# Patient Record
Sex: Female | Born: 1955 | Hispanic: No | Marital: Married | Smoking: Current every day smoker
Health system: Southern US, Community
[De-identification: ages and names within clinical notes are randomized; demographics above are authoritative.]

## PROBLEM LIST (undated history)

## (undated) DIAGNOSIS — K219 Gastro-esophageal reflux disease without esophagitis: Secondary | ICD-10-CM

## (undated) DIAGNOSIS — F32A Depression, unspecified: Secondary | ICD-10-CM

## (undated) DIAGNOSIS — D219 Benign neoplasm of connective and other soft tissue, unspecified: Secondary | ICD-10-CM

## (undated) DIAGNOSIS — F329 Major depressive disorder, single episode, unspecified: Secondary | ICD-10-CM

## (undated) DIAGNOSIS — C50919 Malignant neoplasm of unspecified site of unspecified female breast: Secondary | ICD-10-CM

## (undated) DIAGNOSIS — F419 Anxiety disorder, unspecified: Secondary | ICD-10-CM

## (undated) DIAGNOSIS — F17200 Nicotine dependence, unspecified, uncomplicated: Secondary | ICD-10-CM

## (undated) DIAGNOSIS — R112 Nausea with vomiting, unspecified: Secondary | ICD-10-CM

## (undated) DIAGNOSIS — Z9889 Other specified postprocedural states: Secondary | ICD-10-CM

## (undated) HISTORY — DX: Depression, unspecified: F32.A

## (undated) HISTORY — PX: MASTECTOMY: SHX3

## (undated) HISTORY — DX: Nicotine dependence, unspecified, uncomplicated: F17.200

## (undated) HISTORY — DX: Benign neoplasm of connective and other soft tissue, unspecified: D21.9

## (undated) HISTORY — DX: Major depressive disorder, single episode, unspecified: F32.9

---

## 1997-12-05 ENCOUNTER — Other Ambulatory Visit: Admission: RE | Admit: 1997-12-05 | Discharge: 1997-12-05 | Payer: Self-pay | Admitting: Obstetrics and Gynecology

## 1999-09-20 ENCOUNTER — Other Ambulatory Visit: Admission: RE | Admit: 1999-09-20 | Discharge: 1999-09-20 | Payer: Self-pay | Admitting: Obstetrics and Gynecology

## 1999-10-19 ENCOUNTER — Other Ambulatory Visit: Admission: RE | Admit: 1999-10-19 | Discharge: 1999-10-19 | Payer: Self-pay | Admitting: Obstetrics and Gynecology

## 2001-01-24 ENCOUNTER — Other Ambulatory Visit: Admission: RE | Admit: 2001-01-24 | Discharge: 2001-01-24 | Payer: Self-pay | Admitting: Obstetrics and Gynecology

## 2002-02-27 ENCOUNTER — Other Ambulatory Visit: Admission: RE | Admit: 2002-02-27 | Discharge: 2002-02-27 | Payer: Self-pay | Admitting: Obstetrics and Gynecology

## 2003-03-10 ENCOUNTER — Other Ambulatory Visit: Admission: RE | Admit: 2003-03-10 | Discharge: 2003-03-10 | Payer: Self-pay | Admitting: Obstetrics and Gynecology

## 2003-11-06 ENCOUNTER — Ambulatory Visit: Payer: Self-pay | Admitting: Professional

## 2003-12-04 ENCOUNTER — Ambulatory Visit: Payer: Self-pay | Admitting: Professional

## 2003-12-18 ENCOUNTER — Ambulatory Visit: Payer: Self-pay | Admitting: Professional

## 2004-01-08 ENCOUNTER — Ambulatory Visit: Payer: Self-pay | Admitting: Professional

## 2004-01-22 ENCOUNTER — Ambulatory Visit: Payer: Self-pay | Admitting: Professional

## 2004-02-05 ENCOUNTER — Ambulatory Visit: Payer: Self-pay | Admitting: Professional

## 2004-04-28 ENCOUNTER — Other Ambulatory Visit: Admission: RE | Admit: 2004-04-28 | Discharge: 2004-04-28 | Payer: Self-pay | Admitting: Obstetrics and Gynecology

## 2005-05-18 ENCOUNTER — Other Ambulatory Visit: Admission: RE | Admit: 2005-05-18 | Discharge: 2005-05-18 | Payer: Self-pay | Admitting: Obstetrics and Gynecology

## 2006-06-20 ENCOUNTER — Other Ambulatory Visit: Admission: RE | Admit: 2006-06-20 | Discharge: 2006-06-20 | Payer: Self-pay | Admitting: Obstetrics and Gynecology

## 2007-09-04 ENCOUNTER — Other Ambulatory Visit: Admission: RE | Admit: 2007-09-04 | Discharge: 2007-09-04 | Payer: Self-pay | Admitting: Obstetrics and Gynecology

## 2007-09-04 ENCOUNTER — Encounter: Payer: Self-pay | Admitting: Obstetrics and Gynecology

## 2007-09-04 ENCOUNTER — Ambulatory Visit: Payer: Self-pay | Admitting: Obstetrics and Gynecology

## 2007-12-19 ENCOUNTER — Ambulatory Visit: Payer: Self-pay | Admitting: Obstetrics and Gynecology

## 2008-11-13 ENCOUNTER — Ambulatory Visit: Payer: Self-pay | Admitting: Obstetrics and Gynecology

## 2008-11-13 ENCOUNTER — Encounter: Payer: Self-pay | Admitting: Obstetrics and Gynecology

## 2008-11-13 ENCOUNTER — Other Ambulatory Visit: Admission: RE | Admit: 2008-11-13 | Discharge: 2008-11-13 | Payer: Self-pay | Admitting: Obstetrics and Gynecology

## 2010-01-27 ENCOUNTER — Other Ambulatory Visit
Admission: RE | Admit: 2010-01-27 | Discharge: 2010-01-27 | Payer: Self-pay | Source: Home / Self Care | Admitting: Obstetrics and Gynecology

## 2010-01-27 ENCOUNTER — Other Ambulatory Visit: Payer: Self-pay | Admitting: Obstetrics and Gynecology

## 2010-01-27 ENCOUNTER — Ambulatory Visit
Admission: RE | Admit: 2010-01-27 | Discharge: 2010-01-27 | Payer: Self-pay | Source: Home / Self Care | Attending: Obstetrics and Gynecology | Admitting: Obstetrics and Gynecology

## 2010-02-03 ENCOUNTER — Other Ambulatory Visit: Payer: Self-pay

## 2010-02-09 ENCOUNTER — Other Ambulatory Visit: Payer: Self-pay

## 2010-02-12 ENCOUNTER — Other Ambulatory Visit: Payer: PRIVATE HEALTH INSURANCE

## 2010-02-12 DIAGNOSIS — R7309 Other abnormal glucose: Secondary | ICD-10-CM

## 2010-03-03 ENCOUNTER — Other Ambulatory Visit: Payer: Self-pay | Admitting: Obstetrics and Gynecology

## 2010-03-03 DIAGNOSIS — Z1231 Encounter for screening mammogram for malignant neoplasm of breast: Secondary | ICD-10-CM

## 2010-03-10 ENCOUNTER — Encounter (INDEPENDENT_AMBULATORY_CARE_PROVIDER_SITE_OTHER): Payer: PRIVATE HEALTH INSURANCE

## 2010-03-10 ENCOUNTER — Ambulatory Visit (HOSPITAL_COMMUNITY)
Admission: RE | Admit: 2010-03-10 | Discharge: 2010-03-10 | Disposition: A | Discharge status: Home / Self Care | Payer: PRIVATE HEALTH INSURANCE | Source: Ambulatory Visit | Attending: Obstetrics and Gynecology | Admitting: Obstetrics and Gynecology

## 2010-03-10 DIAGNOSIS — Z13820 Encounter for screening for osteoporosis: Secondary | ICD-10-CM

## 2010-03-10 DIAGNOSIS — Z1231 Encounter for screening mammogram for malignant neoplasm of breast: Secondary | ICD-10-CM | POA: Insufficient documentation

## 2010-12-30 ENCOUNTER — Telehealth: Payer: Self-pay | Admitting: *Deleted

## 2010-12-30 NOTE — Telephone Encounter (Signed)
prestiq approved till 09/18/13

## 2011-02-22 DIAGNOSIS — F17200 Nicotine dependence, unspecified, uncomplicated: Secondary | ICD-10-CM | POA: Insufficient documentation

## 2011-02-22 DIAGNOSIS — D219 Benign neoplasm of connective and other soft tissue, unspecified: Secondary | ICD-10-CM | POA: Insufficient documentation

## 2011-02-22 DIAGNOSIS — F329 Major depressive disorder, single episode, unspecified: Secondary | ICD-10-CM | POA: Insufficient documentation

## 2011-02-22 DIAGNOSIS — F32A Depression, unspecified: Secondary | ICD-10-CM | POA: Insufficient documentation

## 2011-02-27 ENCOUNTER — Other Ambulatory Visit: Payer: Self-pay | Admitting: Obstetrics and Gynecology

## 2011-02-28 NOTE — Telephone Encounter (Signed)
Has CE scheduled 03/02/11 

## 2011-03-02 ENCOUNTER — Ambulatory Visit (INDEPENDENT_AMBULATORY_CARE_PROVIDER_SITE_OTHER): Payer: BC Managed Care – PPO | Admitting: Obstetrics and Gynecology

## 2011-03-02 ENCOUNTER — Encounter: Payer: Self-pay | Admitting: Obstetrics and Gynecology

## 2011-03-02 ENCOUNTER — Other Ambulatory Visit (HOSPITAL_COMMUNITY)
Admission: RE | Admit: 2011-03-02 | Discharge: 2011-03-02 | Disposition: A | Discharge status: Home / Self Care | Payer: BC Managed Care – PPO | Source: Ambulatory Visit | Attending: Obstetrics and Gynecology | Admitting: Obstetrics and Gynecology

## 2011-03-02 VITALS — BP 120/76 | Ht 66.5 in | Wt 226.0 lb

## 2011-03-02 DIAGNOSIS — N898 Other specified noninflammatory disorders of vagina: Secondary | ICD-10-CM

## 2011-03-02 DIAGNOSIS — Z01419 Encounter for gynecological examination (general) (routine) without abnormal findings: Secondary | ICD-10-CM

## 2011-03-02 DIAGNOSIS — Z833 Family history of diabetes mellitus: Secondary | ICD-10-CM

## 2011-03-02 DIAGNOSIS — Z30431 Encounter for routine checking of intrauterine contraceptive device: Secondary | ICD-10-CM

## 2011-03-02 DIAGNOSIS — E7800 Pure hypercholesterolemia, unspecified: Secondary | ICD-10-CM

## 2011-03-02 DIAGNOSIS — L293 Anogenital pruritus, unspecified: Secondary | ICD-10-CM

## 2011-03-02 LAB — WET PREP FOR TRICH, YEAST, CLUE
Trich, Wet Prep: NONE SEEN
Yeast Wet Prep HPF POC: NONE SEEN

## 2011-03-02 MED ORDER — DESVENLAFAXINE SUCCINATE ER 50 MG PO TB24: 50.0000 mg | ORAL_TABLET | Freq: Every day | ORAL | Status: DC

## 2011-03-02 MED ORDER — FLUCONAZOLE 150 MG PO TABS: 150.0000 mg | ORAL_TABLET | Freq: Once | ORAL | Status: AC

## 2011-03-02 MED ORDER — ESTRADIOL 1 MG PO TABS: 1.0000 mg | ORAL_TABLET | Freq: Every day | ORAL | Status: DC

## 2011-03-02 NOTE — Patient Instructions (Signed)
Return fasting her lab work.

## 2011-03-02 NOTE — Progress Notes (Signed)
Patient came to see me today for Sabrina Woods annual GYN exam. She has done well on Sabrina Woods hormone replacement without problems. She does not take progesterone because she has a Mirena IUD. She is having no cycles. Sabrina Woods IUD is due to be replaced in July. She is having no pelvic pain. Pristiq  is doing well for Sabrina Woods depression without any side effects. She is up-to-date on mammograms and bone densities. It is time to recheck Sabrina Woods cholesterol. She did not fast. Last year Sabrina Woods LDL was slightly elevated.  Physical examination: [name] present. HEENT within normal limits. Neck: Thyroid not large. No masses. Supraclavicular nodes: not enlarged. Breasts: Examined in both sitting midline position. No skin changes and no masses. Abdomen: Soft no guarding rebound or masses or hernia. Pelvic: External: Within normal limits. BUS: Within normal limits. Vaginal:within normal limits. Good estrogen effect. No evidence of cystocele rectocele or enterocele. Cervix: clean. IUD string visible. Uterus: Normal size and shape. Adnexa: No masses. Rectovaginal exam: Confirmatory and negative. Extremities: Within normal limits.  Wet prep negative but ooks like yeast.  Assessment: #1. Menopausal symptoms #2. Menorrhagia prior to IUD. #3. Yeast vaginitis #4. Depression  Plan: #1. Continue pristiq. #2. Continue estradiol #3. Get patient appproved for a new IUD #4. Diflucan 150 mg daily for 3 days. #4. Continue yearly mammograms #5. Return fasting for lab work.

## 2011-03-03 ENCOUNTER — Other Ambulatory Visit: Payer: Self-pay | Admitting: *Deleted

## 2011-03-03 ENCOUNTER — Telehealth: Payer: Self-pay | Admitting: *Deleted

## 2011-03-03 DIAGNOSIS — Z3049 Encounter for surveillance of other contraceptives: Secondary | ICD-10-CM

## 2011-03-03 LAB — URINALYSIS W MICROSCOPIC + REFLEX CULTURE
Bacteria, UA: NONE SEEN
Bilirubin Urine: NEGATIVE
Casts: NONE SEEN
Glucose, UA: NEGATIVE mg/dL
Hgb urine dipstick: NEGATIVE
Ketones, ur: NEGATIVE mg/dL
Protein, ur: NEGATIVE mg/dL
RBC / HPF: NONE SEEN RBC/hpf (ref <3)
WBC, UA: NONE SEEN WBC/hpf (ref <3)
pH: 6.5 (ref 5.0–8.0)

## 2011-03-03 MED ORDER — LEVONORGESTREL 20 MCG/24HR IU IUD: INTRAUTERINE_SYSTEM | Freq: Once | INTRAUTERINE | Status: DC

## 2011-03-03 NOTE — Telephone Encounter (Signed)
Lm for patient to call.  Benefits Mirena $25 copay

## 2011-03-03 NOTE — Telephone Encounter (Signed)
Patient informed, will schedule

## 2011-03-03 NOTE — Telephone Encounter (Signed)
Message copied by Libby Maw on Thu Mar 03, 2011  9:31 AM ------      Message from: Trellis Paganini      Created: Wed Mar 02, 2011  4:10 PM       Mirena IUD      ----- Message -----         From: Otto Herb, CNA         Sent: 03/02/2011   3:31 PM           To: Trellis Paganini, MD            Dr Reece Agar, you didn't state which IUD.  I see she has been on Mirena.  Would you like to stay on Mirena?      ----- Message -----         From: Trellis Paganini, MD         Sent: 03/02/2011   2:53 PM           To: Blanca Carreon Duwaine Maxin, CNA            Patient has an IUD that she uses for menorrhagia. It is time to change it. Please get her approved

## 2011-03-09 ENCOUNTER — Other Ambulatory Visit: Payer: BC Managed Care – PPO

## 2011-03-09 DIAGNOSIS — Z833 Family history of diabetes mellitus: Secondary | ICD-10-CM

## 2011-03-09 DIAGNOSIS — E7800 Pure hypercholesterolemia, unspecified: Secondary | ICD-10-CM

## 2011-03-09 DIAGNOSIS — Z01419 Encounter for gynecological examination (general) (routine) without abnormal findings: Secondary | ICD-10-CM

## 2011-03-09 LAB — CBC WITH DIFFERENTIAL/PLATELET
Eosinophils Relative: 2 % (ref 0–5)
HCT: 44 % (ref 36.0–46.0)
Hemoglobin: 14.2 g/dL (ref 12.0–15.0)
Lymphocytes Relative: 25 % (ref 12–46)
Lymphs Abs: 2.2 10*3/uL (ref 0.7–4.0)
MCV: 93 fL (ref 78.0–100.0)
Monocytes Absolute: 0.6 10*3/uL (ref 0.1–1.0)
RBC: 4.73 MIL/uL (ref 3.87–5.11)
WBC: 8.9 10*3/uL (ref 4.0–10.5)

## 2011-03-10 ENCOUNTER — Ambulatory Visit (INDEPENDENT_AMBULATORY_CARE_PROVIDER_SITE_OTHER): Payer: BC Managed Care – PPO | Admitting: Obstetrics and Gynecology

## 2011-03-10 DIAGNOSIS — E7800 Pure hypercholesterolemia, unspecified: Secondary | ICD-10-CM

## 2011-03-10 DIAGNOSIS — Z3049 Encounter for surveillance of other contraceptives: Secondary | ICD-10-CM

## 2011-03-10 DIAGNOSIS — N951 Menopausal and female climacteric states: Secondary | ICD-10-CM

## 2011-03-10 DIAGNOSIS — Z780 Asymptomatic menopausal state: Secondary | ICD-10-CM

## 2011-03-10 DIAGNOSIS — Z30432 Encounter for removal of intrauterine contraceptive device: Secondary | ICD-10-CM

## 2011-03-10 MED ORDER — MEDROXYPROGESTERONE ACETATE 5 MG PO TABS: ORAL_TABLET | ORAL | Status: DC

## 2011-03-10 NOTE — Progress Notes (Signed)
Patient came to see me today to have her old IUD removed and a new one placed. She does not feel she is getting adequate relief of her vasomotor symptoms with 1 mg of estradiol daily.  Pelvic exam: External: Within normal limits. BUS within normal limits. Vaginal exam: Within normal limits. Cervix: Clean. IUD string not visible. Os closed. Uterus: Within normal limits. Adnexa: Within normal limits. Rectovaginal exam: Within normal limits. Olegario Shearer present.  Assessment: Menopausal symptoms. Elevated cholesterol. Borderline hemoglobin A1c.  Plan: We could not see her IUD string. I could not probe her os for the string due to cervical stenosis. A paracervical block was placed with 20 cc 1% plain Xylocaine. An excellent paracervical was obtained. I was then able to dilate her cervix. I found the IUD string high in the cervical canal. I removed her IUD without difficulty. At this point she elected not to have a new one placed. She will do cyclical Provera. We'll also increased her estradiol to  11/2 mg daily. If symptoms persist she will return for serum estradiol level. She is aware of the slightly higher risk of breast cancer with long-term use of systemic progestin. We also discussed her cholesterol and hemoglobin A1c results. See notes attached to lab. She will continue to work on low cholesterol low sugar diet. We will recheck her hemoglobin A 1C in 6 months.

## 2011-03-16 ENCOUNTER — Other Ambulatory Visit: Payer: Self-pay

## 2011-03-16 DIAGNOSIS — R7309 Other abnormal glucose: Secondary | ICD-10-CM

## 2011-03-22 ENCOUNTER — Ambulatory Visit: Payer: BC Managed Care – PPO | Admitting: Obstetrics and Gynecology

## 2012-03-10 ENCOUNTER — Other Ambulatory Visit: Payer: Self-pay | Admitting: Obstetrics and Gynecology

## 2013-11-04 ENCOUNTER — Encounter: Payer: Self-pay | Admitting: Obstetrics and Gynecology

## 2020-01-04 DIAGNOSIS — C50919 Malignant neoplasm of unspecified site of unspecified female breast: Secondary | ICD-10-CM

## 2020-01-04 HISTORY — DX: Malignant neoplasm of unspecified site of unspecified female breast: C50.919

## 2020-02-20 ENCOUNTER — Other Ambulatory Visit: Payer: Self-pay | Admitting: Obstetrics and Gynecology

## 2020-02-20 DIAGNOSIS — N6310 Unspecified lump in the right breast, unspecified quadrant: Secondary | ICD-10-CM

## 2020-02-25 ENCOUNTER — Other Ambulatory Visit: Payer: Self-pay | Admitting: Obstetrics and Gynecology

## 2020-02-25 ENCOUNTER — Ambulatory Visit
Admission: RE | Admit: 2020-02-25 | Discharge: 2020-02-25 | Disposition: A | Discharge status: Home / Self Care | Payer: Managed Care, Other (non HMO) | Source: Ambulatory Visit | Attending: Obstetrics and Gynecology | Admitting: Obstetrics and Gynecology

## 2020-02-25 ENCOUNTER — Other Ambulatory Visit: Payer: Self-pay

## 2020-02-25 DIAGNOSIS — N6320 Unspecified lump in the left breast, unspecified quadrant: Secondary | ICD-10-CM

## 2020-02-25 DIAGNOSIS — N6310 Unspecified lump in the right breast, unspecified quadrant: Secondary | ICD-10-CM

## 2020-03-02 ENCOUNTER — Other Ambulatory Visit: Payer: Managed Care, Other (non HMO)

## 2020-03-04 ENCOUNTER — Other Ambulatory Visit: Payer: Self-pay

## 2020-03-04 ENCOUNTER — Ambulatory Visit
Admission: RE | Admit: 2020-03-04 | Discharge: 2020-03-04 | Disposition: A | Discharge status: Home / Self Care | Payer: Managed Care, Other (non HMO) | Source: Ambulatory Visit | Attending: Obstetrics and Gynecology | Admitting: Obstetrics and Gynecology

## 2020-03-04 DIAGNOSIS — N6310 Unspecified lump in the right breast, unspecified quadrant: Secondary | ICD-10-CM

## 2020-03-05 ENCOUNTER — Ambulatory Visit
Admission: RE | Admit: 2020-03-05 | Discharge: 2020-03-05 | Disposition: A | Discharge status: Home / Self Care | Payer: Managed Care, Other (non HMO) | Source: Ambulatory Visit | Attending: Obstetrics and Gynecology | Admitting: Obstetrics and Gynecology

## 2020-03-05 ENCOUNTER — Telehealth: Payer: Self-pay | Admitting: Hematology and Oncology

## 2020-03-05 ENCOUNTER — Other Ambulatory Visit: Payer: Self-pay

## 2020-03-05 DIAGNOSIS — N6310 Unspecified lump in the right breast, unspecified quadrant: Secondary | ICD-10-CM

## 2020-03-05 NOTE — Telephone Encounter (Signed)
Received a new pt referral from [facility] for new dx of breast cancer. Mrs. Sabrina Woods has been cld and scheduled to see Dr. Lindi Adie on 3/8 at 1pm. Pt aware to arrive 20 minutes early.

## 2020-03-09 NOTE — Progress Notes (Signed)
[facility] CONSULT NOTE  Patient Care Team: Bennetta Laos, MD (Inactive) as PCP - General (Obstetrics and Gynecology) Mauro Kaufmann, RN as Oncology Nurse Navigator Rockwell Germany, RN as Oncology Nurse Navigator  CHIEF COMPLAINTS/PURPOSE OF CONSULTATION:  Newly diagnosed breast cancer  HISTORY OF PRESENTING ILLNESS:  Sabrina Woods 65 y.o. female is here because of recent diagnosis of invasive ductal carcinoma of the right breast. Patient palpated a right breast mass for 6 months. Diagnostic mammogram and [place] on 02/25/20 showed a small group of cysts in the left breast at the 3:30 position, 1.2cm, a 3.0cm mass at the 3:00 position favoring a dilated duct, and in the right breast, a 11.0cm mass in the upper outer quadrant and two masses, 2.6cm each, in the right axilla. 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 She presents to the clinic today for initial evaluation and discussion of treatment options.   I reviewed her records extensively and collaborated the history with the patient.  SUMMARY OF ONCOLOGIC HISTORY: Oncology History  Malignant neoplasm of upper-outer quadrant of right breast in female, estrogen receptor positive ([place])  03/10/2020 Initial Diagnosis   Palpable right breast mass x6 months.  Mammogram revealed left breast cysts (intraductal papilloma), 11 cm right breast mass and 2 masses 2.6 cm each in the right axilla: Biopsy grade 3 IDC ER 5% weak, PR 0%, Ki-67 25%, HER-2 3+ IHC positive   03/10/2020 Cancer Staging   Staging form: Breast, AJCC 8th Edition - Clinical stage from 03/10/2020: Stage IIIA (cT3, cN1, cM0, G3, ER+, PR-, HER2+) - Signed by [name], MD on 03/10/2020 Stage prefix: Initial diagnosis     MEDICAL HISTORY:  Past Medical History:  Diagnosis Date  . Depression     HISTORY OF  . Fibroid   . Smoker     SURGICAL HISTORY: Past Surgical History:  Procedure Laterality Date  . CESAREAN SECTION     X 2    SOCIAL HISTORY: Social History   Socioeconomic History  . Marital status: Single    Spouse name: Not on file  . Number of children: Not on file  . Years of education: Not on file  . Highest education level: Not on file  Occupational History  . Not on file  Tobacco Use  . Smoking status: Current Every Day Smoker    Packs/day: 0.50    Types: Cigarettes  . Smokeless tobacco: Not on file  Substance and Sexual Activity  . Alcohol use: Yes    Comment: rare  . Drug use: Not on file  . Sexual activity: Yes    Birth control/protection: Post-menopausal, I.U.D.    Comment: Inserted 07-27-06  Other Topics Concern  . Not on file  Social History Narrative  . Not on file   Social Determinants of Health   Financial Resource Strain: Not on file  Food Insecurity: Not on file  Transportation Needs: Not on file  Physical Activity: Not on file  Stress: Not on file  Social Connections: Not on file  Intimate Partner Violence: Not on file    FAMILY HISTORY: Family History  Problem Relation Age of Onset  . Breast cancer Mother   . Diabetes Mother   . Hypertension Mother   . Hypertension Father   . Hypertension Sister   . Hypertension Brother  ALLERGIES:  has No Known Allergies.  MEDICATIONS:  Current Outpatient Medications  Medication Sig Dispense Refill  . desvenlafaxine (PRISTIQ) 50 MG 24 hr tablet Take 1 tablet (50 mg total) by mouth daily. 30 tablet 12  . DULoxetine HCl (CYMBALTA PO) Take by mouth.    . estradiol (ESTRACE) 1 MG tablet TAKE ONE TABLET BY MOUTH ONE TIME DAILY 30 tablet 0  . levonorgestrel (MIRENA) 20 MCG/24HR IUD 1 each by Intrauterine route once.    . medroxyPROGESTERone (PROVERA) 5 MG tablet 1 tablet daily for 12 days of month 12 tablet 12  . Multiple Vitamin (MULTIVITAMIN) capsule Take 1 capsule by mouth daily.     . Pseudoephedrine HCl (SUDAFED PO) Take by mouth.     Current Facility-Administered Medications  Medication Dose Route Frequency Provider Last Rate Last Admin  . levonorgestrel (MIRENA) 20 MCG/24HR IUD   Intrauterine Once Gottsegen, Cherly Anderson, MD        REVIEW OF SYSTEMS:   Constitutional: Denies fevers, chills or abnormal night sweats    Breast: palpable right breast mass All other systems were reviewed with the patient and are negative.  PHYSICAL EXAMINATION: ECOG PERFORMANCE STATUS: 1 - Symptomatic but completely ambulatory  There were no vitals filed for this visit. There were no vitals filed for this visit.    LABORATORY DATA:  I have reviewed the data as listed Lab Results  Component Value Date   WBC 8.9 03/09/2011   HGB 14.2 03/09/2011   HCT 44.0 03/09/2011   MCV 93.0 03/09/2011   PLT 367 03/09/2011   No results found for: NA, K, CL, CO2  RADIOGRAPHIC STUDIES: I have personally reviewed the radiological reports and agreed with the findings in the report.  ASSESSMENT AND PLAN:  Malignant neoplasm of upper-outer quadrant of right breast in female, estrogen receptor positive ([place]) 03/04/2020:Palpable right breast mass x6 months.  Mammogram revealed left breast cysts (intraductal papilloma), 11 cm right breast mass and 2 masses 2.6 cm each in the right axilla: Biopsy grade 3 IDC ER 5% weak, PR 0%, Ki-67 25%, HER-2 3+ IHC positive  Pathology and radiology counseling: Discussed with the patient, the details of pathology including the type of breast cancer,the clinical staging, the significance of ER, PR and HER-2/neu receptors and the implications for treatment. After reviewing the pathology in detail, we proceeded to discuss the different treatment options between surgery, radiation, chemotherapy, antiestrogen therapies.  Recommendation based on multidisciplinary tumor board: 1. Neoadjuvant chemotherapy with TCH Perjeta 6 cycles followed by Herceptin Perjeta maintenance  versus Kadcyla maintenance (based on response to neoadjuvant chemo) for 1 year 2. Followed by mastectomy with sentinel lymph node study 3. Followed by adjuvant radiation therapy 4.  Followed by antiestrogen therapy 5.  Consideration for neratinib  Chemotherapy Counseling: I discussed the risks and benefits of chemotherapy including the risks of nausea/ vomiting, risk of infection from low WBC count, fatigue due to chemo or anemia, bruising or bleeding due to low platelets, mouth sores, loss/ change in taste and decreased appetite. Liver and kidney function will be monitored through out chemotherapy as abnormalities in liver and kidney function may be a side effect of treatment. Cardiac dysfunction due to Herceptin and Perjeta and neuropathy risk from Taxotere were discussed in detail. Risk of permanent bone marrow dysfunction due to chemo were also discussed.  Plan: 1. Port placement 2. Echocardiogram 3. Chemotherapy class 4. Breast MRI 5.  Staging CT scans and bone scan  Return to clinic in 2 weeks to start  chemotherapy.     All questions were answered. The patient knows to call the clinic with any problems, questions or concerns.   Rulon Eisenmenger, MD, MPH 03/10/2020    I, Molly Dorshimer, am acting as scribe for [name], MD.  I have reviewed the above documentation for accuracy and completeness, and I agree with the above.

## 2020-03-10 ENCOUNTER — Other Ambulatory Visit: Payer: Self-pay | Admitting: General Surgery

## 2020-03-10 ENCOUNTER — Other Ambulatory Visit: Payer: Self-pay

## 2020-03-10 ENCOUNTER — Encounter: Payer: Self-pay | Admitting: *Deleted

## 2020-03-10 ENCOUNTER — Other Ambulatory Visit: Payer: Self-pay | Admitting: Obstetrics and Gynecology

## 2020-03-10 ENCOUNTER — Ambulatory Visit
Admission: RE | Admit: 2020-03-10 | Discharge: 2020-03-10 | Disposition: A | Discharge status: Home / Self Care | Payer: Managed Care, Other (non HMO) | Source: Ambulatory Visit | Attending: Obstetrics and Gynecology | Admitting: Obstetrics and Gynecology

## 2020-03-10 ENCOUNTER — Inpatient Hospital Stay: Payer: Managed Care, Other (non HMO) | Attending: Hematology and Oncology | Admitting: Hematology and Oncology

## 2020-03-10 VITALS — BP 182/89 | HR 89 | Temp 97.7°F | Resp 18 | Ht 66.5 in | Wt 207.3 lb

## 2020-03-10 DIAGNOSIS — N6320 Unspecified lump in the left breast, unspecified quadrant: Secondary | ICD-10-CM

## 2020-03-10 DIAGNOSIS — Z5111 Encounter for antineoplastic chemotherapy: Secondary | ICD-10-CM | POA: Insufficient documentation

## 2020-03-10 DIAGNOSIS — C50411 Malignant neoplasm of upper-outer quadrant of right female breast: Secondary | ICD-10-CM

## 2020-03-10 DIAGNOSIS — Z170 Estrogen receptor positive status [ER+]: Secondary | ICD-10-CM

## 2020-03-10 DIAGNOSIS — Z5112 Encounter for antineoplastic immunotherapy: Secondary | ICD-10-CM | POA: Diagnosis not present

## 2020-03-10 MED ORDER — NICOTINE 7 MG/24HR TD PT24: 7.0000 mg | MEDICATED_PATCH | Freq: Every day | TRANSDERMAL | 0 refills | Status: DC

## 2020-03-10 MED ORDER — PROCHLORPERAZINE MALEATE 10 MG PO TABS: 10.0000 mg | ORAL_TABLET | Freq: Four times a day (QID) | ORAL | 1 refills | Status: DC | PRN

## 2020-03-10 MED ORDER — DEXAMETHASONE 4 MG PO TABS: 4.0000 mg | ORAL_TABLET | Freq: Every day | ORAL | 1 refills | Status: DC

## 2020-03-10 MED ORDER — VENLAFAXINE HCL ER 37.5 MG PO CP24: 37.5000 mg | ORAL_CAPSULE | Freq: Every day | ORAL | 6 refills | Status: DC

## 2020-03-10 MED ORDER — ALPRAZOLAM 0.25 MG PO TABS: 0.2500 mg | ORAL_TABLET | Freq: Two times a day (BID) | ORAL | 3 refills | Status: DC | PRN

## 2020-03-10 MED ORDER — LIDOCAINE-PRILOCAINE 2.5-2.5 % EX CREA: TOPICAL_CREAM | CUTANEOUS | 3 refills | Status: DC

## 2020-03-10 MED ORDER — ONDANSETRON HCL 8 MG PO TABS: 8.0000 mg | ORAL_TABLET | Freq: Two times a day (BID) | ORAL | 1 refills | Status: DC | PRN

## 2020-03-10 MED ORDER — NICOTINE 14 MG/24HR TD PT24: 14.0000 mg | MEDICATED_PATCH | Freq: Every day | TRANSDERMAL | 0 refills | Status: DC

## 2020-03-10 NOTE — Progress Notes (Signed)
START ON PATHWAY REGIMEN - Breast     A cycle is every 21 days:     Pertuzumab      Pertuzumab      Trastuzumab-xxxx      Trastuzumab-xxxx      Carboplatin      Docetaxel   **Always confirm dose/schedule in your pharmacy ordering system**  Patient Characteristics: Preoperative or Nonsurgical Candidate (Clinical Staging), Neoadjuvant Therapy followed by Surgery, Invasive Disease, Chemotherapy, HER2 Positive, ER Positive Therapeutic Status: Preoperative or Nonsurgical Candidate (Clinical Staging) AJCC M Category: cM0 AJCC Grade: G3 Breast Surgical Plan: Neoadjuvant Therapy followed by Surgery ER Status: Positive (+) AJCC 8 Stage Grouping: IIIA HER2 Status: Positive (+) AJCC T Category: cT3 AJCC N Category: cN1 PR Status: Negative (-) Intent of Therapy: Curative Intent, Discussed with Patient 

## 2020-03-10 NOTE — Assessment & Plan Note (Signed)
03/04/2020:Palpable right breast mass x6 months.  Mammogram revealed left breast cysts (intraductal papilloma), 11 cm right breast mass and 2 masses 2.6 cm each in the right axilla: Biopsy grade 3 IDC ER 5% weak, PR 0%, Ki-67 25%, HER-2 3+ IHC positive  Pathology and radiology counseling: Discussed with the patient, the details of pathology including the type of breast cancer,the clinical staging, the significance of ER, PR and HER-2/neu receptors and the implications for treatment. After reviewing the pathology in detail, we proceeded to discuss the different treatment options between surgery, radiation, chemotherapy, antiestrogen therapies.  Recommendation based on multidisciplinary tumor board: 1. Neoadjuvant chemotherapy with TCH Perjeta 6 cycles followed by Herceptin Perjeta maintenance versus Kadcyla maintenance (based on response to neoadjuvant chemo) for 1 year 2. Followed by mastectomy with sentinel lymph node study 3. Followed by adjuvant radiation therapy 4.  Followed by antiestrogen therapy 5.  Consideration for neratinib  Chemotherapy Counseling: I discussed the risks and benefits of chemotherapy including the risks of nausea/ vomiting, risk of infection from low WBC count, fatigue due to chemo or anemia, bruising or bleeding due to low platelets, mouth sores, loss/ change in taste and decreased appetite. Liver and kidney function will be monitored through out chemotherapy as abnormalities in liver and kidney function may be a side effect of treatment. Cardiac dysfunction due to Herceptin and Perjeta and neuropathy risk from Taxotere were discussed in detail. Risk of permanent bone marrow dysfunction due to chemo were also discussed.  Plan: 1. Port placement 2. Echocardiogram 3. Chemotherapy class 4. Breast MRI 5. URCC nausea study  Return to clinic in 2 weeks to start chemotherapy.

## 2020-03-11 ENCOUNTER — Telehealth: Payer: Self-pay

## 2020-03-11 ENCOUNTER — Telehealth: Payer: Self-pay | Admitting: Hematology and Oncology

## 2020-03-11 NOTE — Telephone Encounter (Signed)
Scheduled follow-up appointments per 3/8 schedule message. Patient is aware.

## 2020-03-11 NOTE — Telephone Encounter (Signed)
RN spoke with patient and updated on new appointment time and dates for CT scan and start of chemotherapy.  Patient verbalized understanding, no further needs at this time.

## 2020-03-12 ENCOUNTER — Telehealth: Payer: Self-pay | Admitting: *Deleted

## 2020-03-12 ENCOUNTER — Encounter: Payer: Self-pay | Admitting: *Deleted

## 2020-03-12 ENCOUNTER — Other Ambulatory Visit: Payer: Self-pay

## 2020-03-12 ENCOUNTER — Ambulatory Visit
Admission: RE | Admit: 2020-03-12 | Discharge: 2020-03-12 | Disposition: A | Discharge status: Home / Self Care | Payer: Managed Care, Other (non HMO) | Source: Ambulatory Visit | Attending: Hematology and Oncology | Admitting: Hematology and Oncology

## 2020-03-12 ENCOUNTER — Telehealth: Payer: Self-pay | Admitting: Hematology and Oncology

## 2020-03-12 ENCOUNTER — Encounter (HOSPITAL_BASED_OUTPATIENT_CLINIC_OR_DEPARTMENT_OTHER): Payer: Self-pay | Admitting: General Surgery

## 2020-03-12 ENCOUNTER — Inpatient Hospital Stay: Payer: Managed Care, Other (non HMO)

## 2020-03-12 ENCOUNTER — Other Ambulatory Visit: Payer: Self-pay | Admitting: *Deleted

## 2020-03-12 DIAGNOSIS — Z170 Estrogen receptor positive status [ER+]: Secondary | ICD-10-CM

## 2020-03-12 DIAGNOSIS — C50411 Malignant neoplasm of upper-outer quadrant of right female breast: Secondary | ICD-10-CM

## 2020-03-12 DIAGNOSIS — Z5112 Encounter for antineoplastic immunotherapy: Secondary | ICD-10-CM | POA: Diagnosis not present

## 2020-03-12 LAB — CMP (CANCER CENTER ONLY)
ALT: 18 U/L (ref 0–44)
AST: 14 U/L — ABNORMAL LOW (ref 15–41)
Albumin: 3.8 g/dL (ref 3.5–5.0)
Alkaline Phosphatase: 76 U/L (ref 38–126)
Anion gap: 7 (ref 5–15)
BUN: 7 mg/dL — ABNORMAL LOW (ref 8–23)
CO2: 22 mmol/L (ref 22–32)
Calcium: 9.2 mg/dL (ref 8.9–10.3)
Chloride: 105 mmol/L (ref 98–111)
Creatinine: 0.81 mg/dL (ref 0.44–1.00)
GFR, Estimated: >60 mL/min (ref >60)
Glucose, Bld: 319 mg/dL — ABNORMAL HIGH (ref 70–99)
Potassium: 4 mmol/L (ref 3.5–5.1)
Sodium: 134 mmol/L — ABNORMAL LOW (ref 135–145)
Total Bilirubin: 0.3 mg/dL (ref 0.3–1.2)
Total Protein: 7 g/dL (ref 6.5–8.1)

## 2020-03-12 LAB — CBC WITH DIFFERENTIAL (CANCER CENTER ONLY)
Abs Immature Granulocytes: 0.02 10*3/uL (ref 0.00–0.07)
Basophils Absolute: 0.1 10*3/uL (ref 0.0–0.1)
Basophils Relative: 1 %
Eosinophils Absolute: 0.1 10*3/uL (ref 0.0–0.5)
Eosinophils Relative: 1 %
HCT: 40.4 % (ref 36.0–46.0)
Hemoglobin: 13.3 g/dL (ref 12.0–15.0)
Immature Granulocytes: 0 %
Lymphocytes Relative: 20 %
Lymphs Abs: 1.8 10*3/uL (ref 0.7–4.0)
MCH: 30 pg (ref 26.0–34.0)
MCHC: 32.9 g/dL (ref 30.0–36.0)
MCV: 91 fL (ref 80.0–100.0)
Monocytes Absolute: 0.6 10*3/uL (ref 0.1–1.0)
Monocytes Relative: 6 %
Neutro Abs: 6.6 10*3/uL (ref 1.7–7.7)
Neutrophils Relative %: 72 %
Platelet Count: 365 10*3/uL (ref 150–400)
RBC: 4.44 MIL/uL (ref 3.87–5.11)
RDW: 12.4 % (ref 11.5–15.5)
WBC Count: 9.3 10*3/uL (ref 4.0–10.5)
nRBC: 0 % (ref 0.0–0.2)

## 2020-03-12 MED ORDER — GADOBUTROL 1 MMOL/ML IV SOLN
10.0000 mL | Freq: Once | INTRAVENOUS | Status: AC | PRN
  Administered 2020-03-12: 10 mL via INTRAVENOUS

## 2020-03-12 MED ORDER — PEGFILGRASTIM INJECTION 6 MG/0.6ML ~~LOC~~: 6.0000 mg | PREFILLED_SYRINGE | Freq: Once | SUBCUTANEOUS | 4 refills | Status: AC

## 2020-03-12 NOTE — Telephone Encounter (Signed)
Spoke to pt regarding new scheduling of bone scan and echo. Confirmed new appts. Discussed contrast and when to drink 1st and 2nd bottle. No further needs voiced at this time.

## 2020-03-12 NOTE — Progress Notes (Signed)
Pharmacist Chemotherapy Monitoring - Initial Assessment    Anticipated start date: 03/19/20   Regimen:  . Are orders appropriate based on the patient's diagnosis, regimen, and cycle? Yes . Does the plan date match the patient's scheduled date? Yes . Is the sequencing of drugs appropriate? Yes . Are the premedications appropriate for the patient's regimen? Yes . Prior Authorization for treatment is: Pending o If applicable, is the correct biosimilar selected based on the patient's insurance? yes  Organ Function and Labs: Marland Kitchen Are dose adjustments needed based on the patient's renal function, hepatic function, or hematologic function? Yes . Are appropriate labs ordered prior to the start of patient's treatment? Yes . Other organ system assessment, if indicated: anthracyclines: Echo/ MUGA . The following baseline labs, if indicated, have been ordered: N/A  Dose Assessment: . Are the drug doses appropriate? Yes . Are the following correct: o Drug concentrations Yes o IV fluid compatible with drug Yes o Administration routes Yes o Timing of therapy Yes . If applicable, does the patient have documented access for treatment and/or plans for port-a-cath placement? yes . If applicable, have lifetime cumulative doses been properly documented and assessed? not applicable Lifetime Dose Tracking  No doses have been documented on this patient for the following tracked chemicals: Doxorubicin, Epirubicin, Idarubicin, Daunorubicin, Mitoxantrone, Bleomycin, Oxaliplatin, Carboplatin, Liposomal Doxorubicin  o   Toxicity Monitoring/Prevention: . The patient has the following take home antiemetics prescribed: Prochlorperazine . The patient has the following take home medications prescribed: N/A . Medication allergies and previous infusion related reactions, if applicable, have been reviewed and addressed. Yes . The patient's current medication list has been assessed for drug-drug interactions with their  chemotherapy regimen. no significant drug-drug interactions were identified on review.  Order Review: . Are the treatment plan orders signed? Yes . Is the patient scheduled to see a provider prior to their treatment? Yes  I verify that I have reviewed each item in the above checklist and answered each question accordingly.  Sabrina Woods, [place], 03/12/2020  9:35 AM

## 2020-03-12 NOTE — Telephone Encounter (Signed)
Scheduled appt per 3/10 sch msg. Pt aware.  

## 2020-03-12 NOTE — Progress Notes (Signed)
Received message from pharmacy stating pt insurance will only cover one Neulasta injection to be given in our office post chemotherapy.  Pt due to start tx on 03/19/20 and will receive injection either 03/20/20 or 03/21/20 in office.  The remainder of injections will need to be self administered at home by pt.  Verbal orders received from MD for pt to self administer Neulasta 6 mg subcu 24 hours after the completion of chemotherapy every 21 days x 5.  Pt will be due for home injections on 04/11/20, 05/23/20, 06/13/20, and 07/04/20.  Prescription sent to [facility] form filled out and successfully faxed for case management.

## 2020-03-13 ENCOUNTER — Other Ambulatory Visit: Payer: Self-pay | Admitting: *Deleted

## 2020-03-13 ENCOUNTER — Encounter (HOSPITAL_COMMUNITY)
Admission: RE | Admit: 2020-03-13 | Discharge: 2020-03-13 | Disposition: A | Discharge status: Home / Self Care | Payer: Managed Care, Other (non HMO) | Source: Ambulatory Visit | Attending: Hematology and Oncology | Admitting: Hematology and Oncology

## 2020-03-13 ENCOUNTER — Other Ambulatory Visit (HOSPITAL_COMMUNITY)
Admission: RE | Admit: 2020-03-13 | Discharge: 2020-03-13 | Disposition: A | Discharge status: Home / Self Care | Payer: Managed Care, Other (non HMO) | Source: Ambulatory Visit | Attending: General Surgery | Admitting: General Surgery

## 2020-03-13 DIAGNOSIS — Z20822 Contact with and (suspected) exposure to covid-19: Secondary | ICD-10-CM | POA: Diagnosis not present

## 2020-03-13 DIAGNOSIS — C50411 Malignant neoplasm of upper-outer quadrant of right female breast: Secondary | ICD-10-CM | POA: Insufficient documentation

## 2020-03-13 DIAGNOSIS — Z01812 Encounter for preprocedural laboratory examination: Secondary | ICD-10-CM | POA: Diagnosis present

## 2020-03-13 DIAGNOSIS — Z170 Estrogen receptor positive status [ER+]: Secondary | ICD-10-CM

## 2020-03-13 LAB — SARS CORONAVIRUS 2 (TAT 6-24 HRS): SARS Coronavirus 2: NEGATIVE

## 2020-03-13 MED ORDER — PEGFILGRASTIM-BMEZ 6 MG/0.6ML ~~LOC~~ SOSY: 6.0000 mg | PREFILLED_SYRINGE | Freq: Once | SUBCUTANEOUS | 5 refills | Status: AC

## 2020-03-13 MED ORDER — TECHNETIUM TC 99M MEDRONATE IV KIT
20.5000 | PACK | Freq: Once | INTRAVENOUS | Status: AC
  Administered 2020-03-13: 20.5 via INTRAVENOUS

## 2020-03-13 NOTE — Progress Notes (Signed)
Received message from pharmacy team stating pt has been approved to receive Ziextenzo 6 mg subcu x1 dose in our office.  New script for Ziextenzo sent to [facility] and cancel script for Neulasta 6 mg subcu.

## 2020-03-16 ENCOUNTER — Inpatient Hospital Stay: Payer: Managed Care, Other (non HMO)

## 2020-03-16 ENCOUNTER — Encounter: Payer: Self-pay | Admitting: *Deleted

## 2020-03-16 ENCOUNTER — Encounter: Payer: Self-pay | Admitting: Hematology and Oncology

## 2020-03-16 ENCOUNTER — Ambulatory Visit (HOSPITAL_BASED_OUTPATIENT_CLINIC_OR_DEPARTMENT_OTHER)
Admission: RE | Admit: 2020-03-16 | Discharge: 2020-03-16 | Disposition: A | Discharge status: Home / Self Care | Payer: Managed Care, Other (non HMO) | Source: Ambulatory Visit | Attending: Hematology and Oncology | Admitting: Hematology and Oncology

## 2020-03-16 ENCOUNTER — Ambulatory Visit (HOSPITAL_COMMUNITY)
Admission: RE | Admit: 2020-03-16 | Discharge: 2020-03-16 | Disposition: A | Discharge status: Home / Self Care | Payer: Managed Care, Other (non HMO) | Source: Ambulatory Visit | Attending: Hematology and Oncology | Admitting: Hematology and Oncology

## 2020-03-16 ENCOUNTER — Other Ambulatory Visit: Payer: Self-pay

## 2020-03-16 ENCOUNTER — Encounter (HOSPITAL_COMMUNITY): Payer: Self-pay

## 2020-03-16 DIAGNOSIS — Z01818 Encounter for other preprocedural examination: Secondary | ICD-10-CM | POA: Insufficient documentation

## 2020-03-16 DIAGNOSIS — I700 Atherosclerosis of aorta: Secondary | ICD-10-CM | POA: Insufficient documentation

## 2020-03-16 DIAGNOSIS — C50411 Malignant neoplasm of upper-outer quadrant of right female breast: Secondary | ICD-10-CM

## 2020-03-16 DIAGNOSIS — Z170 Estrogen receptor positive status [ER+]: Secondary | ICD-10-CM

## 2020-03-16 DIAGNOSIS — Z0189 Encounter for other specified special examinations: Secondary | ICD-10-CM | POA: Diagnosis not present

## 2020-03-16 DIAGNOSIS — F17200 Nicotine dependence, unspecified, uncomplicated: Secondary | ICD-10-CM | POA: Diagnosis not present

## 2020-03-16 DIAGNOSIS — K219 Gastro-esophageal reflux disease without esophagitis: Secondary | ICD-10-CM | POA: Insufficient documentation

## 2020-03-16 DIAGNOSIS — R918 Other nonspecific abnormal finding of lung field: Secondary | ICD-10-CM | POA: Insufficient documentation

## 2020-03-16 LAB — ECHOCARDIOGRAM COMPLETE
Area-P 1/2: 3.17 cm2
S' Lateral: 2.9 cm

## 2020-03-16 MED ORDER — IOHEXOL 300 MG/ML  SOLN
100.0000 mL | Freq: Once | INTRAMUSCULAR | Status: AC | PRN
  Administered 2020-03-16: 100 mL via INTRAVENOUS

## 2020-03-16 NOTE — Progress Notes (Signed)
Met with patient at registration to introduce myself as Arboriculturist and to offer available resources.  Discussed one-time $1000 Radio broadcast assistant to assist with personal expenses while going through treatment.  Based on verbal income guidelines, she states she is over the income limits.  Gave her my card for any other additional financial questions or concerns.

## 2020-03-17 ENCOUNTER — Encounter (HOSPITAL_COMMUNITY): Payer: Managed Care, Other (non HMO)

## 2020-03-17 ENCOUNTER — Encounter (HOSPITAL_BASED_OUTPATIENT_CLINIC_OR_DEPARTMENT_OTHER): Payer: Self-pay | Admitting: General Surgery

## 2020-03-17 ENCOUNTER — Ambulatory Visit (HOSPITAL_COMMUNITY): Payer: Managed Care, Other (non HMO)

## 2020-03-17 ENCOUNTER — Ambulatory Visit (HOSPITAL_BASED_OUTPATIENT_CLINIC_OR_DEPARTMENT_OTHER): Payer: Managed Care, Other (non HMO) | Admitting: Anesthesiology

## 2020-03-17 ENCOUNTER — Other Ambulatory Visit: Payer: Self-pay

## 2020-03-17 ENCOUNTER — Encounter: Payer: Self-pay | Admitting: *Deleted

## 2020-03-17 ENCOUNTER — Ambulatory Visit (HOSPITAL_BASED_OUTPATIENT_CLINIC_OR_DEPARTMENT_OTHER)
Admission: RE | Admit: 2020-03-17 | Discharge: 2020-03-17 | Disposition: A | Discharge status: Home / Self Care | Payer: Managed Care, Other (non HMO) | Attending: General Surgery | Admitting: General Surgery

## 2020-03-17 ENCOUNTER — Telehealth: Payer: Self-pay

## 2020-03-17 ENCOUNTER — Encounter (HOSPITAL_BASED_OUTPATIENT_CLINIC_OR_DEPARTMENT_OTHER)
Admission: RE | Disposition: A | Discharge status: Home / Self Care | Payer: Self-pay | Source: Home / Self Care | Attending: General Surgery

## 2020-03-17 ENCOUNTER — Other Ambulatory Visit (HOSPITAL_COMMUNITY): Payer: Managed Care, Other (non HMO)

## 2020-03-17 DIAGNOSIS — F17200 Nicotine dependence, unspecified, uncomplicated: Secondary | ICD-10-CM | POA: Diagnosis not present

## 2020-03-17 DIAGNOSIS — C50911 Malignant neoplasm of unspecified site of right female breast: Secondary | ICD-10-CM | POA: Diagnosis not present

## 2020-03-17 DIAGNOSIS — Z95828 Presence of other vascular implants and grafts: Secondary | ICD-10-CM

## 2020-03-17 DIAGNOSIS — Z803 Family history of malignant neoplasm of breast: Secondary | ICD-10-CM | POA: Diagnosis not present

## 2020-03-17 HISTORY — DX: Nausea with vomiting, unspecified: R11.2

## 2020-03-17 HISTORY — DX: Gastro-esophageal reflux disease without esophagitis: K21.9

## 2020-03-17 HISTORY — DX: Other specified postprocedural states: Z98.890

## 2020-03-17 HISTORY — PX: PORTACATH PLACEMENT: SHX2246

## 2020-03-17 SURGERY — INSERTION, TUNNELED CENTRAL VENOUS DEVICE, WITH PORT
Anesthesia: General | Site: Chest | Laterality: Left

## 2020-03-17 MED ORDER — LIDOCAINE 2% (20 MG/ML) 5 ML SYRINGE
INTRAMUSCULAR | Status: DC | PRN
  Administered 2020-03-17: 80 mg via INTRAVENOUS

## 2020-03-17 MED ORDER — ENSURE PRE-SURGERY PO LIQD: 296.0000 mL | Freq: Once | ORAL | Status: DC

## 2020-03-17 MED ORDER — ACETAMINOPHEN 500 MG PO TABS
ORAL_TABLET | ORAL | Status: AC
  Filled 2020-03-17: qty 2

## 2020-03-17 MED ORDER — DEXAMETHASONE SODIUM PHOSPHATE 4 MG/ML IJ SOLN
INTRAMUSCULAR | Status: DC | PRN
  Administered 2020-03-17: 10 mg via INTRAVENOUS

## 2020-03-17 MED ORDER — LIDOCAINE 2% (20 MG/ML) 5 ML SYRINGE
INTRAMUSCULAR | Status: AC
  Filled 2020-03-17: qty 5

## 2020-03-17 MED ORDER — FENTANYL CITRATE (PF) 100 MCG/2ML IJ SOLN
INTRAMUSCULAR | Status: DC | PRN
  Administered 2020-03-17: 25 ug via INTRAVENOUS
  Administered 2020-03-17: 50 ug via INTRAVENOUS
  Administered 2020-03-17: 25 ug via INTRAVENOUS

## 2020-03-17 MED ORDER — OXYCODONE HCL 5 MG/5ML PO SOLN: 5.0000 mg | Freq: Once | ORAL | Status: DC | PRN

## 2020-03-17 MED ORDER — PROPOFOL 10 MG/ML IV BOLUS
INTRAVENOUS | Status: DC | PRN
  Administered 2020-03-17: 150 mg via INTRAVENOUS
  Administered 2020-03-17: 50 mg via INTRAVENOUS
  Administered 2020-03-17: 50 ug/kg/min via INTRAVENOUS

## 2020-03-17 MED ORDER — HEPARIN (PORCINE) IN NACL 2-0.9 UNITS/ML
INTRAMUSCULAR | Status: AC | PRN
  Administered 2020-03-17: 1

## 2020-03-17 MED ORDER — HEPARIN SOD (PORK) LOCK FLUSH 100 UNIT/ML IV SOLN
INTRAVENOUS | Status: AC
  Filled 2020-03-17: qty 5

## 2020-03-17 MED ORDER — ONDANSETRON HCL 4 MG/2ML IJ SOLN
INTRAMUSCULAR | Status: DC | PRN
  Administered 2020-03-17: 4 mg via INTRAVENOUS

## 2020-03-17 MED ORDER — FENTANYL CITRATE (PF) 100 MCG/2ML IJ SOLN
INTRAMUSCULAR | Status: AC
  Filled 2020-03-17: qty 2

## 2020-03-17 MED ORDER — CEFAZOLIN SODIUM-DEXTROSE 2-4 GM/100ML-% IV SOLN
INTRAVENOUS | Status: AC
  Filled 2020-03-17: qty 100

## 2020-03-17 MED ORDER — HEPARIN SOD (PORK) LOCK FLUSH 100 UNIT/ML IV SOLN
INTRAVENOUS | Status: DC | PRN
  Administered 2020-03-17: 450 [IU]

## 2020-03-17 MED ORDER — ACETAMINOPHEN 500 MG PO TABS
1000.0000 mg | ORAL_TABLET | ORAL | Status: AC
  Administered 2020-03-17: 1000 mg via ORAL

## 2020-03-17 MED ORDER — MIDAZOLAM HCL 2 MG/2ML IJ SOLN
INTRAMUSCULAR | Status: AC
  Filled 2020-03-17: qty 2

## 2020-03-17 MED ORDER — PROPOFOL 10 MG/ML IV BOLUS
INTRAVENOUS | Status: AC
  Filled 2020-03-17: qty 40

## 2020-03-17 MED ORDER — BUPIVACAINE HCL (PF) 0.25 % IJ SOLN
INTRAMUSCULAR | Status: DC | PRN
  Administered 2020-03-17: 8 mL

## 2020-03-17 MED ORDER — OXYCODONE HCL 5 MG PO TABS: 5.0000 mg | ORAL_TABLET | Freq: Once | ORAL | Status: DC | PRN

## 2020-03-17 MED ORDER — CEFAZOLIN SODIUM-DEXTROSE 2-4 GM/100ML-% IV SOLN
2.0000 g | INTRAVENOUS | Status: AC
  Administered 2020-03-17: 2 g via INTRAVENOUS

## 2020-03-17 MED ORDER — LACTATED RINGERS IV SOLN: INTRAVENOUS | Status: DC

## 2020-03-17 MED ORDER — AMISULPRIDE (ANTIEMETIC) 5 MG/2ML IV SOLN: 10.0000 mg | Freq: Once | INTRAVENOUS | Status: DC | PRN

## 2020-03-17 MED ORDER — FENTANYL CITRATE (PF) 100 MCG/2ML IJ SOLN: 25.0000 ug | INTRAMUSCULAR | Status: DC | PRN

## 2020-03-17 MED ORDER — DEXAMETHASONE SODIUM PHOSPHATE 10 MG/ML IJ SOLN
INTRAMUSCULAR | Status: AC
  Filled 2020-03-17: qty 2

## 2020-03-17 MED ORDER — MIDAZOLAM HCL 5 MG/5ML IJ SOLN
INTRAMUSCULAR | Status: DC | PRN
  Administered 2020-03-17: 2 mg via INTRAVENOUS

## 2020-03-17 MED ORDER — HEPARIN (PORCINE) IN NACL 1000-0.9 UT/500ML-% IV SOLN
INTRAVENOUS | Status: AC
  Filled 2020-03-17: qty 500

## 2020-03-17 SURGICAL SUPPLY — 42 items
ADH SKN CLS APL DERMABOND .7 (GAUZE/BANDAGES/DRESSINGS) ×1
APL PRP STRL LF DISP 70% ISPRP (MISCELLANEOUS) ×1
APL SKNCLS STERI-STRIP NONHPOA (GAUZE/BANDAGES/DRESSINGS) ×1
BAG DECANTER FOR FLEXI CONT (MISCELLANEOUS) ×2 IMPLANT
BENZOIN TINCTURE PRP APPL 2/3 (GAUZE/BANDAGES/DRESSINGS) ×2 IMPLANT
BLADE SURG 11 STRL SS (BLADE) ×2 IMPLANT
BLADE SURG 15 STRL LF DISP TIS (BLADE) ×1 IMPLANT
BLADE SURG 15 STRL SS (BLADE) ×2
CHLORAPREP W/TINT 26 (MISCELLANEOUS) ×2 IMPLANT
COVER BACK TABLE 60X90IN (DRAPES) ×2 IMPLANT
COVER MAYO STAND STRL (DRAPES) ×2 IMPLANT
COVER PROBE 5X48 (MISCELLANEOUS) ×2
DERMABOND ADVANCED (GAUZE/BANDAGES/DRESSINGS) ×1
DERMABOND ADVANCED .7 DNX12 (GAUZE/BANDAGES/DRESSINGS) ×1 IMPLANT
DRAPE C-ARM 42X72 X-RAY (DRAPES) ×2 IMPLANT
DRAPE LAPAROSCOPIC ABDOMINAL (DRAPES) ×2 IMPLANT
DRAPE UTILITY XL STRL (DRAPES) ×2 IMPLANT
ELECT COATED BLADE 2.86 ST (ELECTRODE) ×2 IMPLANT
ELECT REM PT RETURN 9FT ADLT (ELECTROSURGICAL) ×2
ELECTRODE REM PT RTRN 9FT ADLT (ELECTROSURGICAL) ×1 IMPLANT
GAUZE SPONGE 4X4 12PLY STRL LF (GAUZE/BANDAGES/DRESSINGS) ×2 IMPLANT
GLOVE SURG ENC MOIS LTX SZ6.5 (GLOVE) ×2 IMPLANT
GLOVE SURG ENC MOIS LTX SZ7 (GLOVE) ×2 IMPLANT
GLOVE SURG UNDER POLY LF SZ7 (GLOVE) ×3 IMPLANT
GLOVE SURG UNDER POLY LF SZ7.5 (GLOVE) ×2 IMPLANT
GOWN STRL REUS W/ TWL LRG LVL3 (GOWN DISPOSABLE) ×2 IMPLANT
GOWN STRL REUS W/TWL LRG LVL3 (GOWN DISPOSABLE) ×4
KIT CVR 48X5XPRB PLUP LF (MISCELLANEOUS) IMPLANT
KIT PORT POWER 8FR ISP CVUE (Port) ×1 IMPLANT
NDL HYPO 25X1 1.5 SAFETY (NEEDLE) ×1 IMPLANT
NEEDLE HYPO 25X1 1.5 SAFETY (NEEDLE) ×2 IMPLANT
PACK BASIN DAY SURGERY FS (CUSTOM PROCEDURE TRAY) ×2 IMPLANT
PENCIL SMOKE EVACUATOR (MISCELLANEOUS) ×2 IMPLANT
SLEEVE SCD COMPRESS KNEE MED (STOCKING) ×2 IMPLANT
STRIP CLOSURE SKIN 1/2X4 (GAUZE/BANDAGES/DRESSINGS) ×2 IMPLANT
SUT MNCRL AB 4-0 PS2 18 (SUTURE) ×2 IMPLANT
SUT PROLENE 2 0 SH DA (SUTURE) ×2 IMPLANT
SUT VIC AB 3-0 SH 27 (SUTURE) ×2
SUT VIC AB 3-0 SH 27X BRD (SUTURE) ×1 IMPLANT
SYR 5ML LUER SLIP (SYRINGE) ×2 IMPLANT
SYR CONTROL 10ML LL (SYRINGE) ×2 IMPLANT
TOWEL GREEN STERILE FF (TOWEL DISPOSABLE) ×2 IMPLANT

## 2020-03-17 NOTE — Transfer of Care (Signed)
Immediate Anesthesia Transfer of Care Note  Patient: Sabrina Woods  Procedure(s) Performed: INSERTION PORT-A-CATH (Left Chest)  Patient Location: PACU  Anesthesia Type:General  Level of Consciousness: drowsy  Airway & Oxygen Therapy: Patient Spontanous Breathing and Patient connected to face mask oxygen  Post-op Assessment: Report given to RN and Post -op Vital signs reviewed and stable  Post vital signs: Reviewed and stable  Last Vitals:  Vitals Value Taken Time  BP 120/74 03/17/20 1323  Temp    Pulse 74 03/17/20 1325  Resp 0 03/17/20 1325  SpO2 100 % 03/17/20 1325  Vitals shown include unvalidated device data.  Last Pain:  Vitals:   03/17/20 1126  TempSrc: Oral  PainSc: 0-No pain         Complications: No complications documented.

## 2020-03-17 NOTE — Anesthesia Preprocedure Evaluation (Addendum)
Anesthesia Evaluation  Patient identified by MRN, date of birth, ID band Patient awake and Patient confused    Reviewed: Allergy & Precautions, NPO status , Patient's Chart, lab work & pertinent test results  History of Anesthesia Complications (+) PONV and history of anesthetic complications  Airway Mallampati: II  TM Distance: >3 FB Neck ROM: Full    Dental no notable dental hx.    Pulmonary Current Smoker and Patient abstained from smoking.,    Pulmonary exam normal breath sounds clear to auscultation       Cardiovascular Exercise Tolerance: Good negative cardio ROS Normal cardiovascular exam Rhythm:Regular Rate:Normal  ECHO 03/16/20:  1. Left ventricular ejection fraction, by estimation, is 65 to 70%. The  left ventricle has normal function. The left ventricle has no regional  wall motion abnormalities. There is mild left ventricular hypertrophy of  the basal-septal segment. Left  ventricular diastolic parameters are consistent with Grade I diastolic  dysfunction (impaired relaxation). The average left ventricular global  longitudinal strain is -20.2 %. The global longitudinal strain is normal.  2. Right ventricular systolic function is normal. The right ventricular  size is normal.  3. The mitral valve is normal in structure. No evidence of mitral valve  regurgitation. No evidence of mitral stenosis.  4. The aortic valve is normal in structure. Aortic valve regurgitation is  not visualized. No aortic stenosis is present.  5. The inferior vena cava is normal in size with greater than 50%  respiratory variability, suggesting right atrial pressure of 3 mmHg.   Neuro/Psych PSYCHIATRIC DISORDERS Depression    GI/Hepatic Neg liver ROS, GERD  Medicated and Controlled,  Endo/Other  negative endocrine ROS  Renal/GU negative Renal ROS  negative genitourinary   Musculoskeletal negative musculoskeletal ROS (+)    Abdominal   Peds  Hematology negative hematology ROS (+)   Anesthesia Other Findings   Reproductive/Obstetrics negative OB ROS                            Anesthesia Physical Anesthesia Plan  ASA: III  Anesthesia Plan: General   Post-op Pain Management:    Induction: Intravenous  PONV Risk Score and Plan: 3 and Ondansetron, Dexamethasone, Midazolam and Treatment may vary due to age or medical condition  Airway Management Planned: LMA  Additional Equipment: None  Intra-op Plan:   Post-operative Plan: Extubation in OR  Informed Consent: I have reviewed the patients History and Physical, chart, labs and discussed the procedure including the risks, benefits and alternatives for the proposed anesthesia with the patient or authorized representative who has indicated his/her understanding and acceptance.     Dental advisory given  Plan Discussed with: CRNA, Anesthesiologist and Surgeon  Anesthesia Plan Comments:        Anesthesia Quick Evaluation

## 2020-03-17 NOTE — H&P (Signed)
65 yof smoker who has no prior breast history. she has family history of breast cancer in her mom in her 65s. she has noted a right breast mass for one year. she has gotten a mm in February that shows b density breasts. she was noted to have right breast skin thickening and a dense mass throughout the uoq and loq of the right breast. there is also a low axillary mass. US shows at least a 11x5.9x6.3 cm mass that extends to the skin at 10 oclock. in the axilla there are two adjacent masses measuring 2.6x2.4cm and 2.5x2.6 cm. In the left breast at 330 there is a small group of cysts, there is another group of cysts near that, some ax nodes with fct and a retroareolar mass measuring 3 cm greatest dimension. 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 the right ax node is IDC. the mass is er pos (5%), pr neg, her 2 pos, Ki 25% IDC that is grade III. she is here to discuss options with her husband today   Past Surgical History Illene Regulus, CMA; 03/09/2020 3:56 PM) Cesarean Section - Multiple   Diagnostic Studies History Illene Regulus, CMA; 03/09/2020 3:56 PM) Colonoscopy  never Mammogram  within last year Pap Smear  1-5 years ago  Allergies Illene Regulus, CMA; 03/09/2020 3:57 PM) No Known Drug Allergies  [03/09/2020]:  Medication History Illene Regulus, CMA; 03/09/2020 3:57 PM) ALPRAZolam (0.25MG  Tablet, Oral) Active. Multi-Day (Oral) Active. Advil (200MG  Tablet, Oral) Active. Tylenol (325MG  Tablet, Oral) Active. Medications Reconciled  Social History Illene Regulus, CMA; 03/09/2020 3:56 PM) Alcohol use  Occasional alcohol use. Caffeine use  Carbonated beverages, Coffee. No drug use  Tobacco use  Current every day smoker.  Family History Illene Regulus, [place]; 03/09/2020 3:56 PM) Breast Cancer  Mother. Diabetes Mellitus  Mother. Hypertension  Father, Mother, Sister.  Pregnancy / Birth History Illene Regulus, CMA; 03/09/2020  3:56 PM) Age at menarche  61 years. Age of menopause  51-55 Contraceptive History  Intrauterine device, Oral contraceptives. Gravida  2 Length (months) of breastfeeding  3-6 Maternal age  54-30 Para  2  Other Problems Illene Regulus, CMA; 03/09/2020 3:56 PM) No pertinent past medical history    Review of Systems Lars Mage Spillers CMA; 03/09/2020 3:56 PM) General Not Present- Appetite Loss, Chills, Fatigue, Fever, Night Sweats, Weight Gain and Weight Loss. Skin Not Present- Change in Wart/Mole, Dryness, Hives, Jaundice, New Lesions, Non-Healing Wounds, Rash and Ulcer. HEENT Present- Wears glasses/contact lenses. Not Present- Earache, Hearing Loss, Hoarseness, Nose Bleed, Oral Ulcers, Ringing in the Ears, Seasonal Allergies, Sinus Pain, Sore Throat, Visual Disturbances and Yellow Eyes. Respiratory Not Present- Bloody sputum, Chronic Cough, Difficulty Breathing, Snoring and Wheezing. Breast Present- Breast Mass. Not Present- Breast Pain, Nipple Discharge and Skin Changes. Cardiovascular Not Present- Chest Pain, Difficulty Breathing Lying Down, Leg Cramps, Palpitations, Rapid Heart Rate, Shortness of Breath and Swelling of Extremities. Gastrointestinal Not Present- Abdominal Pain, Bloating, Bloody Stool, Change in Bowel Habits, Chronic diarrhea, Constipation, Difficulty Swallowing, Excessive gas, Gets full quickly at meals, Hemorrhoids, Indigestion, Nausea, Rectal Pain and Vomiting. Female Genitourinary Not Present- Frequency, Nocturia, Painful Urination, Pelvic Pain and Urgency. Musculoskeletal Not Present- Back Pain, Joint Pain, Joint Stiffness, Muscle Pain, Muscle Weakness and Swelling of Extremities. Neurological Not Present- Decreased Memory, Fainting, Headaches, Numbness, Seizures, Tingling, Tremor, Trouble walking and Weakness. Psychiatric Not Present- Anxiety, Bipolar, Change in Sleep Pattern, Depression, Fearful and Frequent crying. Endocrine Not Present- Cold Intolerance,  Excessive  Hunger, Hair Changes, Heat Intolerance, Hot flashes and New Diabetes. Hematology Not Present- Blood Thinners, Easy Bruising, Excessive bleeding, Gland problems, HIV and Persistent Infections.  Vitals (Alisha Spillers CMA; 03/09/2020 3:57 PM) 03/09/2020 3:56 PM Weight: 209.6 lb Height: 67in Body Surface Area: 2.06 m Body Mass Index: 32.83 kg/m  Pulse: 111 (Regular)  BP: 168/82(Sitting, Left Arm, Standard) Physical Exam Rolm Bookbinder MD; 03/09/2020 8:49 PM) General Mental Status-Alert. Orientation-Oriented X3. Breast Nipples-No Discharge. Note: no left breast mass large lateral to nac right breast mass nearly coming through skin this is at least 10 cm in size Lymphatic Head & Neck General Head & Neck Lymphatics: Bilateral - Description - Normal. Axillary -Note: no left axillary lad palpable right axillary mass really contiguous with the right breast mass. Note: no [place] adenopathy   Assessment & Plan Rolm Bookbinder MD; 03/09/2020 8:52 PM) BREAST CANCER METASTASIZED TO AXILLARY LYMPH NODE, RIGHT (C50.911) Story: Port placement, MRI, staging scans, sozo measurement, primary systemic therapy We discussed staging and pathophysiology of breast cancer and all available treatments. We discussed indication for primary systemic therapy. I do not think this is clinically inflammatory cancer. I do however think that she likely just needs an mrm even if has a great response at the time of surgery. I discussed port placement with her today

## 2020-03-17 NOTE — Op Note (Signed)
Preoperative diagnosisstage III right breast cancer Postoperative diagnosis: Same as above Procedure: Leftinternal jugular port placement with ultrasound guidance Surgeon: Dr. Serita Grammes Anesthesia: General  Estimated blood loss:minimal Specimens:none Sponge and needlecount was correct atcompletion Drains: None Disposition recovery stable condition  Indications:65 yof smoker who has no prior breast history. she has noted a right breast mass for one year. she has gotten a mm in February that shows b density breasts. she was noted to have right breast skin thickening and a dense mass throughout the uoq and loq of the right breast. there is also a low axillary mass. US shows at least a 11x5.9x6.3 cm mass that extends to the skin at 10 oclock. in the axilla there are two adjacent masses measuring 2.6x2.4cm and 2.5x2.6 cm. In the left breast at 330 there is a small group of cysts, there is another group of cysts near that, some ax nodes with fct and a retroareolar mass measuring 3 cm greatest dimension. 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 the right ax node is IDC. the mass is er pos (5%), pr neg, her 2 pos, Ki 25% IDC that is grade III. we discussed port placement for primary systemic therapy.   Procedure: After informed consent was obtainedshe was taken to the OR.She was given antibiotics. SCDs were placed. She was placed under general anesthesia without complication. She was prepped and draped in the standard sterile surgical fashion. A surgical timeout was then performed.  I used the ultrasound to identify theleftinternal jugular vein. Under ultrasound guidance I then accessed the vein with the needle. I passed the wire. The wire was in the vein both by ultrasound and by fluoroscopy. I thenmade an incisionon herleft chest.I tunneled the line between the 2 sites. I then placed the dilator over the wire. I observed this  with fluoroscopy to go in the correct position. I then removedthe wire. I then passed the line. The peel-away sheath was removed. I pulled the line back to be in the superior vena cava. The tip of the line is in the superior vena cava. I then attached the port. I sutured this into place with 2-0 Prolene. I then closed this with 3-0 Vicryl and 4-0 Monocryl. Glue was placed. Final fluoroscopic image showed the port to be in good position. I then accessed the port and was able to aspirate blood and packed this with heparin.

## 2020-03-17 NOTE — Anesthesia Procedure Notes (Signed)
Procedure Name: LMA Insertion Performed by: Merlinda Frederick, MD Pre-anesthesia Checklist: Patient identified, Emergency Drugs available, Suction available and Patient being monitored Patient Re-evaluated:Patient Re-evaluated prior to induction Oxygen Delivery Method: Circle System Utilized Preoxygenation: Pre-oxygenation with 100% oxygen Induction Type: IV induction Ventilation: Mask ventilation without difficulty LMA: LMA inserted LMA Size: 3.0 Number of attempts: 2 Airway Equipment and Method: Bite block Placement Confirmation: positive ETCO2 Tube secured with: Tape Dental Injury: Teeth and Oropharynx as per pre-operative assessment

## 2020-03-17 NOTE — Discharge Instructions (Signed)
PORT-A-CATH: POST OP INSTRUCTIONS  Always review your discharge instruction sheet given to you by the facility where your surgery was performed.   1. A prescription for pain medication may be given to you upon discharge. Take your pain medication as prescribed, if needed. If narcotic pain medicine is not needed, then you make take acetaminophen (Tylenol) or ibuprofen (Advil) as needed.  2. Take your usually prescribed medications unless otherwise directed. 3. If you need a refill on your pain medication, please contact our office. All narcotic pain medicine now requires a paper prescription.  Phoned in and fax refills are no longer allowed by law.  Prescriptions will not be filled after 5 pm or on weekends.  4. You should follow a light diet for the remainder of the day after your procedure. 5. Most patients will experience some mild swelling and/or bruising in the area of the incision. It may take several days to resolve. 6. It is common to experience some constipation if taking pain medication after surgery. Increasing fluid intake and taking a stool softener (such as Colace) will usually help or prevent this problem from occurring. A mild laxative (Milk of Magnesia or Miralax) should be taken according to package directions if there are no bowel movements after 48 hours.  7. Unless discharge instructions indicate otherwise, you may remove your bandages 48 hours after surgery, and you may shower at that time. You may have steri-strips (small white skin tapes) in place directly over the incision.  These strips should be left on the skin for 7-10 days.  If your surgeon used Dermabond (skin glue) on the incision, you may shower in 24 hours.  The glue will flake off over the next 2-3 weeks.  8. If your port is left accessed at the end of surgery (needle left in port), the dressing cannot get wet and should only by changed by a healthcare professional. When the port is no longer accessed (when the  needle has been removed), follow step 7.   9. ACTIVITIES:  Limit activity involving your arms for the next 72 hours. Do no strenuous exercise or activity for 1 week. You may drive when you are no longer taking prescription pain medication, you can comfortably wear a seatbelt, and you can maneuver your car. 10.You may need to see your doctor in the office for a follow-up appointment.  Please       check with your doctor.  11.When you receive a new Port-a-Cath, you will get a product guide and        ID card.  Please keep them in case you need them.  WHEN TO CALL YOUR DOCTOR [phone]): 1. Fever over 101.0 2. Chills 3. Continued bleeding from incision 4. Increased redness and tenderness at the site 5. Shortness of breath, difficulty breathing   The clinic staff is available to answer your questions during regular business hours. Please don't hesitate to call and ask to speak to one of the nurses or medical assistants for clinical concerns. If you have a medical emergency, go to the nearest emergency room or call 911.  A surgeon from [facility] is always on call at the hospital.     For further information, please visit [email or web address]   Post Anesthesia Home Care Instructions  Activity: Get plenty of rest for the remainder of the day. A responsible individual must stay with you for 24 hours following the procedure.  For the next 24 hours, DO NOT: -Drive a car -  Operate machinery -Drink alcoholic beverages -Take any medication unless instructed by your physician -Make any legal decisions or sign important papers.  Meals: Start with liquid foods such as gelatin or soup. Progress to regular foods as tolerated. Avoid greasy, spicy, heavy foods. If nausea and/or vomiting occur, drink only clear liquids until the nausea and/or vomiting subsides. Call your physician if vomiting continues.  Special Instructions/Symptoms: Your throat may feel dry or sore from  the anesthesia or the breathing tube placed in your throat during surgery. If this causes discomfort, gargle with warm salt water. The discomfort should disappear within 24 hours.  If you had a scopolamine patch placed behind your ear for the management of post- operative nausea and/or vomiting:  1. The medication in the patch is effective for 72 hours, after which it should be removed.  Wrap patch in a tissue and discard in the trash. Wash hands thoroughly with soap and water. 2. You may remove the patch earlier than 72 hours if you experience unpleasant side effects which may include dry mouth, dizziness or visual disturbances. 3. Avoid touching the patch. Wash your hands with soap and water after contact with the patch.    No Tylenol until 6:00PM.

## 2020-03-17 NOTE — Telephone Encounter (Signed)
Ronalee Belts, pharmacist with [facility], called to confirm that approval was granted for first injection of Ziextenzo at [facility].   Per Ronalee Belts, request will be forward to nursing department at [facility] for patient assignment, [facility] will be contacting patient.    Ronalee Belts contact number with [facility] [phone]. Nursing contact number with [facility] [phone].

## 2020-03-18 ENCOUNTER — Telehealth: Payer: Self-pay | Admitting: *Deleted

## 2020-03-18 ENCOUNTER — Encounter (HOSPITAL_BASED_OUTPATIENT_CLINIC_OR_DEPARTMENT_OTHER): Payer: Self-pay | Admitting: General Surgery

## 2020-03-18 NOTE — Progress Notes (Signed)
Patient Care Team: [name], MD as PCP - General (Hematology and Oncology) Mauro Kaufmann, RN as Oncology Nurse Navigator Rockwell Germany, RN as Oncology Nurse Navigator  DIAGNOSIS:    ICD-10-CM   1. Malignant neoplasm of upper-outer quadrant of right breast in female, estrogen receptor positive ([place])  C50.411    Z17.0     SUMMARY OF ONCOLOGIC HISTORY: Oncology History  Malignant neoplasm of upper-outer quadrant of right breast in female, estrogen receptor positive ([place])  03/10/2020 Initial Diagnosis   Palpable right breast mass x6 months.  Mammogram revealed left breast cysts (intraductal papilloma), 11 cm right breast mass and 2 masses 2.6 cm each in the right axilla: Biopsy grade 3 IDC ER 5% weak, PR 0%, Ki-67 25%, HER-2 3+ IHC positive   03/10/2020 Cancer Staging   Staging form: Breast, AJCC 8th Edition - Clinical stage from 03/10/2020: Stage IIIA (cT3, cN1, cM0, G3, ER+, PR-, HER2+) - Signed by [name], MD on 03/10/2020 Stage prefix: Initial diagnosis   03/19/2020 -  Chemotherapy    Patient is on Treatment Plan: BREAST  DOCETAXEL + CARBOPLATIN + TRASTUZUMAB + PERTUZUMAB  (TCHP) Q21D         CHIEF COMPLIANT: Cycle 1 TCHP  INTERVAL HISTORY: Sabrina Woods is a 65 y.o. with above-mentioned history of right breast cancer currently on neoadjuvant chemotherapy with [place] Perjeta. Echo on 03/16/20 showed an ejection fraction of 65-70%. Bone scan on 03/13/20 showed uptake in the ribs possibly representing carcinoma. CT CAP on 03/16/20 showed the known right breast mass and axillary adenopathy and two likely benign hepatic masses. Her port was placed by Dr. Donne Hazel on 03/17/20. She presents to the clinic today for treatment.   ALLERGIES:  has No Known Allergies.  MEDICATIONS:  Current Outpatient Medications  Medication Sig Dispense Refill  . ALPRAZolam (XANAX) 0.25 MG tablet Take 1 tablet (0.25 mg total) by mouth 2 (two) times daily as needed for anxiety. 60 tablet 3  .  dexamethasone (DECADRON) 4 MG tablet Take 1 tablet (4 mg total) by mouth daily. Take 1 tablet day before chemo and 1 tablet day after chemo with food 30 tablet 1  . esomeprazole (NEXIUM) 20 MG capsule Take by mouth daily.    Marland Kitchen lidocaine-prilocaine (EMLA) cream Apply to affected area once 30 g 3  . Multiple Vitamin (MULTIVITAMIN) capsule Take 1 capsule by mouth daily.    . nicotine (NICODERM CQ) 14 mg/24hr patch Place 1 patch (14 mg total) onto the skin daily. 14 patch 0  . nicotine (NICODERM CQ) 7 mg/24hr patch Place 1 patch (7 mg total) onto the skin daily. 28 patch 0  . ondansetron (ZOFRAN) 8 MG tablet Take 1 tablet (8 mg total) by mouth 2 (two) times daily as needed (Nausea or vomiting). Start on the third day after chemotherapy. 30 tablet 1  . prochlorperazine (COMPAZINE) 10 MG tablet Take 1 tablet (10 mg total) by mouth every 6 (six) hours as needed (Nausea or vomiting). 30 tablet 1  . venlafaxine XR (EFFEXOR-XR) 37.5 MG 24 hr capsule Take 1 capsule (37.5 mg total) by mouth daily with breakfast. 30 capsule 6   Current Facility-Administered Medications  Medication Dose Route Frequency Provider Last Rate Last Admin  . levonorgestrel (MIRENA) 20 MCG/24HR IUD   Intrauterine Once Bennetta Laos, MD        PHYSICAL EXAMINATION: ECOG PERFORMANCE STATUS: 1 - Symptomatic but completely ambulatory  Vitals:   03/19/20 0944  BP: (!) 174/86  Pulse: 88  Resp: 20  Temp: 97.9 F (36.6 C)  SpO2: 99%   Filed Weights   03/19/20 0944  Weight: 206 lb 9.6 oz (93.7 kg)    LABORATORY DATA:  I have reviewed the data as listed CMP Latest Ref Rng & Units 03/12/2020  Glucose 70 - 99 mg/dL 319(H)  BUN 8 - 23 mg/dL 7(L)  Creatinine 0.44 - 1.00 mg/dL 0.81  Sodium 135 - 145 mmol/L 134(L)  Potassium 3.5 - 5.1 mmol/L 4.0  Chloride 98 - 111 mmol/L 105  CO2 22 - 32 mmol/L 22  Calcium 8.9 - 10.3 mg/dL 9.2  Total Protein 6.5 - 8.1 g/dL 7.0  Total Bilirubin 0.3 - 1.2 mg/dL 0.3  Alkaline Phos 38 - 126  U/L 76  AST 15 - 41 U/L 14(L)  ALT 0 - 44 U/L 18    Lab Results  Component Value Date   WBC 14.2 (H) 03/19/2020   HGB 13.2 03/19/2020   HCT 39.6 03/19/2020   MCV 90.6 03/19/2020   PLT 420 (H) 03/19/2020   NEUTROABS 11.1 (H) 03/19/2020    ASSESSMENT & PLAN:  Malignant neoplasm of upper-outer quadrant of right breast in female, estrogen receptor positive ([place]) 03/04/2020:Palpable right breast mass x6 months.  Mammogram revealed left breast cysts (intraductal papilloma), 11 cm right breast mass and 2 masses 2.6 cm each in the right axilla: Biopsy grade 3 IDC ER 5% weak, PR 0%, Ki-67 25%, HER-2 3+ IHC positive  Treatment Plan: 1. Neoadjuvant chemotherapy with TCH Perjeta 6 cycles followed by Herceptin Perjeta maintenance versus Kadcyla maintenance (based on response to neoadjuvant chemo) for 1 year 2. Followed by mastectomy with sentinel lymph node study 3. Followed by adjuvant radiation therapy 4.  Followed by antiestrogen therapy 5.  Consideration for neratinib ----------------------------------------------------------------------------------------------------------------- 03/15/20: Bone Scan: Subtle uptake in ribs: Non diagnostic 03/16/20: CT CAP: Large Rt breast mass with skin thickening, small sat lesion 1.8 cm, multiple enlarged LN, 4 mm LLL nodule ---------------------------------------------------------------------------------------------------------------- Current Treatment: Cycle 1 TCHP ECHO 03/16/20: EF 65-70% Labs reviewed. RTC in 1 week for tox check   No orders of the defined types were placed in this encounter.  The patient has a good understanding of the overall plan. she agrees with it. she will call with any problems that may develop before the next visit here.  Total time spent: 30 mins including face to face time and time spent for planning, charting and coordination of care  Rulon Eisenmenger, MD, MPH 03/19/2020  I, Molly Dorshimer, am acting as scribe for Dr.  [name].  I have reviewed the above documentation for accuracy and completeness, and I agree with the above.

## 2020-03-18 NOTE — Telephone Encounter (Signed)
Pt called earlier today & left vm with questions regarding her EMLA cream since port not left accessed.  Informed pt not to use EMLA tomorrow & ask nurse to use ice prior to accessing port. She expressed understanding.

## 2020-03-18 NOTE — Anesthesia Postprocedure Evaluation (Signed)
Anesthesia Post Note  Patient: Sabrina Woods  Procedure(s) Performed: INSERTION PORT-A-CATH (Left Chest)     Patient location during evaluation: PACU Anesthesia Type: General Level of consciousness: awake Pain management: pain level controlled Vital Signs Assessment: post-procedure vital signs reviewed and stable Respiratory status: spontaneous breathing and respiratory function stable Cardiovascular status: stable Postop Assessment: no apparent nausea or vomiting Anesthetic complications: no   No complications documented.  Last Vitals:  Vitals:   03/17/20 1408 03/17/20 1427  BP: (!) 156/83 (!) 156/83  Pulse: 72 72  Resp: 16 17  Temp: 36.6 C 36.6 C  SpO2: 96% 96%    Last Pain:  Vitals:   03/17/20 1126  TempSrc: Oral                 Merlinda Frederick

## 2020-03-18 NOTE — Assessment & Plan Note (Signed)
03/04/2020:Palpable right breast mass x6 months.  Mammogram revealed left breast cysts (intraductal papilloma), 11 cm right breast mass and 2 masses 2.6 cm each in the right axilla: Biopsy grade 3 IDC ER 5% weak, PR 0%, Ki-67 25%, HER-2 3+ IHC positive  Treatment Plan: 1. Neoadjuvant chemotherapy with TCH Perjeta 6 cycles followed by Herceptin Perjeta maintenance versus Kadcyla maintenance (based on response to neoadjuvant chemo) for 1 year 2. Followed by mastectomy with sentinel lymph node study 3. Followed by adjuvant radiation therapy 4.  Followed by antiestrogen therapy 5.  Consideration for neratinib ----------------------------------------------------------------------------------------------------------------- 03/15/20: Bone Scan: Subtle uptake in ribs: Non diagnostic 03/16/20: CT CAP: Large Rt breast mass with skin thickening, small sat lesion 1.8 cm, multiple enlarged LN, 4 mm LLL nodule ---------------------------------------------------------------------------------------------------------------- Current Treatment: Cycle 1 TCHP ECHO 03/16/20: EF 65-70% Labs reviewed. RTC in 1 week for tox check

## 2020-03-19 ENCOUNTER — Inpatient Hospital Stay: Payer: Managed Care, Other (non HMO)

## 2020-03-19 ENCOUNTER — Other Ambulatory Visit: Payer: Self-pay

## 2020-03-19 ENCOUNTER — Encounter: Payer: Self-pay | Admitting: *Deleted

## 2020-03-19 ENCOUNTER — Other Ambulatory Visit (HOSPITAL_COMMUNITY): Payer: Managed Care, Other (non HMO)

## 2020-03-19 ENCOUNTER — Inpatient Hospital Stay (HOSPITAL_BASED_OUTPATIENT_CLINIC_OR_DEPARTMENT_OTHER): Payer: Managed Care, Other (non HMO) | Admitting: Hematology and Oncology

## 2020-03-19 VITALS — BP 162/75 | HR 86 | Temp 97.7°F | Resp 18

## 2020-03-19 DIAGNOSIS — C50411 Malignant neoplasm of upper-outer quadrant of right female breast: Secondary | ICD-10-CM | POA: Diagnosis not present

## 2020-03-19 DIAGNOSIS — Z170 Estrogen receptor positive status [ER+]: Secondary | ICD-10-CM

## 2020-03-19 DIAGNOSIS — Z5112 Encounter for antineoplastic immunotherapy: Secondary | ICD-10-CM | POA: Diagnosis not present

## 2020-03-19 DIAGNOSIS — R12 Heartburn: Secondary | ICD-10-CM

## 2020-03-19 DIAGNOSIS — Z95828 Presence of other vascular implants and grafts: Secondary | ICD-10-CM | POA: Insufficient documentation

## 2020-03-19 LAB — CMP (CANCER CENTER ONLY)
ALT: 17 U/L (ref 0–44)
AST: 12 U/L — ABNORMAL LOW (ref 15–41)
Albumin: 4 g/dL (ref 3.5–5.0)
Alkaline Phosphatase: 71 U/L (ref 38–126)
Anion gap: 9 (ref 5–15)
BUN: 13 mg/dL (ref 8–23)
CO2: 25 mmol/L (ref 22–32)
Calcium: 9.7 mg/dL (ref 8.9–10.3)
Chloride: 103 mmol/L (ref 98–111)
Creatinine: 0.81 mg/dL (ref 0.44–1.00)
GFR, Estimated: >60 mL/min (ref >60)
Glucose, Bld: 293 mg/dL — ABNORMAL HIGH (ref 70–99)
Potassium: 3.8 mmol/L (ref 3.5–5.1)
Sodium: 137 mmol/L (ref 135–145)
Total Bilirubin: 0.2 mg/dL — ABNORMAL LOW (ref 0.3–1.2)
Total Protein: 7.3 g/dL (ref 6.5–8.1)

## 2020-03-19 LAB — CBC WITH DIFFERENTIAL (CANCER CENTER ONLY)
Abs Immature Granulocytes: 0.09 10*3/uL — ABNORMAL HIGH (ref 0.00–0.07)
Basophils Absolute: 0.1 10*3/uL (ref 0.0–0.1)
Basophils Relative: 0 %
Eosinophils Absolute: 0 10*3/uL (ref 0.0–0.5)
Eosinophils Relative: 0 %
HCT: 39.6 % (ref 36.0–46.0)
Hemoglobin: 13.2 g/dL (ref 12.0–15.0)
Immature Granulocytes: 1 %
Lymphocytes Relative: 15 %
Lymphs Abs: 2.1 10*3/uL (ref 0.7–4.0)
MCH: 30.2 pg (ref 26.0–34.0)
MCHC: 33.3 g/dL (ref 30.0–36.0)
MCV: 90.6 fL (ref 80.0–100.0)
Monocytes Absolute: 0.9 10*3/uL (ref 0.1–1.0)
Monocytes Relative: 6 %
Neutro Abs: 11.1 10*3/uL — ABNORMAL HIGH (ref 1.7–7.7)
Neutrophils Relative %: 78 %
Platelet Count: 420 10*3/uL — ABNORMAL HIGH (ref 150–400)
RBC: 4.37 MIL/uL (ref 3.87–5.11)
RDW: 12.2 % (ref 11.5–15.5)
WBC Count: 14.2 10*3/uL — ABNORMAL HIGH (ref 4.0–10.5)
nRBC: 0 % (ref 0.0–0.2)

## 2020-03-19 MED ORDER — ACETAMINOPHEN 325 MG PO TABS
650.0000 mg | ORAL_TABLET | Freq: Once | ORAL | Status: AC
  Administered 2020-03-19: 650 mg via ORAL

## 2020-03-19 MED ORDER — DIPHENHYDRAMINE HCL 25 MG PO CAPS
50.0000 mg | ORAL_CAPSULE | Freq: Once | ORAL | Status: AC
  Administered 2020-03-19: 50 mg via ORAL

## 2020-03-19 MED ORDER — SODIUM CHLORIDE 0.9 % IV SOLN
700.0000 mg | Freq: Once | INTRAVENOUS | Status: AC
  Administered 2020-03-19: 700 mg via INTRAVENOUS
  Filled 2020-03-19: qty 70

## 2020-03-19 MED ORDER — FAMOTIDINE 20 MG PO TABS
ORAL_TABLET | ORAL | Status: AC
  Filled 2020-03-19: qty 1

## 2020-03-19 MED ORDER — SODIUM CHLORIDE 0.9 % IV SOLN
Freq: Once | INTRAVENOUS | Status: AC
  Filled 2020-03-19: qty 250

## 2020-03-19 MED ORDER — DEXAMETHASONE SODIUM PHOSPHATE 100 MG/10ML IJ SOLN
10.0000 mg | Freq: Once | INTRAMUSCULAR | Status: AC
  Administered 2020-03-19: 10 mg via INTRAVENOUS
  Filled 2020-03-19: qty 10

## 2020-03-19 MED ORDER — DIPHENHYDRAMINE HCL 25 MG PO CAPS
ORAL_CAPSULE | ORAL | Status: AC
  Filled 2020-03-19: qty 2

## 2020-03-19 MED ORDER — ACETAMINOPHEN 325 MG PO TABS
ORAL_TABLET | ORAL | Status: AC
  Filled 2020-03-19: qty 2

## 2020-03-19 MED ORDER — TRASTUZUMAB-ANNS CHEMO 150 MG IV SOLR
750.0000 mg | Freq: Once | INTRAVENOUS | Status: AC
  Administered 2020-03-19: 750 mg via INTRAVENOUS
  Filled 2020-03-19: qty 35.72

## 2020-03-19 MED ORDER — FAMOTIDINE 20 MG PO TABS
20.0000 mg | ORAL_TABLET | Freq: Once | ORAL | Status: AC
  Administered 2020-03-19: 20 mg via ORAL

## 2020-03-19 MED ORDER — PALONOSETRON HCL INJECTION 0.25 MG/5ML
0.2500 mg | Freq: Once | INTRAVENOUS | Status: AC
  Administered 2020-03-19: 0.25 mg via INTRAVENOUS

## 2020-03-19 MED ORDER — HEPARIN SOD (PORK) LOCK FLUSH 100 UNIT/ML IV SOLN
500.0000 [IU] | Freq: Once | INTRAVENOUS | Status: AC | PRN
  Administered 2020-03-19: 500 [IU]
  Filled 2020-03-19: qty 5

## 2020-03-19 MED ORDER — FOSAPREPITANT DIMEGLUMINE INJECTION 150 MG
150.0000 mg | Freq: Once | INTRAVENOUS | Status: AC
  Administered 2020-03-19: 150 mg via INTRAVENOUS
  Filled 2020-03-19: qty 150

## 2020-03-19 MED ORDER — SODIUM CHLORIDE 0.9% FLUSH
10.0000 mL | INTRAVENOUS | Status: DC | PRN
  Administered 2020-03-19: 10 mL
  Filled 2020-03-19: qty 10

## 2020-03-19 MED ORDER — SODIUM CHLORIDE 0.9 % IV SOLN
75.0000 mg/m2 | Freq: Once | INTRAVENOUS | Status: AC
  Administered 2020-03-19: 160 mg via INTRAVENOUS
  Filled 2020-03-19: qty 16

## 2020-03-19 MED ORDER — SODIUM CHLORIDE 0.9 % IV SOLN
840.0000 mg | Freq: Once | INTRAVENOUS | Status: AC
  Administered 2020-03-19: 840 mg via INTRAVENOUS
  Filled 2020-03-19: qty 28

## 2020-03-19 MED ORDER — PALONOSETRON HCL INJECTION 0.25 MG/5ML
INTRAVENOUS | Status: AC
  Filled 2020-03-19: qty 5

## 2020-03-19 MED ORDER — SODIUM CHLORIDE 0.9% FLUSH
10.0000 mL | Freq: Once | INTRAVENOUS | Status: AC
  Administered 2020-03-19: 10 mL
  Filled 2020-03-19: qty 10

## 2020-03-19 NOTE — Progress Notes (Signed)
Received call from [facility], RN case manage with Sabrina Woods to introduce herself.  States she will help facilitate home injection of Ziextenzo.  [name]'s contact number is [phone] ext [phone] if the office has any needs.

## 2020-03-19 NOTE — Patient Instructions (Signed)
[facility] Discharge Instructions for Patients Receiving Chemotherapy  Today you received the following chemotherapy agents: Kanjinti, Perjeta, Taxotere, Carboplatin  To help prevent nausea and vomiting after your treatment, we encourage you to take your nausea medication as directed.    If you develop nausea and vomiting that is not controlled by your nausea medication, call the clinic.   BELOW ARE SYMPTOMS THAT SHOULD BE REPORTED IMMEDIATELY:  *FEVER GREATER THAN 100.5 F  *CHILLS WITH OR WITHOUT FEVER  NAUSEA AND VOMITING THAT IS NOT CONTROLLED WITH YOUR NAUSEA MEDICATION  *UNUSUAL SHORTNESS OF BREATH  *UNUSUAL BRUISING OR BLEEDING  TENDERNESS IN MOUTH AND THROAT WITH OR WITHOUT PRESENCE OF ULCERS  *URINARY PROBLEMS  *BOWEL PROBLEMS  UNUSUAL RASH Items with * indicate a potential emergency and should be followed up as soon as possible.  Feel free to call the clinic should you have any questions or concerns. The clinic phone number is [phone].  Please show the [place] at check-in to the Emergency Department and triage nurse.  Carboplatin injection What is this medicine? CARBOPLATIN (KAR boe pla tin) is a chemotherapy drug. It targets fast dividing cells, like cancer cells, and causes these cells to die. This medicine is used to treat ovarian cancer and many other cancers. This medicine may be used for other purposes; ask your health care provider or pharmacist if you have questions. COMMON BRAND NAME(S): Paraplatin What should I tell my health care provider before I take this medicine? They need to know if you have any of these conditions:  blood disorders  hearing problems  kidney disease  recent or ongoing radiation therapy  an unusual or allergic reaction to carboplatin, cisplatin, other chemotherapy, other medicines, foods, dyes, or preservatives  pregnant or trying to get pregnant  breast-feeding How should I use this  medicine? This drug is usually given as an infusion into a vein. It is administered in a hospital or clinic by a specially trained health care professional. Talk to your pediatrician regarding the use of this medicine in children. Special care may be needed. Overdosage: If you think you have taken too much of this medicine contact a poison control center or emergency room at once. NOTE: This medicine is only for you. Do not share this medicine with others. What if I miss a dose? It is important not to miss a dose. Call your doctor or health care professional if you are unable to keep an appointment. What may interact with this medicine?  medicines for seizures  medicines to increase blood counts like filgrastim, pegfilgrastim, sargramostim  some antibiotics like amikacin, gentamicin, neomycin, streptomycin, tobramycin  vaccines Talk to your doctor or health care professional before taking any of these medicines:  acetaminophen  aspirin  ibuprofen  ketoprofen  naproxen This list may not describe all possible interactions. Give your health care provider a list of all the medicines, herbs, non-prescription drugs, or dietary supplements you use. Also tell them if you smoke, drink alcohol, or use illegal drugs. Some items may interact with your medicine. What should I watch for while using this medicine? Your condition will be monitored carefully while you are receiving this medicine. You will need important blood work done while you are taking this medicine. This drug may make you feel generally unwell. This is not uncommon, as chemotherapy can affect healthy cells as well as cancer cells. Report any side effects. Continue your course of treatment even though you feel ill unless your doctor  tells you to stop. In some cases, you may be given additional medicines to help with side effects. Follow all directions for their use. Call your doctor or health care professional for advice if you  get a fever, chills or sore throat, or other symptoms of a cold or flu. Do not treat yourself. This drug decreases your body's ability to fight infections. Try to avoid being around people who are sick. This medicine may increase your risk to bruise or bleed. Call your doctor or health care professional if you notice any unusual bleeding. Be careful brushing and flossing your teeth or using a toothpick because you may get an infection or bleed more easily. If you have any dental work done, tell your dentist you are receiving this medicine. Avoid taking products that contain aspirin, acetaminophen, ibuprofen, naproxen, or ketoprofen unless instructed by your doctor. These medicines may hide a fever. Do not become pregnant while taking this medicine. Women should inform their doctor if they wish to become pregnant or think they might be pregnant. There is a potential for serious side effects to an unborn child. Talk to your health care professional or pharmacist for more information. Do not breast-feed an infant while taking this medicine. What side effects may I notice from receiving this medicine? Side effects that you should report to your doctor or health care professional as soon as possible:  allergic reactions like skin rash, itching or hives, swelling of the face, lips, or tongue  signs of infection - fever or chills, cough, sore throat, pain or difficulty passing urine  signs of decreased platelets or bleeding - bruising, pinpoint red spots on the skin, black, tarry stools, nosebleeds  signs of decreased red blood cells - unusually weak or tired, fainting spells, lightheadedness  breathing problems  changes in hearing  changes in vision  chest pain  high blood pressure  low blood counts - This drug may decrease the number of white blood cells, red blood cells and platelets. You may be at increased risk for infections and bleeding.  nausea and vomiting  pain, swelling, redness or  irritation at the injection site  pain, tingling, numbness in the hands or feet  problems with balance, talking, walking  trouble passing urine or change in the amount of urine Side effects that usually do not require medical attention (report to your doctor or health care professional if they continue or are bothersome):  hair loss  loss of appetite  metallic taste in the mouth or changes in taste This list may not describe all possible side effects. Call your doctor for medical advice about side effects. You may report side effects to FDA at 1-800-FDA-1088. Where should I keep my medicine? This drug is given in a hospital or clinic and will not be stored at home. NOTE: This sheet is a summary. It may not cover all possible information. If you have questions about this medicine, talk to your doctor, pharmacist, or health care provider.  2021 Elsevier/Gold Standard (2007-03-27 14:38:05) Docetaxel injection What is this medicine? DOCETAXEL (doe se TAX el) is a chemotherapy drug. It targets fast dividing cells, like cancer cells, and causes these cells to die. This medicine is used to treat many types of cancers like breast cancer, certain stomach cancers, head and neck cancer, lung cancer, and prostate cancer. This medicine may be used for other purposes; ask your health care provider or pharmacist if you have questions. COMMON BRAND NAME(S): Docefrez, Taxotere What should I tell my  health care provider before I take this medicine? They need to know if you have any of these conditions:  infection (especially a virus infection such as chickenpox, cold sores, or herpes)  liver disease  low blood counts, like low white cell, platelet, or red cell counts  an unusual or allergic reaction to docetaxel, polysorbate 80, other chemotherapy agents, other medicines, foods, dyes, or preservatives  pregnant or trying to get pregnant  breast-feeding How should I use this medicine? This drug  is given as an infusion into a vein. It is administered in a hospital or clinic by a specially trained health care professional. Talk to your pediatrician regarding the use of this medicine in children. Special care may be needed. Overdosage: If you think you have taken too much of this medicine contact a poison control center or emergency room at once. NOTE: This medicine is only for you. Do not share this medicine with others. What if I miss a dose? It is important not to miss your dose. Call your doctor or health care professional if you are unable to keep an appointment. What may interact with this medicine? Do not take this medicine with any of the following medications:  live virus vaccines This medicine may also interact with the following medications:  aprepitant  certain antibiotics like erythromycin or clarithromycin  certain antivirals for HIV or hepatitis  certain medicines for fungal infections like fluconazole, itraconazole, ketoconazole, posaconazole, or voriconazole  cimetidine  ciprofloxacin  conivaptan  cyclosporine  dronedarone  fluvoxamine  grapefruit juice  imatinib  verapamil This list may not describe all possible interactions. Give your health care provider a list of all the medicines, herbs, non-prescription drugs, or dietary supplements you use. Also tell them if you smoke, drink alcohol, or use illegal drugs. Some items may interact with your medicine. What should I watch for while using this medicine? Your condition will be monitored carefully while you are receiving this medicine. You will need important blood work done while you are taking this medicine. Call your doctor or health care professional for advice if you get a fever, chills or sore throat, or other symptoms of a cold or flu. Do not treat yourself. This drug decreases your body's ability to fight infections. Try to avoid being around people who are sick. Some products may contain  alcohol. Ask your health care professional if this medicine contains alcohol. Be sure to tell all health care professionals you are taking this medicine. Certain medicines, like metronidazole and disulfiram, can cause an unpleasant reaction when taken with alcohol. The reaction includes flushing, headache, nausea, vomiting, sweating, and increased thirst. The reaction can last from 30 minutes to several hours. You may get drowsy or dizzy. Do not drive, use machinery, or do anything that needs mental alertness until you know how this medicine affects you. Do not stand or sit up quickly, especially if you are an older patient. This reduces the risk of dizzy or fainting spells. Alcohol may interfere with the effect of this medicine. Talk to your health care professional about your risk of cancer. You may be more at risk for certain types of cancer if you take this medicine. Do not become pregnant while taking this medicine or for 6 months after stopping it. Women should inform their doctor if they wish to become pregnant or think they might be pregnant. There is a potential for serious side effects to an unborn child. Talk to your health care professional or pharmacist for more  information. Do not breast-feed an infant while taking this medicine or for 1 week after stopping it. Males who get this medicine must use a condom during sex with females who can get pregnant. If you get a woman pregnant, the baby could have birth defects. The baby could die before they are born. You will need to continue wearing a condom for 3 months after stopping the medicine. Tell your health care provider right away if your partner becomes pregnant while you are taking this medicine. This may interfere with the ability to father a child. You should talk to your doctor or health care professional if you are concerned about your fertility. What side effects may I notice from receiving this medicine? Side effects that you should report  to your doctor or health care professional as soon as possible:  allergic reactions like skin rash, itching or hives, swelling of the face, lips, or tongue  blurred vision  breathing problems  changes in vision  low blood counts - This drug may decrease the number of white blood cells, red blood cells and platelets. You may be at increased risk for infections and bleeding.  nausea and vomiting  pain, redness or irritation at site where injected  pain, tingling, numbness in the hands or feet  redness, blistering, peeling, or loosening of the skin, including inside the mouth  signs of decreased platelets or bleeding - bruising, pinpoint red spots on the skin, black, tarry stools, nosebleeds  signs of decreased red blood cells - unusually weak or tired, fainting spells, lightheadedness  signs of infection - fever or chills, cough, sore throat, pain or difficulty passing urine  swelling of the ankle, feet, hands Side effects that usually do not require medical attention (report to your doctor or health care professional if they continue or are bothersome):  constipation  diarrhea  fingernail or toenail changes  hair loss  loss of appetite  mouth sores  muscle pain This list may not describe all possible side effects. Call your doctor for medical advice about side effects. You may report side effects to FDA at 1-800-FDA-1088. Where should I keep my medicine? This drug is given in a hospital or clinic and will not be stored at home. NOTE: This sheet is a summary. It may not cover all possible information. If you have questions about this medicine, talk to your doctor, pharmacist, or health care provider.  2021 Elsevier/Gold Standard (2018-11-19 19:50:31) Pertuzumab injection What is this medicine? PERTUZUMAB (per TOOZ ue mab) is a monoclonal antibody. It is used to treat breast cancer. This medicine may be used for other purposes; ask your health care provider or  pharmacist if you have questions. COMMON BRAND NAME(S): PERJETA What should I tell my health care provider before I take this medicine? They need to know if you have any of these conditions:  heart disease  heart failure  high blood pressure  history of irregular heart beat  recent or ongoing radiation therapy  an unusual or allergic reaction to pertuzumab, other medicines, foods, dyes, or preservatives  pregnant or trying to get pregnant  breast-feeding How should I use this medicine? This medicine is for infusion into a vein. It is given by a health care professional in a hospital or clinic setting. Talk to your pediatrician regarding the use of this medicine in children. Special care may be needed. Overdosage: If you think you have taken too much of this medicine contact a poison control center or emergency room at  once. NOTE: This medicine is only for you. Do not share this medicine with others. What if I miss a dose? It is important not to miss your dose. Call your doctor or health care professional if you are unable to keep an appointment. What may interact with this medicine? Interactions are not expected. Give your health care provider a list of all the medicines, herbs, non-prescription drugs, or dietary supplements you use. Also tell them if you smoke, drink alcohol, or use illegal drugs. Some items may interact with your medicine. This list may not describe all possible interactions. Give your health care provider a list of all the medicines, herbs, non-prescription drugs, or dietary supplements you use. Also tell them if you smoke, drink alcohol, or use illegal drugs. Some items may interact with your medicine. What should I watch for while using this medicine? Your condition will be monitored carefully while you are receiving this medicine. Report any side effects. Continue your course of treatment even though you feel ill unless your doctor tells you to stop. Do not  become pregnant while taking this medicine or for 7 months after stopping it. Women should inform their doctor if they wish to become pregnant or think they might be pregnant. Women of child-bearing potential will need to have a negative pregnancy test before starting this medicine. There is a potential for serious side effects to an unborn child. Talk to your health care professional or pharmacist for more information. Do not breast-feed an infant while taking this medicine or for 7 months after stopping it. Women must use effective birth control with this medicine. Call your doctor or health care professional for advice if you get a fever, chills or sore throat, or other symptoms of a cold or flu. Do not treat yourself. Try to avoid being around people who are sick. You may experience fever, chills, and headache during the infusion. Report any side effects during the infusion to your health care professional. What side effects may I notice from receiving this medicine? Side effects that you should report to your doctor or health care professional as soon as possible:  breathing problems  chest pain or palpitations  dizziness  feeling faint or lightheaded  fever or chills  skin rash, itching or hives  sore throat  swelling of the face, lips, or tongue  swelling of the legs or ankles  unusually weak or tired Side effects that usually do not require medical attention (report to your doctor or health care professional if they continue or are bothersome):  diarrhea  hair loss  nausea, vomiting  tiredness This list may not describe all possible side effects. Call your doctor for medical advice about side effects. You may report side effects to FDA at 1-800-FDA-1088. Where should I keep my medicine? This drug is given in a hospital or clinic and will not be stored at home. NOTE: This sheet is a summary. It may not cover all possible information. If you have questions about this  medicine, talk to your doctor, pharmacist, or health care provider.  2021 Elsevier/Gold Standard (2015-01-22 12:08:50) Trastuzumab injection for infusion What is this medicine? TRASTUZUMAB (tras TOO zoo mab) is a monoclonal antibody. It is used to treat breast cancer and stomach cancer. This medicine may be used for other purposes; ask your health care provider or pharmacist if you have questions. COMMON BRAND NAME(S): Herceptin, Galvin Proffer, Trazimera What should I tell my health care provider before I take this medicine? They  need to know if you have any of these conditions:  heart disease  heart failure  lung or breathing disease, like asthma  an unusual or allergic reaction to trastuzumab, benzyl alcohol, or other medications, foods, dyes, or preservatives  pregnant or trying to get pregnant  breast-feeding How should I use this medicine? This drug is given as an infusion into a vein. It is administered in a hospital or clinic by a specially trained health care professional. Talk to your pediatrician regarding the use of this medicine in children. This medicine is not approved for use in children. Overdosage: If you think you have taken too much of this medicine contact a poison control center or emergency room at once. NOTE: This medicine is only for you. Do not share this medicine with others. What if I miss a dose? It is important not to miss a dose. Call your doctor or health care professional if you are unable to keep an appointment. What may interact with this medicine? This medicine may interact with the following medications:  certain types of chemotherapy, such as daunorubicin, doxorubicin, epirubicin, and idarubicin This list may not describe all possible interactions. Give your health care provider a list of all the medicines, herbs, non-prescription drugs, or dietary supplements you use. Also tell them if you smoke, drink alcohol, or use  illegal drugs. Some items may interact with your medicine. What should I watch for while using this medicine? Visit your doctor for checks on your progress. Report any side effects. Continue your course of treatment even though you feel ill unless your doctor tells you to stop. Call your doctor or health care professional for advice if you get a fever, chills or sore throat, or other symptoms of a cold or flu. Do not treat yourself. Try to avoid being around people who are sick. You may experience fever, chills and shaking during your first infusion. These effects are usually mild and can be treated with other medicines. Report any side effects during the infusion to your health care professional. Fever and chills usually do not happen with later infusions. Do not become pregnant while taking this medicine or for 7 months after stopping it. Women should inform their doctor if they wish to become pregnant or think they might be pregnant. Women of child-bearing potential will need to have a negative pregnancy test before starting this medicine. There is a potential for serious side effects to an unborn child. Talk to your health care professional or pharmacist for more information. Do not breast-feed an infant while taking this medicine or for 7 months after stopping it. Women must use effective birth control with this medicine. What side effects may I notice from receiving this medicine? Side effects that you should report to your doctor or health care professional as soon as possible:  allergic reactions like skin rash, itching or hives, swelling of the face, lips, or tongue  chest pain or palpitations  cough  dizziness  feeling faint or lightheaded, falls  fever  general ill feeling or flu-like symptoms  signs of worsening heart failure like breathing problems; swelling in your legs and feet  unusually weak or tired Side effects that usually do not require medical attention (report to  your doctor or health care professional if they continue or are bothersome):  bone pain  changes in taste  diarrhea  joint pain  nausea/vomiting  weight loss This list may not describe all possible side effects. Call your doctor for medical advice  about side effects. You may report side effects to FDA at 1-800-FDA-1088. Where should I keep my medicine? This drug is given in a hospital or clinic and will not be stored at home. NOTE: This sheet is a summary. It may not cover all possible information. If you have questions about this medicine, talk to your doctor, pharmacist, or health care provider.  2021 Elsevier/Gold Standard (2015-12-15 14:37:52)

## 2020-03-20 ENCOUNTER — Other Ambulatory Visit (HOSPITAL_COMMUNITY): Payer: Managed Care, Other (non HMO)

## 2020-03-21 ENCOUNTER — Inpatient Hospital Stay: Payer: Managed Care, Other (non HMO)

## 2020-03-21 ENCOUNTER — Other Ambulatory Visit: Payer: Self-pay

## 2020-03-21 VITALS — BP 164/87 | HR 86 | Temp 97.8°F | Resp 20 | Ht 66.45 in

## 2020-03-21 DIAGNOSIS — Z5112 Encounter for antineoplastic immunotherapy: Secondary | ICD-10-CM | POA: Diagnosis not present

## 2020-03-21 MED ORDER — PEGFILGRASTIM-BMEZ 6 MG/0.6ML ~~LOC~~ SOSY
6.0000 mg | PREFILLED_SYRINGE | Freq: Once | SUBCUTANEOUS | Status: AC
  Administered 2020-03-21: 6 mg via SUBCUTANEOUS

## 2020-03-23 ENCOUNTER — Ambulatory Visit: Payer: Managed Care, Other (non HMO) | Attending: Hematology and Oncology

## 2020-03-23 ENCOUNTER — Other Ambulatory Visit: Payer: Self-pay

## 2020-03-23 DIAGNOSIS — C50411 Malignant neoplasm of upper-outer quadrant of right female breast: Secondary | ICD-10-CM | POA: Diagnosis not present

## 2020-03-23 DIAGNOSIS — Z170 Estrogen receptor positive status [ER+]: Secondary | ICD-10-CM | POA: Insufficient documentation

## 2020-03-23 DIAGNOSIS — R293 Abnormal posture: Secondary | ICD-10-CM | POA: Diagnosis present

## 2020-03-23 NOTE — Therapy (Signed)
[place], [place], [address] Phone: [phone]   Fax:  [phone]  Physical Therapy Evaluation  Patient Details  Name: Sabrina Woods MRN: [id number] Date of Birth: July 02, 1955 Referring Provider (PT): Dr. Lindi Adie   Encounter Date: 03/23/2020   PT End of Session - 03/23/20 1204    Visit Number 1    Number of Visits 2    Date for PT Re-Evaluation 08/24/20    PT Start Time 1106    PT Stop Time 1155    PT Time Calculation (min) 49 min    Activity Tolerance Patient tolerated treatment well    Behavior During Therapy [facility] for tasks assessed/performed           Past Medical History:  Diagnosis Date  . Acid reflux   . Depression    HISTORY OF  . Fibroid   . PONV (postoperative nausea and vomiting)   . Smoker     Past Surgical History:  Procedure Laterality Date  . CESAREAN SECTION     X 2  . PORTACATH PLACEMENT Left 03/17/2020   Procedure: INSERTION PORT-A-CATH;  Surgeon: Rolm Bookbinder, MD;  Location: [place];  Service: General;  Laterality: Left;    There were no vitals filed for this visit.        [facility] PT Assessment - 03/23/20 0001      Assessment   Medical Diagnosis Right Breast Cancer    Referring Provider (PT) Dr. Lindi Adie    Onset Date/Surgical Date --   onset 02/25/2020.  Surgical date not yet set   Hand Dominance Right    Prior Therapy no      Precautions   Precaution Comments will be lymphedema risk after surgery      Restrictions   Weight Bearing Restrictions No      Balance Screen   Has the patient fallen in the past 6 months No    Has the patient had a decrease in activity level because of a fear of falling?  No    Is the patient reluctant to leave their home because of a fear of falling?  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    Leisure watch movies      Cognition   Overall Cognitive Status Within Functional Limits for tasks assessed      Observation/Other Assessments   Skin Integrity WNL      Coordination   Gross Motor Movements are Fluid and Coordinated Yes      Posture/Postural Control   Posture/Postural Control Postural limitations    Postural Limitations Rounded Shoulders;Forward head      AROM   AROM Assessment Site Shoulder    Right/Left Shoulder Right;Left    Right Shoulder Extension 58 Degrees    Right Shoulder Flexion 140 Degrees    Right Shoulder ABduction 150 Degrees    Right Shoulder Internal Rotation 65 Degrees    Right Shoulder External Rotation 98 Degrees    Left Shoulder Extension 59 Degrees    Left Shoulder Flexion 150 Degrees    Left Shoulder ABduction 147 Degrees    Left Shoulder Internal Rotation 65 Degrees    Left Shoulder External  Rotation 95 Degrees             LYMPHEDEMA/ONCOLOGY QUESTIONNAIRE - 03/23/20 0001      Treatment   Active Chemotherapy Treatment Yes    Date 03/19/20    Past Chemotherapy Treatment No    Active Radiation Treatment --   will have   Past Radiation Treatment No    Current Hormone Treatment No    Past Hormone Therapy No      What other symptoms do you have   Are you Having Heaviness or Tightness No    Are you having Pain Yes   in breast mainly with sleeping and intermittent   Are you having pitting edema No    Is it Hard or Difficult finding clothes that fit No    Do you have infections No    Is there Decreased scar mobility No    [place] Sign No      Right Upper Extremity Lymphedema   15 cm Proximal to Olecranon Process 34.1 cm    10 cm Proximal to Olecranon Process 31.1 cm    Olecranon Process 29 cm    15 cm Proximal to Ulnar Styloid Process 26.7 cm    10 cm Proximal to Ulnar Styloid Process  23.6 cm    Just Proximal to Ulnar Styloid Process 17.2 cm    Across Hand at [employer] 20.5 cm    At [place] of 2nd Digit 6.4 cm      Left Upper Extremity Lymphedema   15 cm Proximal to Olecranon Process 34.6 cm    10 cm Proximal to Olecranon Process 32.5 cm    Olecranon Process 28.4 cm    15 cm Proximal to Ulnar Styloid Process 26.3 cm    10 cm Proximal to Ulnar Styloid Process 22.3 cm    Just Proximal to Ulnar Styloid Process 17.7 cm    Across Hand at [employer] 19.4 cm    At [place] of 2nd Digit 6.5 cm           L-DEX FLOWSHEETS - 03/23/20 1200      L-DEX LYMPHEDEMA SCREENING   Measurement Type Unilateral    L-DEX MEASUREMENT EXTREMITY Upper Extremity    POSITION  Standing    DOMINANT SIDE Right    At Risk Side Right    BASELINE SCORE (UNILATERAL) 3.5                Quick Dash - 03/23/20 0001    Open a tight or new jar Mild difficulty    Do heavy household chores (wash walls, wash floors) No difficulty    Carry a shopping bag or briefcase No difficulty    Use a knife to cut food No difficulty    Recreational activities in which you take some force or impact through your arm, shoulder, or hand (golf, hammering, tennis) No difficulty    During the past week, to what extent has your arm, shoulder or hand problem interfered with your normal social activities with family, friends, neighbors, or groups? Not at all    During the past week, to what extent has your arm, shoulder or hand problem limited your work or other regular daily activities Slightly    Arm, shoulder, or hand pain. Mild    Tingling (pins and needles) in your arm, shoulder, or hand None    Difficulty Sleeping Mild difficulty    DASH Score 6.82 %  Objective measurements completed on examination: See above findings.                    PT Long Term Goals - 03/23/20 1219      PT LONG TERM GOAL #1   Title Pts post surgical shoulder ROM and function will be equal to  pre-surgical ROM    Time 22    Period Weeks    Status New    Target Date 08/24/20           Breast Clinic Goals - 03/23/20 1217      Patient will be able to verbalize understanding of pertinent lymphedema risk reduction practices relevant to her diagnosis specifically related to skin care.   Time 1    Period Days    Status Achieved    Target Date 03/23/20      Patient will be able to return demonstrate and/or verbalize understanding of the post-op home exercise program related to regaining shoulder range of motion.   Time 1    Period Days    Status Achieved    Target Date 03/23/20      Patient will be able to verbalize understanding of the importance of attending the postoperative After Breast Cancer Class for further lymphedema risk reduction education and therapeutic exercise.   Time 1    Period Days    Status Achieved    Target Date 03/23/20                 Plan - 03/23/20 1206    Clinical Impression Statement pt was seen for pre-surgical screening.  She is presently having neoadjuvant chemotherapy for 18 weeks and then a date will be set.  Baseline values for taken for bilateral shoulder ROM, circumference measures and SOZO.  she was instructed in precautions for lymphedema and skin care, and she was educated in 4 post-op exercises and ABC class.Marland Kitchen  She was educated to set up appt here for follow-up as soon as she gets her surgical date.  At that time we will arrange ABC class, and SOZO follow up as well    Stability/Clinical Decision Making Stable/Uncomplicated    Rehab Potential Excellent    PT Frequency 1x / week    PT Duration Other (comment)   22 weeks secondary to neoadjuvant chemo   PT Treatment/Interventions Patient/family education    PT Next Visit Plan Reassess after surgery, schedule ABC/SOZO if not done already, determine need for further PT    PT Home Exercise Plan 4 post op exs to perform as allowed by MD and after drains are removed, or a week after  surgery if lumpectomy    Recommended Other Services ABC, SOZO follow ups    Consulted and Agree with Plan of Care Patient         Patient will follow up at outpatient cancer rehab 3-4 weeks following surgery.  If the patient requires physical therapy at that time, a specific plan will be dictated and sent to the referring physician for approval. The patient was educated today on appropriate basic range of motion exercises to begin post operatively and the importance of attending the After Breast Cancer class following surgery.  Patient was educated today on lymphedema risk reduction practices as it pertains to recommendations that will benefit the patient immediately following surgery.  She verbalized good understanding.     Patient will benefit from skilled therapeutic intervention in order to improve the following deficits and impairments:  Decreased knowledge  of precautions,Postural dysfunction  Visit Diagnosis: Malignant neoplasm of upper-outer quadrant of right breast in female, estrogen receptor positive ([place])  Abnormal posture     Problem List Patient Active Problem List   Diagnosis Date Noted  . Port-A-Cath in place 03/19/2020  . Malignant neoplasm of upper-outer quadrant of right breast in female, estrogen receptor positive ([place]) 03/10/2020  . Fibroid   . Smoker   . Depression     Sabrina Woods 03/23/2020, 12:30 PM  [place], [place], [address] Phone: [phone]   Fax:  [phone]  Name: Sabrina Woods MRN: [id number] Date of Birth: 09/29/1955 Cheral Almas, PT 03/23/20 12:31 PM

## 2020-03-23 NOTE — Patient Instructions (Signed)
Physical Therapy Information for After Breast Cancer Surgery/Treatment:   Lymphedema is a swelling condition that you may be at risk for in your arm if you have lymph nodes removed from the armpit area.  After a sentinel node biopsy, the risk is approximately 5-9% and is higher after an axillary node dissection.  There is treatment available for this condition and it is not life-threatening.  Contact your physician or physical therapist with concerns.  You may begin the 4 shoulder/posture exercises (see additional sheet) when permitted by your physician (typically a week after surgery).  If you have drains, you may need to wait until those are removed before beginning range of motion exercises.  A general recommendation is to not lift your arms above shoulder height until drains are removed.  These exercises should be done to your tolerance and gently.  This is not a "no pain/no gain" type of recovery so listen to your body and stretch into the range of motion that you can tolerate, stopping if you have pain.  If you are having immediate reconstruction, ask your plastic surgeon about doing exercises as he or she may want you to wait.  We encourage you to attend the free one time ABC (After Breast Cancer) class offered by  [facility].  You will learn information related to lymphedema risk, prevention and treatment and additional exercises to regain mobility following surgery.  You can call [phone] for more information.  This is offered the 1st and 3rd Monday of each month.  You only attend the class one time.  While undergoing any medical procedure or treatment, try to avoid blood pressure being taken or needle sticks from occurring on the arm on the side of cancer.   This recommendation begins after surgery and continues for the rest of your life.  This may help reduce your risk of getting lymphedema (swelling in your arm).  An excellent resource for those seeking information  on lymphedema is the National Lymphedema Network's web site. It can be accessed at www.lymphnet.org  If you notice swelling in your hand, arm or breast at any time following surgery (even if it is many years from now), please contact your doctor or physical therapist to discuss this.  Lymphedema can be treated at any time but it is easier for you if it is treated early on.  If you feel like your shoulder motion is not returning to normal in a reasonable amount of time, please contact your surgeon or physical therapist.  Sabrina Woods, PT, CLT [phone]; [address] [place], [place] [address] ABC CLASS After Breast Cancer Class  After Breast Cancer Class is a specially designed exercise class to assist you in a safe recover after having breast cancer surgery.  In this class you will learn how to get back to full function whether your drains were just removed or if you had surgery a month ago.  This one-time class is held the 1st and 3rd Monday of every month from 11:00 a.m. until 12:00 noon at the [facility] located at [address] [place], [place] [address]  This class is FREE and space is limited. For more information or to register for the next available class, call [phone].  Class Goals   Understand specific stretches to improve the flexibility of you chest and shoulder.  Learn ways to safely strengthen your upper body and improve your posture.  Understand the warning signs of infection and why   you may be at risk for an arm infection.  Learn about Lymphedema and prevention.  ** You do not attend this class until after surgery.  Drains must be removed to participate  Patient was instructed today in a home exercise program today for post op shoulder range of motion. These included active assist shoulder flexion in sitting/supine, scapular retraction, wall walking with shoulder abduction, and hands behind head external rotation to be done  supine.  She was encouraged to do these twice a day, holding 3 seconds and repeating 5 times when permitted by her physician.

## 2020-03-24 ENCOUNTER — Telehealth: Payer: Self-pay | Admitting: *Deleted

## 2020-03-24 NOTE — Assessment & Plan Note (Signed)
03/04/2020:Palpable right breast mass x6 months. Mammogram revealed left breast cysts (intraductal papilloma), 11 cm right breast mass and 2 masses 2.6 cm each in the right axilla: Biopsy grade 3 IDC ER 5% weak, PR 0%, Ki-67 25%, HER-2 3+ IHC positive  Treatment Plan: 1. Neoadjuvant chemotherapy with TCH Perjeta 6 cycles followed by Herceptin Perjeta maintenance versus Kadcyla maintenance (based on response to neoadjuvant chemo) for 1 year 2. Followed bymastectomywith sentinel lymph node study 3. Followed by adjuvant radiation therapy 4.Followed by antiestrogen therapy 5.Consideration for neratinib ----------------------------------------------------------------------------------------------------------------- 03/15/20: Bone Scan: Subtle uptake in ribs: Non diagnostic 03/16/20: CT CAP: Large Rt breast mass with skin thickening, small sat lesion 1.8 cm, multiple enlarged LN, 4 mm LLL nodule ---------------------------------------------------------------------------------------------------------------- Current Treatment: Cycle 1 day 8 TCHP ECHO 03/16/20: EF 65-70% Labs reviewed. Chemo toxicities:   RTC in 2 weeks for cycle 2  RTC in 1 week for tox check

## 2020-03-24 NOTE — Progress Notes (Signed)
Patient Care Team: [name], MD as PCP - General (Hematology and Oncology) Mauro Kaufmann, RN as Oncology Nurse Navigator Rockwell Germany, RN as Oncology Nurse Navigator  DIAGNOSIS:    ICD-10-CM   1. Malignant neoplasm of upper-outer quadrant of right breast in female, estrogen receptor positive ([place])  C50.411    Z17.0     SUMMARY OF ONCOLOGIC HISTORY: Oncology History  Malignant neoplasm of upper-outer quadrant of right breast in female, estrogen receptor positive ([place])  03/10/2020 Initial Diagnosis   Palpable right breast mass x6 months.  Mammogram revealed left breast cysts (intraductal papilloma), 11 cm right breast mass and 2 masses 2.6 cm each in the right axilla: Biopsy grade 3 IDC ER 5% weak, PR 0%, Ki-67 25%, HER-2 3+ IHC positive   03/10/2020 Cancer Staging   Staging form: Breast, AJCC 8th Edition - Clinical stage from 03/10/2020: Stage IIIA (cT3, cN1, cM0, G3, ER+, PR-, HER2+) - Signed by [name], MD on 03/10/2020 Stage prefix: Initial diagnosis   03/19/2020 -  Chemotherapy    Patient is on Treatment Plan: BREAST  DOCETAXEL + CARBOPLATIN + TRASTUZUMAB + PERTUZUMAB  (TCHP) Q21D         CHIEF COMPLIANT: Cycle 1 Day 7 TCHP  INTERVAL HISTORY: Sabrina Woods is a 65 y.o. with above-mentioned history of right breast cancer currently on neoadjuvant chemotherapy with [place]. She presents to the clinic today for treatment.  She tolerated chemotherapy extremely well.  She had couple of days of diarrhea.  She also found some sores on her vaginal exterior.  Complains of nausea but no vomiting.  Compazine appears to have helped significantly.  She had one episode of gastritis.  ALLERGIES:  has No Known Allergies.  MEDICATIONS:  Current Outpatient Medications  Medication Sig Dispense Refill  . ALPRAZolam (XANAX) 0.25 MG tablet Take 1 tablet (0.25 mg total) by mouth 2 (two) times daily as needed for anxiety. 60 tablet 3  . dexamethasone (DECADRON) 4 MG tablet Take 1  tablet (4 mg total) by mouth daily. Take 1 tablet day before chemo and 1 tablet day after chemo with food 30 tablet 1  . esomeprazole (NEXIUM) 20 MG capsule Take by mouth daily.    Marland Kitchen lidocaine-prilocaine (EMLA) cream Apply to affected area once 30 g 3  . Multiple Vitamin (MULTIVITAMIN) capsule Take 1 capsule by mouth daily.    . nicotine (NICODERM CQ) 14 mg/24hr patch Place 1 patch (14 mg total) onto the skin daily. 14 patch 0  . nicotine (NICODERM CQ) 7 mg/24hr patch Place 1 patch (7 mg total) onto the skin daily. 28 patch 0  . ondansetron (ZOFRAN) 8 MG tablet Take 1 tablet (8 mg total) by mouth 2 (two) times daily as needed (Nausea or vomiting). Start on the third day after chemotherapy. 30 tablet 1  . prochlorperazine (COMPAZINE) 10 MG tablet Take 1 tablet (10 mg total) by mouth every 6 (six) hours as needed (Nausea or vomiting). 30 tablet 1  . venlafaxine XR (EFFEXOR-XR) 37.5 MG 24 hr capsule Take 1 capsule (37.5 mg total) by mouth daily with breakfast. 30 capsule 6   Current Facility-Administered Medications  Medication Dose Route Frequency Provider Last Rate Last Admin  . levonorgestrel (MIRENA) 20 MCG/24HR IUD   Intrauterine Once Bennetta Laos, MD        PHYSICAL EXAMINATION: ECOG PERFORMANCE STATUS: 1 - Symptomatic but completely ambulatory  There were no vitals filed for this visit. There were no vitals filed for this  visit.  LABORATORY DATA:  I have reviewed the data as listed CMP Latest Ref Rng & Units 03/19/2020 03/12/2020  Glucose 70 - 99 mg/dL 293(H) 319(H)  BUN 8 - 23 mg/dL 13 7(L)  Creatinine 0.44 - 1.00 mg/dL 0.81 0.81  Sodium 135 - 145 mmol/L 137 134(L)  Potassium 3.5 - 5.1 mmol/L 3.8 4.0  Chloride 98 - 111 mmol/L 103 105  CO2 22 - 32 mmol/L 25 22  Calcium 8.9 - 10.3 mg/dL 9.7 9.2  Total Protein 6.5 - 8.1 g/dL 7.3 7.0  Total Bilirubin 0.3 - 1.2 mg/dL 0.2(L) 0.3  Alkaline Phos 38 - 126 U/L 71 76  AST 15 - 41 U/L 12(L) 14(L)  ALT 0 - 44 U/L 17 18    Lab  Results  Component Value Date   WBC 6.5 03/25/2020   HGB 12.3 03/25/2020   HCT 37.1 03/25/2020   MCV 91.2 03/25/2020   PLT 251 03/25/2020   NEUTROABS PENDING 03/25/2020    ASSESSMENT & PLAN:  Malignant neoplasm of upper-outer quadrant of right breast in female, estrogen receptor positive ([place]) 03/04/2020:Palpable right breast mass x6 months. Mammogram revealed left breast cysts (intraductal papilloma), 11 cm right breast mass and 2 masses 2.6 cm each in the right axilla: Biopsy grade 3 IDC ER 5% weak, PR 0%, Ki-67 25%, HER-2 3+ IHC positive  Treatment Plan: 1. Neoadjuvant chemotherapy with TCH Perjeta 6 cycles followed by Herceptin Perjeta maintenance versus Kadcyla maintenance (based on response to neoadjuvant chemo) for 1 year 2. Followed bymastectomywith sentinel lymph node study 3. Followed by adjuvant radiation therapy 4.Followed by antiestrogen therapy 5.Consideration for neratinib ----------------------------------------------------------------------------------------------------------------- 03/15/20: Bone Scan: Subtle uptake in ribs: Non diagnostic 03/16/20: CT CAP: Large Rt breast mass with skin thickening, small sat lesion 1.8 cm, multiple enlarged LN, 4 mm LLL nodule ---------------------------------------------------------------------------------------------------------------- Current Treatment: Cycle 1 day 8 TCHP ECHO 03/16/20: EF 65-70% Labs reviewed. Chemo toxicities: 1.  Nausea: Improved with Compazine 2. diarrhea: Mild instructed to take Imodium 3.  Fatigue 4.  Sores on the bottom: Instructed to use diaper rash cream   RTC in 2 weeks for cycle 2    No orders of the defined types were placed in this encounter.  The patient has a good understanding of the overall plan. she agrees with it. she will call with any problems that may develop before the next visit here.  Total time spent: 30 mins including face to face time and time spent for planning, charting  and coordination of care  Rulon Eisenmenger, MD, MPH 03/25/2020  I, Molly Dorshimer, am acting as scribe for Dr. [name].  I have reviewed the above documentation for accuracy and completeness, and I agree with the above.

## 2020-03-24 NOTE — Telephone Encounter (Signed)
Received vm call from pt asking about effexor script & what it is for.  Explained that it is an anti-depressant sometimes given for hot flashes.  She reports mild hot flashes.  She will discuss script with Dr Lindi Adie.  We also discussed anti nausea meds & how to take.  She reports that this has been her worst day so far.  She will discuss with Dr Lindi Adie tomorrow.

## 2020-03-25 ENCOUNTER — Other Ambulatory Visit: Payer: Self-pay

## 2020-03-25 ENCOUNTER — Inpatient Hospital Stay (HOSPITAL_BASED_OUTPATIENT_CLINIC_OR_DEPARTMENT_OTHER): Payer: Managed Care, Other (non HMO) | Admitting: Hematology and Oncology

## 2020-03-25 ENCOUNTER — Encounter: Payer: Self-pay | Admitting: *Deleted

## 2020-03-25 ENCOUNTER — Inpatient Hospital Stay: Payer: Managed Care, Other (non HMO)

## 2020-03-25 DIAGNOSIS — Z5112 Encounter for antineoplastic immunotherapy: Secondary | ICD-10-CM | POA: Diagnosis not present

## 2020-03-25 DIAGNOSIS — Z170 Estrogen receptor positive status [ER+]: Secondary | ICD-10-CM

## 2020-03-25 DIAGNOSIS — C50411 Malignant neoplasm of upper-outer quadrant of right female breast: Secondary | ICD-10-CM | POA: Diagnosis not present

## 2020-03-25 DIAGNOSIS — Z95828 Presence of other vascular implants and grafts: Secondary | ICD-10-CM

## 2020-03-25 LAB — CMP (CANCER CENTER ONLY)
ALT: 18 U/L (ref 0–44)
AST: 14 U/L — ABNORMAL LOW (ref 15–41)
Albumin: 3.5 g/dL (ref 3.5–5.0)
Alkaline Phosphatase: 92 U/L (ref 38–126)
Anion gap: 11 (ref 5–15)
BUN: 9 mg/dL (ref 8–23)
CO2: 24 mmol/L (ref 22–32)
Calcium: 9.1 mg/dL (ref 8.9–10.3)
Chloride: 102 mmol/L (ref 98–111)
Creatinine: 0.75 mg/dL (ref 0.44–1.00)
GFR, Estimated: >60 mL/min (ref >60)
Glucose, Bld: 227 mg/dL — ABNORMAL HIGH (ref 70–99)
Potassium: 3.6 mmol/L (ref 3.5–5.1)
Sodium: 137 mmol/L (ref 135–145)
Total Bilirubin: 0.3 mg/dL (ref 0.3–1.2)
Total Protein: 6.4 g/dL — ABNORMAL LOW (ref 6.5–8.1)

## 2020-03-25 LAB — CBC WITH DIFFERENTIAL (CANCER CENTER ONLY)
Abs Immature Granulocytes: 0.12 10*3/uL — ABNORMAL HIGH (ref 0.00–0.07)
Basophils Absolute: 0.1 10*3/uL (ref 0.0–0.1)
Basophils Relative: 1 %
Eosinophils Absolute: 0 10*3/uL (ref 0.0–0.5)
Eosinophils Relative: 0 %
HCT: 37.1 % (ref 36.0–46.0)
Hemoglobin: 12.3 g/dL (ref 12.0–15.0)
Immature Granulocytes: 2 %
Lymphocytes Relative: 25 %
Lymphs Abs: 1.6 10*3/uL (ref 0.7–4.0)
MCH: 30.2 pg (ref 26.0–34.0)
MCHC: 33.2 g/dL (ref 30.0–36.0)
MCV: 91.2 fL (ref 80.0–100.0)
Monocytes Absolute: 1.2 10*3/uL — ABNORMAL HIGH (ref 0.1–1.0)
Monocytes Relative: 19 %
Neutro Abs: 3.4 10*3/uL (ref 1.7–7.7)
Neutrophils Relative %: 53 %
Platelet Count: 251 10*3/uL (ref 150–400)
RBC: 4.07 MIL/uL (ref 3.87–5.11)
RDW: 12.1 % (ref 11.5–15.5)
WBC Count: 6.5 10*3/uL (ref 4.0–10.5)
nRBC: 0 % (ref 0.0–0.2)

## 2020-03-25 MED ORDER — SODIUM CHLORIDE 0.9% FLUSH
10.0000 mL | Freq: Once | INTRAVENOUS | Status: AC
  Administered 2020-03-25: 10 mL
  Filled 2020-03-25: qty 10

## 2020-03-25 MED ORDER — HEPARIN SOD (PORK) LOCK FLUSH 100 UNIT/ML IV SOLN
500.0000 [IU] | Freq: Once | INTRAVENOUS | Status: AC
  Administered 2020-03-25: 500 [IU]
  Filled 2020-03-25: qty 5

## 2020-03-25 NOTE — Patient Instructions (Signed)
Implanted Port Insertion, Care After This sheet gives you information about how to care for yourself after your procedure. Your health care provider may also give you more specific instructions. If you have problems or questions, contact your health care provider. What can I expect after the procedure? After the procedure, it is common to have:  Discomfort at the port insertion site.  Bruising on the skin over the port. This should improve over 3-4 days. Follow these instructions at home: Port care  After your port is placed, you will get a manufacturer's information card. The card has information about your port. Keep this card with you at all times.  Take care of the port as told by your health care provider. Ask your health care provider if you or a family member can get training for taking care of the port at home. A home health care nurse may also take care of the port.  Make sure to remember what type of port you have. Incision care  Follow instructions from your health care provider about how to take care of your port insertion site. Make sure you: ? Wash your hands with soap and water before and after you change your bandage (dressing). If soap and water are not available, use hand sanitizer. ? Change your dressing as told by your health care provider. ? Leave stitches (sutures), skin glue, or adhesive strips in place. These skin closures may need to stay in place for 2 weeks or longer. If adhesive strip edges start to loosen and curl up, you may trim the loose edges. Do not remove adhesive strips completely unless your health care provider tells you to do that.  Check your port insertion site every day for signs of infection. Check for: ? Redness, swelling, or pain. ? Fluid or blood. ? Warmth. ? Pus or a bad smell.      Activity  Return to your normal activities as told by your health care provider. Ask your health care provider what activities are safe for you.  Do not  lift anything that is heavier than 10 lb (4.5 kg), or the limit that you are told, until your health care provider says that it is safe. General instructions  Take over-the-counter and prescription medicines only as told by your health care provider.  Do not take baths, swim, or use a hot tub until your health care provider approves. Ask your health care provider if you may take showers. You may only be allowed to take sponge baths.  Do not drive for 24 hours if you were given a sedative during your procedure.  Wear a medical alert bracelet in case of an emergency. This will tell any health care providers that you have a port.  Keep all follow-up visits as told by your health care provider. This is important. Contact a health care provider if:  You cannot flush your port with saline as directed, or you cannot draw blood from the port.  You have a fever or chills.  You have redness, swelling, or pain around your port insertion site.  You have fluid or blood coming from your port insertion site.  Your port insertion site feels warm to the touch.  You have pus or a bad smell coming from the port insertion site. Get help right away if:  You have chest pain or shortness of breath.  You have bleeding from your port that you cannot control. Summary  Take care of the port as told by your   health care provider. Keep the manufacturer's information card with you at all times.  Change your dressing as told by your health care provider.  Contact a health care provider if you have a fever or chills or if you have redness, swelling, or pain around your port insertion site.  Keep all follow-up visits as told by your health care provider. This information is not intended to replace advice given to you by your health care provider. Make sure you discuss any questions you have with your health care provider. Document Revised: 07/18/2017 Document Reviewed: 07/18/2017 Elsevier Patient Education   2021 Elsevier Inc.  

## 2020-03-31 ENCOUNTER — Encounter: Payer: Self-pay | Admitting: Hematology and Oncology

## 2020-03-31 NOTE — Progress Notes (Signed)
The approval from [place] is for Ziextenzo and enrollment date is 03/23/20 - 01/02/21 with claim eligibility date as 11/24/19.

## 2020-03-31 NOTE — Progress Notes (Signed)
Received approval from [place] for up to $10,000, leaving patient with a $0 copay for all doses after insurance pays. Not sure whom applied on patient's behalf.   A copy will be given to Lenise if needed for billing/claim submissions.Marland Kitchen

## 2020-04-07 NOTE — Progress Notes (Signed)
Patient Care Team: [name], MD as PCP - General (Hematology and Oncology) Mauro Kaufmann, RN as Oncology Nurse Navigator Rockwell Germany, RN as Oncology Nurse Navigator  DIAGNOSIS:    ICD-10-CM   1. Malignant neoplasm of upper-outer quadrant of right breast in female, estrogen receptor positive ([place])  C50.411    Z17.0     SUMMARY OF ONCOLOGIC HISTORY: Oncology History  Malignant neoplasm of upper-outer quadrant of right breast in female, estrogen receptor positive ([place])  03/10/2020 Initial Diagnosis   Palpable right breast mass x6 months.  Mammogram revealed left breast cysts (intraductal papilloma), 11 cm right breast mass and 2 masses 2.6 cm each in the right axilla: Biopsy grade 3 IDC ER 5% weak, PR 0%, Ki-67 25%, HER-2 3+ IHC positive   03/10/2020 Cancer Staging   Staging form: Breast, AJCC 8th Edition - Clinical stage from 03/10/2020: Stage IIIA (cT3, cN1, cM0, G3, ER+, PR-, HER2+) - Signed by [name], MD on 03/10/2020 Stage prefix: Initial diagnosis   03/19/2020 -  Chemotherapy    Patient is on Treatment Plan: BREAST  DOCETAXEL + CARBOPLATIN + TRASTUZUMAB + PERTUZUMAB  (TCHP) Q21D         CHIEF COMPLIANT: Cycle 2 Day 1 TCHP  INTERVAL HISTORY: Sabrina Woods is a 65 y.o. with above-mentioned history of right breast cancer currently on neoadjuvant chemotherapy with [place]. She presents to the clinic todayfor treatment.   She did great for the last 2 weeks.  Did not have any further issues with diarrhea.  Her taste and appetite have improved.  No further issues with mouth sores.  ALLERGIES:  has No Known Allergies.  MEDICATIONS:  Current Outpatient Medications  Medication Sig Dispense Refill  . ALPRAZolam (XANAX) 0.25 MG tablet Take 1 tablet (0.25 mg total) by mouth 2 (two) times daily as needed for anxiety. 60 tablet 3  . dexamethasone (DECADRON) 4 MG tablet Take 1 tablet (4 mg total) by mouth daily. Take 1 tablet day before chemo and 1 tablet day after  chemo with food 30 tablet 1  . esomeprazole (NEXIUM) 20 MG capsule Take by mouth daily.    Marland Kitchen lidocaine-prilocaine (EMLA) cream Apply to affected area once 30 g 3  . Multiple Vitamin (MULTIVITAMIN) capsule Take 1 capsule by mouth daily.    . nicotine (NICODERM CQ) 14 mg/24hr patch Place 1 patch (14 mg total) onto the skin daily. 14 patch 0  . nicotine (NICODERM CQ) 7 mg/24hr patch Place 1 patch (7 mg total) onto the skin daily. 28 patch 0  . ondansetron (ZOFRAN) 8 MG tablet Take 1 tablet (8 mg total) by mouth 2 (two) times daily as needed (Nausea or vomiting). Start on the third day after chemotherapy. 30 tablet 1  . prochlorperazine (COMPAZINE) 10 MG tablet Take 1 tablet (10 mg total) by mouth every 6 (six) hours as needed (Nausea or vomiting). 30 tablet 1   Current Facility-Administered Medications  Medication Dose Route Frequency Provider Last Rate Last Admin  . levonorgestrel (MIRENA) 20 MCG/24HR IUD   Intrauterine Once Bennetta Laos, MD        PHYSICAL EXAMINATION: ECOG PERFORMANCE STATUS: 1 - Symptomatic but completely ambulatory  Vitals:   04/08/20 0851  BP: (!) 149/69  Pulse: 96  Resp: 19  Temp: (!) 97.5 F (36.4 C)  SpO2: 98%   Filed Weights   04/08/20 0851  Weight: 204 lb 8 oz (92.8 kg)    LABORATORY DATA:  I have reviewed the data  as listed CMP Latest Ref Rng & Units 03/25/2020 03/19/2020 03/12/2020  Glucose 70 - 99 mg/dL 227(H) 293(H) 319(H)  BUN 8 - 23 mg/dL 9 13 7(L)  Creatinine 0.44 - 1.00 mg/dL 0.75 0.81 0.81  Sodium 135 - 145 mmol/L 137 137 134(L)  Potassium 3.5 - 5.1 mmol/L 3.6 3.8 4.0  Chloride 98 - 111 mmol/L 102 103 105  CO2 22 - 32 mmol/L $RemoveB'24 25 22  'feRpwtso$ Calcium 8.9 - 10.3 mg/dL 9.1 9.7 9.2  Total Protein 6.5 - 8.1 g/dL 6.4(L) 7.3 7.0  Total Bilirubin 0.3 - 1.2 mg/dL 0.3 0.2(L) 0.3  Alkaline Phos 38 - 126 U/L 92 71 76  AST 15 - 41 U/L 14(L) 12(L) 14(L)  ALT 0 - 44 U/L $Remo'18 17 18    'rCFzi$ Lab Results  Component Value Date   WBC 11.6 (H) 04/08/2020   HGB  11.6 (L) 04/08/2020   HCT 34.9 (L) 04/08/2020   MCV 91.8 04/08/2020   PLT 433 (H) 04/08/2020   NEUTROABS 9.5 (H) 04/08/2020    ASSESSMENT & PLAN:  Malignant neoplasm of upper-outer quadrant of right breast in female, estrogen receptor positive ([place]) 03/04/2020:Palpable right breast mass x6 months. Mammogram revealed left breast cysts (intraductal papilloma), 11 cm right breast mass and 2 masses 2.6 cm each in the right axilla: Biopsy grade 3 IDC ER 5% weak, PR 0%, Ki-67 25%, HER-2 3+ IHC positive  Treatment Plan: 1. Neoadjuvant chemotherapy with TCH Perjeta 6 cycles followed by Herceptin Perjeta maintenance versus Kadcyla maintenance (based on response to neoadjuvant chemo) for 1 year 2. Followed bymastectomywith sentinel lymph node study 3. Followed by adjuvant radiation therapy 4.Followed by antiestrogen therapy 5.Consideration for neratinib ----------------------------------------------------------------------------------------------------------------- 03/15/20: Bone Scan: Subtle uptake in ribs: Non diagnostic 03/16/20: CT CAP: Large Rt breast mass with skin thickening, small sat lesion 1.8 cm, multiple enlarged LN, 4 mm LLL nodule ---------------------------------------------------------------------------------------------------------------- Current Treatment: Cycle 2 TCHP ECHO 03/16/20: EF 65-70% Labs reviewed.  Chemo toxicities: 1.  Nausea: Improved with Compazine 2. diarrhea: No further diarrhea at this time 3.  Fatigue: Fatigue has resolved 4.    Alopecia 5.  Hyperglycemia: I discontinued oral dexamethasone.  Instructed her to watch her carbs.   RTC in 3 weeks for cycle 3    No orders of the defined types were placed in this encounter.  The patient has a good understanding of the overall plan. she agrees with it. she will call with any problems that may develop before the next visit here.  Total time spent: 30 mins including face to face time and time spent for  planning, charting and coordination of care  Rulon Eisenmenger, MD, MPH 04/08/2020  I, Cloyde Reams Dorshimer, am acting as scribe for Dr. [name].  I have reviewed the above documentation for accuracy and completeness, and I agree with the above.

## 2020-04-08 ENCOUNTER — Other Ambulatory Visit: Payer: Self-pay

## 2020-04-08 ENCOUNTER — Inpatient Hospital Stay: Payer: Managed Care, Other (non HMO) | Attending: Hematology and Oncology | Admitting: Hematology and Oncology

## 2020-04-08 ENCOUNTER — Inpatient Hospital Stay: Payer: Managed Care, Other (non HMO)

## 2020-04-08 DIAGNOSIS — C50411 Malignant neoplasm of upper-outer quadrant of right female breast: Secondary | ICD-10-CM | POA: Insufficient documentation

## 2020-04-08 DIAGNOSIS — Z5111 Encounter for antineoplastic chemotherapy: Secondary | ICD-10-CM | POA: Insufficient documentation

## 2020-04-08 DIAGNOSIS — Z170 Estrogen receptor positive status [ER+]: Secondary | ICD-10-CM

## 2020-04-08 DIAGNOSIS — Z5112 Encounter for antineoplastic immunotherapy: Secondary | ICD-10-CM | POA: Insufficient documentation

## 2020-04-08 DIAGNOSIS — Z95828 Presence of other vascular implants and grafts: Secondary | ICD-10-CM

## 2020-04-08 LAB — CBC WITH DIFFERENTIAL (CANCER CENTER ONLY)
Abs Immature Granulocytes: 0.06 10*3/uL (ref 0.00–0.07)
Basophils Absolute: 0.1 10*3/uL (ref 0.0–0.1)
Basophils Relative: 0 %
Eosinophils Absolute: 0 10*3/uL (ref 0.0–0.5)
Eosinophils Relative: 0 %
HCT: 34.9 % — ABNORMAL LOW (ref 36.0–46.0)
Hemoglobin: 11.6 g/dL — ABNORMAL LOW (ref 12.0–15.0)
Immature Granulocytes: 1 %
Lymphocytes Relative: 12 %
Lymphs Abs: 1.3 10*3/uL (ref 0.7–4.0)
MCH: 30.5 pg (ref 26.0–34.0)
MCHC: 33.2 g/dL (ref 30.0–36.0)
MCV: 91.8 fL (ref 80.0–100.0)
Monocytes Absolute: 0.7 10*3/uL (ref 0.1–1.0)
Monocytes Relative: 6 %
Neutro Abs: 9.5 10*3/uL — ABNORMAL HIGH (ref 1.7–7.7)
Neutrophils Relative %: 81 %
Platelet Count: 433 10*3/uL — ABNORMAL HIGH (ref 150–400)
RBC: 3.8 MIL/uL — ABNORMAL LOW (ref 3.87–5.11)
RDW: 13 % (ref 11.5–15.5)
WBC Count: 11.6 10*3/uL — ABNORMAL HIGH (ref 4.0–10.5)
nRBC: 0 % (ref 0.0–0.2)

## 2020-04-08 LAB — CMP (CANCER CENTER ONLY)
ALT: 18 U/L (ref 0–44)
AST: 10 U/L — ABNORMAL LOW (ref 15–41)
Albumin: 3.6 g/dL (ref 3.5–5.0)
Alkaline Phosphatase: 85 U/L (ref 38–126)
Anion gap: 14 (ref 5–15)
BUN: 9 mg/dL (ref 8–23)
CO2: 21 mmol/L — ABNORMAL LOW (ref 22–32)
Calcium: 9 mg/dL (ref 8.9–10.3)
Chloride: 103 mmol/L (ref 98–111)
Creatinine: 0.79 mg/dL (ref 0.44–1.00)
GFR, Estimated: >60 mL/min (ref >60)
Glucose, Bld: 319 mg/dL — ABNORMAL HIGH (ref 70–99)
Potassium: 4 mmol/L (ref 3.5–5.1)
Sodium: 138 mmol/L (ref 135–145)
Total Bilirubin: 0.3 mg/dL (ref 0.3–1.2)
Total Protein: 6.7 g/dL (ref 6.5–8.1)

## 2020-04-08 MED ORDER — HEPARIN SOD (PORK) LOCK FLUSH 100 UNIT/ML IV SOLN
500.0000 [IU] | Freq: Once | INTRAVENOUS | Status: AC | PRN
  Administered 2020-04-08: 500 [IU]
  Filled 2020-04-08: qty 5

## 2020-04-08 MED ORDER — ACETAMINOPHEN 325 MG PO TABS
ORAL_TABLET | ORAL | Status: AC
  Filled 2020-04-08: qty 2

## 2020-04-08 MED ORDER — SODIUM CHLORIDE 0.9 % IV SOLN
10.0000 mg | Freq: Once | INTRAVENOUS | Status: AC
  Administered 2020-04-08: 10 mg via INTRAVENOUS
  Filled 2020-04-08: qty 10

## 2020-04-08 MED ORDER — FOSAPREPITANT DIMEGLUMINE INJECTION 150 MG
150.0000 mg | Freq: Once | INTRAVENOUS | Status: AC
  Administered 2020-04-08: 150 mg via INTRAVENOUS
  Filled 2020-04-08: qty 150

## 2020-04-08 MED ORDER — DIPHENHYDRAMINE HCL 25 MG PO CAPS
ORAL_CAPSULE | ORAL | Status: AC
  Filled 2020-04-08: qty 2

## 2020-04-08 MED ORDER — PALONOSETRON HCL INJECTION 0.25 MG/5ML
INTRAVENOUS | Status: AC
  Filled 2020-04-08: qty 5

## 2020-04-08 MED ORDER — SODIUM CHLORIDE 0.9% FLUSH
10.0000 mL | Freq: Once | INTRAVENOUS | Status: AC
Start: 2020-04-08 — End: 2020-04-08
  Administered 2020-04-08: 10 mL
  Filled 2020-04-08: qty 10

## 2020-04-08 MED ORDER — PALONOSETRON HCL INJECTION 0.25 MG/5ML
0.2500 mg | Freq: Once | INTRAVENOUS | Status: AC
  Administered 2020-04-08: 0.25 mg via INTRAVENOUS

## 2020-04-08 MED ORDER — SODIUM CHLORIDE 0.9 % IV SOLN
Freq: Once | INTRAVENOUS | Status: AC
Start: 2020-04-08 — End: 2020-04-08
  Filled 2020-04-08: qty 250

## 2020-04-08 MED ORDER — ACETAMINOPHEN 325 MG PO TABS
650.0000 mg | ORAL_TABLET | Freq: Once | ORAL | Status: AC
Start: 2020-04-08 — End: 2020-04-08
  Administered 2020-04-08: 650 mg via ORAL

## 2020-04-08 MED ORDER — SODIUM CHLORIDE 0.9 % IV SOLN
420.0000 mg | Freq: Once | INTRAVENOUS | Status: AC
  Administered 2020-04-08: 420 mg via INTRAVENOUS
  Filled 2020-04-08: qty 14

## 2020-04-08 MED ORDER — SODIUM CHLORIDE 0.9 % IV SOLN
700.0000 mg | Freq: Once | INTRAVENOUS | Status: AC
  Administered 2020-04-08: 700 mg via INTRAVENOUS
  Filled 2020-04-08: qty 70

## 2020-04-08 MED ORDER — TRASTUZUMAB-ANNS CHEMO 150 MG IV SOLR
6.0000 mg/kg | Freq: Once | INTRAVENOUS | Status: AC
Start: 2020-04-08 — End: 2020-04-08
  Administered 2020-04-08: 567 mg via INTRAVENOUS
  Filled 2020-04-08: qty 27

## 2020-04-08 MED ORDER — SODIUM CHLORIDE 0.9 % IV SOLN
75.0000 mg/m2 | Freq: Once | INTRAVENOUS | Status: AC
  Administered 2020-04-08: 160 mg via INTRAVENOUS
  Filled 2020-04-08: qty 16

## 2020-04-08 MED ORDER — SODIUM CHLORIDE 0.9% FLUSH
10.0000 mL | INTRAVENOUS | Status: DC | PRN
  Administered 2020-04-08: 10 mL
  Filled 2020-04-08: qty 10

## 2020-04-08 MED ORDER — DIPHENHYDRAMINE HCL 25 MG PO CAPS
50.0000 mg | ORAL_CAPSULE | Freq: Once | ORAL | Status: AC
  Administered 2020-04-08: 50 mg via ORAL

## 2020-04-08 NOTE — Patient Instructions (Signed)
[facility] Discharge Instructions for Patients Receiving Chemotherapy  Today you received the following chemotherapy agents: Kanjinti, Perjeta, Taxotere, Carboplatin  To help prevent nausea and vomiting after your treatment, we encourage you to take your nausea medication as directed.    If you develop nausea and vomiting that is not controlled by your nausea medication, call the clinic.   BELOW ARE SYMPTOMS THAT SHOULD BE REPORTED IMMEDIATELY:  *FEVER GREATER THAN 100.5 F  *CHILLS WITH OR WITHOUT FEVER  NAUSEA AND VOMITING THAT IS NOT CONTROLLED WITH YOUR NAUSEA MEDICATION  *UNUSUAL SHORTNESS OF BREATH  *UNUSUAL BRUISING OR BLEEDING  TENDERNESS IN MOUTH AND THROAT WITH OR WITHOUT PRESENCE OF ULCERS  *URINARY PROBLEMS  *BOWEL PROBLEMS  UNUSUAL RASH Items with * indicate a potential emergency and should be followed up as soon as possible.  Feel free to call the clinic should you have any questions or concerns. The clinic phone number is [phone].  Please show the [facility] at check-in to the Emergency Department and triage nurse.

## 2020-04-08 NOTE — Assessment & Plan Note (Signed)
03/04/2020:Palpable right breast mass x6 months. Mammogram revealed left breast cysts (intraductal papilloma), 11 cm right breast mass and 2 masses 2.6 cm each in the right axilla: Biopsy grade 3 IDC ER 5% weak, PR 0%, Ki-67 25%, HER-2 3+ IHC positive  Treatment Plan: 1. Neoadjuvant chemotherapy with TCH Perjeta 6 cycles followed by Herceptin Perjeta maintenance versus Kadcyla maintenance (based on response to neoadjuvant chemo) for 1 year 2. Followed bymastectomywith sentinel lymph node study 3. Followed by adjuvant radiation therapy 4.Followed by antiestrogen therapy 5.Consideration for neratinib ----------------------------------------------------------------------------------------------------------------- 03/15/20: Bone Scan: Subtle uptake in ribs: Non diagnostic 03/16/20: CT CAP: Large Rt breast mass with skin thickening, small sat lesion 1.8 cm, multiple enlarged LN, 4 mm LLL nodule ---------------------------------------------------------------------------------------------------------------- Current Treatment: Cycle 2 TCHP ECHO 03/16/20: EF 65-70% Labs reviewed.  Chemo toxicities: 1.  Nausea: Improved with Compazine 2. diarrhea: Mild instructed to take Imodium 3.  Fatigue 4.  Sores on the bottom: Instructed to use diaper rash cream   RTC in 3 weeks for cycle 3

## 2020-04-28 NOTE — Progress Notes (Signed)
Patient Care Team: [name], MD as PCP - General (Hematology and Oncology) Pershing Proud, RN as Oncology Nurse Navigator Donnelly Angelica, RN as Oncology Nurse Navigator  DIAGNOSIS:    ICD-10-CM   1. Malignant neoplasm of upper-outer quadrant of right breast in female, estrogen receptor positive ([facility])  C50.411    Z17.0     SUMMARY OF ONCOLOGIC HISTORY: Oncology History  Malignant neoplasm of upper-outer quadrant of right breast in female, estrogen receptor positive ([facility])  03/10/2020 Initial Diagnosis   Palpable right breast mass x6 months.  Mammogram revealed left breast cysts (intraductal papilloma), 11 cm right breast mass and 2 masses 2.6 cm each in the right axilla: Biopsy grade 3 IDC ER 5% weak, PR 0%, Ki-67 25%, HER-2 3+ IHC positive   03/10/2020 Cancer Staging   Staging form: Breast, AJCC 8th Edition - Clinical stage from 03/10/2020: Stage IIIA (cT3, cN1, cM0, G3, ER+, PR-, HER2+) - Signed by [name], MD on 03/10/2020 Stage prefix: Initial diagnosis   03/19/2020 -  Chemotherapy    Patient is on Treatment Plan: BREAST  DOCETAXEL + CARBOPLATIN + TRASTUZUMAB + PERTUZUMAB  (TCHP) Q21D         CHIEF COMPLIANT: Cycle 3Day 1TCHP  INTERVAL HISTORY: Sabrina Woods is a 65 y.o. with above-mentioned history of right breast cancer currently on neoadjuvant chemotherapy with TCH Perjeta. She presents to the clinic todayfor treatment. Her fatigue lasted for 10 days after the last cycle of chemo.  She had very mild nausea.  Diarrhea was 4 3 to 4 days but each time it only lasted 1 or 2 episodes.  She took Imodium which controlled it.  Her taste and appetite get worse after treatment but then the recovered and she is able to eat better.  Denies any mouth sores at this time.  ALLERGIES:  has No Known Allergies.  MEDICATIONS:  Current Outpatient Medications  Medication Sig Dispense Refill  . ALPRAZolam (XANAX) 0.25 MG tablet Take 1 tablet (0.25 mg total) by mouth 2 (two)  times daily as needed for anxiety. 60 tablet 3  . esomeprazole (NEXIUM) 20 MG capsule Take by mouth daily.    Marland Kitchen lidocaine-prilocaine (EMLA) cream Apply to affected area once 30 g 3  . Multiple Vitamin (MULTIVITAMIN) capsule Take 1 capsule by mouth daily.    . nicotine (NICODERM CQ) 14 mg/24hr patch Place 1 patch (14 mg total) onto the skin daily. 14 patch 0  . nicotine (NICODERM CQ) 7 mg/24hr patch Place 1 patch (7 mg total) onto the skin daily. 28 patch 0  . ondansetron (ZOFRAN) 8 MG tablet Take 1 tablet (8 mg total) by mouth 2 (two) times daily as needed (Nausea or vomiting). Start on the third day after chemotherapy. 30 tablet 1  . prochlorperazine (COMPAZINE) 10 MG tablet Take 1 tablet (10 mg total) by mouth every 6 (six) hours as needed (Nausea or vomiting). 30 tablet 1   Current Facility-Administered Medications  Medication Dose Route Frequency Provider Last Rate Last Admin  . levonorgestrel (MIRENA) 20 MCG/24HR IUD   Intrauterine Once Trellis Paganini, MD        PHYSICAL EXAMINATION: ECOG PERFORMANCE STATUS: 1 - Symptomatic but completely ambulatory  Vitals:   04/29/20 0941  BP: (!) 149/69  Pulse: 94  Resp: 15  Temp: 97.7 F (36.5 C)  SpO2: 99%   Filed Weights   04/29/20 0941  Weight: 203 lb 4.8 oz (92.2 kg)    LABORATORY DATA:  I have reviewed  the data as listed CMP Latest Ref Rng & Units 04/29/2020 04/08/2020 03/25/2020  Glucose 70 - 99 mg/dL 243(H) 319(H) 227(H)  BUN 8 - 23 mg/dL $Remove'8 9 9  'kobPTVE$ Creatinine 0.44 - 1.00 mg/dL 0.73 0.79 0.75  Sodium 135 - 145 mmol/L 138 138 137  Potassium 3.5 - 5.1 mmol/L 3.9 4.0 3.6  Chloride 98 - 111 mmol/L 104 103 102  CO2 22 - 32 mmol/L 24 21(L) 24  Calcium 8.9 - 10.3 mg/dL 9.3 9.0 9.1  Total Protein 6.5 - 8.1 g/dL 6.7 6.7 6.4(L)  Total Bilirubin 0.3 - 1.2 mg/dL 0.3 0.3 0.3  Alkaline Phos 38 - 126 U/L 90 85 92  AST 15 - 41 U/L 16 10(L) 14(L)  ALT 0 - 44 U/L $Remo'15 18 18    'QLpOY$ Lab Results  Component Value Date   WBC 7.4 04/29/2020   HGB  11.4 (L) 04/29/2020   HCT 33.9 (L) 04/29/2020   MCV 92.6 04/29/2020   PLT 285 04/29/2020   NEUTROABS 5.3 04/29/2020    ASSESSMENT & PLAN:  Malignant neoplasm of upper-outer quadrant of right breast in female, estrogen receptor positive ([place]) 03/04/2020:Palpable right breast mass x6 months. Mammogram revealed left breast cysts (intraductal papilloma), 11 cm right breast mass and 2 masses 2.6 cm each in the right axilla: Biopsy grade 3 IDC ER 5% weak, PR 0%, Ki-67 25%, HER-2 3+ IHC positive  Treatment Plan: 1. Neoadjuvant chemotherapy with TCH Perjeta 6 cycles followed by Herceptin Perjeta maintenance versus Kadcyla maintenance (based on response to neoadjuvant chemo) for 1 year 2. Followed bymastectomywith sentinel lymph node study 3. Followed by adjuvant radiation therapy 4.Followed by antiestrogen therapy 5.Consideration for neratinib ----------------------------------------------------------------------------------------------------------------- 03/15/20: Bone Scan: Subtle uptake in ribs: Non diagnostic 03/16/20: CT CAP: Large Rt breast mass with skin thickening, small sat lesion 1.8 cm, multiple enlarged LN, 4 mm LLL nodule ---------------------------------------------------------------------------------------------------------------- Current Treatment: Cycle 3TCHP ECHO 03/16/20: EF 65-70% Labs reviewed.  Chemo toxicities: 1.Nausea: Improved with Compazine 2.diarrhea:  Few episodes of diarrhea well controlled with Imodium 3.Fatigue: Fatigue has lasted for 10 days with the last cycle 4.   Alopecia 5.  Hyperglycemia: I discontinued oral dexamethasone.  Instructed her to watch her carbs. Monitoring her blood counts very closely. In spite of mild diarrhea her electrolytes and kidney function are excellent today. RTC in 3 weeks for cycle 4    No orders of the defined types were placed in this encounter.  The patient has a good understanding of the overall plan. she  agrees with it. she will call with any problems that may develop before the next visit here.  Total time spent: 30 mins including face to face time and time spent for planning, charting and coordination of care  Rulon Eisenmenger, MD, MPH 04/29/2020  I, Molly Dorshimer, am acting as scribe for Dr. [name].  I have reviewed the above documentation for accuracy and completeness, and I agree with the above.

## 2020-04-28 NOTE — Assessment & Plan Note (Signed)
03/04/2020:Palpable right breast mass x6 months. Mammogram revealed left breast cysts (intraductal papilloma), 11 cm right breast mass and 2 masses 2.6 cm each in the right axilla: Biopsy grade 3 IDC ER 5% weak, PR 0%, Ki-67 25%, HER-2 3+ IHC positive  Treatment Plan: 1. Neoadjuvant chemotherapy with TCH Perjeta 6 cycles followed by Herceptin Perjeta maintenance versus Kadcyla maintenance (based on response to neoadjuvant chemo) for 1 year 2. Followed bymastectomywith sentinel lymph node study 3. Followed by adjuvant radiation therapy 4.Followed by antiestrogen therapy 5.Consideration for neratinib ----------------------------------------------------------------------------------------------------------------- 03/15/20: Bone Scan: Subtle uptake in ribs: Non diagnostic 03/16/20: CT CAP: Large Rt breast mass with skin thickening, small sat lesion 1.8 cm, multiple enlarged LN, 4 mm LLL nodule ---------------------------------------------------------------------------------------------------------------- Current Treatment: Cycle 3TCHP ECHO 03/16/20: EF 65-70% Labs reviewed.  Chemo toxicities: 1.Nausea: Improved with Compazine 2.diarrhea: No further diarrhea at this time 3.Fatigue: Fatigue has resolved 4.   Alopecia 5.  Hyperglycemia: I discontinued oral dexamethasone.  Instructed her to watch her carbs.   RTC in 3 weeks for cycle 4

## 2020-04-29 ENCOUNTER — Other Ambulatory Visit: Payer: Self-pay

## 2020-04-29 ENCOUNTER — Inpatient Hospital Stay: Payer: Managed Care, Other (non HMO)

## 2020-04-29 ENCOUNTER — Other Ambulatory Visit: Payer: Managed Care, Other (non HMO)

## 2020-04-29 ENCOUNTER — Inpatient Hospital Stay (HOSPITAL_BASED_OUTPATIENT_CLINIC_OR_DEPARTMENT_OTHER): Payer: Managed Care, Other (non HMO) | Admitting: Hematology and Oncology

## 2020-04-29 DIAGNOSIS — Z170 Estrogen receptor positive status [ER+]: Secondary | ICD-10-CM | POA: Diagnosis not present

## 2020-04-29 DIAGNOSIS — Z5112 Encounter for antineoplastic immunotherapy: Secondary | ICD-10-CM | POA: Diagnosis not present

## 2020-04-29 DIAGNOSIS — C50411 Malignant neoplasm of upper-outer quadrant of right female breast: Secondary | ICD-10-CM

## 2020-04-29 DIAGNOSIS — Z95828 Presence of other vascular implants and grafts: Secondary | ICD-10-CM

## 2020-04-29 LAB — CBC WITH DIFFERENTIAL (CANCER CENTER ONLY)
Abs Immature Granulocytes: 0.01 10*3/uL (ref 0.00–0.07)
Basophils Absolute: 0.1 10*3/uL (ref 0.0–0.1)
Basophils Relative: 1 %
Eosinophils Absolute: 0 10*3/uL (ref 0.0–0.5)
Eosinophils Relative: 0 %
HCT: 33.9 % — ABNORMAL LOW (ref 36.0–46.0)
Hemoglobin: 11.4 g/dL — ABNORMAL LOW (ref 12.0–15.0)
Immature Granulocytes: 0 %
Lymphocytes Relative: 18 %
Lymphs Abs: 1.4 10*3/uL (ref 0.7–4.0)
MCH: 31.1 pg (ref 26.0–34.0)
MCHC: 33.6 g/dL (ref 30.0–36.0)
MCV: 92.6 fL (ref 80.0–100.0)
Monocytes Absolute: 0.7 10*3/uL (ref 0.1–1.0)
Monocytes Relative: 9 %
Neutro Abs: 5.3 10*3/uL (ref 1.7–7.7)
Neutrophils Relative %: 72 %
Platelet Count: 285 10*3/uL (ref 150–400)
RBC: 3.66 MIL/uL — ABNORMAL LOW (ref 3.87–5.11)
RDW: 14.6 % (ref 11.5–15.5)
WBC Count: 7.4 10*3/uL (ref 4.0–10.5)
nRBC: 0 % (ref 0.0–0.2)

## 2020-04-29 LAB — CMP (CANCER CENTER ONLY)
ALT: 15 U/L (ref 0–44)
AST: 16 U/L (ref 15–41)
Albumin: 3.6 g/dL (ref 3.5–5.0)
Alkaline Phosphatase: 90 U/L (ref 38–126)
Anion gap: 10 (ref 5–15)
BUN: 8 mg/dL (ref 8–23)
CO2: 24 mmol/L (ref 22–32)
Calcium: 9.3 mg/dL (ref 8.9–10.3)
Chloride: 104 mmol/L (ref 98–111)
Creatinine: 0.73 mg/dL (ref 0.44–1.00)
GFR, Estimated: >60 mL/min (ref >60)
Glucose, Bld: 243 mg/dL — ABNORMAL HIGH (ref 70–99)
Potassium: 3.9 mmol/L (ref 3.5–5.1)
Sodium: 138 mmol/L (ref 135–145)
Total Bilirubin: 0.3 mg/dL (ref 0.3–1.2)
Total Protein: 6.7 g/dL (ref 6.5–8.1)

## 2020-04-29 MED ORDER — SODIUM CHLORIDE 0.9 % IV SOLN
10.0000 mg | Freq: Once | INTRAVENOUS | Status: AC
  Administered 2020-04-29: 10 mg via INTRAVENOUS
  Filled 2020-04-29: qty 10

## 2020-04-29 MED ORDER — SODIUM CHLORIDE 0.9 % IV SOLN
420.0000 mg | Freq: Once | INTRAVENOUS | Status: AC
  Administered 2020-04-29: 420 mg via INTRAVENOUS
  Filled 2020-04-29: qty 14

## 2020-04-29 MED ORDER — SODIUM CHLORIDE 0.9 % IV SOLN
700.0000 mg | Freq: Once | INTRAVENOUS | Status: AC
  Administered 2020-04-29: 700 mg via INTRAVENOUS
  Filled 2020-04-29: qty 70

## 2020-04-29 MED ORDER — ACETAMINOPHEN 325 MG PO TABS
650.0000 mg | ORAL_TABLET | Freq: Once | ORAL | Status: AC
  Administered 2020-04-29: 650 mg via ORAL

## 2020-04-29 MED ORDER — PALONOSETRON HCL INJECTION 0.25 MG/5ML
0.2500 mg | Freq: Once | INTRAVENOUS | Status: AC
  Administered 2020-04-29: 0.25 mg via INTRAVENOUS

## 2020-04-29 MED ORDER — ACETAMINOPHEN 325 MG PO TABS
ORAL_TABLET | ORAL | Status: AC
  Filled 2020-04-29: qty 2

## 2020-04-29 MED ORDER — SODIUM CHLORIDE 0.9 % IV SOLN
75.0000 mg/m2 | Freq: Once | INTRAVENOUS | Status: AC
Start: 2020-04-29 — End: 2020-04-29
  Administered 2020-04-29: 160 mg via INTRAVENOUS
  Filled 2020-04-29: qty 16

## 2020-04-29 MED ORDER — DIPHENHYDRAMINE HCL 25 MG PO CAPS
50.0000 mg | ORAL_CAPSULE | Freq: Once | ORAL | Status: AC
  Administered 2020-04-29: 50 mg via ORAL

## 2020-04-29 MED ORDER — PALONOSETRON HCL INJECTION 0.25 MG/5ML
INTRAVENOUS | Status: AC
  Filled 2020-04-29: qty 5

## 2020-04-29 MED ORDER — HEPARIN SOD (PORK) LOCK FLUSH 100 UNIT/ML IV SOLN
500.0000 [IU] | Freq: Once | INTRAVENOUS | Status: AC | PRN
  Administered 2020-04-29: 500 [IU]
  Filled 2020-04-29: qty 5

## 2020-04-29 MED ORDER — SODIUM CHLORIDE 0.9% FLUSH
10.0000 mL | Freq: Once | INTRAVENOUS | Status: AC
  Administered 2020-04-29: 10 mL
  Filled 2020-04-29: qty 10

## 2020-04-29 MED ORDER — SODIUM CHLORIDE 0.9% FLUSH
10.0000 mL | INTRAVENOUS | Status: DC | PRN
  Administered 2020-04-29: 10 mL
  Filled 2020-04-29: qty 10

## 2020-04-29 MED ORDER — SODIUM CHLORIDE 0.9 % IV SOLN
Freq: Once | INTRAVENOUS | Status: AC
  Filled 2020-04-29: qty 250

## 2020-04-29 MED ORDER — DIPHENHYDRAMINE HCL 25 MG PO CAPS
ORAL_CAPSULE | ORAL | Status: AC
  Filled 2020-04-29: qty 2

## 2020-04-29 MED ORDER — FOSAPREPITANT DIMEGLUMINE INJECTION 150 MG
150.0000 mg | Freq: Once | INTRAVENOUS | Status: AC
  Administered 2020-04-29: 150 mg via INTRAVENOUS
  Filled 2020-04-29: qty 150

## 2020-04-29 MED ORDER — SODIUM CHLORIDE 0.9 % IV SOLN
6.0000 mg/kg | Freq: Once | INTRAVENOUS | Status: AC
Start: 2020-04-29 — End: 2020-04-29
  Administered 2020-04-29: 567 mg via INTRAVENOUS
  Filled 2020-04-29: qty 27

## 2020-04-29 NOTE — Patient Instructions (Signed)
[facility]  Discharge Instructions: Thank you for choosing [facility] to provide your oncology and hematology care.   If you have a lab appointment with the [facility], please go directly to the [facility] and check in at the registration area.   Wear comfortable clothing and clothing appropriate for easy access to any Portacath or PICC line.   We strive to give you quality time with your provider. You may need to reschedule your appointment if you arrive late (15 or more minutes).  Arriving late affects you and other patients whose appointments are after yours.  Also, if you miss three or more appointments without notifying the office, you may be dismissed from the clinic at the provider's discretion.      For prescription refill requests, have your pharmacy contact our office and allow 72 hours for refills to be completed.    Today you received the following chemotherapy and/or immunotherapy agents Trastuzumab/Pertuzumab, Docetaxel and Carboplatin      To help prevent nausea and vomiting after your treatment, we encourage you to take your nausea medication as directed.  BELOW ARE SYMPTOMS THAT SHOULD BE REPORTED IMMEDIATELY: . *FEVER GREATER THAN 100.4 F (38 C) OR HIGHER . *CHILLS OR SWEATING . *NAUSEA AND VOMITING THAT IS NOT CONTROLLED WITH YOUR NAUSEA MEDICATION . *UNUSUAL SHORTNESS OF BREATH . *UNUSUAL BRUISING OR BLEEDING . *URINARY PROBLEMS (pain or burning when urinating, or frequent urination) . *BOWEL PROBLEMS (unusual diarrhea, constipation, pain near the anus) . TENDERNESS IN MOUTH AND THROAT WITH OR WITHOUT PRESENCE OF ULCERS (sore throat, sores in mouth, or a toothache) . UNUSUAL RASH, SWELLING OR PAIN  . UNUSUAL VAGINAL DISCHARGE OR ITCHING   Items with * indicate a potential emergency and should be followed up as soon as possible or go to the Emergency Department if any problems should occur.  Please show the  CHEMOTHERAPY ALERT CARD or IMMUNOTHERAPY ALERT CARD at check-in to the Emergency Department and triage nurse.  Should you have questions after your visit or need to cancel or reschedule your appointment, please contact [facility]  Dept: [phone]  and follow the prompts.  Office hours are 8:00 a.m. to 4:30 p.m. Monday - Friday. Please note that voicemails left after 4:00 p.m. may not be returned until the following business day.  We are closed weekends and major holidays. You have access to a nurse at all times for urgent questions. Please call the main number to the clinic Dept: [phone] and follow the prompts.   For any non-urgent questions, you may also contact your provider using MyChart. We now offer e-Visits for anyone 68 and older to request care online for non-urgent symptoms. For details visit [email or web address].   Also download the MyChart app! Go to the app store, search "MyChart", open the app, select [facility], and log in with your MyChart username and password.  Due to Covid, a mask is required upon entering the hospital/clinic. If you do not have a mask, one will be given to you upon arrival. For doctor visits, patients may have 1 support person aged 32 or older with them. For treatment visits, patients cannot have anyone with them due to current Covid guidelines and our immunocompromised population.

## 2020-05-20 ENCOUNTER — Inpatient Hospital Stay: Payer: Managed Care, Other (non HMO)

## 2020-05-20 ENCOUNTER — Other Ambulatory Visit: Payer: Managed Care, Other (non HMO)

## 2020-05-20 ENCOUNTER — Other Ambulatory Visit: Payer: Self-pay

## 2020-05-20 ENCOUNTER — Encounter: Payer: Self-pay | Admitting: *Deleted

## 2020-05-20 ENCOUNTER — Inpatient Hospital Stay: Payer: Managed Care, Other (non HMO) | Attending: Hematology and Oncology | Admitting: Hematology and Oncology

## 2020-05-20 VITALS — BP 149/74 | HR 86 | Temp 97.9°F | Resp 18

## 2020-05-20 DIAGNOSIS — C50411 Malignant neoplasm of upper-outer quadrant of right female breast: Secondary | ICD-10-CM

## 2020-05-20 DIAGNOSIS — Z5112 Encounter for antineoplastic immunotherapy: Secondary | ICD-10-CM | POA: Insufficient documentation

## 2020-05-20 DIAGNOSIS — Z170 Estrogen receptor positive status [ER+]: Secondary | ICD-10-CM | POA: Insufficient documentation

## 2020-05-20 DIAGNOSIS — Z95828 Presence of other vascular implants and grafts: Secondary | ICD-10-CM

## 2020-05-20 DIAGNOSIS — D6481 Anemia due to antineoplastic chemotherapy: Secondary | ICD-10-CM | POA: Diagnosis not present

## 2020-05-20 DIAGNOSIS — T451X5A Adverse effect of antineoplastic and immunosuppressive drugs, initial encounter: Secondary | ICD-10-CM

## 2020-05-20 DIAGNOSIS — G620 Drug-induced polyneuropathy: Secondary | ICD-10-CM | POA: Insufficient documentation

## 2020-05-20 DIAGNOSIS — Z5111 Encounter for antineoplastic chemotherapy: Secondary | ICD-10-CM | POA: Insufficient documentation

## 2020-05-20 LAB — CMP (CANCER CENTER ONLY)
ALT: 17 U/L (ref 0–44)
AST: 16 U/L (ref 15–41)
Albumin: 3.4 g/dL — ABNORMAL LOW (ref 3.5–5.0)
Alkaline Phosphatase: 84 U/L (ref 38–126)
Anion gap: 9 (ref 5–15)
BUN: 8 mg/dL (ref 8–23)
CO2: 25 mmol/L (ref 22–32)
Calcium: 9.3 mg/dL (ref 8.9–10.3)
Chloride: 107 mmol/L (ref 98–111)
Creatinine: 0.73 mg/dL (ref 0.44–1.00)
GFR, Estimated: >60 mL/min (ref >60)
Glucose, Bld: 210 mg/dL — ABNORMAL HIGH (ref 70–99)
Potassium: 3.7 mmol/L (ref 3.5–5.1)
Sodium: 141 mmol/L (ref 135–145)
Total Bilirubin: 0.3 mg/dL (ref 0.3–1.2)
Total Protein: 6.4 g/dL — ABNORMAL LOW (ref 6.5–8.1)

## 2020-05-20 LAB — CBC WITH DIFFERENTIAL (CANCER CENTER ONLY)
Abs Immature Granulocytes: 0.01 10*3/uL (ref 0.00–0.07)
Basophils Absolute: 0.1 10*3/uL (ref 0.0–0.1)
Basophils Relative: 1 %
Eosinophils Absolute: 0 10*3/uL (ref 0.0–0.5)
Eosinophils Relative: 0 %
HCT: 33.1 % — ABNORMAL LOW (ref 36.0–46.0)
Hemoglobin: 10.8 g/dL — ABNORMAL LOW (ref 12.0–15.0)
Immature Granulocytes: 0 %
Lymphocytes Relative: 22 %
Lymphs Abs: 1.1 10*3/uL (ref 0.7–4.0)
MCH: 31.3 pg (ref 26.0–34.0)
MCHC: 32.6 g/dL (ref 30.0–36.0)
MCV: 95.9 fL (ref 80.0–100.0)
Monocytes Absolute: 0.7 10*3/uL (ref 0.1–1.0)
Monocytes Relative: 13 %
Neutro Abs: 3.2 10*3/uL (ref 1.7–7.7)
Neutrophils Relative %: 64 %
Platelet Count: 247 10*3/uL (ref 150–400)
RBC: 3.45 MIL/uL — ABNORMAL LOW (ref 3.87–5.11)
RDW: 16.2 % — ABNORMAL HIGH (ref 11.5–15.5)
WBC Count: 5.1 10*3/uL (ref 4.0–10.5)
nRBC: 0 % (ref 0.0–0.2)

## 2020-05-20 MED ORDER — SODIUM CHLORIDE 0.9% FLUSH
10.0000 mL | INTRAVENOUS | Status: DC | PRN
  Administered 2020-05-20: 10 mL
  Filled 2020-05-20: qty 10

## 2020-05-20 MED ORDER — SODIUM CHLORIDE 0.9 % IV SOLN
10.0000 mg | Freq: Once | INTRAVENOUS | Status: AC
  Administered 2020-05-20: 10 mg via INTRAVENOUS
  Filled 2020-05-20: qty 10

## 2020-05-20 MED ORDER — HEPARIN SOD (PORK) LOCK FLUSH 100 UNIT/ML IV SOLN
500.0000 [IU] | Freq: Once | INTRAVENOUS | Status: AC | PRN
Start: 2020-05-20 — End: 2020-05-20
  Administered 2020-05-20: 500 [IU]
  Filled 2020-05-20: qty 5

## 2020-05-20 MED ORDER — PERTUZUMAB CHEMO INJECTION 420 MG/14ML
420.0000 mg | Freq: Once | INTRAVENOUS | Status: AC
  Administered 2020-05-20: 420 mg via INTRAVENOUS
  Filled 2020-05-20: qty 14

## 2020-05-20 MED ORDER — PALONOSETRON HCL INJECTION 0.25 MG/5ML
INTRAVENOUS | Status: AC
  Filled 2020-05-20: qty 5

## 2020-05-20 MED ORDER — ACETAMINOPHEN 325 MG PO TABS
650.0000 mg | ORAL_TABLET | Freq: Once | ORAL | Status: AC
Start: 2020-05-20 — End: 2020-05-20
  Administered 2020-05-20: 650 mg via ORAL

## 2020-05-20 MED ORDER — SODIUM CHLORIDE 0.9 % IV SOLN
Freq: Once | INTRAVENOUS | Status: AC
  Filled 2020-05-20: qty 250

## 2020-05-20 MED ORDER — SODIUM CHLORIDE 0.9 % IV SOLN
150.0000 mg | Freq: Once | INTRAVENOUS | Status: AC
  Administered 2020-05-20: 150 mg via INTRAVENOUS
  Filled 2020-05-20: qty 150

## 2020-05-20 MED ORDER — PALONOSETRON HCL INJECTION 0.25 MG/5ML
0.2500 mg | Freq: Once | INTRAVENOUS | Status: AC
Start: 2020-05-20 — End: 2020-05-20
  Administered 2020-05-20: 0.25 mg via INTRAVENOUS

## 2020-05-20 MED ORDER — DIPHENHYDRAMINE HCL 25 MG PO CAPS
50.0000 mg | ORAL_CAPSULE | Freq: Once | ORAL | Status: AC
  Administered 2020-05-20: 50 mg via ORAL

## 2020-05-20 MED ORDER — SODIUM CHLORIDE 0.9 % IV SOLN
60.0000 mg/m2 | Freq: Once | INTRAVENOUS | Status: AC
  Administered 2020-05-20: 130 mg via INTRAVENOUS
  Filled 2020-05-20: qty 13

## 2020-05-20 MED ORDER — ACETAMINOPHEN 325 MG PO TABS
ORAL_TABLET | ORAL | Status: AC
  Filled 2020-05-20: qty 2

## 2020-05-20 MED ORDER — DIPHENHYDRAMINE HCL 25 MG PO CAPS
ORAL_CAPSULE | ORAL | Status: AC
  Filled 2020-05-20: qty 2

## 2020-05-20 MED ORDER — SODIUM CHLORIDE 0.9 % IV SOLN
600.0000 mg | Freq: Once | INTRAVENOUS | Status: AC
  Administered 2020-05-20: 600 mg via INTRAVENOUS
  Filled 2020-05-20: qty 60

## 2020-05-20 MED ORDER — SODIUM CHLORIDE 0.9% FLUSH
10.0000 mL | Freq: Once | INTRAVENOUS | Status: AC
  Administered 2020-05-20: 10 mL
  Filled 2020-05-20: qty 10

## 2020-05-20 MED ORDER — SODIUM CHLORIDE 0.9 % IV SOLN
6.0000 mg/kg | Freq: Once | INTRAVENOUS | Status: AC
Start: 2020-05-20 — End: 2020-05-20
  Administered 2020-05-20: 567 mg via INTRAVENOUS
  Filled 2020-05-20: qty 27

## 2020-05-20 MED ORDER — GABAPENTIN 300 MG PO CAPS: 300.0000 mg | ORAL_CAPSULE | Freq: Three times a day (TID) | ORAL | 1 refills | Status: DC

## 2020-05-20 NOTE — Progress Notes (Signed)
Patient Care Team: [name], MD as PCP - General (Hematology and Oncology) Mauro Kaufmann, RN as Oncology Nurse Navigator Rockwell Germany, RN as Oncology Nurse Navigator  DIAGNOSIS:  Encounter Diagnosis  Name Primary?  . Malignant neoplasm of upper-outer quadrant of right breast in female, estrogen receptor positive ([place])     SUMMARY OF ONCOLOGIC HISTORY: Oncology History  Malignant neoplasm of upper-outer quadrant of right breast in female, estrogen receptor positive ([place])  03/10/2020 Initial Diagnosis   Palpable right breast mass x6 months.  Mammogram revealed left breast cysts (intraductal papilloma), 11 cm right breast mass and 2 masses 2.6 cm each in the right axilla: Biopsy grade 3 IDC ER 5% weak, PR 0%, Ki-67 25%, HER-2 3+ IHC positive   03/10/2020 Cancer Staging   Staging form: Breast, AJCC 8th Edition - Clinical stage from 03/10/2020: Stage IIIA (cT3, cN1, cM0, G3, ER+, PR-, HER2+) - Signed by [name], MD on 03/10/2020 Stage prefix: Initial diagnosis   03/19/2020 -  Chemotherapy    Patient is on Treatment Plan: BREAST  DOCETAXEL + CARBOPLATIN + TRASTUZUMAB + PERTUZUMAB  (TCHP) Q21D         CHIEF COMPLIANT: Cycle 4 TCH Perjeta  INTERVAL HISTORY: Sabrina Woods is a 65 year old with above-mentioned history of right breast cancer who is currently neoadjuvant chemotherapy and today is cycle 4 of [place].  She has had a few problems with nausea or diarrhea and fatigue as a result of chemotherapy.  She has had intermittent diarrhea for which she takes Imodium on and off.  She is also gotten worsening neuropathy symptoms in the fingers and toes.  She is having difficulty sleeping at night especially after chemotherapy when she gets steroids.  We discontinued oral steroids and therefore her blood sugars have improved slightly.   ALLERGIES:  has No Known Allergies.  MEDICATIONS:  Current Outpatient Medications  Medication Sig Dispense Refill  . ALPRAZolam (XANAX)  0.25 MG tablet Take 1 tablet (0.25 mg total) by mouth 2 (two) times daily as needed for anxiety. 60 tablet 3  . esomeprazole (NEXIUM) 20 MG capsule Take by mouth daily.    Marland Kitchen lidocaine-prilocaine (EMLA) cream Apply to affected area once 30 g 3  . Multiple Vitamin (MULTIVITAMIN) capsule Take 1 capsule by mouth daily.    . nicotine (NICODERM CQ) 14 mg/24hr patch Place 1 patch (14 mg total) onto the skin daily. 14 patch 0  . nicotine (NICODERM CQ) 7 mg/24hr patch Place 1 patch (7 mg total) onto the skin daily. 28 patch 0  . ondansetron (ZOFRAN) 8 MG tablet Take 1 tablet (8 mg total) by mouth 2 (two) times daily as needed (Nausea or vomiting). Start on the third day after chemotherapy. 30 tablet 1  . prochlorperazine (COMPAZINE) 10 MG tablet Take 1 tablet (10 mg total) by mouth every 6 (six) hours as needed (Nausea or vomiting). 30 tablet 1   Current Facility-Administered Medications  Medication Dose Route Frequency Provider Last Rate Last Admin  . levonorgestrel (MIRENA) 20 MCG/24HR IUD   Intrauterine Once Bennetta Laos, MD        PHYSICAL EXAMINATION: ECOG PERFORMANCE STATUS: 1 - Symptomatic but completely ambulatory  Vitals:   05/20/20 0908  BP: (!) 152/62  Pulse: 100  Resp: 16  Temp: 97.7 F (36.5 C)  SpO2: 100%   Filed Weights   05/20/20 0908  Weight: 205 lb 3.2 oz (93.1 kg)    LABORATORY DATA:  I have reviewed the data as  listed CMP Latest Ref Rng & Units 04/29/2020 04/08/2020 03/25/2020  Glucose 70 - 99 mg/dL 243(H) 319(H) 227(H)  BUN 8 - 23 mg/dL $Remove'8 9 9  'vBMZCed$ Creatinine 0.44 - 1.00 mg/dL 0.73 0.79 0.75  Sodium 135 - 145 mmol/L 138 138 137  Potassium 3.5 - 5.1 mmol/L 3.9 4.0 3.6  Chloride 98 - 111 mmol/L 104 103 102  CO2 22 - 32 mmol/L 24 21(L) 24  Calcium 8.9 - 10.3 mg/dL 9.3 9.0 9.1  Total Protein 6.5 - 8.1 g/dL 6.7 6.7 6.4(L)  Total Bilirubin 0.3 - 1.2 mg/dL 0.3 0.3 0.3  Alkaline Phos 38 - 126 U/L 90 85 92  AST 15 - 41 U/L 16 10(L) 14(L)  ALT 0 - 44 U/L $Remo'15 18 18     'ItJEx$ Lab Results  Component Value Date   WBC 5.1 05/20/2020   HGB 10.8 (L) 05/20/2020   HCT 33.1 (L) 05/20/2020   MCV 95.9 05/20/2020   PLT 247 05/20/2020   NEUTROABS 3.2 05/20/2020    ASSESSMENT & PLAN:  Malignant neoplasm of upper-outer quadrant of right breast in female, estrogen receptor positive ([place]) 03/04/2020:Palpable right breast mass x6 months. Mammogram revealed left breast cysts (intraductal papilloma), 11 cm right breast mass and 2 masses 2.6 cm each in the right axilla: Biopsy grade 3 IDC ER 5% weak, PR 0%, Ki-67 25%, HER-2 3+ IHC positive  Treatment Plan: 1. Neoadjuvant chemotherapy with TCH Perjeta 6 cycles followed by Herceptin Perjeta maintenance versus Kadcyla maintenance (based on response to neoadjuvant chemo) for 1 year 2. Followed bymastectomywith sentinel lymph node study 3. Followed by adjuvant radiation therapy 4.Followed by antiestrogen therapy 5.Consideration for neratinib ----------------------------------------------------------------------------------------------------------------- 03/15/20: Bone Scan: Subtle uptake in ribs: Non diagnostic 03/16/20: CT CAP: Large Rt breast mass with skin thickening, small sat lesion 1.8 cm, multiple enlarged LN, 4 mm LLL nodule ---------------------------------------------------------------------------------------------------------------- Current Treatment: Cycle4TCHP ECHO 03/16/20: EF 65-70% Labs reviewed.  Chemo toxicities: 1.Nausea: Improved with Compazine 2.diarrhea: Few episodes of diarrhea well controlled with Imodium 3.Fatigue: Fatigue has lasted for 10 days with the last cycle 4.Alopecia 5.Hyperglycemia: Discontinued dexamethasone.   6.  Chemo induced peripheral neuropathy: I sent a prescription for gabapentin.  I decreased the dosage of Taxotere.  If the neuropathy continues to get worse then we may have to discontinue it. 7.  Chemo induced anemia: Hemoglobin is 10.8  Monitoring her blood  counts very closely.   RTC in3 weeks for cycle 5    No orders of the defined types were placed in this encounter.  The patient has a good understanding of the overall plan. she agrees with it. she will call with any problems that may develop before the next visit here. Total time spent: 30 mins including face to face time and time spent for planning, charting and co-ordination of care   Harriette Ohara, MD 05/20/20

## 2020-05-20 NOTE — Patient Instructions (Signed)
[facility]  Discharge Instructions: Thank you for choosing [facility] to provide your oncology and hematology care.   If you have a lab appointment with the Cancer Center, please go directly to the Cancer Center and check in at the registration area.   Wear comfortable clothing and clothing appropriate for easy access to any Portacath or PICC line.   We strive to give you quality time with your provider. You may need to reschedule your appointment if you arrive late (15 or more minutes).  Arriving late affects you and other patients whose appointments are after yours.  Also, if you miss three or more appointments without notifying the office, you may be dismissed from the clinic at the provider's discretion.      For prescription refill requests, have your pharmacy contact our office and allow 72 hours for refills to be completed.    Today you received the following chemotherapy and/or immunotherapy agents Trastuzumab/Pertuzumab, Docetaxel and Carboplatin      To help prevent nausea and vomiting after your treatment, we encourage you to take your nausea medication as directed.  BELOW ARE SYMPTOMS THAT SHOULD BE REPORTED IMMEDIATELY: . *FEVER GREATER THAN 100.4 F (38 C) OR HIGHER . *CHILLS OR SWEATING . *NAUSEA AND VOMITING THAT IS NOT CONTROLLED WITH YOUR NAUSEA MEDICATION . *UNUSUAL SHORTNESS OF BREATH . *UNUSUAL BRUISING OR BLEEDING . *URINARY PROBLEMS (pain or burning when urinating, or frequent urination) . *BOWEL PROBLEMS (unusual diarrhea, constipation, pain near the anus) . TENDERNESS IN MOUTH AND THROAT WITH OR WITHOUT PRESENCE OF ULCERS (sore throat, sores in mouth, or a toothache) . UNUSUAL RASH, SWELLING OR PAIN  . UNUSUAL VAGINAL DISCHARGE OR ITCHING   Items with * indicate a potential emergency and should be followed up as soon as possible or go to the Emergency Department if any problems should occur.  Please show the  CHEMOTHERAPY ALERT CARD or IMMUNOTHERAPY ALERT CARD at check-in to the Emergency Department and triage nurse.  Should you have questions after your visit or need to cancel or reschedule your appointment, please contact [facility]  Dept: [phone]  and follow the prompts.  Office hours are 8:00 a.m. to 4:30 p.m. Monday - Friday. Please note that voicemails left after 4:00 p.m. may not be returned until the following business day.  We are closed weekends and major holidays. You have access to a nurse at all times for urgent questions. Please call the main number to the clinic Dept: [phone] and follow the prompts.   For any non-urgent questions, you may also contact your provider using MyChart. We now offer e-Visits for anyone 18 and older to request care online for non-urgent symptoms. For details visit [email or web address].   Also download the MyChart app! Go to the app store, search "MyChart", open the app, select [facility], and log in with your MyChart username and password.  Due to Covid, a mask is required upon entering the hospital/clinic. If you do not have a mask, one will be given to you upon arrival. For doctor visits, patients may have 1 support person aged 18 or older with them. For treatment visits, patients cannot have anyone with them due to current Covid guidelines and our immunocompromised population.   

## 2020-05-20 NOTE — Assessment & Plan Note (Signed)
03/04/2020:Palpable right breast mass x6 months. Mammogram revealed left breast cysts (intraductal papilloma), 11 cm right breast mass and 2 masses 2.6 cm each in the right axilla: Biopsy grade 3 IDC ER 5% weak, PR 0%, Ki-67 25%, HER-2 3+ IHC positive  Treatment Plan: 1. Neoadjuvant chemotherapy with TCH Perjeta 6 cycles followed by Herceptin Perjeta maintenance versus Kadcyla maintenance (based on response to neoadjuvant chemo) for 1 year 2. Followed bymastectomywith sentinel lymph node study 3. Followed by adjuvant radiation therapy 4.Followed by antiestrogen therapy 5.Consideration for neratinib ----------------------------------------------------------------------------------------------------------------- 03/15/20: Bone Scan: Subtle uptake in ribs: Non diagnostic 03/16/20: CT CAP: Large Rt breast mass with skin thickening, small sat lesion 1.8 cm, multiple enlarged LN, 4 mm LLL nodule ---------------------------------------------------------------------------------------------------------------- Current Treatment: Cycle4TCHP ECHO 03/16/20: EF 65-70% Labs reviewed.  Chemo toxicities: 1.Nausea: Improved with Compazine 2.diarrhea: Few episodes of diarrhea well controlled with Imodium 3.Fatigue: Fatigue has lasted for 10 days with the last cycle 4.Alopecia 5.Hyperglycemia: I discontinued oral dexamethasone. Instructed her to watch her carbs. Monitoring her blood counts very closely. In spite of mild diarrhea her electrolytes and kidney function are excellent today. RTC in3 weeks for cycle 5

## 2020-05-26 ENCOUNTER — Telehealth: Payer: Self-pay | Admitting: *Deleted

## 2020-05-26 NOTE — Telephone Encounter (Signed)
Left vm regarding appt to see Dr. Donne Hazel to discuss sx options. 6/22 arrive at 3:30. Provided address and phone number. Contact information provided for questions or needs.

## 2020-05-28 ENCOUNTER — Telehealth: Payer: Self-pay | Admitting: *Deleted

## 2020-05-28 NOTE — Telephone Encounter (Signed)
Spoke to pt regarding post neo MRI as well as appt with Dr. Donne Hazel to discuss sx plan. Received verbal understanding. Discussed moving MRI up to earlier date. Pt agreed. msg sent to scheduling regarding new MRI date. No further needs voiced.

## 2020-06-09 NOTE — Assessment & Plan Note (Signed)
03/04/2020:Palpable right breast mass x6 months. Mammogram revealed left breast cysts (intraductal papilloma), 11 cm right breast mass and 2 masses 2.6 cm each in the right axilla: Biopsy grade 3 IDC ER 5% weak, PR 0%, Ki-67 25%, HER-2 3+ IHC positive  Treatment Plan: 1. Neoadjuvant chemotherapy with TCH Perjeta 6 cycles followed by Herceptin Perjeta maintenance versus Kadcyla maintenance (based on response to neoadjuvant chemo) for 1 year 2. Followed bymastectomywith sentinel lymph node study 3. Followed by adjuvant radiation therapy 4.Followed by antiestrogen therapy 5.Consideration for neratinib ----------------------------------------------------------------------------------------------------------------- 03/15/20: Bone Scan: Subtle uptake in ribs: Non diagnostic 03/16/20: CT CAP: Large Rt breast mass with skin thickening, small sat lesion 1.8 cm, multiple enlarged LN, 4 mm LLL nodule ---------------------------------------------------------------------------------------------------------------- Current Treatment: Cycle5TCHP ECHO 03/16/20: EF 65-70% Labs reviewed.  Chemo toxicities: 1.Nausea: Improved with Compazine 2.diarrhea:Few episodes of diarrhea well controlled with Imodium 3.Fatigue:Fatigue has lasted for 10 days with the last cycle 4.Alopecia 5.Hyperglycemia: Discontinued dexamethasone.   6.  Chemo induced peripheral neuropathy: I sent a prescription for gabapentin.  I decreased the dosage of Taxotere.  If the neuropathy continues to get worse then we may have to discontinue it. 7.  Chemo induced anemia: Hemoglobin is 10.8  Monitoring her blood counts very closely.   RTC in3 weeks for cycle6

## 2020-06-09 NOTE — Progress Notes (Signed)
Patient Care Team: [name], MD as PCP - General (Hematology and Oncology) Mauro Kaufmann, RN as Oncology Nurse Navigator Rockwell Germany, RN as Oncology Nurse Navigator  DIAGNOSIS:    ICD-10-CM   1. Malignant neoplasm of upper-outer quadrant of right breast in female, estrogen receptor positive ([place])  C50.411    Z17.0     SUMMARY OF ONCOLOGIC HISTORY: Oncology History  Malignant neoplasm of upper-outer quadrant of right breast in female, estrogen receptor positive ([place])  03/10/2020 Initial Diagnosis   Palpable right breast mass x6 months.  Mammogram revealed left breast cysts (intraductal papilloma), 11 cm right breast mass and 2 masses 2.6 cm each in the right axilla: Biopsy grade 3 IDC ER 5% weak, PR 0%, Ki-67 25%, HER-2 3+ IHC positive   03/10/2020 Cancer Staging   Staging form: Breast, AJCC 8th Edition - Clinical stage from 03/10/2020: Stage IIIA (cT3, cN1, cM0, G3, ER+, PR-, HER2+) - Signed by [name], MD on 03/10/2020 Stage prefix: Initial diagnosis   03/19/2020 -  Chemotherapy    Patient is on Treatment Plan: BREAST  DOCETAXEL + CARBOPLATIN + TRASTUZUMAB + PERTUZUMAB  (TCHP) Q21D         CHIEF COMPLIANT: Cycle 5 TCH Perjeta  INTERVAL HISTORY: Sabrina Woods is a 65 y.o. with above-mentioned history of right breast cancer who is currently neoadjuvant chemotherapy with [place]. She presents to the clinic today for cycle 5.   ALLERGIES:  has No Known Allergies.  MEDICATIONS:  Current Outpatient Medications  Medication Sig Dispense Refill  . ALPRAZolam (XANAX) 0.25 MG tablet Take 1 tablet (0.25 mg total) by mouth 2 (two) times daily as needed for anxiety. 60 tablet 3  . esomeprazole (NEXIUM) 20 MG capsule Take by mouth daily.    Marland Kitchen gabapentin (NEURONTIN) 300 MG capsule Take 1 capsule (300 mg total) by mouth 3 (three) times daily. 90 capsule 1  . lidocaine-prilocaine (EMLA) cream Apply to affected area once 30 g 3  . Multiple Vitamin (MULTIVITAMIN) capsule  Take 1 capsule by mouth daily.    . nicotine (NICODERM CQ) 14 mg/24hr patch Place 1 patch (14 mg total) onto the skin daily. 14 patch 0  . nicotine (NICODERM CQ) 7 mg/24hr patch Place 1 patch (7 mg total) onto the skin daily. 28 patch 0  . ondansetron (ZOFRAN) 8 MG tablet Take 1 tablet (8 mg total) by mouth 2 (two) times daily as needed (Nausea or vomiting). Start on the third day after chemotherapy. 30 tablet 1  . prochlorperazine (COMPAZINE) 10 MG tablet Take 1 tablet (10 mg total) by mouth every 6 (six) hours as needed (Nausea or vomiting). 30 tablet 1   Current Facility-Administered Medications  Medication Dose Route Frequency Provider Last Rate Last Admin  . levonorgestrel (MIRENA) 20 MCG/24HR IUD   Intrauterine Once Bennetta Laos, MD        PHYSICAL EXAMINATION: ECOG PERFORMANCE STATUS: 1 - Symptomatic but completely ambulatory  Vitals:   06/10/20 0927  BP: 133/77  Pulse: 88  Resp: 17  Temp: 98.1 F (36.7 C)  SpO2: 99%   Filed Weights   06/10/20 0927  Weight: 204 lb 6.4 oz (92.7 kg)    LABORATORY DATA:  I have reviewed the data as listed CMP Latest Ref Rng & Units 06/10/2020 05/20/2020 04/29/2020  Glucose 70 - 99 mg/dL 200(H) 210(H) 243(H)  BUN 8 - 23 mg/dL _0 Creatinine 0.44 - 1.00 mg/dL 0.73 0.73 0.73  Sodium 135 - 145  mmol/L 142 141 138  Potassium 3.5 - 5.1 mmol/L 3.9 3.7 3.9  Chloride 98 - 111 mmol/L 105 107 104  CO2 22 - 32 mmol/L _0 Calcium 8.9 - 10.3 mg/dL 9.6 9.3 9.3  Total Protein 6.5 - 8.1 g/dL 6.7 6.4(L) 6.7  Total Bilirubin 0.3 - 1.2 mg/dL 0.3 0.3 0.3  Alkaline Phos 38 - 126 U/L 85 84 90  AST 15 - 41 U/L 14(L) 16 16  ALT 0 - 44 U/L _1 Lab Results  Component Value Date   WBC 6.0 06/10/2020   HGB 10.8 (L) 06/10/2020   HCT 33.0 (L) 06/10/2020   MCV 97.1 06/10/2020   PLT 248 06/10/2020   NEUTROABS 3.7 06/10/2020    ASSESSMENT & PLAN:  Malignant neoplasm of upper-outer quadrant of right breast in female, estrogen receptor  positive ([place]) 03/04/2020:Palpable right breast mass x6 months. Mammogram revealed left breast cysts (intraductal papilloma), 11 cm right breast mass and 2 masses 2.6 cm each in the right axilla: Biopsy grade 3 IDC ER 5% weak, PR 0%, Ki-67 25%, HER-2 3+ IHC positive  Treatment Plan: 1. Neoadjuvant chemotherapy with TCH Perjeta 6 cycles followed by Herceptin Perjeta maintenance versus Kadcyla maintenance (based on response to neoadjuvant chemo) for 1 year 2. Followed bymastectomywith sentinel lymph node study 3. Followed by adjuvant radiation therapy 4.Followed by antiestrogen therapy 5.Consideration for neratinib ----------------------------------------------------------------------------------------------------------------- 03/15/20: Bone Scan: Subtle uptake in ribs: Non diagnostic 03/16/20: CT CAP: Large Rt breast mass with skin thickening, small sat lesion 1.8 cm, multiple enlarged LN, 4 mm LLL nodule ---------------------------------------------------------------------------------------------------------------- Current Treatment: Cycle5TCHP ECHO 03/16/20: EF 65-70% Labs reviewed.  Chemo toxicities: 1.Nausea: Improved with Compazine 2.diarrhea:Few episodes of diarrhea well controlled with Imodium 3.Fatigue:For 7 to 10 days 4.Alopecia 5.Hyperglycemia: Discontinued dexamethasone.  Today her blood sugar is 200.   6.  Chemo induced peripheral neuropathy: Tolerating gabapentin well.  She tells me that the neuropathy is better.  Hot flashes are also better. 7.  Chemo induced anemia: Hemoglobin is 10.8  Monitoring her blood counts very closely.  She has a breast MRI appointment on 06/22/2020.  She is appointment with Dr. Donne Hazel. RTC in3 weeks for cycle6    No orders of the defined types were placed in this encounter.  The patient has a good understanding of the overall plan. she agrees with it. she will call with any problems that may develop before the next  visit here.  Total time spent: 30 mins including face to face time and time spent for planning, charting and coordination of care  Sabrina Eisenmenger, MD, MPH 06/10/2020  I, Cloyde Reams Dorshimer, am acting as scribe for Dr. [name].  I have reviewed the above documentation for accuracy and completeness, and I agree with the above.

## 2020-06-10 ENCOUNTER — Other Ambulatory Visit: Payer: Managed Care, Other (non HMO)

## 2020-06-10 ENCOUNTER — Other Ambulatory Visit: Payer: Self-pay | Admitting: *Deleted

## 2020-06-10 ENCOUNTER — Other Ambulatory Visit: Payer: Self-pay

## 2020-06-10 ENCOUNTER — Inpatient Hospital Stay: Payer: Managed Care, Other (non HMO)

## 2020-06-10 ENCOUNTER — Inpatient Hospital Stay: Payer: Managed Care, Other (non HMO) | Attending: Hematology and Oncology | Admitting: Hematology and Oncology

## 2020-06-10 VITALS — BP 127/70 | HR 83 | Temp 97.6°F | Resp 17

## 2020-06-10 DIAGNOSIS — D6481 Anemia due to antineoplastic chemotherapy: Secondary | ICD-10-CM | POA: Diagnosis not present

## 2020-06-10 DIAGNOSIS — Z5111 Encounter for antineoplastic chemotherapy: Secondary | ICD-10-CM | POA: Diagnosis present

## 2020-06-10 DIAGNOSIS — C50411 Malignant neoplasm of upper-outer quadrant of right female breast: Secondary | ICD-10-CM

## 2020-06-10 DIAGNOSIS — Z170 Estrogen receptor positive status [ER+]: Secondary | ICD-10-CM | POA: Diagnosis not present

## 2020-06-10 DIAGNOSIS — Z5112 Encounter for antineoplastic immunotherapy: Secondary | ICD-10-CM | POA: Insufficient documentation

## 2020-06-10 DIAGNOSIS — G620 Drug-induced polyneuropathy: Secondary | ICD-10-CM | POA: Insufficient documentation

## 2020-06-10 DIAGNOSIS — Z95828 Presence of other vascular implants and grafts: Secondary | ICD-10-CM

## 2020-06-10 DIAGNOSIS — Z5181 Encounter for therapeutic drug level monitoring: Secondary | ICD-10-CM

## 2020-06-10 LAB — CBC WITH DIFFERENTIAL (CANCER CENTER ONLY)
Abs Immature Granulocytes: 0.01 10*3/uL (ref 0.00–0.07)
Basophils Absolute: 0.1 10*3/uL (ref 0.0–0.1)
Basophils Relative: 1 %
Eosinophils Absolute: 0 10*3/uL (ref 0.0–0.5)
Eosinophils Relative: 0 %
HCT: 33 % — ABNORMAL LOW (ref 36.0–46.0)
Hemoglobin: 10.8 g/dL — ABNORMAL LOW (ref 12.0–15.0)
Immature Granulocytes: 0 %
Lymphocytes Relative: 25 %
Lymphs Abs: 1.5 10*3/uL (ref 0.7–4.0)
MCH: 31.8 pg (ref 26.0–34.0)
MCHC: 32.7 g/dL (ref 30.0–36.0)
MCV: 97.1 fL (ref 80.0–100.0)
Monocytes Absolute: 0.7 10*3/uL (ref 0.1–1.0)
Monocytes Relative: 12 %
Neutro Abs: 3.7 10*3/uL (ref 1.7–7.7)
Neutrophils Relative %: 62 %
Platelet Count: 248 10*3/uL (ref 150–400)
RBC: 3.4 MIL/uL — ABNORMAL LOW (ref 3.87–5.11)
RDW: 16.1 % — ABNORMAL HIGH (ref 11.5–15.5)
WBC Count: 6 10*3/uL (ref 4.0–10.5)
nRBC: 0 % (ref 0.0–0.2)

## 2020-06-10 LAB — CMP (CANCER CENTER ONLY)
ALT: 14 U/L (ref 0–44)
AST: 14 U/L — ABNORMAL LOW (ref 15–41)
Albumin: 3.5 g/dL (ref 3.5–5.0)
Alkaline Phosphatase: 85 U/L (ref 38–126)
Anion gap: 12 (ref 5–15)
BUN: 9 mg/dL (ref 8–23)
CO2: 25 mmol/L (ref 22–32)
Calcium: 9.6 mg/dL (ref 8.9–10.3)
Chloride: 105 mmol/L (ref 98–111)
Creatinine: 0.73 mg/dL (ref 0.44–1.00)
GFR, Estimated: >60 mL/min (ref >60)
Glucose, Bld: 200 mg/dL — ABNORMAL HIGH (ref 70–99)
Potassium: 3.9 mmol/L (ref 3.5–5.1)
Sodium: 142 mmol/L (ref 135–145)
Total Bilirubin: 0.3 mg/dL (ref 0.3–1.2)
Total Protein: 6.7 g/dL (ref 6.5–8.1)

## 2020-06-10 MED ORDER — HEPARIN SOD (PORK) LOCK FLUSH 100 UNIT/ML IV SOLN
500.0000 [IU] | Freq: Once | INTRAVENOUS | Status: AC | PRN
  Administered 2020-06-10: 500 [IU]
  Filled 2020-06-10: qty 5

## 2020-06-10 MED ORDER — TRASTUZUMAB-ANNS CHEMO 150 MG IV SOLR
6.0000 mg/kg | Freq: Once | INTRAVENOUS | Status: AC
  Administered 2020-06-10: 567 mg via INTRAVENOUS
  Filled 2020-06-10: qty 27

## 2020-06-10 MED ORDER — SODIUM CHLORIDE 0.9% FLUSH
10.0000 mL | Freq: Once | INTRAVENOUS | Status: AC
  Administered 2020-06-10: 10 mL
  Filled 2020-06-10: qty 10

## 2020-06-10 MED ORDER — SODIUM CHLORIDE 0.9 % IV SOLN
600.0000 mg | Freq: Once | INTRAVENOUS | Status: AC
  Administered 2020-06-10: 600 mg via INTRAVENOUS
  Filled 2020-06-10: qty 60

## 2020-06-10 MED ORDER — SODIUM CHLORIDE 0.9 % IV SOLN
60.0000 mg/m2 | Freq: Once | INTRAVENOUS | Status: AC
  Administered 2020-06-10: 130 mg via INTRAVENOUS
  Filled 2020-06-10: qty 13

## 2020-06-10 MED ORDER — PALONOSETRON HCL INJECTION 0.25 MG/5ML
0.2500 mg | Freq: Once | INTRAVENOUS | Status: AC
  Administered 2020-06-10: 0.25 mg via INTRAVENOUS

## 2020-06-10 MED ORDER — SODIUM CHLORIDE 0.9 % IV SOLN
420.0000 mg | Freq: Once | INTRAVENOUS | Status: AC
  Administered 2020-06-10: 420 mg via INTRAVENOUS
  Filled 2020-06-10: qty 14

## 2020-06-10 MED ORDER — PALONOSETRON HCL INJECTION 0.25 MG/5ML
INTRAVENOUS | Status: AC
  Filled 2020-06-10: qty 5

## 2020-06-10 MED ORDER — DIPHENHYDRAMINE HCL 25 MG PO CAPS
50.0000 mg | ORAL_CAPSULE | Freq: Once | ORAL | Status: AC
  Administered 2020-06-10: 50 mg via ORAL

## 2020-06-10 MED ORDER — DIPHENHYDRAMINE HCL 25 MG PO CAPS
ORAL_CAPSULE | ORAL | Status: AC
  Filled 2020-06-10: qty 2

## 2020-06-10 MED ORDER — ACETAMINOPHEN 325 MG PO TABS
ORAL_TABLET | ORAL | Status: AC
  Filled 2020-06-10: qty 2

## 2020-06-10 MED ORDER — ACETAMINOPHEN 325 MG PO TABS
650.0000 mg | ORAL_TABLET | Freq: Once | ORAL | Status: AC
  Administered 2020-06-10: 650 mg via ORAL

## 2020-06-10 MED ORDER — SODIUM CHLORIDE 0.9 % IV SOLN
10.0000 mg | Freq: Once | INTRAVENOUS | Status: AC
  Administered 2020-06-10: 10 mg via INTRAVENOUS
  Filled 2020-06-10: qty 10

## 2020-06-10 MED ORDER — SODIUM CHLORIDE 0.9% FLUSH
10.0000 mL | INTRAVENOUS | Status: DC | PRN
  Administered 2020-06-10: 10 mL
  Filled 2020-06-10: qty 10

## 2020-06-10 MED ORDER — SODIUM CHLORIDE 0.9 % IV SOLN
Freq: Once | INTRAVENOUS | Status: AC
  Filled 2020-06-10: qty 250

## 2020-06-10 MED ORDER — SODIUM CHLORIDE 0.9 % IV SOLN
150.0000 mg | Freq: Once | INTRAVENOUS | Status: AC
  Administered 2020-06-10: 150 mg via INTRAVENOUS
  Filled 2020-06-10: qty 150

## 2020-06-10 NOTE — Patient Instructions (Signed)
[facility]  Discharge Instructions: Thank you for choosing [facility] to provide your oncology and hematology care.   If you have a lab appointment with the [facility], please go directly to the [facility] and check in at the registration area.   Wear comfortable clothing and clothing appropriate for easy access to any Portacath or PICC line.   We strive to give you quality time with your provider. You may need to reschedule your appointment if you arrive late (15 or more minutes).  Arriving late affects you and other patients whose appointments are after yours.  Also, if you miss three or more appointments without notifying the office, you may be dismissed from the clinic at the provider's discretion.      For prescription refill requests, have your pharmacy contact our office and allow 72 hours for refills to be completed.    Today you received the following chemotherapy and/or immunotherapy agents: trastuzumab/pertuzumab/docetaxel/carboplatin.      To help prevent nausea and vomiting after your treatment, we encourage you to take your nausea medication as directed.  BELOW ARE SYMPTOMS THAT SHOULD BE REPORTED IMMEDIATELY: . *FEVER GREATER THAN 100.4 F (38 C) OR HIGHER . *CHILLS OR SWEATING . *NAUSEA AND VOMITING THAT IS NOT CONTROLLED WITH YOUR NAUSEA MEDICATION . *UNUSUAL SHORTNESS OF BREATH . *UNUSUAL BRUISING OR BLEEDING . *URINARY PROBLEMS (pain or burning when urinating, or frequent urination) . *BOWEL PROBLEMS (unusual diarrhea, constipation, pain near the anus) . TENDERNESS IN MOUTH AND THROAT WITH OR WITHOUT PRESENCE OF ULCERS (sore throat, sores in mouth, or a toothache) . UNUSUAL RASH, SWELLING OR PAIN  . UNUSUAL VAGINAL DISCHARGE OR ITCHING   Items with * indicate a potential emergency and should be followed up as soon as possible or go to the Emergency Department if any problems should occur.  Please show the  CHEMOTHERAPY ALERT CARD or IMMUNOTHERAPY ALERT CARD at check-in to the Emergency Department and triage nurse.  Should you have questions after your visit or need to cancel or reschedule your appointment, please contact [facility]  Dept: [phone]  and follow the prompts.  Office hours are 8:00 a.m. to 4:30 p.m. Monday - Friday. Please note that voicemails left after 4:00 p.m. may not be returned until the following business day.  We are closed weekends and major holidays. You have access to a nurse at all times for urgent questions. Please call the main number to the clinic Dept: [phone] and follow the prompts.   For any non-urgent questions, you may also contact your provider using MyChart. We now offer e-Visits for anyone 51 and older to request care online for non-urgent symptoms. For details visit [email or web address].   Also download the MyChart app! Go to the app store, search "MyChart", open the app, select [facility], and log in with your MyChart username and password.  Due to Covid, a mask is required upon entering the hospital/clinic. If you do not have a mask, one will be given to you upon arrival. For doctor visits, patients may have 1 support person aged 21 or older with them. For treatment visits, patients cannot have anyone with them due to current Covid guidelines and our immunocompromised population.

## 2020-06-20 ENCOUNTER — Ambulatory Visit
Admission: RE | Admit: 2020-06-20 | Discharge: 2020-06-20 | Disposition: A | Discharge status: Home / Self Care | Payer: Managed Care, Other (non HMO) | Source: Ambulatory Visit | Attending: Hematology and Oncology | Admitting: Hematology and Oncology

## 2020-06-20 DIAGNOSIS — C50411 Malignant neoplasm of upper-outer quadrant of right female breast: Secondary | ICD-10-CM

## 2020-06-20 MED ORDER — GADOBUTROL 1 MMOL/ML IV SOLN
10.0000 mL | Freq: Once | INTRAVENOUS | Status: AC | PRN
  Administered 2020-06-20: 10 mL via INTRAVENOUS

## 2020-06-22 ENCOUNTER — Telehealth: Payer: Self-pay | Admitting: *Deleted

## 2020-06-22 NOTE — Telephone Encounter (Signed)
Connected with [employer] () regarding voicemail with 'Question about [employer].  Requested Disability not yet received." Today reports "Spoke with records team this morning.  Everything is being taken care of.  Wanted to check to ensure records sent before 06/30/2020."  No current questions or further needs.

## 2020-06-24 ENCOUNTER — Other Ambulatory Visit: Payer: Self-pay | Admitting: General Surgery

## 2020-06-29 ENCOUNTER — Other Ambulatory Visit: Payer: Self-pay

## 2020-06-29 ENCOUNTER — Ambulatory Visit (HOSPITAL_COMMUNITY)
Admission: RE | Admit: 2020-06-29 | Discharge: 2020-06-29 | Disposition: A | Discharge status: Home / Self Care | Payer: Managed Care, Other (non HMO) | Source: Ambulatory Visit | Attending: Hematology and Oncology | Admitting: Hematology and Oncology

## 2020-06-29 DIAGNOSIS — I517 Cardiomegaly: Secondary | ICD-10-CM | POA: Diagnosis not present

## 2020-06-29 DIAGNOSIS — Z0189 Encounter for other specified special examinations: Secondary | ICD-10-CM | POA: Diagnosis not present

## 2020-06-29 DIAGNOSIS — T451X5D Adverse effect of antineoplastic and immunosuppressive drugs, subsequent encounter: Secondary | ICD-10-CM | POA: Diagnosis not present

## 2020-06-29 DIAGNOSIS — C50919 Malignant neoplasm of unspecified site of unspecified female breast: Secondary | ICD-10-CM | POA: Diagnosis not present

## 2020-06-29 DIAGNOSIS — Z79899 Other long term (current) drug therapy: Secondary | ICD-10-CM | POA: Diagnosis not present

## 2020-06-29 DIAGNOSIS — Z5181 Encounter for therapeutic drug level monitoring: Secondary | ICD-10-CM | POA: Diagnosis not present

## 2020-06-29 LAB — ECHOCARDIOGRAM COMPLETE
Area-P 1/2: 5.42 cm2
S' Lateral: 2.6 cm

## 2020-06-29 NOTE — Progress Notes (Signed)
  Echocardiogram 2D Echocardiogram has been performed.  Matilde Bash 06/29/2020, 11:48 AM

## 2020-06-30 NOTE — Assessment & Plan Note (Signed)
03/04/2020:Palpable right breast mass x6 months. Mammogram revealed left breast cysts (intraductal papilloma), 11 cm right breast mass and 2 masses 2.6 cm each in the right axilla: Biopsy grade 3 IDC ER 5% weak, PR 0%, Ki-67 25%, HER-2 3+ IHC positive  Treatment Plan: 1. Neoadjuvant chemotherapy with TCH Perjeta 6 cycles followed by Herceptin Perjeta maintenance versus Kadcyla maintenance (based on response to neoadjuvant chemo) for 1 year 2. Followed bymastectomywith sentinel lymph node study 3. Followed by adjuvant radiation therapy 4.Followed by antiestrogen therapy 5.Consideration for neratinib ----------------------------------------------------------------------------------------------------------------- 03/15/20: Bone Scan: Subtle uptake in ribs: Non diagnostic 03/16/20: CT CAP: Large Rt breast mass with skin thickening, small sat lesion 1.8 cm, multiple enlarged LN, 4 mm LLL nodule ---------------------------------------------------------------------------------------------------------------- Current Treatment: Cycle5TCHP ECHO 03/16/20: EF 65-70% Labs reviewed.  Chemo toxicities: 1.Nausea: Improved with Compazine 2.diarrhea:Few episodes of diarrhea well controlled with Imodium 3.Fatigue:For 7 to 10 days 4.Alopecia 5.Hyperglycemia:Discontinueddexamethasone.  Today her blood sugar is 200. 6.Chemo induced peripheral neuropathy: Tolerating gabapentin well.  She tells me that the neuropathy is better.  Hot flashes are also better. 7.Chemo induced anemia: Hemoglobin is 10.8  Monitoring her blood counts very closely. breast MRI appointment on 06/22/2020.  Necrotic mass 9 cm (was 10.5 cm), Skin thickening and enhancement less prominent.Sat mass 1.7 cm ( was 2.2), Enl Right Ax LN 2.6 cm  Unfortunately MRI doesn't show a good response to chemo. RTC after surgery to discuss the results

## 2020-06-30 NOTE — Progress Notes (Signed)
Patient Care Team: [name], MD as PCP - General (Hematology and Oncology) Pershing Proud, RN as Oncology Nurse Navigator Donnelly Angelica, RN as Oncology Nurse Navigator  DIAGNOSIS:    ICD-10-CM   1. Malignant neoplasm of upper-outer quadrant of right breast in female, estrogen receptor positive (HCC)  C50.411    Z17.0       SUMMARY OF ONCOLOGIC HISTORY: Oncology History  Malignant neoplasm of upper-outer quadrant of right breast in female, estrogen receptor positive (HCC)  03/10/2020 Initial Diagnosis   Palpable right breast mass x6 months.  Mammogram revealed left breast cysts (intraductal papilloma), 11 cm right breast mass and 2 masses 2.6 cm each in the right axilla: Biopsy grade 3 IDC ER 5% weak, PR 0%, Ki-67 25%, HER-2 3+ IHC positive   03/10/2020 Cancer Staging   Staging form: Breast, AJCC 8th Edition - Clinical stage from 03/10/2020: Stage IIIA (cT3, cN1, cM0, G3, ER+, PR-, HER2+) - Signed by [name], MD on 03/10/2020  Stage prefix: Initial diagnosis    03/19/2020 -  Chemotherapy    Patient is on Treatment Plan: BREAST  DOCETAXEL + CARBOPLATIN + TRASTUZUMAB + PERTUZUMAB  (TCHP) Q21D          CHIEF COMPLIANT: Cycle 6 TCH Perjeta  INTERVAL HISTORY: Sabrina Woods is a 65 y.o. with above-mentioned history of right breast cancer who is on currently neoadjuvant chemotherapy with TCH Perjeta. She had a mammogram on 06/18 that revealed necrotic mass in right breast of 9 cm, previously being measured at 10.5 cm. The smaller satellite mass noted had decreased from 2.2 cm to 1.7 cm. She presents to the clinic today for cycle 6. Her major complaint is related to fatigue and tiredness. Neuropathy appears to be stable. Did not have any nausea or vomiting.  She does have intermittent loose stools.  ALLERGIES:  has No Known Allergies.  MEDICATIONS:  Current Outpatient Medications  Medication Sig Dispense Refill   ALPRAZolam (XANAX) 0.25 MG tablet Take 1 tablet (0.25  mg total) by mouth 2 (two) times daily as needed for anxiety. 60 tablet 3   esomeprazole (NEXIUM) 20 MG capsule Take by mouth daily.     gabapentin (NEURONTIN) 300 MG capsule Take 1 capsule (300 mg total) by mouth 3 (three) times daily. 90 capsule 1   lidocaine-prilocaine (EMLA) cream Apply to affected area once 30 g 3   Multiple Vitamin (MULTIVITAMIN) capsule Take 1 capsule by mouth daily.     nicotine (NICODERM CQ) 14 mg/24hr patch Place 1 patch (14 mg total) onto the skin daily. 14 patch 0   nicotine (NICODERM CQ) 7 mg/24hr patch Place 1 patch (7 mg total) onto the skin daily. 28 patch 0   ondansetron (ZOFRAN) 8 MG tablet Take 1 tablet (8 mg total) by mouth 2 (two) times daily as needed (Nausea or vomiting). Start on the third day after chemotherapy. 30 tablet 1   prochlorperazine (COMPAZINE) 10 MG tablet Take 1 tablet (10 mg total) by mouth every 6 (six) hours as needed (Nausea or vomiting). 30 tablet 1   Current Facility-Administered Medications  Medication Dose Route Frequency Provider Last Rate Last Admin   levonorgestrel (MIRENA) 20 MCG/24HR IUD   Intrauterine Once Trellis Paganini, MD        PHYSICAL EXAMINATION: ECOG PERFORMANCE STATUS: 1 - Symptomatic but completely ambulatory  Vitals:   07/01/20 0922  BP: (!) 127/55  Pulse: 96  Resp: 18  Temp: 98.1 F (36.7 C)  SpO2: 100%  Filed Weights   07/01/20 0922  Weight: 203 lb 3.2 oz (92.2 kg)     LABORATORY DATA:  I have reviewed the data as listed CMP Latest Ref Rng & Units 07/01/2020 06/10/2020 05/20/2020  Glucose 70 - 99 mg/dL 175(H) 200(H) 210(H)  BUN 8 - 23 mg/dL $Remove'9 9 8  'lgYBDzB$ Creatinine 0.44 - 1.00 mg/dL 0.74 0.73 0.73  Sodium 135 - 145 mmol/L 139 142 141  Potassium 3.5 - 5.1 mmol/L 3.9 3.9 3.7  Chloride 98 - 111 mmol/L 105 105 107  CO2 22 - 32 mmol/L $RemoveB'24 25 25  'XdNhJmIL$ Calcium 8.9 - 10.3 mg/dL 9.3 9.6 9.3  Total Protein 6.5 - 8.1 g/dL 6.8 6.7 6.4(L)  Total Bilirubin 0.3 - 1.2 mg/dL 0.3 0.3 0.3  Alkaline Phos 38 - 126 U/L 91  85 84  AST 15 - 41 U/L 12(L) 14(L) 16  ALT 0 - 44 U/L $Remo'12 14 17    'ReaQp$ Lab Results  Component Value Date   WBC 7.4 07/01/2020   HGB 10.7 (L) 07/01/2020   HCT 32.1 (L) 07/01/2020   MCV 96.7 07/01/2020   PLT 249 07/01/2020   NEUTROABS 4.8 07/01/2020    ASSESSMENT & PLAN:  Malignant neoplasm of upper-outer quadrant of right breast in female, estrogen receptor positive ([place]) 03/04/2020:Palpable right breast mass x6 months.  Mammogram revealed left breast cysts (intraductal papilloma), 11 cm right breast mass and 2 masses 2.6 cm each in the right axilla: Biopsy grade 3 IDC ER 5% weak, PR 0%, Ki-67 25%, HER-2 3+ IHC positive   Treatment Plan: 1. Neoadjuvant chemotherapy with TCH Perjeta 6 cycles followed by Herceptin Perjeta maintenance versus Kadcyla maintenance (based on response to neoadjuvant chemo) for 1 year 2. Followed by mastectomy with sentinel lymph node study 3. Followed by adjuvant radiation therapy 4.  Followed by antiestrogen therapy 5.  Consideration for neratinib ----------------------------------------------------------------------------------------------------------------- 03/15/20: Bone Scan: Subtle uptake in ribs: Non diagnostic 03/16/20: CT CAP: Large Rt breast mass with skin thickening, small sat lesion 1.8 cm, multiple enlarged LN, 4 mm LLL nodule ---------------------------------------------------------------------------------------------------------------- Current Treatment: Cycle 5 TCHP ECHO 03/16/20: EF 65-70% Labs reviewed.   Chemo toxicities: 1.  Nausea: Improved with Compazine 2. diarrhea:  Few episodes of diarrhea well controlled with Imodium 3.  Fatigue: For 7 to 10 days 4.  Alopecia 5.  Hyperglycemia: Discontinued dexamethasone.  Today her blood sugar is 175  6.  Chemo induced peripheral neuropathy: Tolerating gabapentin well.  She tells me that the neuropathy is better.  Hot flashes are also better. 7.  Chemo induced anemia: Hemoglobin is 10.7   Monitoring  her blood counts very closely. breast MRI appointment on 06/22/2020.  Necrotic mass 9 cm (was 10.5 cm), Skin thickening and enhancement less prominent.Sat mass 1.7 cm ( was 2.2), Enl Right Ax LN 2.6 cm  She will most likely receive Kadcyla for adjuvant therapy after surgery. In 3 weeks we will set her up for Herceptin Perjeta maintenance.  RTC after surgery to discuss the results  No orders of the defined types were placed in this encounter.  The patient has a good understanding of the overall plan. she agrees with it. she will call with any problems that may develop before the next visit here.  Total time spent: 30 mins including face to face time and time spent for planning, charting and coordination of care  Rulon Eisenmenger, MD, MPH 07/01/2020  I, Reinaldo Raddle, am acting as scribe for Dr. [name], MD. I have reviewed the above documentation  for accuracy and completeness, and I agree with the above.

## 2020-07-01 ENCOUNTER — Inpatient Hospital Stay: Payer: Managed Care, Other (non HMO)

## 2020-07-01 ENCOUNTER — Other Ambulatory Visit: Payer: Self-pay

## 2020-07-01 ENCOUNTER — Other Ambulatory Visit: Payer: Managed Care, Other (non HMO)

## 2020-07-01 ENCOUNTER — Inpatient Hospital Stay (HOSPITAL_BASED_OUTPATIENT_CLINIC_OR_DEPARTMENT_OTHER): Payer: Managed Care, Other (non HMO) | Admitting: Hematology and Oncology

## 2020-07-01 ENCOUNTER — Telehealth: Payer: Self-pay

## 2020-07-01 ENCOUNTER — Encounter: Payer: Self-pay | Admitting: *Deleted

## 2020-07-01 VITALS — BP 150/74 | HR 77 | Temp 97.9°F | Resp 17

## 2020-07-01 DIAGNOSIS — C50411 Malignant neoplasm of upper-outer quadrant of right female breast: Secondary | ICD-10-CM

## 2020-07-01 DIAGNOSIS — Z170 Estrogen receptor positive status [ER+]: Secondary | ICD-10-CM

## 2020-07-01 DIAGNOSIS — Z5112 Encounter for antineoplastic immunotherapy: Secondary | ICD-10-CM | POA: Diagnosis not present

## 2020-07-01 LAB — CBC WITH DIFFERENTIAL (CANCER CENTER ONLY)
Abs Immature Granulocytes: 0.02 10*3/uL (ref 0.00–0.07)
Basophils Absolute: 0.1 10*3/uL (ref 0.0–0.1)
Basophils Relative: 1 %
Eosinophils Absolute: 0 10*3/uL (ref 0.0–0.5)
Eosinophils Relative: 0 %
HCT: 32.1 % — ABNORMAL LOW (ref 36.0–46.0)
Hemoglobin: 10.7 g/dL — ABNORMAL LOW (ref 12.0–15.0)
Immature Granulocytes: 0 %
Lymphocytes Relative: 23 %
Lymphs Abs: 1.7 10*3/uL (ref 0.7–4.0)
MCH: 32.2 pg (ref 26.0–34.0)
MCHC: 33.3 g/dL (ref 30.0–36.0)
MCV: 96.7 fL (ref 80.0–100.0)
Monocytes Absolute: 0.8 10*3/uL (ref 0.1–1.0)
Monocytes Relative: 11 %
Neutro Abs: 4.8 10*3/uL (ref 1.7–7.7)
Neutrophils Relative %: 65 %
Platelet Count: 249 10*3/uL (ref 150–400)
RBC: 3.32 MIL/uL — ABNORMAL LOW (ref 3.87–5.11)
RDW: 15.4 % (ref 11.5–15.5)
WBC Count: 7.4 10*3/uL (ref 4.0–10.5)
nRBC: 0 % (ref 0.0–0.2)

## 2020-07-01 LAB — CMP (CANCER CENTER ONLY)
ALT: 12 U/L (ref 0–44)
AST: 12 U/L — ABNORMAL LOW (ref 15–41)
Albumin: 3.5 g/dL (ref 3.5–5.0)
Alkaline Phosphatase: 91 U/L (ref 38–126)
Anion gap: 10 (ref 5–15)
BUN: 9 mg/dL (ref 8–23)
CO2: 24 mmol/L (ref 22–32)
Calcium: 9.3 mg/dL (ref 8.9–10.3)
Chloride: 105 mmol/L (ref 98–111)
Creatinine: 0.74 mg/dL (ref 0.44–1.00)
GFR, Estimated: >60 mL/min (ref >60)
Glucose, Bld: 175 mg/dL — ABNORMAL HIGH (ref 70–99)
Potassium: 3.9 mmol/L (ref 3.5–5.1)
Sodium: 139 mmol/L (ref 135–145)
Total Bilirubin: 0.3 mg/dL (ref 0.3–1.2)
Total Protein: 6.8 g/dL (ref 6.5–8.1)

## 2020-07-01 MED ORDER — SODIUM CHLORIDE 0.9 % IV SOLN
60.0000 mg/m2 | Freq: Once | INTRAVENOUS | Status: AC
  Administered 2020-07-01: 130 mg via INTRAVENOUS
  Filled 2020-07-01: qty 13

## 2020-07-01 MED ORDER — SODIUM CHLORIDE 0.9% FLUSH
10.0000 mL | INTRAVENOUS | Status: DC | PRN
  Administered 2020-07-01: 10 mL
  Filled 2020-07-01: qty 10

## 2020-07-01 MED ORDER — DIPHENHYDRAMINE HCL 25 MG PO CAPS
ORAL_CAPSULE | ORAL | Status: AC
  Filled 2020-07-01: qty 1

## 2020-07-01 MED ORDER — PALONOSETRON HCL INJECTION 0.25 MG/5ML
0.2500 mg | Freq: Once | INTRAVENOUS | Status: AC
  Administered 2020-07-01: 0.25 mg via INTRAVENOUS

## 2020-07-01 MED ORDER — ACETAMINOPHEN 325 MG PO TABS
650.0000 mg | ORAL_TABLET | Freq: Once | ORAL | Status: AC
  Administered 2020-07-01: 650 mg via ORAL

## 2020-07-01 MED ORDER — SODIUM CHLORIDE 0.9 % IV SOLN
6.0000 mg/kg | Freq: Once | INTRAVENOUS | Status: AC
Start: 2020-07-01 — End: 2020-07-01
  Administered 2020-07-01: 567 mg via INTRAVENOUS
  Filled 2020-07-01: qty 27

## 2020-07-01 MED ORDER — PALONOSETRON HCL INJECTION 0.25 MG/5ML
INTRAVENOUS | Status: AC
  Filled 2020-07-01: qty 5

## 2020-07-01 MED ORDER — ACETAMINOPHEN 325 MG PO TABS
ORAL_TABLET | ORAL | Status: AC
  Filled 2020-07-01: qty 2

## 2020-07-01 MED ORDER — SODIUM CHLORIDE 0.9 % IV SOLN
Freq: Once | INTRAVENOUS | Status: AC
  Filled 2020-07-01: qty 250

## 2020-07-01 MED ORDER — SODIUM CHLORIDE 0.9 % IV SOLN
600.0000 mg | Freq: Once | INTRAVENOUS | Status: AC
  Administered 2020-07-01: 600 mg via INTRAVENOUS
  Filled 2020-07-01: qty 60

## 2020-07-01 MED ORDER — SODIUM CHLORIDE 0.9 % IV SOLN
10.0000 mg | Freq: Once | INTRAVENOUS | Status: AC
  Administered 2020-07-01: 10 mg via INTRAVENOUS
  Filled 2020-07-01: qty 10

## 2020-07-01 MED ORDER — SODIUM CHLORIDE 0.9 % IV SOLN
150.0000 mg | Freq: Once | INTRAVENOUS | Status: AC
  Administered 2020-07-01: 150 mg via INTRAVENOUS
  Filled 2020-07-01: qty 150

## 2020-07-01 MED ORDER — HEPARIN SOD (PORK) LOCK FLUSH 100 UNIT/ML IV SOLN
500.0000 [IU] | Freq: Once | INTRAVENOUS | Status: AC | PRN
Start: 2020-07-01 — End: 2020-07-01
  Administered 2020-07-01: 500 [IU]
  Filled 2020-07-01: qty 5

## 2020-07-01 MED ORDER — SODIUM CHLORIDE 0.9 % IV SOLN
420.0000 mg | Freq: Once | INTRAVENOUS | Status: AC
  Administered 2020-07-01: 420 mg via INTRAVENOUS
  Filled 2020-07-01: qty 14

## 2020-07-01 MED ORDER — DIPHENHYDRAMINE HCL 25 MG PO CAPS
50.0000 mg | ORAL_CAPSULE | Freq: Once | ORAL | Status: AC
  Administered 2020-07-01: 50 mg via ORAL

## 2020-07-01 NOTE — Telephone Encounter (Signed)
Spoke with patient regarding concerns about her short term disability claim # [id number]. Release of Information Authorization signed and forwarded to [facility] Department.

## 2020-07-01 NOTE — Patient Instructions (Signed)
[facility] ONCOLOGY  Discharge Instructions: Thank you for choosing [facility] to provide your oncology and hematology care.   If you have a lab appointment with the [facility], please go directly to the [facility] and check in at the registration area.   Wear comfortable clothing and clothing appropriate for easy access to any Portacath or PICC line.   We strive to give you quality time with your provider. You may need to reschedule your appointment if you arrive late (15 or more minutes).  Arriving late affects you and other patients whose appointments are after yours.  Also, if you miss three or more appointments without notifying the office, you may be dismissed from the clinic at the provider's discretion.      For prescription refill requests, have your pharmacy contact our office and allow 72 hours for refills to be completed.    Today you received the following chemotherapy and/or immunotherapy agents: trastuzumab/pertuzumab/docetaxel/carboplatin.      To help prevent nausea and vomiting after your treatment, we encourage you to take your nausea medication as directed.  BELOW ARE SYMPTOMS THAT SHOULD BE REPORTED IMMEDIATELY: *FEVER GREATER THAN 100.4 F (38 C) OR HIGHER *CHILLS OR SWEATING *NAUSEA AND VOMITING THAT IS NOT CONTROLLED WITH YOUR NAUSEA MEDICATION *UNUSUAL SHORTNESS OF BREATH *UNUSUAL BRUISING OR BLEEDING *URINARY PROBLEMS (pain or burning when urinating, or frequent urination) *BOWEL PROBLEMS (unusual diarrhea, constipation, pain near the anus) TENDERNESS IN MOUTH AND THROAT WITH OR WITHOUT PRESENCE OF ULCERS (sore throat, sores in mouth, or a toothache) UNUSUAL RASH, SWELLING OR PAIN  UNUSUAL VAGINAL DISCHARGE OR ITCHING   Items with * indicate a potential emergency and should be followed up as soon as possible or go to the Emergency Department if any problems should occur.  Please show the CHEMOTHERAPY ALERT CARD or  IMMUNOTHERAPY ALERT CARD at check-in to the Emergency Department and triage nurse.  Should you have questions after your visit or need to cancel or reschedule your appointment, please contact [facility]  Dept: [phone]  and follow the prompts.  Office hours are 8:00 a.m. to 4:30 p.m. Monday - Friday. Please note that voicemails left after 4:00 p.m. may not be returned until the following business day.  We are closed weekends and major holidays. You have access to a nurse at all times for urgent questions. Please call the main number to the clinic Dept: [phone] and follow the prompts.   For any non-urgent questions, you may also contact your provider using MyChart. We now offer e-Visits for anyone 9 and older to request care online for non-urgent symptoms. For details visit [email or web address].   Also download the MyChart app! Go to the app store, search "MyChart", open the app, select , and log in with your MyChart username and password.  Due to Covid, a mask is required upon entering the hospital/clinic. If you do not have a mask, one will be given to you upon arrival. For doctor visits, patients may have 1 support person aged 52 or older with them. For treatment visits, patients cannot have anyone with them due to current Covid guidelines and our immunocompromised population.

## 2020-07-02 ENCOUNTER — Telehealth: Payer: Self-pay | Admitting: Hematology and Oncology

## 2020-07-02 NOTE — Telephone Encounter (Signed)
Scheduled appt per 6/29 sch msg. Pt aware.  

## 2020-07-02 NOTE — Telephone Encounter (Signed)
Scheduled per 6/29 los. Called and spoke with pt confirmed 7/20 appt

## 2020-07-22 ENCOUNTER — Other Ambulatory Visit: Payer: Self-pay

## 2020-07-22 ENCOUNTER — Other Ambulatory Visit: Payer: Self-pay | Admitting: Hematology and Oncology

## 2020-07-22 ENCOUNTER — Inpatient Hospital Stay: Payer: Managed Care, Other (non HMO) | Attending: Hematology and Oncology

## 2020-07-22 VITALS — BP 144/72 | HR 79 | Temp 97.8°F | Resp 18 | Wt 202.2 lb

## 2020-07-22 DIAGNOSIS — Z5112 Encounter for antineoplastic immunotherapy: Secondary | ICD-10-CM | POA: Insufficient documentation

## 2020-07-22 DIAGNOSIS — C50411 Malignant neoplasm of upper-outer quadrant of right female breast: Secondary | ICD-10-CM | POA: Insufficient documentation

## 2020-07-22 MED ORDER — TRASTUZUMAB-DKST CHEMO 150 MG IV SOLR: 6.0000 mg/kg | Freq: Once | INTRAVENOUS | Status: DC

## 2020-07-22 MED ORDER — SODIUM CHLORIDE 0.9 % IV SOLN
420.0000 mg | Freq: Once | INTRAVENOUS | Status: AC
  Administered 2020-07-22: 420 mg via INTRAVENOUS
  Filled 2020-07-22: qty 14

## 2020-07-22 MED ORDER — HEPARIN SOD (PORK) LOCK FLUSH 100 UNIT/ML IV SOLN
500.0000 [IU] | Freq: Once | INTRAVENOUS | Status: AC | PRN
  Administered 2020-07-22: 500 [IU]
  Filled 2020-07-22: qty 5

## 2020-07-22 MED ORDER — GABAPENTIN 300 MG PO CAPS: 300.0000 mg | ORAL_CAPSULE | Freq: Three times a day (TID) | ORAL | 1 refills | Status: DC

## 2020-07-22 MED ORDER — SODIUM CHLORIDE 0.9 % IV SOLN
Freq: Once | INTRAVENOUS | Status: AC
  Filled 2020-07-22: qty 250

## 2020-07-22 MED ORDER — SODIUM CHLORIDE 0.9% FLUSH
10.0000 mL | INTRAVENOUS | Status: DC | PRN
  Administered 2020-07-22: 10 mL
  Filled 2020-07-22: qty 10

## 2020-07-22 MED ORDER — ACETAMINOPHEN 325 MG PO TABS
ORAL_TABLET | ORAL | Status: AC
  Filled 2020-07-22: qty 2

## 2020-07-22 MED ORDER — DIPHENHYDRAMINE HCL 25 MG PO CAPS
ORAL_CAPSULE | ORAL | Status: AC
  Filled 2020-07-22: qty 1

## 2020-07-22 MED ORDER — DIPHENHYDRAMINE HCL 25 MG PO CAPS
25.0000 mg | ORAL_CAPSULE | Freq: Once | ORAL | Status: AC
  Administered 2020-07-22: 25 mg via ORAL

## 2020-07-22 MED ORDER — TRASTUZUMAB-ANNS CHEMO 150 MG IV SOLR
6.0000 mg/kg | Freq: Once | INTRAVENOUS | Status: AC
  Administered 2020-07-22: 546 mg via INTRAVENOUS
  Filled 2020-07-22: qty 26

## 2020-07-22 MED ORDER — ACETAMINOPHEN 325 MG PO TABS
650.0000 mg | ORAL_TABLET | Freq: Once | ORAL | Status: AC
  Administered 2020-07-22: 650 mg via ORAL

## 2020-07-22 NOTE — Patient Instructions (Signed)
[facility] ONCOLOGY  Discharge Instructions: Thank you for choosing [facility] to provide your oncology and hematology care.   If you have a lab appointment with the [facility], please go directly to the [facility] and check in at the registration area.   Wear comfortable clothing and clothing appropriate for easy access to any Portacath or PICC line.   We strive to give you quality time with your provider. You may need to reschedule your appointment if you arrive late (15 or more minutes).  Arriving late affects you and other patients whose appointments are after yours.  Also, if you miss three or more appointments without notifying the office, you may be dismissed from the clinic at the provider's discretion.      For prescription refill requests, have your pharmacy contact our office and allow 72 hours for refills to be completed.    Today you received the following chemotherapy and/or immunotherapy agents Trastuzumab/Perjeta      To help prevent nausea and vomiting after your treatment, we encourage you to take your nausea medication as directed.  BELOW ARE SYMPTOMS THAT SHOULD BE REPORTED IMMEDIATELY: *FEVER GREATER THAN 100.4 F (38 C) OR HIGHER *CHILLS OR SWEATING *NAUSEA AND VOMITING THAT IS NOT CONTROLLED WITH YOUR NAUSEA MEDICATION *UNUSUAL SHORTNESS OF BREATH *UNUSUAL BRUISING OR BLEEDING *URINARY PROBLEMS (pain or burning when urinating, or frequent urination) *BOWEL PROBLEMS (unusual diarrhea, constipation, pain near the anus) TENDERNESS IN MOUTH AND THROAT WITH OR WITHOUT PRESENCE OF ULCERS (sore throat, sores in mouth, or a toothache) UNUSUAL RASH, SWELLING OR PAIN  UNUSUAL VAGINAL DISCHARGE OR ITCHING   Items with * indicate a potential emergency and should be followed up as soon as possible or go to the Emergency Department if any problems should occur.  Please show the CHEMOTHERAPY ALERT CARD or IMMUNOTHERAPY ALERT CARD at  check-in to the Emergency Department and triage nurse.  Should you have questions after your visit or need to cancel or reschedule your appointment, please contact [facility]  Dept: [phone]  and follow the prompts.  Office hours are 8:00 a.m. to 4:30 p.m. Monday - Friday. Please note that voicemails left after 4:00 p.m. may not be returned until the following business day.  We are closed weekends and major holidays. You have access to a nurse at all times for urgent questions. Please call the main number to the clinic Dept: [phone] and follow the prompts.   For any non-urgent questions, you may also contact your provider using MyChart. We now offer e-Visits for anyone 31 and older to request care online for non-urgent symptoms. For details visit [email or web address].   Also download the MyChart app! Go to the app store, search "MyChart", open the app, select [facility], and log in with your MyChart username and password.  Due to Covid, a mask is required upon entering the hospital/clinic. If you do not have a mask, one will be given to you upon arrival. For doctor visits, patients may have 1 support person aged 35 or older with them. For treatment visits, patients cannot have anyone with them due to current Covid guidelines and our immunocompromised population.

## 2020-07-27 NOTE — Progress Notes (Signed)
Surgical Instructions    Your procedure is scheduled on Tuesday, August 2nd, 2022.   Report to [facility] Main Entrance "A" at 08:00 A.M., then check in with the Admitting office.  Call this number if you have problems the morning of surgery:  [phone]   If you have any questions prior to your surgery date call [phone]: Open Monday-Friday 8am-4pm    Remember:  Do not eat after midnight the night before your surgery  You may drink clear liquids until 07:00 the morning of your surgery.   Clear liquids allowed are: Water, Non-Citrus Juices (without pulp), Carbonated Beverages, Clear Tea, Black Coffee Only, and Gatorade  Patient Instructions  The night before surgery:  No food after midnight. ONLY clear liquids after midnight  The day of surgery (if you do NOT have diabetes):  Drink ONE (1) Pre-Surgery Clear Ensure by 07:00 the morning of surgery. Drink in one sitting. Do not sip.  This drink was given to you during your hospital  pre-op appointment visit.  Nothing else to drink after completing the  Pre-Surgery Clear Ensure.         If you have questions, please contact your surgeon's office.     Take these medicines the morning of surgery with A SIP OF WATER:  ALPRAZolam Duanne Moron) - if needed esomeprazole (NEXIUM)  gabapentin (NEURONTIN)  As of today, STOP taking any Aspirin (unless otherwise instructed by your surgeon) Aleve, Naproxen, Ibuprofen, Motrin, Advil, Goody's, BC's, all herbal medications, fish oil, and all vitamins.          Do not wear jewelry or makeup Do not wear lotions, powders, perfumes, or deodorant. Do not shave 48 hours prior to surgery.   Do not bring valuables to the hospital. DO Not wear nail polish, gel polish, artificial nails, or any other type of covering on natural nails including finger and toenails. If patients have artificial nails, gel coating, etc. that need to be removed by a nail salon please have this removed prior to surgery or  surgery may need to be canceled/delayed if the surgeon/ anesthesia feels like the patient is unable to be adequately monitored.             [facility] is not responsible for any belongings or valuables.  Do NOT Smoke (Tobacco/Vaping) or drink Alcohol 24 hours prior to your procedure If you use a CPAP at night, you may bring all equipment for your overnight stay.   Contacts, glasses, dentures or bridgework may not be worn into surgery, please bring cases for these belongings   For patients admitted to the hospital, discharge time will be determined by your treatment team.   Patients discharged the day of surgery will not be allowed to drive home, and someone needs to stay with them for 24 hours.  ONLY 1 SUPPORT PERSON MAY BE PRESENT WHILE YOU ARE IN SURGERY. IF YOU ARE TO BE ADMITTED ONCE YOU ARE IN YOUR ROOM YOU WILL BE ALLOWED TWO (2) VISITORS.  Minor children may have two parents present. Special consideration for safety and communication needs will be reviewed on a case by case basis.  Special instructions:    Oral Hygiene is also important to reduce your risk of infection.  Remember - BRUSH YOUR TEETH THE MORNING OF SURGERY WITH YOUR REGULAR TOOTHPASTE   [facility]- Preparing For Surgery  Before surgery, you can play an important role. Because skin is not sterile, your skin needs to be as free of germs as possible.  You can reduce the number of germs on your skin by washing with CHG (chlorahexidine gluconate) Soap before surgery.  CHG is an antiseptic cleaner which kills germs and bonds with the skin to continue killing germs even after washing.     Please do not use if you have an allergy to CHG or antibacterial soaps. If your skin becomes reddened/irritated stop using the CHG.  Do not shave (including legs and underarms) for at least 48 hours prior to first CHG shower. It is OK to shave your face.  Please follow these instructions carefully.     Shower the NIGHT BEFORE SURGERY  and the MORNING OF SURGERY with CHG Soap.   If you chose to wash your hair, wash your hair first as usual with your normal shampoo. After you shampoo, rinse your hair and body thoroughly to remove the shampoo.  Then [employer] and genitals (private parts) with your normal soap and rinse thoroughly to remove soap.  After that Use CHG Soap as you would any other liquid soap. You can apply CHG directly to the skin and wash gently with a scrungie or a clean washcloth.   Apply the CHG Soap to your body ONLY FROM THE NECK DOWN.  Do not use on open wounds or open sores. Avoid contact with your eyes, ears, mouth and genitals (private parts). Wash Face and genitals (private parts)  with your normal soap.   Wash thoroughly, paying special attention to the area where your surgery will be performed.  Thoroughly rinse your body with warm water from the neck down.  DO NOT shower/wash with your normal soap after using and rinsing off the CHG Soap.  Pat yourself dry with a CLEAN TOWEL.  Wear CLEAN PAJAMAS to bed the night before surgery  Place CLEAN SHEETS on your bed the night before your surgery  DO NOT SLEEP WITH PETS.   Day of Surgery:  Take a shower with CHG soap. Wear Clean/Comfortable clothing the morning of surgery Do not apply any deodorants/lotions.   Remember to brush your teeth WITH YOUR REGULAR TOOTHPASTE.   Please read over the following fact sheets that you were given.

## 2020-07-28 ENCOUNTER — Inpatient Hospital Stay: Admission: RE | Admit: 2020-07-28 | Payer: Managed Care, Other (non HMO) | Source: Ambulatory Visit

## 2020-07-28 ENCOUNTER — Encounter (HOSPITAL_COMMUNITY)
Admission: RE | Admit: 2020-07-28 | Discharge: 2020-07-28 | Disposition: A | Discharge status: Home / Self Care | Payer: Managed Care, Other (non HMO) | Source: Ambulatory Visit | Attending: General Surgery | Admitting: General Surgery

## 2020-07-28 ENCOUNTER — Other Ambulatory Visit: Payer: Self-pay

## 2020-07-28 ENCOUNTER — Encounter (HOSPITAL_COMMUNITY): Payer: Self-pay

## 2020-07-28 DIAGNOSIS — Z01812 Encounter for preprocedural laboratory examination: Secondary | ICD-10-CM | POA: Insufficient documentation

## 2020-07-28 HISTORY — DX: Anxiety disorder, unspecified: F41.9

## 2020-07-28 LAB — CBC
HCT: 37.5 % (ref 36.0–46.0)
Hemoglobin: 12.1 g/dL (ref 12.0–15.0)
MCH: 31.9 pg (ref 26.0–34.0)
MCHC: 32.3 g/dL (ref 30.0–36.0)
MCV: 98.9 fL (ref 80.0–100.0)
Platelets: 236 10*3/uL (ref 150–400)
RBC: 3.79 MIL/uL — ABNORMAL LOW (ref 3.87–5.11)
RDW: 14.4 % (ref 11.5–15.5)
WBC: 7.9 10*3/uL (ref 4.0–10.5)
nRBC: 0 % (ref 0.0–0.2)

## 2020-07-28 LAB — BASIC METABOLIC PANEL
Anion gap: 7 (ref 5–15)
BUN: 9 mg/dL (ref 8–23)
CO2: 24 mmol/L (ref 22–32)
Calcium: 9.6 mg/dL (ref 8.9–10.3)
Chloride: 106 mmol/L (ref 98–111)
Creatinine, Ser: 0.72 mg/dL (ref 0.44–1.00)
GFR, Estimated: >60 mL/min (ref >60)
Glucose, Bld: 166 mg/dL — ABNORMAL HIGH (ref 70–99)
Potassium: 4.2 mmol/L (ref 3.5–5.1)
Sodium: 137 mmol/L (ref 135–145)

## 2020-07-28 NOTE — Progress Notes (Signed)
PCP - [name], MD Cardiologist - denies  PPM/ICD - denies Device Orders - N/A Rep Notified - N/A  Chest x-ray - N/A EKG - N/A Stress Test - denies ECHO - 06/29/2020 Cardiac Cath - denies  Sleep Study - denies CPAP - N/A  Fasting Blood Sugar - N/A  Blood Thinner Instructions: N/A  Aspirin Instructions: Patient was instructed: As of today, STOP taking any Aspirin (unless otherwise instructed by your surgeon) Aleve, Naproxen, Ibuprofen, Motrin, Advil, Goody's, BC's, all herbal medications, fish oil, and all vitamins.  ERAS Protcol - yes PRE-SURGERY Ensure - yes  COVID TEST- N/A - ambulatory surgery   Anesthesia review: yes; current chemotherapy; anesthesia complications (nausea/vomiting).  Patient denies shortness of breath, fever, cough and chest pain at PAT appointment   All instructions explained to the patient, with a verbal understanding of the material. Patient agrees to go over the instructions while at home for a better understanding. Patient also instructed to self quarantine after being tested for COVID-19. The opportunity to ask questions was provided.

## 2020-08-04 ENCOUNTER — Encounter (HOSPITAL_COMMUNITY)
Admission: RE | Disposition: A | Discharge status: Home / Self Care | Payer: Self-pay | Source: Home / Self Care | Attending: General Surgery

## 2020-08-04 ENCOUNTER — Other Ambulatory Visit: Payer: Self-pay

## 2020-08-04 ENCOUNTER — Ambulatory Visit (HOSPITAL_COMMUNITY): Payer: Managed Care, Other (non HMO) | Admitting: Physician Assistant

## 2020-08-04 ENCOUNTER — Observation Stay (HOSPITAL_COMMUNITY)
Admission: RE | Admit: 2020-08-04 | Discharge: 2020-08-05 | Disposition: A | Discharge status: Home / Self Care | Payer: Managed Care, Other (non HMO) | Attending: General Surgery | Admitting: General Surgery

## 2020-08-04 ENCOUNTER — Encounter (HOSPITAL_COMMUNITY): Payer: Self-pay | Admitting: General Surgery

## 2020-08-04 ENCOUNTER — Ambulatory Visit (HOSPITAL_COMMUNITY): Payer: Managed Care, Other (non HMO) | Admitting: Certified Registered Nurse Anesthetist

## 2020-08-04 DIAGNOSIS — F17210 Nicotine dependence, cigarettes, uncomplicated: Secondary | ICD-10-CM | POA: Diagnosis not present

## 2020-08-04 DIAGNOSIS — Z79899 Other long term (current) drug therapy: Secondary | ICD-10-CM | POA: Insufficient documentation

## 2020-08-04 DIAGNOSIS — C50911 Malignant neoplasm of unspecified site of right female breast: Secondary | ICD-10-CM | POA: Diagnosis not present

## 2020-08-04 DIAGNOSIS — Z9011 Acquired absence of right breast and nipple: Secondary | ICD-10-CM

## 2020-08-04 HISTORY — DX: Malignant neoplasm of unspecified site of unspecified female breast: C50.919

## 2020-08-04 HISTORY — PX: MASTECTOMY WITH AXILLARY LYMPH NODE DISSECTION: SHX5661

## 2020-08-04 SURGERY — MASTECTOMY WITH AXILLARY LYMPH NODE DISSECTION
Anesthesia: General | Site: Breast | Laterality: Right

## 2020-08-04 MED ORDER — EPHEDRINE SULFATE 50 MG/ML IJ SOLN: INTRAMUSCULAR | Status: DC | PRN

## 2020-08-04 MED ORDER — CHLORHEXIDINE GLUCONATE 0.12 % MT SOLN
15.0000 mL | Freq: Once | OROMUCOSAL | Status: AC
  Administered 2020-08-04: 15 mL via OROMUCOSAL
  Filled 2020-08-04: qty 15

## 2020-08-04 MED ORDER — NICOTINE 14 MG/24HR TD PT24
14.0000 mg | MEDICATED_PATCH | Freq: Every day | TRANSDERMAL | Status: DC
  Administered 2020-08-04 – 2020-08-05 (×2): 14 mg via TRANSDERMAL
  Filled 2020-08-04 (×2): qty 1

## 2020-08-04 MED ORDER — FENTANYL CITRATE (PF) 250 MCG/5ML IJ SOLN
INTRAMUSCULAR | Status: DC | PRN
  Administered 2020-08-04: 25 ug via INTRAVENOUS
  Administered 2020-08-04 (×2): 50 ug via INTRAVENOUS
  Administered 2020-08-04: 25 ug via INTRAVENOUS

## 2020-08-04 MED ORDER — ONDANSETRON HCL 4 MG/2ML IJ SOLN
INTRAMUSCULAR | Status: AC
  Filled 2020-08-04: qty 2

## 2020-08-04 MED ORDER — PHENYLEPHRINE 40 MCG/ML (10ML) SYRINGE FOR IV PUSH (FOR BLOOD PRESSURE SUPPORT)
PREFILLED_SYRINGE | INTRAVENOUS | Status: AC
  Filled 2020-08-04: qty 10

## 2020-08-04 MED ORDER — ENSURE PRE-SURGERY PO LIQD: 296.0000 mL | Freq: Once | ORAL | Status: DC

## 2020-08-04 MED ORDER — PANTOPRAZOLE SODIUM 40 MG PO TBEC
40.0000 mg | DELAYED_RELEASE_TABLET | Freq: Every day | ORAL | Status: DC
  Administered 2020-08-04 – 2020-08-05 (×2): 40 mg via ORAL
  Filled 2020-08-04 (×2): qty 1

## 2020-08-04 MED ORDER — EPHEDRINE 5 MG/ML INJ
INTRAVENOUS | Status: AC
  Filled 2020-08-04: qty 5

## 2020-08-04 MED ORDER — PHENYLEPHRINE 40 MCG/ML (10ML) SYRINGE FOR IV PUSH (FOR BLOOD PRESSURE SUPPORT)
PREFILLED_SYRINGE | INTRAVENOUS | Status: DC | PRN
  Administered 2020-08-04 (×5): 80 ug via INTRAVENOUS

## 2020-08-04 MED ORDER — DEXAMETHASONE SODIUM PHOSPHATE 10 MG/ML IJ SOLN
INTRAMUSCULAR | Status: AC
  Filled 2020-08-04: qty 1

## 2020-08-04 MED ORDER — ACETAMINOPHEN 500 MG PO TABS
1000.0000 mg | ORAL_TABLET | ORAL | Status: AC
  Administered 2020-08-04: 1000 mg via ORAL
  Filled 2020-08-04: qty 2

## 2020-08-04 MED ORDER — MIDAZOLAM HCL 2 MG/2ML IJ SOLN
INTRAMUSCULAR | Status: AC
  Administered 2020-08-04: 2 mg via INTRAVENOUS
  Filled 2020-08-04: qty 2

## 2020-08-04 MED ORDER — CHLORHEXIDINE GLUCONATE CLOTH 2 % EX PADS: 6.0000 | MEDICATED_PAD | Freq: Once | CUTANEOUS | Status: DC

## 2020-08-04 MED ORDER — OXYCODONE HCL 5 MG PO TABS: 5.0000 mg | ORAL_TABLET | ORAL | Status: DC | PRN

## 2020-08-04 MED ORDER — LORATADINE 10 MG PO TABS: 10.0000 mg | ORAL_TABLET | Freq: Every day | ORAL | Status: DC | PRN

## 2020-08-04 MED ORDER — ALPRAZOLAM 0.25 MG PO TABS: 0.2500 mg | ORAL_TABLET | Freq: Two times a day (BID) | ORAL | Status: DC | PRN

## 2020-08-04 MED ORDER — HYDROMORPHONE HCL 1 MG/ML IJ SOLN
INTRAMUSCULAR | Status: AC
  Administered 2020-08-04: 0.25 mg via INTRAVENOUS
  Filled 2020-08-04: qty 1

## 2020-08-04 MED ORDER — ONDANSETRON HCL 4 MG/2ML IJ SOLN
INTRAMUSCULAR | Status: DC | PRN
  Administered 2020-08-04: 4 mg via INTRAVENOUS

## 2020-08-04 MED ORDER — SIMETHICONE 80 MG PO CHEW: 40.0000 mg | CHEWABLE_TABLET | Freq: Four times a day (QID) | ORAL | Status: DC | PRN

## 2020-08-04 MED ORDER — PROPOFOL 10 MG/ML IV BOLUS
INTRAVENOUS | Status: AC
  Filled 2020-08-04: qty 20

## 2020-08-04 MED ORDER — PHENYLEPHRINE HCL (PRESSORS) 10 MG/ML IV SOLN
INTRAVENOUS | Status: AC
  Filled 2020-08-04: qty 1

## 2020-08-04 MED ORDER — OXYCODONE HCL 5 MG PO TABS: 5.0000 mg | ORAL_TABLET | Freq: Once | ORAL | Status: AC | PRN

## 2020-08-04 MED ORDER — SODIUM CHLORIDE 0.9 % IV SOLN: INTRAVENOUS | Status: DC

## 2020-08-04 MED ORDER — ONDANSETRON HCL 4 MG/2ML IJ SOLN: 4.0000 mg | Freq: Four times a day (QID) | INTRAMUSCULAR | Status: DC | PRN

## 2020-08-04 MED ORDER — FENTANYL CITRATE (PF) 100 MCG/2ML IJ SOLN
INTRAMUSCULAR | Status: AC
  Administered 2020-08-04: 100 ug via INTRAVENOUS
  Filled 2020-08-04: qty 2

## 2020-08-04 MED ORDER — MEPERIDINE HCL 25 MG/ML IJ SOLN: 6.2500 mg | INTRAMUSCULAR | Status: DC | PRN

## 2020-08-04 MED ORDER — MORPHINE SULFATE (PF) 2 MG/ML IV SOLN: 1.0000 mg | INTRAVENOUS | Status: DC | PRN

## 2020-08-04 MED ORDER — FENTANYL CITRATE (PF) 250 MCG/5ML IJ SOLN
INTRAMUSCULAR | Status: AC
  Filled 2020-08-04: qty 5

## 2020-08-04 MED ORDER — LIDOCAINE 2% (20 MG/ML) 5 ML SYRINGE
INTRAMUSCULAR | Status: AC
  Filled 2020-08-04: qty 5

## 2020-08-04 MED ORDER — SCOPOLAMINE 1 MG/3DAYS TD PT72
1.0000 | MEDICATED_PATCH | TRANSDERMAL | Status: DC
  Administered 2020-08-04: 1.5 mg via TRANSDERMAL
  Filled 2020-08-04: qty 1

## 2020-08-04 MED ORDER — LACTATED RINGERS IV SOLN: INTRAVENOUS | Status: DC

## 2020-08-04 MED ORDER — OXYCODONE HCL 5 MG PO TABS
ORAL_TABLET | ORAL | Status: AC
  Administered 2020-08-04: 5 mg via ORAL
  Filled 2020-08-04: qty 1

## 2020-08-04 MED ORDER — GABAPENTIN 300 MG PO CAPS
300.0000 mg | ORAL_CAPSULE | Freq: Three times a day (TID) | ORAL | Status: DC
  Administered 2020-08-04 – 2020-08-05 (×3): 300 mg via ORAL
  Filled 2020-08-04 (×3): qty 1

## 2020-08-04 MED ORDER — PHENYLEPHRINE HCL-NACL 20-0.9 MG/250ML-% IV SOLN
INTRAVENOUS | Status: DC | PRN
  Administered 2020-08-04: 25 ug/min via INTRAVENOUS

## 2020-08-04 MED ORDER — ORAL CARE MOUTH RINSE: 15.0000 mL | Freq: Once | OROMUCOSAL | Status: AC

## 2020-08-04 MED ORDER — CEFAZOLIN SODIUM-DEXTROSE 2-4 GM/100ML-% IV SOLN
2.0000 g | INTRAVENOUS | Status: AC
  Administered 2020-08-04: 2 g via INTRAVENOUS
  Filled 2020-08-04: qty 100

## 2020-08-04 MED ORDER — FENTANYL CITRATE (PF) 100 MCG/2ML IJ SOLN: 100.0000 ug | Freq: Once | INTRAMUSCULAR | Status: AC

## 2020-08-04 MED ORDER — ACETAMINOPHEN 500 MG PO TABS
1000.0000 mg | ORAL_TABLET | Freq: Four times a day (QID) | ORAL | Status: DC
  Administered 2020-08-04 – 2020-08-05 (×3): 1000 mg via ORAL
  Filled 2020-08-04 (×3): qty 2

## 2020-08-04 MED ORDER — METHOCARBAMOL 500 MG PO TABS: 500.0000 mg | ORAL_TABLET | Freq: Four times a day (QID) | ORAL | Status: DC | PRN

## 2020-08-04 MED ORDER — 0.9 % SODIUM CHLORIDE (POUR BTL) OPTIME
TOPICAL | Status: DC | PRN
  Administered 2020-08-04: 1000 mL

## 2020-08-04 MED ORDER — HEMOSTATIC AGENTS (NO CHARGE) OPTIME
TOPICAL | Status: DC | PRN
  Administered 2020-08-04: 4

## 2020-08-04 MED ORDER — MIDAZOLAM HCL 2 MG/2ML IJ SOLN: 0.5000 mg | Freq: Once | INTRAMUSCULAR | Status: DC | PRN

## 2020-08-04 MED ORDER — MIDAZOLAM HCL 2 MG/2ML IJ SOLN: 2.0000 mg | Freq: Once | INTRAMUSCULAR | Status: AC

## 2020-08-04 MED ORDER — PROPOFOL 10 MG/ML IV BOLUS
INTRAVENOUS | Status: DC | PRN
  Administered 2020-08-04: 200 mg via INTRAVENOUS

## 2020-08-04 MED ORDER — ONDANSETRON 4 MG PO TBDP: 4.0000 mg | ORAL_TABLET | Freq: Four times a day (QID) | ORAL | Status: DC | PRN

## 2020-08-04 MED ORDER — EPHEDRINE SULFATE-NACL 50-0.9 MG/10ML-% IV SOSY
PREFILLED_SYRINGE | INTRAVENOUS | Status: DC | PRN
  Administered 2020-08-04: 5 mg via INTRAVENOUS

## 2020-08-04 MED ORDER — BUPIVACAINE-EPINEPHRINE (PF) 0.5% -1:200000 IJ SOLN
INTRAMUSCULAR | Status: DC | PRN
  Administered 2020-08-04: 30 mL

## 2020-08-04 MED ORDER — DEXAMETHASONE SODIUM PHOSPHATE 10 MG/ML IJ SOLN
INTRAMUSCULAR | Status: DC | PRN
  Administered 2020-08-04: 5 mg via INTRAVENOUS

## 2020-08-04 MED ORDER — OXYCODONE HCL 5 MG/5ML PO SOLN: 5.0000 mg | Freq: Once | ORAL | Status: AC | PRN

## 2020-08-04 MED ORDER — HYDROMORPHONE HCL 1 MG/ML IJ SOLN
0.2500 mg | INTRAMUSCULAR | Status: DC | PRN
  Administered 2020-08-04: 0.25 mg via INTRAVENOUS

## 2020-08-04 MED ORDER — PROMETHAZINE HCL 25 MG/ML IJ SOLN: 6.2500 mg | INTRAMUSCULAR | Status: DC | PRN

## 2020-08-04 SURGICAL SUPPLY — 67 items
ADH SKN CLS APL DERMABOND .7 (GAUZE/BANDAGES/DRESSINGS) ×2
APL PRP STRL LF DISP 70% ISPRP (MISCELLANEOUS) ×1
APPLIER CLIP 13 LRG OPEN (CLIP) ×2
APPLIER CLIP 9.375 MED OPEN (MISCELLANEOUS) ×6
APR CLP LRG 13 20 CLIP (CLIP) ×1
APR CLP MED 9.3 20 MLT OPN (MISCELLANEOUS) ×3
BAG COUNTER SPONGE SURGICOUNT (BAG) ×2 IMPLANT
BAG SPNG CNTER NS LX DISP (BAG) ×1
BINDER BREAST LRG (GAUZE/BANDAGES/DRESSINGS) IMPLANT
BINDER BREAST XLRG (GAUZE/BANDAGES/DRESSINGS) IMPLANT
BINDER BREAST XXLRG (GAUZE/BANDAGES/DRESSINGS) ×1 IMPLANT
BIOPATCH RED 1 DISK 7.0 (GAUZE/BANDAGES/DRESSINGS) ×2 IMPLANT
CANISTER SUCT 3000ML PPV (MISCELLANEOUS) ×2 IMPLANT
CHLORAPREP W/TINT 26 (MISCELLANEOUS) ×2 IMPLANT
CLIP APPLIE 13 LRG OPEN (CLIP) IMPLANT
CLIP APPLIE 9.375 MED OPEN (MISCELLANEOUS) IMPLANT
CLSR STERI-STRIP ANTIMIC 1/2X4 (GAUZE/BANDAGES/DRESSINGS) ×2 IMPLANT
CNTNR URN SCR LID CUP LEK RST (MISCELLANEOUS) ×1 IMPLANT
CONT SPEC 4OZ STRL OR WHT (MISCELLANEOUS) ×2
COVER PROBE W GEL 5X96 (DRAPES) ×1 IMPLANT
COVER SURGICAL LIGHT HANDLE (MISCELLANEOUS) ×2 IMPLANT
DERMABOND ADVANCED (GAUZE/BANDAGES/DRESSINGS) ×2
DERMABOND ADVANCED .7 DNX12 (GAUZE/BANDAGES/DRESSINGS) ×1 IMPLANT
DRAIN CHANNEL 19F RND (DRAIN) ×2 IMPLANT
DRAPE LAPAROSCOPIC ABDOMINAL (DRAPES) ×2 IMPLANT
DRSG PAD ABDOMINAL 8X10 ST (GAUZE/BANDAGES/DRESSINGS) ×2 IMPLANT
DRSG TEGADERM 4X4.75 (GAUZE/BANDAGES/DRESSINGS) ×2 IMPLANT
ELECT BLADE 4.0 EZ CLEAN MEGAD (MISCELLANEOUS) ×2
ELECT CAUTERY BLADE 6.4 (BLADE) ×2 IMPLANT
ELECT REM PT RETURN 9FT ADLT (ELECTROSURGICAL) ×2
ELECTRODE BLDE 4.0 EZ CLN MEGD (MISCELLANEOUS) ×1 IMPLANT
ELECTRODE REM PT RTRN 9FT ADLT (ELECTROSURGICAL) ×1 IMPLANT
EVACUATOR SILICONE 100CC (DRAIN) ×3 IMPLANT
GAUZE SPONGE 4X4 12PLY STRL (GAUZE/BANDAGES/DRESSINGS) ×1 IMPLANT
GLOVE SURG ENC MOIS LTX SZ7 (GLOVE) ×2 IMPLANT
GLOVE SURG UNDER POLY LF SZ7.5 (GLOVE) ×2 IMPLANT
GOWN STRL REUS W/ TWL LRG LVL3 (GOWN DISPOSABLE) ×3 IMPLANT
GOWN STRL REUS W/TWL LRG LVL3 (GOWN DISPOSABLE) ×6
HEMOSTAT ARISTA ABSORB 3G PWDR (HEMOSTASIS) ×4 IMPLANT
KIT BASIN OR (CUSTOM PROCEDURE TRAY) ×2 IMPLANT
KIT TURNOVER KIT B (KITS) ×2 IMPLANT
MARKER SKIN DUAL TIP RULER LAB (MISCELLANEOUS) ×2 IMPLANT
NDL 18GX1X1/2 (RX/OR ONLY) (NEEDLE) IMPLANT
NDL FILTER BLUNT 18X1 1/2 (NEEDLE) IMPLANT
NDL HYPO 25GX1X1/2 BEV (NEEDLE) IMPLANT
NEEDLE 18GX1X1/2 (RX/OR ONLY) (NEEDLE) IMPLANT
NEEDLE FILTER BLUNT 18X 1/2SAF (NEEDLE)
NEEDLE FILTER BLUNT 18X1 1/2 (NEEDLE) IMPLANT
NEEDLE HYPO 25GX1X1/2 BEV (NEEDLE) IMPLANT
NS IRRIG 1000ML POUR BTL (IV SOLUTION) ×2 IMPLANT
PACK GENERAL/GYN (CUSTOM PROCEDURE TRAY) ×2 IMPLANT
PAD ARMBOARD 7.5X6 YLW CONV (MISCELLANEOUS) ×2 IMPLANT
PENCIL SMOKE EVACUATOR (MISCELLANEOUS) ×2 IMPLANT
PIN SAFETY STERILE (MISCELLANEOUS) ×2 IMPLANT
SPECIMEN JAR X LARGE (MISCELLANEOUS) ×2 IMPLANT
STAPLER VISISTAT 35W (STAPLE) ×2 IMPLANT
STRIP CLOSURE SKIN 1/2X4 (GAUZE/BANDAGES/DRESSINGS) ×1 IMPLANT
SUT ETHILON 2 0 FS 18 (SUTURE) ×3 IMPLANT
SUT MNCRL AB 4-0 PS2 18 (SUTURE) ×2 IMPLANT
SUT SILK 2 0 SH (SUTURE) IMPLANT
SUT VIC AB 3-0 54X BRD REEL (SUTURE) ×1 IMPLANT
SUT VIC AB 3-0 BRD 54 (SUTURE) ×2
SUT VIC AB 3-0 SH 18 (SUTURE) ×3 IMPLANT
SUT VIC AB 3-0 SH 8-18 (SUTURE) ×1 IMPLANT
SYR CONTROL 10ML LL (SYRINGE) IMPLANT
TOWEL GREEN STERILE (TOWEL DISPOSABLE) ×2 IMPLANT
TOWEL GREEN STERILE FF (TOWEL DISPOSABLE) ×2 IMPLANT

## 2020-08-04 NOTE — Interval H&P Note (Signed)
History and Physical Interval Note:  08/04/2020 10:07 AM  Sabrina Woods  has presented today for surgery, with the diagnosis of RIGHT BREAST CANCER.  The various methods of treatment have been discussed with the patient and family. After consideration of risks, benefits and other options for treatment, the patient has consented to  Procedure(s): RIGHT MASTECTOMY WITH RIGHT AXILLARY LYMPH NODE DISSECTION (Right) as a surgical intervention.  The patient's history has been reviewed, patient examined, no change in status, stable for surgery.  I have reviewed the patient's chart and labs.  Questions were answered to the patient's satisfaction.     Rolm Bookbinder

## 2020-08-04 NOTE — H&P (Signed)
Sabrina Woods is an 65 y.o. female.   Chief Complaint: breast cancer HPI:  37 yof smoker who has no prior breast history.  she has family history of breast cancer in her mom in her 5s.  she has noted a right breast mass for one year.  she has gotten a mm in February that shows b density breasts.  she was noted to have right breast skin thickening and a dense mass throughout the uoq and loq of the right breast.  there is also a low axillary mass.  US shows at least a 11x5.9x6.3 cm mass that extends to the skin at 10 oclock.  in the axilla there are two adjacent masses measuring 2.6x2.4cm and 2.5x2.6 cm.  In the left breast at 330 there is a small group of cysts, there is another group of cysts near that, some ax nodes with fct and a retroareolar mass measuring 3 cm greatest dimension.  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 the right ax node is IDC.  the mass is er pos (5%), pr neg, her 2 pos, Ki 25% IDC that is grade III.  she is currently being treated with primary chemotherapy she is tolerating fairly well and is done end of june. she had repeat mri 6/20 that shows necrotic mass in uoq measuring 9 cm (prior 10.5), smaller satellite mass is 1.7 cm from 2.2.  has enlarged right axillary nodes.     Past Medical History:  Diagnosis Date   Acid reflux    Anxiety    Depression    HISTORY OF   Fibroid    PONV (postoperative nausea and vomiting)    Smoker     Past Surgical History:  Procedure Laterality Date   CESAREAN SECTION     X 2   PORTACATH PLACEMENT Left 03/17/2020   Procedure: INSERTION PORT-A-CATH;  Surgeon: Rolm Bookbinder, MD;  Location: [facility];  Service: General;  Laterality: Left;    Family History  Problem Relation Age of Onset   Breast cancer Mother    Diabetes Mother    Hypertension Mother    Hypertension Father    Hypertension Sister    Hypertension Brother    Social History:  reports that she has been  smoking cigarettes. She has been smoking an average of .5 packs per day. She has never used smokeless tobacco. She reports current alcohol use. No history on file for drug use.  Allergies: No Known Allergies  No medications prior to admission.    No results found for this or any previous visit (from the past 48 hour(s)). No results found.  Review of Systems  All other systems reviewed and are negative.  There were no vitals taken for this visit. Physical Exam  General Mental Status - Alert. Orientation - Oriented X3. Breast Nipples - No Discharge. Note:  large ruoq mass measuring at least 10 cm, it appears mobile now, open wound Lymphatic Note:  no Chester adenopathy no cervical adenopathy right axillary mobile adenopathy Cv regular Pulm effort normal  Assessment/Plan BREAST CANCER METASTASIZED TO AXILLARY LYMPH NODE, RIGHT (C50.911) Right MRM I dont think any options but mrm in this case. It will at least be easier than my first exam. discussed mrm with removing entire breast, nac and most of axillary nodes. discussed overnight stay, drains, risks and recovery. additional tx per oncology based on pathology but will need radiotherapy.   Rolm Bookbinder, MD 08/04/2020,  6:58 AM

## 2020-08-04 NOTE — Anesthesia Preprocedure Evaluation (Addendum)
Anesthesia Evaluation  Patient identified by MRN, date of birth, ID band Patient awake    Reviewed: Allergy & Precautions, NPO status , Patient's Chart, lab work & pertinent test results  History of Anesthesia Complications (+) PONV  Airway Mallampati: II  TM Distance: >3 FB Neck ROM: Full    Dental  (+) Dental Advisory Given   Pulmonary Current SmokerPatient did not abstain from smoking.,    breath sounds clear to auscultation       Cardiovascular  Rhythm:Regular Rate:Normal  06/2020 ECHO: EF 65-70%, normal LVF, mild LVH, grade 1 DD, no significant valvular abnormalities   Neuro/Psych Anxiety Depression    GI/Hepatic Neg liver ROS, GERD  Medicated and Controlled,  Endo/Other  Morbid obesity  Renal/GU negative Renal ROS     Musculoskeletal   Abdominal (+) + obese,   Peds  Hematology negative hematology ROS (+)   Anesthesia Other Findings Breast cancer  Reproductive/Obstetrics                            Anesthesia Physical Anesthesia Plan  ASA: 3  Anesthesia Plan: General   Post-op Pain Management: GA combined w/ Regional for post-op pain   Induction: Intravenous  PONV Risk Score and Plan: 3 and Ondansetron, Dexamethasone and Scopolamine patch - Pre-op  Airway Management Planned: LMA  Additional Equipment: None  Intra-op Plan:   Post-operative Plan:   Informed Consent: I have reviewed the patients History and Physical, chart, labs and discussed the procedure including the risks, benefits and alternatives for the proposed anesthesia with the patient or authorized representative who has indicated his/her understanding and acceptance.     Dental advisory given  Plan Discussed with: CRNA and Surgeon  Anesthesia Plan Comments: (Plan routine monitors, GA-LMA OK, pectoralis block for post op analgesia)       Anesthesia Quick Evaluation

## 2020-08-04 NOTE — Anesthesia Procedure Notes (Addendum)
  Anesthesia Regional Block: Pectoralis block   Pre-Anesthetic Checklist: , timeout performed,  Correct Patient, Correct Site, Correct Laterality,  Correct Procedure, Correct Position, site marked,  Risks and benefits discussed,  Surgical consent,  Pre-op evaluation,  At surgeon's request and post-op pain management  Laterality: Right  Prep: chloraprep       Needles:  Injection technique: Single-shot  Needle Type: Echogenic Needle     Needle Length: 9cm  Needle Gauge: 21     Additional Needles:   Procedures:,,,, ultrasound used (permanent image in chart),,    Narrative:  Start time: 08/04/2020 9:30 AM End time: 08/04/2020 9:41 AM Injection made incrementally with aspirations every 5 mL.  Performed by: Personally  Anesthesiologist: Annye Asa, MD  Additional Notes: Pt identified in Holding room.  Monitors applied. Working IV access confirmed. Sterile prep R clavicle and pec.  #21ga ECHOgenic Arrow block needle between pec minor and serratus, between ribs 4,5 with US guidance.  30cc 0.5% Bupivacaine with 1:200k epi injected incrementally after negative test dose.  Patient asymptomatic, VSS, no heme aspirated, tolerated well.   Jenita Seashore, MD

## 2020-08-04 NOTE — Anesthesia Postprocedure Evaluation (Signed)
Anesthesia Post Note  Patient: Sabrina Woods  Procedure(s) Performed: RIGHT MASTECTOMY WITH RIGHT AXILLARY LYMPH NODE DISSECTION (Right: Breast)     Patient location during evaluation: PACU Anesthesia Type: General Level of consciousness: awake and alert, patient cooperative and oriented Pain management: pain level controlled Vital Signs Assessment: post-procedure vital signs reviewed and stable Respiratory status: spontaneous breathing, nonlabored ventilation and respiratory function stable Cardiovascular status: blood pressure returned to baseline and stable Postop Assessment: no apparent nausea or vomiting Anesthetic complications: no   No notable events documented.  Last Vitals:  Vitals:   08/04/20 1323 08/04/20 1338  BP: 122/60 126/66  Pulse: 76 81  Resp: 16 13  Temp: 36.8 C 36.8 C  SpO2: 94% 95%    Last Pain:  Vitals:   08/04/20 1338  TempSrc:   PainSc: 5                  Okla Qazi,E. Tavish Gettis

## 2020-08-04 NOTE — Discharge Instructions (Addendum)
[facility], [place] [phone]  MASTECTOMY: POST OP INSTRUCTIONS Take 400 mg of ibuprofen every 8 hours or 650 mg tylenol every 6 hours for next 72 hours then as needed. Use ice several times daily also. Always review your discharge instruction sheet given to you by the facility where your surgery was performed. IF YOU HAVE DISABILITY OR FAMILY LEAVE FORMS, YOU MUST BRING THEM TO THE OFFICE FOR PROCESSING.   DO NOT GIVE THEM TO YOUR DOCTOR. A prescription for pain medication may be given to you upon discharge.  Take your pain medication as prescribed, if needed.  If narcotic pain medicine is not needed, then you may take acetaminophen (Tylenol), naprosyn (Alleve) or ibuprofen (Advil) as needed. Take your usually prescribed medications unless otherwise directed. If you need a refill on your pain medication, please contact your pharmacy.  They will contact our office to request authorization.  Prescriptions will not be filled after 5pm or on week-ends. You should follow a light diet the first few days after arrival home, such as soup and crackers, etc.  Resume your normal diet the day after surgery. Most patients will experience some swelling and bruising on the chest and underarm.  Ice packs will help.  Swelling and bruising can take several days to resolve. Wear the binder day and night until you return to the office.  It is common to experience some constipation if taking pain medication after surgery.  Increasing fluid intake and taking a stool softener (such as Colace) will usually help or prevent this problem from occurring.  A mild laxative (Milk of Magnesia or Miralax) should be taken according to package instructions if there are no bowel movements after 48 hours. You may shower as discussed starting tomorrow. You may have steri-strips (small skin tapes) in place directly over the incision.  These strips should be left on the skin until they are falling off.  If you have glue it  will come off in next couple week.  Any sutures will be removed at an office visit DRAINS:  If you have drains in place, it is important to keep a list of the amount of drainage produced each day in your drains.  Before leaving the hospital, you should be instructed on drain care.  Call our office if you have any questions about your drains. I will remove your drains when they put out less than 30 cc or ml for 2 consecutive days. ACTIVITIES:  You may resume regular (light) daily activities beginning the next day--such as daily self-care, walking, climbing stairs--gradually increasing activities as tolerated.  You may have sexual intercourse when it is comfortable.  Refrain from any heavy lifting or straining until approved by your doctor. You may drive when you are no longer taking prescription pain medication, you can comfortably wear a seatbelt, and you can safely maneuver your car and apply brakes. RETURN TO WORK:  __________________________________________________________ Dennis Bast should see your doctor in the office for a follow-up appointment approximately 3-5 days after your surgery.  Your doctor's nurse will typically make your follow-up appointment when she calls you with your pathology report.  Expect your pathology report 3-4business days after surgery. OTHER INSTRUCTIONS: ______________________________________________________________________________________________ ____________________________________________________________________________________________ WHEN TO CALL YOUR DR Nichola Warren: Fever over 101.0 Nausea and/or vomiting Extreme swelling or bruising Continued bleeding from incision. Increased pain, redness, or drainage from the incision. The clinic staff is available to answer your questions during regular business hours.  Please don't hesitate to call and ask to speak to  one of the nurses for clinical concerns.  If you have a medical emergency, go to the nearest emergency room or call 911.   A surgeon from [facility] is always on call at the hospital. [address], [place], [place], [place]  [address] ? [address], [place], [place]   [address] [phone] ? [phone] ? FAX [phone] Web site: [email or web address]

## 2020-08-04 NOTE — Anesthesia Procedure Notes (Signed)
Procedure Name: LMA Insertion Date/Time: 08/04/2020 10:20 AM Performed by: Harden Mo, CRNA Pre-anesthesia Checklist: Patient identified, Emergency Drugs available, Suction available and Patient being monitored Patient Re-evaluated:Patient Re-evaluated prior to induction Oxygen Delivery Method: Circle System Utilized Preoxygenation: Pre-oxygenation with 100% oxygen Induction Type: IV induction LMA: LMA inserted LMA Size: 4.0 Number of attempts: 1 Airway Equipment and Method: Bite block Placement Confirmation: positive ETCO2 Tube secured with: Tape Dental Injury: Teeth and Oropharynx as per pre-operative assessment

## 2020-08-04 NOTE — Op Note (Signed)
Preoperative diagnosis: Stage III breast cancer status post primary chemotherapy Postoperative diagnosis: Same as above Procedure: Right modified radical mastectomy Surgeon: Dr. Serita Grammes Anesthesia: General with a pectoral block Estimated blood loss: [id number] cc Complications: None Drains: 2 19 French Blake drains Specimens: 1.  Right breast marked with short stitch short superior, long lateral 2.  Right axillary contents Sponge and count was correct at completion Decision to recovery stable condition  Indications:65 yof smoker who has no prior breast history.  she has family history of breast cancer in her mom in her 45s.  she has noted a right breast mass for one year.  she has gotten a mm in February that shows b density breasts.  she was noted to have right breast skin thickening and a dense mass throughout the uoq and loq of the right breast.  there is also a low axillary mass.  US shows at least a 11x5.9x6.3 cm mass that extends to the skin at 10 oclock.  in the axilla there are two adjacent masses measuring 2.6x2.4cm and 2.5x2.6 cm.  In the left breast at 330 there is a small group of cysts, there is another group of cysts near that, some ax nodes with fct and a retroareolar mass measuring 3 cm greatest dimension.  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 the right ax node is IDC.  the mass is er pos (5%), pr neg, her 2 pos, Ki 25% IDC that is grade III.   she had repeat mri 6/20 that shows necrotic mass in uoq measuring 9 cm (prior 10.5), smaller satellite mass is 1.7 cm from 2.2.  has enlarged right axillary nodes.  We discussed options and elected to proceed with a right modified radical mastectomy.  Procedure: After informed consent was obtained the patient first underwent a pectoral block.  She was given antibiotics.  SCDs were in place.  She was placed under general anesthesia without complication.  She was prepped and draped in the standard sterile  surgical fashion.  A surgical timeout was then performed.  She had a very large tumor that was still eroding through the skin in her upper outer quadrant.  This was fixed to her pectoralis muscle.  She had significant peau to orange of her skin even after chemotherapy.  I made a very large elliptical incision to encompass all of the area around the tumor as well as much of the skin as I could possibly do it still closing this.  I then created flaps to the parasternal area, clavicle, inframammary fold, and latissimus laterally.  While I was rolling up the breast and the pectoralis fashion it became clear this was adherent to the pectoralis muscle.  I removed about a third of the pec muscle and a small portion of the pectoralis minor to get the tumor out.  Once I done this I entered into the axilla.  This is where most of the blood loss was was during the mastectomy portion.  I elected to amputate the mastectomy portion and passed this off the table.  I then entered into the axilla.  She had large bulky adenopathy that was really 1 large conglomeration of nodes.  This was adherent to her axillary vein.  I was able to identify the axillary vein and slowly peel the tumor off of the vein without injuring the vein.  I used multiple clips and sutures.  I was able to find the long thoracic nerve.  The thoracodorsal bundle was involved in the tumor and a portion of this had to be sacrificed.  The artery and the nerve still look like they were intact.  I then removed all these nodes.  Hemostasis was obtained.  I placed 219 [place] Blake drains and secured these with 2-0 nylon suture.  I then placed Arista throughout the cavity.  I then closed the dermis down and tacked this to the pectoralis muscle on the sidewall with 3-0 nylon suture.  The skin was closed with 4-0 Monocryl and glue.  She tolerated this well was extubated and transferred recovery stable.

## 2020-08-04 NOTE — Transfer of Care (Signed)
Immediate Anesthesia Transfer of Care Note  Patient: Sabrina Woods  Procedure(s) Performed: RIGHT MASTECTOMY WITH RIGHT AXILLARY LYMPH NODE DISSECTION (Right: Breast)  Patient Location: PACU  Anesthesia Type:General and Regional  Level of Consciousness: awake and drowsy  Airway & Oxygen Therapy: Patient Spontanous Breathing  Post-op Assessment: Report given to RN, Post -op Vital signs reviewed and stable and Patient moving all extremities X 4  Post vital signs: Reviewed and stable  Last Vitals:  Vitals Value Taken Time  BP 129/109 08/04/20 1252  Temp    Pulse 96 08/04/20 1252  Resp 19 08/04/20 1252  SpO2 96 % 08/04/20 1252  Vitals shown include unvalidated device data.  Last Pain:  Vitals:   08/04/20 0832  TempSrc:   PainSc: 0-No pain         Complications: No notable events documented.

## 2020-08-05 ENCOUNTER — Encounter (HOSPITAL_COMMUNITY): Payer: Self-pay | Admitting: General Surgery

## 2020-08-05 DIAGNOSIS — C50911 Malignant neoplasm of unspecified site of right female breast: Secondary | ICD-10-CM | POA: Diagnosis not present

## 2020-08-05 LAB — BASIC METABOLIC PANEL
Anion gap: 9 (ref 5–15)
BUN: 11 mg/dL (ref 8–23)
CO2: 22 mmol/L (ref 22–32)
Calcium: 8.7 mg/dL — ABNORMAL LOW (ref 8.9–10.3)
Chloride: 106 mmol/L (ref 98–111)
Creatinine, Ser: 0.65 mg/dL (ref 0.44–1.00)
GFR, Estimated: >60 mL/min (ref >60)
Glucose, Bld: 215 mg/dL — ABNORMAL HIGH (ref 70–99)
Potassium: 3.8 mmol/L (ref 3.5–5.1)
Sodium: 137 mmol/L (ref 135–145)

## 2020-08-05 LAB — CBC
HCT: 28.5 % — ABNORMAL LOW (ref 36.0–46.0)
Hemoglobin: 9.4 g/dL — ABNORMAL LOW (ref 12.0–15.0)
MCH: 32.3 pg (ref 26.0–34.0)
MCHC: 33 g/dL (ref 30.0–36.0)
MCV: 97.9 fL (ref 80.0–100.0)
Platelets: 212 10*3/uL (ref 150–400)
RBC: 2.91 MIL/uL — ABNORMAL LOW (ref 3.87–5.11)
RDW: 14.1 % (ref 11.5–15.5)
WBC: 13 10*3/uL — ABNORMAL HIGH (ref 4.0–10.5)
nRBC: 0 % (ref 0.0–0.2)

## 2020-08-05 MED ORDER — OXYCODONE HCL 5 MG PO TABS: 5.0000 mg | ORAL_TABLET | ORAL | 0 refills | Status: DC | PRN

## 2020-08-05 MED ORDER — METHOCARBAMOL 500 MG PO TABS
500.0000 mg | ORAL_TABLET | Freq: Three times a day (TID) | ORAL | 0 refills | Status: DC | PRN
Start: 2020-08-05 — End: 2020-08-12

## 2020-08-05 NOTE — Discharge Summary (Signed)
Physician Discharge Summary  Patient ID: ASANA NAKAGAWA MRN: [id number] DOB/AGE: 1955/08/26 65 y.o.  Admit date: 08/04/2020 Discharge date: 08/05/2020  Admission Diagnoses: Right breast cancer s/p primary chemotherapy Anxiety Smoking history  Discharge Diagnoses:  S/p mrm  Discharged Condition: good  Hospital Course: 89 yof s/p primary systemic therapy for locally advanced breast cancer.  She underwent mrm.  She is doing well.  Drains as expected this am and she is doing well.  Hb lower today as expected postop, no concern for bleeding now examining her  Consults: None  Significant Diagnostic Studies: none  Treatments: surgery: right mrm  Discharge Exam: Blood pressure 129/65, pulse 75, temperature 98 F (36.7 C), temperature source Oral, resp. rate 17, height '5\' 6"'$  (1.676 m), weight 91.6 kg, SpO2 95 %. Drains serosang, flaps viable with no hematoma  Disposition: Discharge disposition: 01-Home or Self Care        Allergies as of 08/05/2020   No Known Allergies      Medication List     TAKE these medications    ALPRAZolam 0.25 MG tablet Commonly known as: XANAX Take 1 tablet (0.25 mg total) by mouth 2 (two) times daily as needed for anxiety.   esomeprazole 20 MG capsule Commonly known as: NEXIUM Take 20 mg by mouth daily.   gabapentin 300 MG capsule Commonly known as: NEURONTIN Take 1 capsule (300 mg total) by mouth 3 (three) times daily.   loperamide 2 MG capsule Commonly known as: IMODIUM Take by mouth as needed for diarrhea or loose stools.   loratadine 10 MG tablet Commonly known as: CLARITIN Take 10 mg by mouth daily as needed for rhinitis.   methocarbamol 500 MG tablet Commonly known as: ROBAXIN Take 1 tablet (500 mg total) by mouth every 8 (eight) hours as needed for muscle spasms.   multivitamin capsule Take 1 capsule by mouth daily.   nicotine 14 mg/24hr patch Commonly known as: Nicoderm CQ Place 1 patch (14 mg total) onto the skin  daily. What changed: Another medication with the same name was removed. Continue taking this medication, and follow the directions you see here.   oxyCODONE 5 MG immediate release tablet Commonly known as: Oxy IR/ROXICODONE Take 1-2 tablets (5-10 mg total) by mouth every 4 (four) hours as needed for moderate pain.        Follow-up Information     Rolm Bookbinder, MD Follow up in 2 week(s).   Specialty: General Surgery Contact information: [address] [place] [address] [phone]                 Signed: Rolm Bookbinder 08/05/2020, 10:19 AM

## 2020-08-05 NOTE — Progress Notes (Signed)
Gave pt Breast Cancer Bag and reviewed its contents. Family member at bedside and understands how to empty JP drains and measure ml/chart to take to return appt Dr. Donne Hazel.  Reviewed arm precautions and general expectations.

## 2020-08-10 LAB — SURGICAL PATHOLOGY

## 2020-08-11 NOTE — Assessment & Plan Note (Signed)
08/04/20: Right mastectomy (Dr. 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 I discussed the margins as well as lymph node surgeries. We also discussed the final staging along with previously performed ER/PR and HER-2/neu testing.  Treatment Plan: 1. Neoadjuvant chemotherapy with TCH Perjeta 6 cycles followed by Herceptin Perjeta maintenance versus Kadcyla maintenance (based on response to neoadjuvant chemo) for 1 year 2. Followed bymastectomywith sentinel lymph node study 08/04/20 3. Followed by adjuvant radiation therapy 4.Followed by antiestrogen therapy 5.Consideration for neratinib ----------------------------------------------------------------------------------------------------------------- 03/15/20: Bone Scan: Subtle uptake in ribs: Non diagnostic 03/16/20: CT CAP: Large Rt breast mass with skin thickening, small sat lesion 1.8 cm, multiple enlarged LN, 4 mm LLL nodule ---------------------------------------------------------------------------------------------------------------- Current Treatment: Kadcyla q 3 weeks Adj XRT  RTC every 3 weeks

## 2020-08-11 NOTE — Progress Notes (Signed)
Patient Care Team: [name], MD as PCP - General (Hematology and Oncology) Pershing Proud, RN as Oncology Nurse Navigator Donnelly Angelica, RN as Oncology Nurse Navigator  DIAGNOSIS:    ICD-10-CM   1. Malignant neoplasm of upper-outer quadrant of right breast in female, estrogen receptor positive (HCC)  C50.411    Z17.0       SUMMARY OF ONCOLOGIC HISTORY: Oncology History  Malignant neoplasm of upper-outer quadrant of right breast in female, estrogen receptor positive (HCC)  03/10/2020 Initial Diagnosis   Palpable right breast mass x6 months.  Mammogram revealed left breast cysts (intraductal papilloma), 11 cm right breast mass and 2 masses 2.6 cm each in the right axilla: Biopsy grade 3 IDC ER 5% weak, PR 0%, Ki-67 25%, HER-2 3+ IHC positive   03/10/2020 Cancer Staging   Staging form: Breast, AJCC 8th Edition - Clinical stage from 03/10/2020: Stage IIIA (cT3, cN1, cM0, G3, ER+, PR-, HER2+) - Signed by [name], MD on 03/10/2020  Stage prefix: Initial diagnosis    03/19/2020 - 07/01/2020 Chemotherapy          07/22/2020 -  Chemotherapy    Patient is on Treatment Plan: BREAST TRASTUZUMAB  + PERTUZUMAB Q21D X 13 CYCLES       08/04/2020 Surgery   Right mastectomy (Dr. 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 with above-mentioned history of right breast cancer who is on currently neoadjuvant chemotherapy with TCH Perjeta. She underwent a right mastectomy with Dr. 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  She presents to the clinic today for follow-up.  ALLERGIES:  has No Known Allergies.  MEDICATIONS:  Current Outpatient Medications  Medication Sig Dispense Refill   ALPRAZolam (XANAX) 0.25 MG tablet Take 1 tablet (0.25 mg total) by mouth 2 (two) times daily as needed for anxiety. 60 tablet 3   esomeprazole (NEXIUM) 20 MG capsule Take 20 mg by mouth daily.     gabapentin (NEURONTIN) 300 MG capsule Take 1 capsule (300 mg total) by mouth 3 (three) times daily. 90 capsule 1   loperamide (IMODIUM) 2 MG capsule Take by mouth as needed for diarrhea or loose stools.     loratadine (CLARITIN) 10 MG tablet Take 10 mg by mouth daily as needed for rhinitis.     methocarbamol (ROBAXIN) 500 MG tablet Take 1 tablet (500 mg total) by mouth every 8 (eight) hours as needed for muscle spasms. 30 tablet 0   Multiple Vitamin (MULTIVITAMIN) capsule Take 1 capsule by mouth daily.     nicotine (NICODERM CQ) 14 mg/24hr patch Place 1 patch (14 mg total) onto the skin daily. 14 patch 0   oxyCODONE (OXY IR/ROXICODONE) 5 MG immediate release tablet Take 1-2 tablets (5-10 mg total) by mouth every 4 (four) hours as needed for moderate pain. 12 tablet 0   Current Facility-Administered Medications  Medication Dose Route Frequency Provider Last Rate Last Admin   levonorgestrel (MIRENA) 20 MCG/24HR IUD   Intrauterine Once Gottsegen, Rande Brunt, MD        PHYSICAL EXAMINATION: ECOG PERFORMANCE STATUS: 1 - Symptomatic but completely  ambulatory  Vitals:   08/12/20 0859  BP: (!) 157/69  Pulse: 82  Resp: 18  Temp: 97.9 F (36.6 C)  SpO2: 94%   Filed Weights   08/12/20 0859  Weight: 198 lb 4.8 oz (89.9 kg)    BREAST: No palpable masses or nodules in either right or left breasts. No palpable axillary supraclavicular or infraclavicular adenopathy no breast tenderness or nipple discharge. (exam performed in the presence of a chaperone)  LABORATORY DATA:  I have reviewed the data as listed CMP Latest Ref Rng & Units  08/05/2020 07/28/2020 07/01/2020  Glucose 70 - 99 mg/dL 215(H) 166(H) 175(H)  BUN 8 - 23 mg/dL $Remove'11 9 9  'ZkZVIyt$ Creatinine 0.44 - 1.00 mg/dL 0.65 0.72 0.74  Sodium 135 - 145 mmol/L 137 137 139  Potassium 3.5 - 5.1 mmol/L 3.8 4.2 3.9  Chloride 98 - 111 mmol/L 106 106 105  CO2 22 - 32 mmol/L $RemoveB'22 24 24  'ymNmWPeQ$ Calcium 8.9 - 10.3 mg/dL 8.7(L) 9.6 9.3  Total Protein 6.5 - 8.1 g/dL - - 6.8  Total Bilirubin 0.3 - 1.2 mg/dL - - 0.3  Alkaline Phos 38 - 126 U/L - - 91  AST 15 - 41 U/L - - 12(L)  ALT 0 - 44 U/L - - 12    Lab Results  Component Value Date   WBC 13.0 (H) 08/05/2020   HGB 9.4 (L) 08/05/2020   HCT 28.5 (L) 08/05/2020   MCV 97.9 08/05/2020   PLT 212 08/05/2020   NEUTROABS 4.8 07/01/2020    ASSESSMENT & PLAN:  Malignant neoplasm of upper-outer quadrant of right breast in female, estrogen receptor positive ([place]) 08/04/20: Right mastectomy (Dr. 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 I discussed the margins as well as lymph node surgeries. We also discussed the final staging along with previously performed ER/PR and HER-2/neu testing.  Treatment Plan: 1. Neoadjuvant chemotherapy with TCH Perjeta 6 cycles followed by Herceptin Perjeta maintenance versus Kadcyla maintenance (based on response to neoadjuvant chemo) for 1 year 2. Followed by mastectomy with sentinel lymph node study 08/04/20 3. Followed by adjuvant radiation therapy 4.  Decided not to do antiestrogen therapy because her ER was 0% on final path. 5.  Consideration for neratinib ----------------------------------------------------------------------------------------------------------------- 03/15/20: Bone Scan: Subtle uptake in ribs: Non diagnostic 03/16/20: CT CAP:  Large Rt breast mass with skin thickening, small sat lesion 1.8 cm, multiple enlarged LN, 4 mm LLL nodule ---------------------------------------------------------------------------------------------------------------- Current Treatment: Kadcyla q 3 weeks Adj XRT  RTC every 3 weeks    No orders of the defined types were placed in this encounter.  The patient has a good understanding of the overall plan. she agrees with it. she will call with any problems that may develop before the next visit here.  Total time spent: 30 mins including face to face time and time spent for planning, charting and coordination of care  Rulon Eisenmenger, MD, MPH 08/12/2020  I, Thana Ates, am acting as scribe for Dr. [name].  I have reviewed the above documentation for accuracy and completeness, and I agree with the above.

## 2020-08-12 ENCOUNTER — Encounter: Payer: Self-pay | Admitting: *Deleted

## 2020-08-12 ENCOUNTER — Other Ambulatory Visit: Payer: Self-pay

## 2020-08-12 ENCOUNTER — Inpatient Hospital Stay: Payer: Managed Care, Other (non HMO) | Attending: Hematology and Oncology | Admitting: Hematology and Oncology

## 2020-08-12 VITALS — BP 157/69 | HR 82 | Temp 97.9°F | Resp 18 | Ht 66.0 in | Wt 198.3 lb

## 2020-08-12 DIAGNOSIS — Z170 Estrogen receptor positive status [ER+]: Secondary | ICD-10-CM | POA: Diagnosis not present

## 2020-08-12 DIAGNOSIS — C50411 Malignant neoplasm of upper-outer quadrant of right female breast: Secondary | ICD-10-CM

## 2020-08-12 DIAGNOSIS — Z5112 Encounter for antineoplastic immunotherapy: Secondary | ICD-10-CM | POA: Insufficient documentation

## 2020-08-12 DIAGNOSIS — C773 Secondary and unspecified malignant neoplasm of axilla and upper limb lymph nodes: Secondary | ICD-10-CM | POA: Diagnosis not present

## 2020-08-14 NOTE — Progress Notes (Signed)
Pharmacist Chemotherapy Monitoring - Initial Assessment    Anticipated start date: 08/21/20   The following has been reviewed per standard work regarding the patient's treatment regimen: The patient's diagnosis, treatment plan and drug doses, and organ/hematologic function Lab orders and baseline tests specific to treatment regimen  The treatment plan start date, drug sequencing, and pre-medications Prior authorization status  Patient's documented medication list, including drug-drug interaction screen and prescriptions for anti-emetics and supportive care specific to the treatment regimen The drug concentrations, fluid compatibility, administration routes, and timing of the medications to be used The patient's access for treatment and lifetime cumulative dose history, if applicable  The patient's medication allergies and previous infusion related reactions, if applicable   Changes made to treatment plan:  N/A  Follow up needed:  Pending authorization for treatment    Sabrina Woods D, RPH, 08/14/2020  1:22 PM

## 2020-08-20 NOTE — Progress Notes (Signed)
Patient Care Team: [name], MD as PCP - General (Hematology and Oncology) Mauro Kaufmann, RN as Oncology Nurse Navigator Rockwell Germany, RN as Oncology Nurse Navigator  DIAGNOSIS:    ICD-10-CM   1. Malignant neoplasm of upper-outer quadrant of right breast in female, estrogen receptor positive ([place])  C50.411    Z17.0       SUMMARY OF ONCOLOGIC HISTORY: Oncology History  Malignant neoplasm of upper-outer quadrant of right breast in female, estrogen receptor positive ([place])  03/10/2020 Initial Diagnosis   Palpable right breast mass x6 months.  Mammogram revealed left breast cysts (intraductal papilloma), 11 cm right breast mass and 2 masses 2.6 cm each in the right axilla: Biopsy grade 3 IDC ER 5% weak, PR 0%, Ki-67 25%, HER-2 3+ IHC positive   03/10/2020 Cancer Staging   Staging form: Breast, AJCC 8th Edition - Clinical stage from 03/10/2020: Stage IIIA (cT3, cN1, cM0, G3, ER+, PR-, HER2+) - Signed by [name], MD on 03/10/2020 Stage prefix: Initial diagnosis   03/19/2020 - 07/01/2020 Chemotherapy          07/22/2020 - 07/22/2020 Chemotherapy          08/04/2020 Surgery   Right mastectomy (Dr. 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 with above-mentioned history of right breast cancer having undergone right mastectomy, to start chemotherapy with Kadcyla today. She presents to the clinic today for treatment.  She is here today to start her first cycle of Kadcyla.  She reports some fatigue and she is going to be set up with radiation and physical therapy as well.  ALLERGIES:  has No Known  Allergies.  MEDICATIONS:  Current Outpatient Medications  Medication Sig Dispense Refill   ALPRAZolam (XANAX) 0.25 MG tablet Take 1 tablet (0.25 mg total) by mouth 2 (two) times daily as needed for anxiety. 60 tablet 3   esomeprazole (NEXIUM) 20 MG capsule Take 20 mg by mouth daily.     loperamide (IMODIUM) 2 MG capsule Take by mouth as needed for diarrhea or loose stools.     loratadine (CLARITIN) 10 MG tablet Take 10 mg by mouth daily as needed for rhinitis.     Multiple Vitamin (MULTIVITAMIN) capsule Take 1 capsule by mouth daily.     nicotine (NICODERM CQ) 14 mg/24hr patch Place 1 patch (14 mg total) onto the skin daily. 14 patch 0   Current Facility-Administered Medications  Medication Dose Route Frequency Provider Last Rate Last Admin   levonorgestrel (MIRENA) 20 MCG/24HR IUD   Intrauterine Once Bennetta Laos, MD        PHYSICAL EXAMINATION: ECOG PERFORMANCE STATUS: 1 - Symptomatic but completely ambulatory  Vitals:   08/21/20 1056  BP: 130/63  Pulse: 96  Resp: 19  Temp: 97.6 F (36.4 C)  SpO2: 99%   Filed Weights   08/21/20 1056  Weight: 198 lb 9.6 oz (90.1 kg)    LABORATORY DATA:  I have reviewed the data as listed CMP Latest Ref Rng & Units 08/05/2020 07/28/2020 07/01/2020  Glucose 70 - 99 mg/dL 215(H) 166(H) 175(H)  BUN 8 - 23 mg/dL $Remove'11 9 9  'yMGygmu$ Creatinine  0.44 - 1.00 mg/dL 0.65 0.72 0.74  Sodium 135 - 145 mmol/L 137 137 139  Potassium 3.5 - 5.1 mmol/L 3.8 4.2 3.9  Chloride 98 - 111 mmol/L 106 106 105  CO2 22 - 32 mmol/L $RemoveB'22 24 24  'yaMsWlDG$ Calcium 8.9 - 10.3 mg/dL 8.7(L) 9.6 9.3  Total Protein 6.5 - 8.1 g/dL - - 6.8  Total Bilirubin 0.3 - 1.2 mg/dL - - 0.3  Alkaline Phos 38 - 126 U/L - - 91  AST 15 - 41 U/L - - 12(L)  ALT 0 - 44 U/L - - 12    Lab Results  Component Value Date   WBC 5.7 08/21/2020   HGB 11.3 (L) 08/21/2020   HCT 33.8 (L) 08/21/2020   MCV 96.0 08/21/2020   PLT 277 08/21/2020   NEUTROABS 3.3 08/21/2020    ASSESSMENT & PLAN:  Malignant  neoplasm of upper-outer quadrant of right breast in female, estrogen receptor positive ([place]) 08/04/20: Right mastectomy (Dr. 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 Neoadjuvant chemotherapy with TCH Perjeta 6 cycles followed by Kadcyla maintenance for 1 year 2. Followed by mastectomy with sentinel lymph node study 08/04/20 3. Followed by adjuvant radiation therapy 4.  Decided not to do antiestrogen therapy because her ER was 0% on final path. 5.  Consideration for neratinib ----------------------------------------------------------------------------------------------------------------- 03/15/20: Bone Scan: Subtle uptake in ribs: Non diagnostic 03/16/20: CT CAP: Large Rt breast mass with skin thickening, small sat lesion 1.8 cm, multiple enlarged LN, 4 mm LLL nodule ---------------------------------------------------------------------------------------------------------------- Current Treatment: Kadcyla q 3 weeks, today is the first cycle Echocardiogram 06/29/2020: EF 65 to 70% we will obtain her next echocardiogram in a month. Adj XRT will start soon.   RTC every 3 weeks    No orders of the defined types were placed in this encounter.  The patient has a good understanding of the overall plan. she agrees with it. she will call with any problems that may develop before the next visit here.  Total time spent: 30 mins including face to face time and time spent for planning, charting and coordination of care  Rulon Eisenmenger, MD, MPH 08/21/2020  I, Thana Ates, am acting as scribe for Dr. [name].  I have reviewed the above documentation for accuracy and completeness, and I agree with the above.

## 2020-08-21 ENCOUNTER — Inpatient Hospital Stay (HOSPITAL_BASED_OUTPATIENT_CLINIC_OR_DEPARTMENT_OTHER): Payer: Managed Care, Other (non HMO) | Admitting: Hematology and Oncology

## 2020-08-21 ENCOUNTER — Inpatient Hospital Stay: Payer: Managed Care, Other (non HMO)

## 2020-08-21 ENCOUNTER — Other Ambulatory Visit: Payer: Self-pay

## 2020-08-21 DIAGNOSIS — C50411 Malignant neoplasm of upper-outer quadrant of right female breast: Secondary | ICD-10-CM

## 2020-08-21 DIAGNOSIS — Z95828 Presence of other vascular implants and grafts: Secondary | ICD-10-CM

## 2020-08-21 DIAGNOSIS — Z170 Estrogen receptor positive status [ER+]: Secondary | ICD-10-CM | POA: Diagnosis not present

## 2020-08-21 DIAGNOSIS — Z5112 Encounter for antineoplastic immunotherapy: Secondary | ICD-10-CM | POA: Diagnosis not present

## 2020-08-21 LAB — CBC WITH DIFFERENTIAL (CANCER CENTER ONLY)
Abs Immature Granulocytes: 0.01 10*3/uL (ref 0.00–0.07)
Basophils Absolute: 0 10*3/uL (ref 0.0–0.1)
Basophils Relative: 1 %
Eosinophils Absolute: 0.2 10*3/uL (ref 0.0–0.5)
Eosinophils Relative: 3 %
HCT: 33.8 % — ABNORMAL LOW (ref 36.0–46.0)
Hemoglobin: 11.3 g/dL — ABNORMAL LOW (ref 12.0–15.0)
Immature Granulocytes: 0 %
Lymphocytes Relative: 31 %
Lymphs Abs: 1.8 10*3/uL (ref 0.7–4.0)
MCH: 32.1 pg (ref 26.0–34.0)
MCHC: 33.4 g/dL (ref 30.0–36.0)
MCV: 96 fL (ref 80.0–100.0)
Monocytes Absolute: 0.4 10*3/uL (ref 0.1–1.0)
Monocytes Relative: 6 %
Neutro Abs: 3.3 10*3/uL (ref 1.7–7.7)
Neutrophils Relative %: 59 %
Platelet Count: 277 10*3/uL (ref 150–400)
RBC: 3.52 MIL/uL — ABNORMAL LOW (ref 3.87–5.11)
RDW: 13.2 % (ref 11.5–15.5)
WBC Count: 5.7 10*3/uL (ref 4.0–10.5)
nRBC: 0 % (ref 0.0–0.2)

## 2020-08-21 LAB — CMP (CANCER CENTER ONLY)
ALT: 15 U/L (ref 0–44)
AST: 14 U/L — ABNORMAL LOW (ref 15–41)
Albumin: 3.6 g/dL (ref 3.5–5.0)
Alkaline Phosphatase: 96 U/L (ref 38–126)
Anion gap: 11 (ref 5–15)
BUN: 10 mg/dL (ref 8–23)
CO2: 24 mmol/L (ref 22–32)
Calcium: 9.7 mg/dL (ref 8.9–10.3)
Chloride: 107 mmol/L (ref 98–111)
Creatinine: 0.9 mg/dL (ref 0.44–1.00)
GFR, Estimated: >60 mL/min (ref >60)
Glucose, Bld: 184 mg/dL — ABNORMAL HIGH (ref 70–99)
Potassium: 3.9 mmol/L (ref 3.5–5.1)
Sodium: 142 mmol/L (ref 135–145)
Total Bilirubin: 0.3 mg/dL (ref 0.3–1.2)
Total Protein: 6.9 g/dL (ref 6.5–8.1)

## 2020-08-21 MED ORDER — SODIUM CHLORIDE 0.9% FLUSH: 10.0000 mL | INTRAVENOUS | Status: DC | PRN

## 2020-08-21 MED ORDER — DIPHENHYDRAMINE HCL 25 MG PO CAPS
50.0000 mg | ORAL_CAPSULE | Freq: Once | ORAL | Status: AC
  Administered 2020-08-21: 50 mg via ORAL
  Filled 2020-08-21: qty 2

## 2020-08-21 MED ORDER — SODIUM CHLORIDE 0.9 % IV SOLN
3.6000 mg/kg | Freq: Once | INTRAVENOUS | Status: AC
  Administered 2020-08-21: 320 mg via INTRAVENOUS
  Filled 2020-08-21: qty 16

## 2020-08-21 MED ORDER — SODIUM CHLORIDE 0.9% FLUSH
10.0000 mL | Freq: Once | INTRAVENOUS | Status: AC
  Administered 2020-08-21: 10 mL

## 2020-08-21 MED ORDER — SODIUM CHLORIDE 0.9 % IV SOLN: Freq: Once | INTRAVENOUS | Status: AC

## 2020-08-21 MED ORDER — HEPARIN SOD (PORK) LOCK FLUSH 100 UNIT/ML IV SOLN: 500.0000 [IU] | Freq: Once | INTRAVENOUS | Status: DC | PRN

## 2020-08-21 MED ORDER — ACETAMINOPHEN 325 MG PO TABS
650.0000 mg | ORAL_TABLET | Freq: Once | ORAL | Status: AC
  Administered 2020-08-21: 650 mg via ORAL
  Filled 2020-08-21: qty 2

## 2020-08-21 NOTE — Patient Instructions (Addendum)
[facility]  Discharge Instructions: Thank you for choosing [facility] to provide your oncology and hematology care.   If you have a lab appointment with the [facility], please go directly to the [facility] and check in at the registration area.   Wear comfortable clothing and clothing appropriate for easy access to any Portacath or PICC line.   We strive to give you quality time with your provider. You may need to reschedule your appointment if you arrive late (15 or more minutes).  Arriving late affects you and other patients whose appointments are after yours.  Also, if you miss three or more appointments without notifying the office, you may be dismissed from the clinic at the provider's discretion.      For prescription refill requests, have your pharmacy contact our office and allow 72 hours for refills to be completed.    Today you received the following chemotherapy and/or immunotherapy agents Ado-Trastuzumab (Kadcyla)      To help prevent nausea and vomiting after your treatment, we encourage you to take your nausea medication as directed.  BELOW ARE SYMPTOMS THAT SHOULD BE REPORTED IMMEDIATELY: *FEVER GREATER THAN 100.4 F (38 C) OR HIGHER *CHILLS OR SWEATING *NAUSEA AND VOMITING THAT IS NOT CONTROLLED WITH YOUR NAUSEA MEDICATION *UNUSUAL SHORTNESS OF BREATH *UNUSUAL BRUISING OR BLEEDING *URINARY PROBLEMS (pain or burning when urinating, or frequent urination) *BOWEL PROBLEMS (unusual diarrhea, constipation, pain near the anus) TENDERNESS IN MOUTH AND THROAT WITH OR WITHOUT PRESENCE OF ULCERS (sore throat, sores in mouth, or a toothache) UNUSUAL RASH, SWELLING OR PAIN  UNUSUAL VAGINAL DISCHARGE OR ITCHING   Items with * indicate a potential emergency and should be followed up as soon as possible or go to the Emergency Department if any problems should occur.  Please show the CHEMOTHERAPY ALERT CARD or IMMUNOTHERAPY ALERT CARD  at check-in to the Emergency Department and triage nurse.  Should you have questions after your visit or need to cancel or reschedule your appointment, please contact [facility]  Dept: [phone]  and follow the prompts.  Office hours are 8:00 a.m. to 4:30 p.m. Monday - Friday. Please note that voicemails left after 4:00 p.m. may not be returned until the following business day.  We are closed weekends and major holidays. You have access to a nurse at all times for urgent questions. Please call the main number to the clinic Dept: [phone] and follow the prompts.   For any non-urgent questions, you may also contact your provider using MyChart. We now offer e-Visits for anyone 63 and older to request care online for non-urgent symptoms. For details visit [email or web address].   Also download the MyChart app! Go to the app store, search "MyChart", open the app, select [facility], and log in with your MyChart username and password.  Due to Covid, a mask is required upon entering the hospital/clinic. If you do not have a mask, one will be given to you upon arrival. For doctor visits, patients may have 1 support person aged 45 or older with them. For treatment visits, patients cannot have anyone with them due to current Covid guidelines and our immunocompromised population.   Ado-Trastuzumab Emtansine for injection What is this medication? ADO-TRASTUZUMAB EMTANSINE (ADD oh traz TOO zuh mab em TAN zine) is a monoclonalantibody combined with chemotherapy. It is used to treat breast cancer. This medicine may be used for other purposes; ask your health care provider orpharmacist if you  have questions. COMMON BRAND NAME(S): Kadcyla What should I tell my care team before I take this medication? They need to know if you have any of these conditions: heart disease heart failure infection (especially a virus infection such as chickenpox, cold sores, or herpes) liver  disease lung or breathing disease, like asthma tingling of the fingers or toes, or other nerve disorder an unusual or allergic reaction to ado-trastuzumab emtansine, other medications, foods, dyes, or preservatives pregnant or trying to get pregnant breast-feeding How should I use this medication? This medicine is for infusion into a vein. It is given by a health careprofessional in a hospital or clinic setting. Talk to your pediatrician regarding the use of this medicine in children.Special care may be needed. Overdosage: If you think you have taken too much of this medicine contact apoison control center or emergency room at once. NOTE: This medicine is only for you. Do not share this medicine with others. What if I miss a dose? It is important not to miss your dose. Call your doctor or health careprofessional if you are unable to keep an appointment. What may interact with this medication? This medicine may also interact with the following medications: atazanavir boceprevir clarithromycin delavirdine indinavir dalfopristin; quinupristin isoniazid, INH itraconazole ketoconazole nefazodone nelfinavir ritonavir telaprevir telithromycin tipranavir voriconazole This list may not describe all possible interactions. Give your health care provider a list of all the medicines, herbs, non-prescription drugs, or dietary supplements you use. Also tell them if you smoke, drink alcohol, or use illegaldrugs. Some items may interact with your medicine. What should I watch for while using this medication? Visit your doctor for checks on your progress. This drug may make you feel generally unwell. This is not uncommon, as chemotherapy can affect healthy cells as well as cancer cells. Report any side effects. Continue your course oftreatment even though you feel ill unless your doctor tells you to stop. You may need blood work done while you are taking this medicine. Call your doctor or health  care professional for advice if you get a fever, chills or sore throat, or other symptoms of a cold or flu. Do not treat yourself. This drug decreases your body's ability to fight infections. Try toavoid being around people who are sick. Be careful brushing and flossing your teeth or using a toothpick because you may get an infection or bleed more easily. If you have any dental work done,tell your dentist you are receiving this medicine. Avoid taking products that contain aspirin, acetaminophen, ibuprofen, naproxen, or ketoprofen unless instructed by your doctor. These medicines may hide afever. Do not become pregnant while taking this medicine or for 7 months after stopping it, men with female partners should use contraception during treatment and for 4 months after the last dose. Women should inform their doctor if they wish to become pregnant or think they might be pregnant. There is a potential for serious side effects to an unborn child. Do not breast-feed an infant whiletaking this medicine or for 7 months after the last dose. Men who have a partner who is pregnant or who is capable of becoming pregnant should use a condom during sexual activity while taking this medicine and for 4 months after stopping it. Men should inform their doctors if they wish to father a child. This medicine may lower sperm counts. Talk to your health careprofessional or pharmacist for more information. What side effects may I notice from receiving this medication? Side effects that you should report to  your doctor or health care professionalas soon as possible: allergic reactions like skin rash, itching or hives, swelling of the face, lips, or tongue breathing problems chest pain or palpitations fever or chills, sore throat general ill feeling or flu-like symptoms light-colored stools nausea, vomiting pain, tingling, numbness in the hands or feet signs and symptoms of bleeding such as bloody or black, tarry stools; red  or dark-brown urine; spitting up blood or brown material that looks like coffee grounds; red spots on the skin; unusual bruising or bleeding from the eye, gums, or nose swelling of the legs or ankles yellowing of the eyes or skin Side effects that usually do not require medical attention (report to yourdoctor or health care professional if they continue or are bothersome): changes in taste constipation dizziness headache joint pain muscle pain trouble sleeping unusually weak or tired This list may not describe all possible side effects. Call your doctor for medical advice about side effects. You may report side effects to FDA at1-800-FDA-1088. Where should I keep my medication? This drug is given in a hospital or clinic and will not be stored at home. NOTE: This sheet is a summary. It may not cover all possible information. If you have questions about this medicine, talk to your doctor, pharmacist, orhealth care provider.  2022 Elsevier/Gold Standard (2017-05-19 10:03:15)

## 2020-08-21 NOTE — Assessment & Plan Note (Signed)
08/04/20: Right mastectomy (Dr. 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 Neoadjuvant chemotherapy with TCH Perjeta 6 cycles followed by Kadcyla maintenance for 1 year 2. Followed bymastectomywith sentinel lymph node study 08/04/20 3. Followed by adjuvant radiation therapy 4.Decided not to do antiestrogen therapy because her ER was 0% on final path. 5.Consideration for neratinib ----------------------------------------------------------------------------------------------------------------- 03/15/20: Bone Scan: Subtle uptake in ribs: Non diagnostic 03/16/20: CT CAP: Large Rt breast mass with skin thickening, small sat lesion 1.8 cm, multiple enlarged LN, 4 mm LLL nodule ---------------------------------------------------------------------------------------------------------------- Current Treatment: Kadcyla q 3 weeks, today is the first cycle Echocardiogram 06/29/2020: EF 65 to 70% we will obtain her next echocardiogram in a month. Adj XRT will start soon.  RTC every 3 weeks

## 2020-08-21 NOTE — Progress Notes (Signed)
Pt tolerated the Ado-Trastuzumab infusion well.  1hr and ninety minute post observation performed as ordered.  Pt discharged home stable and WNL.  Pt ambulated out of the infusion clinic.

## 2020-08-26 ENCOUNTER — Other Ambulatory Visit: Payer: Self-pay

## 2020-08-26 ENCOUNTER — Encounter: Payer: Self-pay | Admitting: Physical Therapy

## 2020-08-26 ENCOUNTER — Ambulatory Visit: Payer: Managed Care, Other (non HMO) | Attending: General Surgery | Admitting: Physical Therapy

## 2020-08-26 DIAGNOSIS — Z170 Estrogen receptor positive status [ER+]: Secondary | ICD-10-CM | POA: Diagnosis present

## 2020-08-26 DIAGNOSIS — Z483 Aftercare following surgery for neoplasm: Secondary | ICD-10-CM | POA: Diagnosis present

## 2020-08-26 DIAGNOSIS — C50411 Malignant neoplasm of upper-outer quadrant of right female breast: Secondary | ICD-10-CM | POA: Insufficient documentation

## 2020-08-26 DIAGNOSIS — R293 Abnormal posture: Secondary | ICD-10-CM | POA: Insufficient documentation

## 2020-08-26 DIAGNOSIS — M25611 Stiffness of right shoulder, not elsewhere classified: Secondary | ICD-10-CM | POA: Insufficient documentation

## 2020-08-26 NOTE — Therapy (Signed)
[place], [place], [address] Phone: [phone]   Fax:  [phone]  Physical Therapy Treatment  Patient Details  Name: Sabrina Woods MRN: [id number] Date of Birth: 1955-01-21 Referring Provider (PT): Lindi Adie   Encounter Date: 08/26/2020   PT End of Session - 08/26/20 1451     Visit Number 2    Number of Visits 9    Date for PT Re-Evaluation 10/07/20   on hold for 2 weeks due to skin integrity   PT Start Time [id number]   pt arrived late   PT Stop Time 1449    PT Time Calculation (min) 32 min    Activity Tolerance Patient tolerated treatment well    Behavior During Therapy [facility] for tasks assessed/performed             Past Medical History:  Diagnosis Date   Acid reflux    Anxiety    Breast cancer ([place])    Depression    HISTORY OF   Fibroid    PONV (postoperative nausea and vomiting)    Smoker     Past Surgical History:  Procedure Laterality Date   CESAREAN SECTION     X 2   MASTECTOMY WITH AXILLARY LYMPH NODE DISSECTION Right 08/04/2020   Procedure: RIGHT MASTECTOMY WITH RIGHT AXILLARY LYMPH NODE DISSECTION;  Surgeon: Rolm Bookbinder, MD;  Location: [facility] [place];  Service: General;  Laterality: Right;   PORTACATH PLACEMENT Left 03/17/2020   Procedure: INSERTION PORT-A-CATH;  Surgeon: Rolm Bookbinder, MD;  Location: [facility];  Service: General;  Laterality: Left;    There were no vitals filed for this visit.   Subjective Assessment - 08/26/20 1418     Subjective It is getting better every day but when I move my arm a certain way down and back it is sore. I can reach up better than I have been.    Pertinent History Pt palpated a lump in her right breast 6 months ago.  She was seen by her gynecologist who ordered Mammogram/ [place].  Cancer is located in the upper-outer quadrant of the right breast and is ER+.  She is presently having neoadjuvant chemo, and will have radiation  following surgery and anti-estrogen.    Patient Stated Goals to try to get back to normal    Currently in Pain? No/denies    Pain Score 0-No pain                [facility] PT Assessment - 08/26/20 0001       Assessment   Medical Diagnosis Right Breast Cancer    Referring Provider (PT) Gudena    Onset Date/Surgical Date 08/04/20    Hand Dominance Right    Prior Therapy baselines      Precautions   Precautions Other (comment)    Precaution Comments at risk of lymphedema      Restrictions   Weight Bearing Restrictions No      Balance Screen   Has the patient fallen in the past 6 months No    Has the patient had a decrease in activity level because of a fear of falling?  No    Is the patient reluctant to leave their home because of a fear of falling?  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    Leisure pt is doing post op exercises      Cognition   Overall Cognitive Status Within Functional Limits for tasks assessed      Observation/Other Assessments   Skin Integrity scar is healing is steri strips in place, lateral aspect more open      Coordination   Gross Motor Movements are Fluid and Coordinated --      Posture/Postural Control   Posture/Postural Control Postural limitations    Postural Limitations Rounded Shoulders;Forward head      AROM   Right Shoulder Extension 56 Degrees    Right Shoulder Flexion 139 Degrees    Right Shoulder ABduction 145 Degrees    Right Shoulder Internal Rotation 66 Degrees    Right Shoulder External Rotation 77 Degrees    Left Shoulder Extension --    Left Shoulder Flexion --    Left Shoulder ABduction --    Left Shoulder Internal Rotation --     Left Shoulder External Rotation --               LYMPHEDEMA/ONCOLOGY QUESTIONNAIRE - 08/26/20 0001       Right Upper Extremity Lymphedema   15 cm Proximal to Olecranon Process 32.8 cm    Olecranon Process 29 cm    15 cm Proximal to Ulnar Styloid Process 27.5 cm    Just Proximal to Ulnar Styloid Process 17.1 cm    Across Hand at [employer] 19.8 cm    At [place] of 2nd Digit 6.3 cm      Left Upper Extremity Lymphedema   15 cm Proximal to Olecranon Process 33 cm    Olecranon Process 28 cm    15 cm Proximal to Ulnar Styloid Process 27 cm    Just Proximal to Ulnar Styloid Process 17.3 cm    Across Hand at [employer] 19 cm    At [place] of 2nd Digit 6.2 cm                                PT Education - 08/26/20 1450     Education Details re educated pt about lymphedema and how she is at an increased risk since she had an ALND with positive nodes, plan of care    Person(s) Educated Patient;Spouse    Methods Explanation    Comprehension Verbalized understanding                 PT Long Term Goals - 08/26/20 1457       PT LONG TERM GOAL #1   Title Pts post surgical shoulder ROM and function will be equal to pre-surgical ROM    Time 22    Period Weeks    Status On-going      PT LONG TERM GOAL #2   Title Pt will demonstrate 150 degrees of R shoulder abduction with no end range tightness across R pec to allow pt to return to PLOF    Baseline 145    Time 4    Period Weeks    Status New    Target Date 10/07/20      PT LONG TERM GOAL #3   Title Pt will demonstrate 90 degrees of R shoulder external rotation to allow her to return to PLOF.  Baseline 77    Time 4    Period Weeks    Status New    Target Date 10/07/20      PT LONG TERM GOAL #4   Title Pt will be independent in a home exercise program for long term stretching and strengthening    Time 6    Period Weeks    Status New    Target Date 09/23/20      PT LONG TERM GOAL #5    Title Pt will be able to verbalize lymphedema risk reduction practices    Time 6    Period Weeks    Status New    Target Date 10/07/20                   Plan - 08/26/20 1452     Clinical Impression Statement Pt returns to PT after completing neoadjuvant chemo and undergoing a R breast mastectomy with ALND with 2 positive nodes. Plan is for pt to complete radiation. Her scar is healing with steri strips intact but lateral aspect of her scar is still open and is pulled with R shoulder abduction and flexion. Pt has tightness with abduction and flexion and would benefit from skilled PT services to improve R shoulder ROM and decrease tightness. Will place pt on hold for 2 weeks to allow her skin to heal before she returns. Scheduled pt for follow up SOZO 3 months from her surgery to detect subclinical levels of lymphedema.    PT Frequency 2x / week    PT Duration 4 weeks    PT Treatment/Interventions Patient/family education;ADLs/Self Care Home Management;Manual techniques;Manual lymph drainage;Scar mobilization;Passive range of motion;Taping;Therapeutic exercise    PT Next Visit Plan assess skin integrity, schedule for ABC class, begin ROM to R shoulder in to flexion and abduction and R pec stretching    PT Home Exercise Plan post op breast exercises    Consulted and Agree with Plan of Care Patient             Patient will benefit from skilled therapeutic intervention in order to improve the following deficits and impairments:  Decreased knowledge of precautions, Postural dysfunction, Decreased range of motion, Decreased scar mobility, Pain, Impaired UE functional use, Increased fascial restricitons  Visit Diagnosis: Stiffness of right shoulder, not elsewhere classified  Aftercare following surgery for neoplasm  Abnormal posture  Malignant neoplasm of upper-outer quadrant of right breast in female, estrogen receptor positive ([place])     Problem List Patient Active Problem  List   Diagnosis Date Noted   S/P mastectomy, right 08/04/2020   Chemotherapy-induced peripheral neuropathy ([place]) 05/20/2020   Port-A-Cath in place 03/19/2020   Malignant neoplasm of upper-outer quadrant of right breast in female, estrogen receptor positive ([place]) 03/10/2020   Fibroid    Smoker    Depression     Allyson Sabal [facility] 08/26/2020, 3:01 PM  [place], [place], [address] Phone: [phone]   Fax:  [phone]  Name: Sabrina Woods MRN: [id number] Date of Birth: [birth year]   Manus Gunning, PT 08/26/20 3:01 PM

## 2020-08-26 NOTE — Therapy (Deleted)
[place], [place], [address] Phone: [phone]   Fax:  [phone]  Physical Therapy Evaluation  Patient Details  Name: Sabrina Woods MRN: [id number] Date of Birth: [birth year] Referring Provider (PT): Donne Hazel   Encounter Date: 08/26/2020   PT End of Session - 08/26/20 1451     Visit Number 2    Number of Visits 9    Date for PT Re-Evaluation 10/07/20   on hold for 2 weeks due to skin integrity   PT Start Time 1417   pt arrived late   PT Stop Time 1449    PT Time Calculation (min) 32 min    Activity Tolerance Patient tolerated treatment well    Behavior During Therapy [facility] for tasks assessed/performed             Past Medical History:  Diagnosis Date   Acid reflux    Anxiety    Breast cancer ([place])    Depression    HISTORY OF   Fibroid    PONV (postoperative nausea and vomiting)    Smoker     Past Surgical History:  Procedure Laterality Date   CESAREAN SECTION     X 2   MASTECTOMY WITH AXILLARY LYMPH NODE DISSECTION Right 08/04/2020   Procedure: RIGHT MASTECTOMY WITH RIGHT AXILLARY LYMPH NODE DISSECTION;  Surgeon: Rolm Bookbinder, MD;  Location: [place];  Service: General;  Laterality: Right;   PORTACATH PLACEMENT Left 03/17/2020   Procedure: INSERTION PORT-A-CATH;  Surgeon: Rolm Bookbinder, MD;  Location: [place];  Service: General;  Laterality: Left;    There were no vitals filed for this visit.    Subjective Assessment - 08/26/20 1418     Subjective It is getting better every day but when I move my arm a certain way down and back it is sore. I can reach up better than I have been.    Pertinent History Pt palpated a lump in her right breast 6 months ago.  She was seen by her gynecologist who ordered Mammogram/ [place].  Cancer is located in the upper-outer quadrant of the right breast and is ER+.  She is presently having neoadjuvant chemo, and will have  radiation following surgery and anti-estrogen.    Patient Stated Goals to try to get back to normal    Currently in Pain? No/denies    Pain Score 0-No pain                [facility] PT Assessment - 08/26/20 0001       Assessment   Medical Diagnosis Right Breast Cancer    Referring Provider (PT) Donne Hazel    Onset Date/Surgical Date 08/04/20    Hand Dominance Right    Prior Therapy baselines      Precautions   Precautions Other (comment)    Precaution Comments at risk of lymphedema      Restrictions   Weight Bearing Restrictions No      Balance Screen   Has the patient fallen in the past 6 months No    Has the patient had a decrease in activity level because of a fear of falling?  No    Is the patient reluctant to leave their home because of a fear of falling?  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    Leisure pt is doing post op exercises      Cognition   Overall Cognitive Status Within Functional Limits for tasks assessed      Observation/Other Assessments   Skin Integrity scar is healing is steri strips in place, lateral aspect more open      Coordination   Gross Motor Movements are Fluid and Coordinated --      Posture/Postural Control   Posture/Postural Control Postural limitations    Postural Limitations Rounded Shoulders;Forward head      AROM   Right Shoulder Extension 56 Degrees    Right Shoulder Flexion 139 Degrees    Right Shoulder ABduction 145 Degrees    Right Shoulder Internal Rotation 66 Degrees    Right Shoulder External Rotation 77 Degrees    Left Shoulder Extension --    Left Shoulder Flexion --    Left Shoulder ABduction --    Left Shoulder Internal  Rotation --    Left Shoulder External Rotation --               LYMPHEDEMA/ONCOLOGY QUESTIONNAIRE - 08/26/20 0001       Right Upper Extremity Lymphedema   15 cm Proximal to Olecranon Process 32.8 cm    Olecranon Process 29 cm    15 cm Proximal to Ulnar Styloid Process 27.5 cm    Just Proximal to Ulnar Styloid Process 17.1 cm    Across Hand at [employer] 19.8 cm    At [place] of 2nd Digit 6.3 cm      Left Upper Extremity Lymphedema   15 cm Proximal to Olecranon Process 33 cm    Olecranon Process 28 cm    15 cm Proximal to Ulnar Styloid Process 27 cm    Just Proximal to Ulnar Styloid Process 17.3 cm    Across Hand at [employer] 19 cm    At [place] of 2nd Digit 6.2 cm                     Objective measurements completed on examination: See above findings.               PT Education - 08/26/20 1450     Education Details re educated pt about lymphedema and how she is at an increased risk since she had an ALND with positive nodes, plan of care    Person(s) Educated Patient;Spouse    Methods Explanation    Comprehension Verbalized understanding                 PT Long Term Goals - 08/26/20 1457       PT LONG TERM GOAL #1   Title Pts post surgical shoulder ROM and function will be equal to pre-surgical ROM    Time 22    Period Weeks    Status On-going      PT LONG TERM GOAL #2   Title Pt will demonstrate 150 degrees of R shoulder abduction with no end range tightness across R pec to allow pt to return to PLOF    Baseline 145    Time 4    Period Weeks    Status New    Target Date 10/07/20      PT LONG TERM GOAL #3   Title Pt will demonstrate 90 degrees of  R shoulder external rotation to allow her to return to PLOF.    Baseline 77    Time 4    Period Weeks    Status New    Target Date 10/07/20      PT LONG TERM GOAL #4   Title Pt will be independent in a home exercise program for long term stretching and strengthening     Time 6    Period Weeks    Status New    Target Date 09/23/20      PT LONG TERM GOAL #5   Title Pt will be able to verbalize lymphedema risk reduction practices    Time 6    Period Weeks    Status New    Target Date 10/07/20                    Plan - 08/26/20 1452     Clinical Impression Statement Pt returns to PT after completing neoadjuvant chemo and undergoing a R breast mastectomy with ALND with 2 positive nodes. Plan is for pt to complete radiation. Her scar is healing with steri strips intact but lateral aspect of her scar is still open and is pulled with R shoulder abduction and flexion. Pt has tightness with abduction and flexion and would benefit from skilled PT services to improve R shoulder ROM and decrease tightness. Will place pt on hold for 2 weeks to allow her skin to heal before she returns. Scheduled pt for follow up SOZO 3 months from her surgery to detect subclinical levels of lymphedema.    PT Frequency 2x / week    PT Duration 4 weeks    PT Treatment/Interventions Patient/family education;ADLs/Self Care Home Management;Manual techniques;Manual lymph drainage;Scar mobilization;Passive range of motion;Taping;Therapeutic exercise    PT Next Visit Plan assess skin integrity, schedule for ABC class, begin ROM to R shoulder in to flexion and abduction and R pec stretching    PT Home Exercise Plan post op breast exercises    Consulted and Agree with Plan of Care Patient             Patient will benefit from skilled therapeutic intervention in order to improve the following deficits and impairments:  Decreased knowledge of precautions, Postural dysfunction, Decreased range of motion, Decreased scar mobility, Pain, Impaired UE functional use, Increased fascial restricitons  Visit Diagnosis: Stiffness of right shoulder, not elsewhere classified  Aftercare following surgery for neoplasm  Abnormal posture  Malignant neoplasm of upper-outer quadrant of right  breast in female, estrogen receptor positive ([place])     Problem List Patient Active Problem List   Diagnosis Date Noted   S/P mastectomy, right 08/04/2020   Chemotherapy-induced peripheral neuropathy ([place]) 05/20/2020   Port-A-Cath in place 03/19/2020   Malignant neoplasm of upper-outer quadrant of right breast in female, estrogen receptor positive ([place]) 03/10/2020   Fibroid    Smoker    Depression     Allyson Sabal [facility] 08/26/2020, 3:00 PM  [place], [place], [address] Phone: [phone]   Fax:  [phone]  Name: Sabrina Woods MRN: [id number] Date of Birth: 1955/11/14  Manus Gunning, PT 08/26/20 3:00 PM

## 2020-08-31 NOTE — Progress Notes (Signed)
New Breast Cancer Diagnosis: Right Breast UOQ  Did patient present with symptoms (if so, please note symptoms) or screening mammography?:Palpable mass, noted for about 6 months.   Location and Extent of disease :right breast. Located in the upper outer quadrant of the right breast, measured at 11 cm. Adenopathy, 2 masses 2.6 cm each in the right axilla.  Histology per Pathology Report: grade 3, Invasive Ductal Carcinoma  Receptor Status: ER(positive 5%, weak), PR (negative 0%), Her2-neu (positive), Ki-67(25%)   Surgeon and surgical plan, if any: Dr. 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  -Drains removed 08/17/2020 -Follow-up in 6 months   Medical oncologist, treatment if any:   Dr. Gudena8/10/2020 1. Neoadjuvant chemotherapy with TCH Perjeta 6 cycles followed by Herceptin Perjeta maintenance versus Kadcyla maintenance (based on response to neoadjuvant chemo) for 1 year 2. Followed by mastectomy with sentinel lymph node study 08/04/20 3. Followed by adjuvant radiation therapy 4.  Decided not to do antiestrogen therapy because her ER was 0% on final path. 5.  Consideration for neratinib   Family History of Breast/Ovarian/Prostate Cancer: Mom had breast.  Lymphedema issues, if any: No     Pain issues, if any: Has some tenderness noted under her arm.    SAFETY ISSUES: Prior radiation? No Pacemaker/ICD? No Possible current pregnancy? Postmenopausal Is the patient on methotrexate? No  Current Complaints / other details:   Port Insertion 03/17/2020, Left Chest

## 2020-09-01 ENCOUNTER — Encounter: Payer: Self-pay | Admitting: Licensed Clinical Social Worker

## 2020-09-01 ENCOUNTER — Other Ambulatory Visit: Payer: Self-pay

## 2020-09-01 ENCOUNTER — Ambulatory Visit
Admission: RE | Admit: 2020-09-01 | Discharge: 2020-09-01 | Disposition: A | Discharge status: Home / Self Care | Payer: Managed Care, Other (non HMO) | Source: Ambulatory Visit | Attending: Radiation Oncology | Admitting: Radiation Oncology

## 2020-09-01 ENCOUNTER — Encounter: Payer: Self-pay | Admitting: Radiation Oncology

## 2020-09-01 VITALS — BP 136/67 | HR 89 | Temp 97.2°F | Resp 18 | Ht 66.0 in | Wt 198.2 lb

## 2020-09-01 DIAGNOSIS — C50411 Malignant neoplasm of upper-outer quadrant of right female breast: Secondary | ICD-10-CM

## 2020-09-01 DIAGNOSIS — Z803 Family history of malignant neoplasm of breast: Secondary | ICD-10-CM | POA: Insufficient documentation

## 2020-09-01 DIAGNOSIS — Z170 Estrogen receptor positive status [ER+]: Secondary | ICD-10-CM

## 2020-09-01 DIAGNOSIS — F17210 Nicotine dependence, cigarettes, uncomplicated: Secondary | ICD-10-CM | POA: Diagnosis not present

## 2020-09-01 DIAGNOSIS — Z9011 Acquired absence of right breast and nipple: Secondary | ICD-10-CM | POA: Insufficient documentation

## 2020-09-01 DIAGNOSIS — K219 Gastro-esophageal reflux disease without esophagitis: Secondary | ICD-10-CM | POA: Diagnosis not present

## 2020-09-01 DIAGNOSIS — Z79899 Other long term (current) drug therapy: Secondary | ICD-10-CM | POA: Diagnosis not present

## 2020-09-01 NOTE — Progress Notes (Signed)
[place] Psychosocial Distress Screening Clinical Social Work  Clinical Social Work was referred by distress screening protocol.  The patient scored a 5 on the Psychosocial Distress Thermometer which indicates moderate distress. Clinical Social Worker contacted patient by phone to assess for distress and other psychosocial needs.   Patient reports that she is doing fairly well overall. She has strong support from family and friends and is ready to start radiation treatment.  Pt has been able to work from home, although took [employer] for August due to surgery and recovery. She will see how she feels in September and may do part-time if needed.  No practical resource needs at this time. CSW reviewed additional support programs available and provided direct contact information should any resource or support needs arise.  ON[facility] DISTRESS SCREENING 09/01/2020  Screening Type Initial Screening  Distress experienced in past week (1-10) 5  Other Contact via phone [phone]    Clinical Social Worker follow up needed: No.  If yes, follow up plan:  Severino Paolo E Alizah Sills, LCSW

## 2020-09-01 NOTE — Progress Notes (Signed)
Radiation Oncology         [phone] ________________________________  Name: Sabrina Woods        MRN: [id number]  Date of Service: 09/01/2020 DOB: 04/29/1955  PT:WSFKCL, Sabrina Dicker, MD  [name], MD     REFERRING PHYSICIAN: [name], MD   DIAGNOSIS: The encounter diagnosis was Malignant neoplasm of upper-outer quadrant of right breast in female, estrogen receptor positive ([place]).   HISTORY OF PRESENT ILLNESS: Sabrina Woods is a 65 y.o. female with a diagnosis of right breast cancer. The patient was noted to have a palpable mass in the right breast, this was worked up and found to be a large lesion measuring up to 10.5 cm in the upper outer quadrant with non-mass enhancement throughout the lower outer quadrant measuring up to 12.1 cm and a rim-enhancing mass measuring 2.2 cm posterior lateral to the primary mass.  She had numerous enlarged right level 1 axillary lymph nodes measuring up to 3.4 cm.  There was also on MRI scanning new finding in the left breast with a 9 mm enhancing mass at the site of a known intraductal papilloma.  She underwent several biopsies, her initial biopsy of the right breast on 03/04/2020 showed an ER weak positive PR negative HER2 amplified invasive ductal carcinoma with a Ki-67 of 25% in the right breast at 10:00 axilla, her tumor was grade 3.  Additional biopsy of the left breast showed hyalinized intraductal papilloma and fibrocystic change a benign left lymph node and additional fibrocystic change with calcifications in the outer specimen.  She went on to receive neoadjuvant chemotherapy between 03/19/2020, her last treatment was on 07/22/2020.  Posttreatment imaging showed persistent disease in the right breast measuring up to 9 cm involving the lateral anterior aspect of the pectoralis major and enlarged axillary nodes persisted.  She underwent modified radical mastectomy on the right side on 08/04/2020, final pathology revealed an 8.5 cm invasive ductal  carcinoma that was grade 3 her margins were negative and 2 matted group of lymph nodes follow-up, it was difficult to discern total number of nodes.  Extranodal extension was identified.  She is going to receive systemic Kadcyla and began her first infusion on 08/21/2020.  She is seen today to discuss adjuvant radiotherapy.   PREVIOUS RADIATION THERAPY: No   PAST MEDICAL HISTORY:  Past Medical History:  Diagnosis Date   Acid reflux    Anxiety    Breast cancer ([place])    Breast cancer ([place]) 2022   Depression    HISTORY OF   Fibroid    PONV (postoperative nausea and vomiting)    Smoker        PAST SURGICAL HISTORY: Past Surgical History:  Procedure Laterality Date   CESAREAN SECTION     X 2   MASTECTOMY WITH AXILLARY LYMPH NODE DISSECTION Right 08/04/2020   Procedure: RIGHT MASTECTOMY WITH RIGHT AXILLARY LYMPH NODE DISSECTION;  Surgeon: Rolm Bookbinder, MD;  Location: [facility];  Service: General;  Laterality: Right;   PORTACATH PLACEMENT Left 03/17/2020   Procedure: INSERTION PORT-A-CATH;  Surgeon: Rolm Bookbinder, MD;  Location: [facility];  Service: General;  Laterality: Left;     FAMILY HISTORY:  Family History  Problem Relation Age of Onset   Breast cancer Mother    Diabetes Mother    Hypertension Mother    Hypertension Father    Hypertension Sister    Hypertension Brother      SOCIAL HISTORY:  reports that she has been  smoking cigarettes. She has been smoking an average of .5 packs per day. She has never used smokeless tobacco. She reports current alcohol use.   ALLERGIES: Patient has no known allergies.   MEDICATIONS:  Current Outpatient Medications  Medication Sig Dispense Refill   ALPRAZolam (XANAX) 0.25 MG tablet Take 1 tablet (0.25 mg total) by mouth 2 (two) times daily as needed for anxiety. 60 tablet 3   esomeprazole (NEXIUM) 20 MG capsule Take 20 mg by mouth daily.     gabapentin (NEURONTIN) 300 MG capsule Take 300 mg by mouth 2 (two)  times daily.     loperamide (IMODIUM) 2 MG capsule Take by mouth as needed for diarrhea or loose stools.     loratadine (CLARITIN) 10 MG tablet Take 10 mg by mouth daily as needed for rhinitis.     Multiple Vitamin (MULTIVITAMIN) capsule Take 1 capsule by mouth daily.     nicotine (NICODERM CQ) 14 mg/24hr patch Place 1 patch (14 mg total) onto the skin daily. 14 patch 0   No current facility-administered medications for this encounter.     REVIEW OF SYSTEMS: On review of systems, the patient reports that she is doing well overall. She denies any chest pain, shortness of breath, cough, fevers, chills, night sweats, unintended weight changes. She denies any bowel or bladder disturbances, and denies abdominal pain, nausea or vomiting. She denies any new musculoskeletal or joint aches or pains. A complete review of systems is obtained and is otherwise negative.     PHYSICAL EXAM:  Wt Readings from Last 3 Encounters:  09/01/20 198 lb 4 oz (89.9 kg)  08/21/20 198 lb 9.6 oz (90.1 kg)  08/12/20 198 lb 4.8 oz (89.9 kg)   Temp Readings from Last 3 Encounters:  09/01/20 (!) 97.2 F (36.2 C) (Temporal)  08/21/20 (P) 98.6 F (37 C) ((P) Oral)  08/21/20 97.6 F (36.4 C) (Tympanic)   BP Readings from Last 3 Encounters:  09/01/20 136/67  08/21/20 (P) 135/63  08/21/20 130/63   Pulse Readings from Last 3 Encounters:  09/01/20 89  08/21/20 (P) 75  08/21/20 96    In general this is a well appearing Caucasian female in no acute distress. She's alert and oriented x4 and appropriate throughout the examination. Cardiopulmonary assessment is negative for acute distress and she exhibits normal effort.  Her right mastectomy scar line is well-healed in the medial and lateral aspect however in the axillary where this extends to, she does have some mild separation that is approximately 5 mm in greatest dimension.  Steri-Strips are in place.  There appears to be palpable cording in the right anterior axilla.   No cellulitic streaking bleeding or drainage is noted.  A dressing is intact and undisturbed over prior JP drain site.  ECOG = 1  0 - Asymptomatic (Fully active, able to carry on all predisease activities without restriction)  1 - Symptomatic but completely ambulatory (Restricted in physically strenuous activity but ambulatory and able to carry out work of a light or sedentary nature. For example, light housework, office work)  2 - Symptomatic, <50% in bed during the day (Ambulatory and capable of all self care but unable to carry out any work activities. Up and about more than 50% of waking hours)  3 - Symptomatic, >50% in bed, but not bedbound (Capable of only limited self-care, confined to bed or chair 50% or more of waking hours)  4 - Bedbound (Completely disabled. Cannot carry on any self-care. Totally confined to  bed or chair)  5 - Death   Eustace Pen MM, Creech RH, Tormey DC, et al. [phone]). "Toxicity and response criteria of the [facility]". [place] Oncol. 5 (6): 649-55    LABORATORY DATA:  Lab Results  Component Value Date   WBC 5.7 08/21/2020   HGB 11.3 (L) 08/21/2020   HCT 33.8 (L) 08/21/2020   MCV 96.0 08/21/2020   PLT 277 08/21/2020   Lab Results  Component Value Date   NA 142 08/21/2020   K 3.9 08/21/2020   CL 107 08/21/2020   CO2 24 08/21/2020   Lab Results  Component Value Date   ALT 15 08/21/2020   AST 14 (L) 08/21/2020   ALKPHOS 96 08/21/2020   BILITOT 0.3 08/21/2020      RADIOGRAPHY: No results found.     IMPRESSION/PLAN: 1. 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 Dr. Lisbeth Renshaw discusses the pathology findings and reviews the nature of node positive breast disease.  Dr. Lisbeth Renshaw discusses the rationale for adjuvant radiotherapy to the chest wall and regional lymph nodes to reduce risks of local recurrence.  She is also continuing with Kadcyla concurrently.  We  discussed the risks, benefits, short, and long term effects of radiotherapy, as well as the curative intent, and the patient is interested in proceeding. Dr. Lisbeth Renshaw discusses the delivery and logistics of radiotherapy and anticipates a course of 6 1/2 weeks of radiotherapy to the right chest wall and regional lymph nodes. Written consent is obtained and placed in the chart, a copy was provided to the patient.  She will simulate in the next 2 weeks.  2. Cording.  We will reach out to physical therapy however she does have these findings on exam she does have good range of motion and should still be able to tolerate positioning for radiotherapy.   In a visit lasting 60 minutes, greater than 50% of the time was spent face to face reviewing her case, as well as in preparation of, discussing, and coordinating the patient's care.  The above documentation reflects my direct findings during this shared patient visit. Please see the separate note by Dr. Lisbeth Renshaw on this date for the remainder of the patient's plan of care.    Carola Rhine, [facility]    **Disclaimer: This note was dictated with voice recognition software. Similar sounding words can inadvertently be transcribed and this note may contain transcription errors which may not have been corrected upon publication of note.**

## 2020-09-08 ENCOUNTER — Encounter: Payer: Self-pay | Admitting: *Deleted

## 2020-09-08 NOTE — Progress Notes (Signed)
Received fax from Lynnell Chad, RN with [employer] providing contact information if the office were to need any support with setting the patient up for home health, hospice, or DME.  Provided the contact number of [phone] ext [phone].

## 2020-09-10 ENCOUNTER — Ambulatory Visit
Admission: RE | Admit: 2020-09-10 | Discharge: 2020-09-10 | Disposition: A | Discharge status: Home / Self Care | Payer: Managed Care, Other (non HMO) | Source: Ambulatory Visit | Attending: Radiation Oncology | Admitting: Radiation Oncology

## 2020-09-10 ENCOUNTER — Ambulatory Visit: Payer: Managed Care, Other (non HMO)

## 2020-09-10 ENCOUNTER — Other Ambulatory Visit: Payer: Self-pay

## 2020-09-10 DIAGNOSIS — Z510 Encounter for antineoplastic radiation therapy: Secondary | ICD-10-CM | POA: Diagnosis not present

## 2020-09-10 DIAGNOSIS — Z483 Aftercare following surgery for neoplasm: Secondary | ICD-10-CM

## 2020-09-10 DIAGNOSIS — M25611 Stiffness of right shoulder, not elsewhere classified: Secondary | ICD-10-CM

## 2020-09-10 DIAGNOSIS — Z170 Estrogen receptor positive status [ER+]: Secondary | ICD-10-CM | POA: Diagnosis not present

## 2020-09-10 DIAGNOSIS — C50411 Malignant neoplasm of upper-outer quadrant of right female breast: Secondary | ICD-10-CM | POA: Insufficient documentation

## 2020-09-10 DIAGNOSIS — C773 Secondary and unspecified malignant neoplasm of axilla and upper limb lymph nodes: Secondary | ICD-10-CM | POA: Diagnosis not present

## 2020-09-10 DIAGNOSIS — R293 Abnormal posture: Secondary | ICD-10-CM

## 2020-09-10 DIAGNOSIS — Z5112 Encounter for antineoplastic immunotherapy: Secondary | ICD-10-CM | POA: Diagnosis not present

## 2020-09-10 NOTE — Patient Instructions (Signed)
SHOULDER: Flexion - Supine (Cane)        Cancer Rehab [phone]    Hold cane in both hands. Raise arms up overhead. Do not allow back to arch. Hold _5__ seconds. Do __5-10__ times; __1-2__ times a day.  Hands shoulder width apart; come straight back   Hands slightly wider apart; V position    Shoulder Blade Stretch    Clasp fingers behind head with elbows touching in front of face. Pull elbows back while pressing shoulder blades together. Relax and hold as tolerated, can place pillow under elbow here for comfort as needed and to allow for prolonged stretch.  Repeat __5__ times. Do __1-2__ sessions per day.     Copyright  VHI. All rights reserved.

## 2020-09-10 NOTE — Therapy (Signed)
[place], [place], [address] Phone: [phone]   Fax:  [phone]  Physical Therapy Treatment  Patient Details  Name: Sabrina Woods MRN: [id number] Date of Birth: [birth year] Referring Provider (PT): Lindi Adie   Encounter Date: 09/10/2020   PT End of Session - 09/10/20 1505     Visit Number 3    Number of Visits 9    Date for PT Re-Evaluation 10/07/20    PT Start Time 1406    PT Stop Time [id number]    PT Time Calculation (min) 53 min    Activity Tolerance Patient tolerated treatment well    Behavior During Therapy [facility] for tasks assessed/performed             Past Medical History:  Diagnosis Date   Acid reflux    Anxiety    Breast cancer ([place])    Breast cancer ([place]) 2022   Depression    HISTORY OF   Fibroid    PONV (postoperative nausea and vomiting)    Smoker     Past Surgical History:  Procedure Laterality Date   CESAREAN SECTION     X 2   MASTECTOMY WITH AXILLARY LYMPH NODE DISSECTION Right 08/04/2020   Procedure: RIGHT MASTECTOMY WITH RIGHT AXILLARY LYMPH NODE DISSECTION;  Surgeon: Rolm Bookbinder, MD;  Location: [facility];  Service: General;  Laterality: Right;   PORTACATH PLACEMENT Left 03/17/2020   Procedure: INSERTION PORT-A-CATH;  Surgeon: Rolm Bookbinder, MD;  Location: [facility];  Service: General;  Laterality: Left;    There were no vitals filed for this visit.   Subjective Assessment - 09/10/20 1407     Subjective Incisions look better, but its still sore under the arm.  the steri strips are still on.  I have been doing the post-op exercises and I can go up higher on the wall now. The shoulder feels stiff today.  Radiation will start Sept 15, 2022. I can wash my hair, and raise arm better. I have on my compression bra.    Patient is accompained by: Family member   husband   Pertinent History Pt palpated a lump in her right breast 6 months ago.  She was seen  by her gynecologist who ordered Mammogram/ [place].  Cancer is located in the upper-outer quadrant of the right breast and is ER+.  She is presently having neoadjuvant chemo, and will have radiation following surgery and anti-estrogen.    Patient Stated Goals to try to get back to normal    Currently in Pain? No/denies    Pain Score 0-No pain                               [facility] Adult PT Treatment/Exercise - 09/10/20 0001       Shoulder Exercises: Supine   Other Supine Exercises AA shoulder flexion and scaption with wand x 5      Shoulder Exercises: Pulleys   Flexion 2 minutes    Scaption 1 minute    ABduction 2 minutes      Shoulder Exercises: Therapy Ball   Flexion 10 reps      Manual Therapy   Manual therapy comments photos taken of right lateral incision secondary to redness.  Do not believe it is cellulitis but photo placed in chart and MD messaged.    Myofascial Release MFR to right axillary and medial Upper arm area of cording  Passive ROM PROM right shoulder flex, scaption, abd, ER with VC's to pt to relax                     PT Education - 09/10/20 1504     Education Details Educated in supine wand flex and scaption and educated in use of pulleys for flex, scaption and abduction.    Person(s) Educated Patient;Spouse    Methods Explanation;Handout    Comprehension Returned demonstration                 PT Long Term Goals - 08/26/20 1457       PT LONG TERM GOAL #1   Title Pts post surgical shoulder ROM and function will be equal to pre-surgical ROM    Time 22    Period Weeks    Status On-going      PT LONG TERM GOAL #2   Title Pt will demonstrate 150 degrees of R shoulder abduction with no end range tightness across R pec to allow pt to return to PLOF    Baseline 145    Time 4    Period Weeks    Status New    Target Date 10/07/20      PT LONG TERM GOAL #3   Title Pt will demonstrate 90 degrees of R shoulder external  rotation to allow her to return to PLOF.    Baseline 77    Time 4    Period Weeks    Status New    Target Date 10/07/20      PT LONG TERM GOAL #4   Title Pt will be independent in a home exercise program for long term stretching and strengthening    Time 6    Period Weeks    Status New    Target Date 09/23/20      PT LONG TERM GOAL #5   Title Pt will be able to verbalize lymphedema risk reduction practices    Time 6    Period Weeks    Status New    Target Date 10/07/20                   Plan - 09/10/20 1507     Clinical Impression Statement Pts skin assessed.  Steri strips still present with incision healing well except some redness noted at lateral incision.  Placed photo in media.  Appears to be where incision is healing. Pt. performed AAROM exs but has a tendency to really draw shoulders in when doing AAROM with her hands but improved when using the wand.  MFR was performed to loosen up areas of cording, and PROM was performed to right shoulder with Verbal cues for pt. to relax.  Tried pt with the pulleys and she did very well with no compensation at the UT..  May purchase the pulleys for home use. Pt felt much better and looser after treatment today.    Stability/Clinical Decision Making Stable/Uncomplicated    Rehab Potential Excellent    PT Frequency 2x / week    PT Duration 4 weeks    PT Treatment/Interventions Patient/family education;ADLs/Self Care Home Management;Manual techniques;Manual lymph drainage;Scar mobilization;Passive range of motion;Taping;Therapeutic exercise    PT Next Visit Plan assess skin integrity, schedule for ABC class, begin ROM to R shoulder in to flexion and abduction and R pec stretching    PT Home Exercise Plan post op breast exercises    Consulted and Agree with Plan of Care Patient  Patient will benefit from skilled therapeutic intervention in order to improve the following deficits and impairments:  Decreased knowledge  of precautions, Postural dysfunction, Decreased range of motion, Decreased scar mobility, Pain, Impaired UE functional use, Increased fascial restricitons  Visit Diagnosis: Stiffness of right shoulder, not elsewhere classified  Aftercare following surgery for neoplasm  Abnormal posture  Malignant neoplasm of upper-outer quadrant of right breast in female, estrogen receptor positive [facility])     Problem List Patient Active Problem List   Diagnosis Date Noted   S/P mastectomy, right 08/04/2020   Chemotherapy-induced peripheral neuropathy ([place]) 05/20/2020   Port-A-Cath in place 03/19/2020   Malignant neoplasm of upper-outer quadrant of right breast in female, estrogen receptor positive ([place]) 03/10/2020   Fibroid    Smoker    Depression     Claris Pong, PT 09/10/2020, 3:13 PM  [facility] [place], [place], [address] Phone: [phone]   Fax:  [phone]  Name: Sabrina Woods MRN: [id number] Date of Birth: 08-18-1955

## 2020-09-10 NOTE — Progress Notes (Signed)
Patient Care Team: [name], MD as PCP - General (Hematology and Oncology) Mauro Kaufmann, RN as Oncology Nurse Navigator Rockwell Germany, RN as Oncology Nurse Navigator  DIAGNOSIS:    ICD-10-CM   1. Malignant neoplasm of upper-outer quadrant of right breast in female, estrogen receptor positive ([place])  C50.411    Z17.0       SUMMARY OF ONCOLOGIC HISTORY: Oncology History  Malignant neoplasm of upper-outer quadrant of right breast in female, estrogen receptor positive ([place])  03/10/2020 Initial Diagnosis   Palpable right breast mass x6 months.  Mammogram revealed left breast cysts (intraductal papilloma), 11 cm right breast mass and 2 masses 2.6 cm each in the right axilla: Biopsy grade 3 IDC ER 5% weak, PR 0%, Ki-67 25%, HER-2 3+ IHC positive   03/10/2020 Cancer Staging   Staging form: Breast, AJCC 8th Edition - Clinical stage from 03/10/2020: Stage IIIA (cT3, cN1, cM0, G3, ER+, PR-, HER2+) - Signed by [name], MD on 03/10/2020 Stage prefix: Initial diagnosis   03/19/2020 - 07/01/2020 Chemotherapy   TCH Perjeta        08/04/2020 Surgery   Right mastectomy (Dr. 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, high-grade DCIS with necrosis and calcifications, and 2 right axillary lymph nodes positive for metastatic carcinoma   08/21/2020 -  Chemotherapy    Patient is on Treatment Plan: BREAST ADO-TRASTUZUMAB EMTANSINE ([place]) Q21D         CHIEF COMPLIANT: Cycle 2 Kadcyla  INTERVAL HISTORY: NALAYSIA MANGANIELLO is a 65 y.o. with above-mentioned history of right breast cancer having undergone right mastectomy, currently on chemotherapy with Kadcyla today. She presents to the clinic today for treatment.  She tells me that she is very anxious and has had some depression issues because she read note on my chart and misunderstood what it meant.  Did not have any nausea or vomiting to Kadcyla.  ALLERGIES:  has No Known  Allergies.  MEDICATIONS:  Current Outpatient Medications  Medication Sig Dispense Refill   ALPRAZolam (XANAX) 0.25 MG tablet Take 1 tablet (0.25 mg total) by mouth 2 (two) times daily as needed for anxiety. 60 tablet 3   esomeprazole (NEXIUM) 20 MG capsule Take 20 mg by mouth daily.     gabapentin (NEURONTIN) 300 MG capsule Take 300 mg by mouth 2 (two) times daily.     loperamide (IMODIUM) 2 MG capsule Take by mouth as needed for diarrhea or loose stools.     loratadine (CLARITIN) 10 MG tablet Take 10 mg by mouth daily as needed for rhinitis.     Multiple Vitamin (MULTIVITAMIN) capsule Take 1 capsule by mouth daily.     nicotine (NICODERM CQ) 14 mg/24hr patch Place 1 patch (14 mg total) onto the skin daily. 14 patch 0   No current facility-administered medications for this visit.    PHYSICAL EXAMINATION: ECOG PERFORMANCE STATUS: 1 - Symptomatic but completely ambulatory  Vitals:   09/11/20 1051  BP: (!) 171/81  Pulse: 98  Resp: 18  Temp: (!) 97.5 F (36.4 C)  SpO2: 99%   Filed Weights   09/11/20 1051  Weight: 198 lb 8 oz (90 kg)      LABORATORY DATA:  I have reviewed the data as listed CMP Latest Ref Rng & Units 09/11/2020 08/21/2020 08/05/2020  Glucose 70 - 99 mg/dL 192(H) 184(H) 215(H)  BUN 8 - 23 mg/dL $Remove'9 10 11  'ByPNtOK$ Creatinine 0.44 - 1.00 mg/dL 0.83 0.90 0.65  Sodium  135 - 145 mmol/L 140 142 137  Potassium 3.5 - 5.1 mmol/L 3.6 3.9 3.8  Chloride 98 - 111 mmol/L 105 107 106  CO2 22 - 32 mmol/L $RemoveB'23 24 22  'XzxOuofo$ Calcium 8.9 - 10.3 mg/dL 9.9 9.7 8.7(L)  Total Protein 6.5 - 8.1 g/dL 7.2 6.9 -  Total Bilirubin 0.3 - 1.2 mg/dL 0.4 0.3 -  Alkaline Phos 38 - 126 U/L 105 96 -  AST 15 - 41 U/L 22 14(L) -  ALT 0 - 44 U/L 22 15 -    Lab Results  Component Value Date   WBC 6.9 09/11/2020   HGB 12.4 09/11/2020   HCT 37.4 09/11/2020   MCV 94.0 09/11/2020   PLT 288 09/11/2020   NEUTROABS 4.1 09/11/2020    ASSESSMENT & PLAN:  Malignant neoplasm of upper-outer quadrant of right breast in  female, estrogen receptor positive ([place]) 08/04/20: Right mastectomy (Dr. 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 Neoadjuvant chemotherapy with TCH Perjeta 6 cycles followed by Kadcyla maintenance for 1 year 2. Followed by mastectomy with sentinel lymph node study 08/04/20 3. Followed by adjuvant radiation therapy 4.  Decided not to do antiestrogen therapy because her ER was 0% on final path. 5.  Consideration for neratinib ----------------------------------------------------------------------------------------------------------------- 03/15/20: Bone Scan: Subtle uptake in ribs: Non diagnostic 03/16/20: CT CAP: Large Rt breast mass with skin thickening, small sat lesion 1.8 cm, multiple enlarged LN, 4 mm LLL nodule ---------------------------------------------------------------------------------------------------------------- Current Treatment: Kadcyla q 3 weeks, today is cycle 2 Echocardiogram 06/29/2020: EF 65 to 70% we will obtain her next echocardiogram in a month. Adj XRT will start 09/18/2020  Kadcyla toxicities: Severe fatigue: Fatigue has finally started to subside Denies any nausea or vomiting.  She is able to eat regular food  RTC every 3 weeks    No orders of the defined types were placed in this encounter.  The patient has a good understanding of the overall plan. she agrees with it. she will call with any problems that may develop before the next visit here.  Total time spent: 30 mins including face to face time and time spent for planning, charting and coordination of care  Rulon Eisenmenger, MD, MPH 09/11/2020  I, Thana Ates, am acting as scribe for Dr. [name].  I have reviewed the above documentation for accuracy and completeness, and I agree with the above.

## 2020-09-11 ENCOUNTER — Encounter: Payer: Self-pay | Admitting: *Deleted

## 2020-09-11 ENCOUNTER — Encounter: Payer: Self-pay | Admitting: Hematology and Oncology

## 2020-09-11 ENCOUNTER — Inpatient Hospital Stay: Payer: Managed Care, Other (non HMO)

## 2020-09-11 ENCOUNTER — Inpatient Hospital Stay: Payer: Managed Care, Other (non HMO) | Attending: Hematology and Oncology

## 2020-09-11 ENCOUNTER — Inpatient Hospital Stay (HOSPITAL_BASED_OUTPATIENT_CLINIC_OR_DEPARTMENT_OTHER): Payer: Managed Care, Other (non HMO) | Admitting: Hematology and Oncology

## 2020-09-11 VITALS — BP 132/66 | HR 81

## 2020-09-11 DIAGNOSIS — R293 Abnormal posture: Secondary | ICD-10-CM | POA: Insufficient documentation

## 2020-09-11 DIAGNOSIS — C773 Secondary and unspecified malignant neoplasm of axilla and upper limb lymph nodes: Secondary | ICD-10-CM | POA: Insufficient documentation

## 2020-09-11 DIAGNOSIS — Z170 Estrogen receptor positive status [ER+]: Secondary | ICD-10-CM

## 2020-09-11 DIAGNOSIS — Z5112 Encounter for antineoplastic immunotherapy: Secondary | ICD-10-CM | POA: Insufficient documentation

## 2020-09-11 DIAGNOSIS — C50411 Malignant neoplasm of upper-outer quadrant of right female breast: Secondary | ICD-10-CM | POA: Diagnosis not present

## 2020-09-11 DIAGNOSIS — Z510 Encounter for antineoplastic radiation therapy: Secondary | ICD-10-CM | POA: Insufficient documentation

## 2020-09-11 DIAGNOSIS — M25611 Stiffness of right shoulder, not elsewhere classified: Secondary | ICD-10-CM | POA: Insufficient documentation

## 2020-09-11 DIAGNOSIS — Z95828 Presence of other vascular implants and grafts: Secondary | ICD-10-CM

## 2020-09-11 DIAGNOSIS — Z483 Aftercare following surgery for neoplasm: Secondary | ICD-10-CM | POA: Insufficient documentation

## 2020-09-11 LAB — CMP (CANCER CENTER ONLY)
ALT: 22 U/L (ref 0–44)
AST: 22 U/L (ref 15–41)
Albumin: 3.7 g/dL (ref 3.5–5.0)
Alkaline Phosphatase: 105 U/L (ref 38–126)
Anion gap: 12 (ref 5–15)
BUN: 9 mg/dL (ref 8–23)
CO2: 23 mmol/L (ref 22–32)
Calcium: 9.9 mg/dL (ref 8.9–10.3)
Chloride: 105 mmol/L (ref 98–111)
Creatinine: 0.83 mg/dL (ref 0.44–1.00)
GFR, Estimated: >60 mL/min (ref >60)
Glucose, Bld: 192 mg/dL — ABNORMAL HIGH (ref 70–99)
Potassium: 3.6 mmol/L (ref 3.5–5.1)
Sodium: 140 mmol/L (ref 135–145)
Total Bilirubin: 0.4 mg/dL (ref 0.3–1.2)
Total Protein: 7.2 g/dL (ref 6.5–8.1)

## 2020-09-11 LAB — CBC WITH DIFFERENTIAL (CANCER CENTER ONLY)
Abs Immature Granulocytes: 0 10*3/uL (ref 0.00–0.07)
Basophils Absolute: 0.1 10*3/uL (ref 0.0–0.1)
Basophils Relative: 1 %
Eosinophils Absolute: 0.1 10*3/uL (ref 0.0–0.5)
Eosinophils Relative: 2 %
HCT: 37.4 % (ref 36.0–46.0)
Hemoglobin: 12.4 g/dL (ref 12.0–15.0)
Immature Granulocytes: 0 %
Lymphocytes Relative: 31 %
Lymphs Abs: 2.1 10*3/uL (ref 0.7–4.0)
MCH: 31.2 pg (ref 26.0–34.0)
MCHC: 33.2 g/dL (ref 30.0–36.0)
MCV: 94 fL (ref 80.0–100.0)
Monocytes Absolute: 0.5 10*3/uL (ref 0.1–1.0)
Monocytes Relative: 7 %
Neutro Abs: 4.1 10*3/uL (ref 1.7–7.7)
Neutrophils Relative %: 59 %
Platelet Count: 288 10*3/uL (ref 150–400)
RBC: 3.98 MIL/uL (ref 3.87–5.11)
RDW: 12.5 % (ref 11.5–15.5)
WBC Count: 6.9 10*3/uL (ref 4.0–10.5)
nRBC: 0 % (ref 0.0–0.2)

## 2020-09-11 MED ORDER — SODIUM CHLORIDE 0.9 % IV SOLN: Freq: Once | INTRAVENOUS | Status: AC

## 2020-09-11 MED ORDER — ACETAMINOPHEN 325 MG PO TABS
ORAL_TABLET | ORAL | Status: AC
  Filled 2020-09-11: qty 2

## 2020-09-11 MED ORDER — SODIUM CHLORIDE 0.9 % IV SOLN
3.6000 mg/kg | Freq: Once | INTRAVENOUS | Status: AC
  Administered 2020-09-11: 320 mg via INTRAVENOUS
  Filled 2020-09-11: qty 16

## 2020-09-11 MED ORDER — HEPARIN SOD (PORK) LOCK FLUSH 100 UNIT/ML IV SOLN
500.0000 [IU] | Freq: Once | INTRAVENOUS | Status: AC | PRN
  Administered 2020-09-11: 500 [IU]

## 2020-09-11 MED ORDER — SODIUM CHLORIDE 0.9% FLUSH
10.0000 mL | Freq: Once | INTRAVENOUS | Status: AC
  Administered 2020-09-11: 10 mL

## 2020-09-11 MED ORDER — SODIUM CHLORIDE 0.9% FLUSH
10.0000 mL | INTRAVENOUS | Status: DC | PRN
  Administered 2020-09-11: 10 mL

## 2020-09-11 MED ORDER — DIPHENHYDRAMINE HCL 25 MG PO CAPS
ORAL_CAPSULE | ORAL | Status: AC
  Filled 2020-09-11: qty 2

## 2020-09-11 MED ORDER — ACETAMINOPHEN 325 MG PO TABS
650.0000 mg | ORAL_TABLET | Freq: Once | ORAL | Status: AC
  Administered 2020-09-11: 650 mg via ORAL

## 2020-09-11 MED ORDER — DIPHENHYDRAMINE HCL 25 MG PO CAPS
50.0000 mg | ORAL_CAPSULE | Freq: Once | ORAL | Status: AC
  Administered 2020-09-11: 50 mg via ORAL

## 2020-09-11 NOTE — Patient Instructions (Addendum)
[facility]COL[facility]  Discharge Instructions: Thank you for choosing [facility] to provide your oncology and hematology care.   If you have a lab appointment with the [facility], please go directly to the [facility] and check in at the registration area.   Wear comfortable clothing and clothing appropriate for easy access to any Portacath or PICC line.   We strive to give you quality time with your provider. You may need to reschedule your appointment if you arrive late (15 or more minutes).  Arriving late affects you and other patients whose appointments are after yours.  Also, if you miss three or more appointments without notifying the office, you may be dismissed from the clinic at the provider's discretion.      For prescription refill requests, have your pharmacy contact our office and allow 72 hours for refills to be completed.    Today you received the following chemotherapy and/or immunotherapy agent: Ado-Trastuzumab (Kadcyla)    To help prevent nausea and vomiting after your treatment, we encourage you to take your nausea medication as directed.  BELOW ARE SYMPTOMS THAT SHOULD BE REPORTED IMMEDIATELY: *FEVER GREATER THAN 100.4 F (38 C) OR HIGHER *CHILLS OR SWEATING *NAUSEA AND VOMITING THAT IS NOT CONTROLLED WITH YOUR NAUSEA MEDICATION *UNUSUAL SHORTNESS OF BREATH *UNUSUAL BRUISING OR BLEEDING *URINARY PROBLEMS (pain or burning when urinating, or frequent urination) *BOWEL PROBLEMS (unusual diarrhea, constipation, pain near the anus) TENDERNESS IN MOUTH AND THROAT WITH OR WITHOUT PRESENCE OF ULCERS (sore throat, sores in mouth, or a toothache) UNUSUAL RASH, SWELLING OR PAIN  UNUSUAL VAGINAL DISCHARGE OR ITCHING   Items with * indicate a potential emergency and should be followed up as soon as possible or go to the Emergency Department if any problems should occur.  Please show the CHEMOTHERAPY ALERT CARD or IMMUNOTHERAPY ALERT CARD at  check-in to the Emergency Department and triage nurse.  Should you have questions after your visit or need to cancel or reschedule your appointment, please contact [facility]  Dept: [phone]  and follow the prompts.  Office hours are 8:00 a.m. to 4:30 p.m. Monday - Friday. Please note that voicemails left after 4:00 p.m. may not be returned until the following business day.  We are closed weekends and major holidays. You have access to a nurse at all times for urgent questions. Please call the main number to the clinic Dept: [phone] and follow the prompts.   For any non-urgent questions, you may also contact your provider using MyChart. We now offer e-Visits for anyone 40 and older to request care online for non-urgent symptoms. For details visit [email or web address].   Also download the MyChart app! Go to the app store, search "MyChart", open the app, select , and log in with your MyChart username and password.  Due to Covid, a mask is required upon entering the hospital/clinic. If you do not have a mask, one will be given to you upon arrival. For doctor visits, patients may have 1 support person aged 25 or older with them. For treatment visits, patients cannot have anyone with them due to current Covid guidelines and our immunocompromised population.

## 2020-09-11 NOTE — Assessment & Plan Note (Signed)
08/04/20:Right mastectomy (Dr. 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 Neoadjuvant chemotherapy with TCH Perjeta 6 cycles followed by Kadcyla maintenance for 1 year 2. Followed bymastectomywith sentinel lymph node study8/2/22 3. Followed by adjuvant radiation therapy 4.Decided not to do antiestrogen therapy because her ER was 0% on final path. 5.Consideration for neratinib ----------------------------------------------------------------------------------------------------------------- 03/15/20: Bone Scan: Subtle uptake in ribs: Non diagnostic 03/16/20: CT CAP: Large Rt breast mass with skin thickening, small sat lesion 1.8 cm, multiple enlarged LN, 4 mm LLL nodule ---------------------------------------------------------------------------------------------------------------- Current Treatment: Kadcyla q 3 weeks, today is cycle 2 Echocardiogram 06/29/2020: EF 65 to 70% we will obtain her next echocardiogram in a month. Adj XRT will start 09/18/2020 Kadcyla toxicities:  RTC every 3 weeks

## 2020-09-14 ENCOUNTER — Telehealth: Payer: Self-pay | Admitting: *Deleted

## 2020-09-14 NOTE — Telephone Encounter (Signed)
Connected with Delfino Lovett, [employer] regarding form received 09/08/2020 for Sabrina Woods for update per [employer] not in red folder  of forms received week of 09/07/2020 through 09/11/2020. Forms nurses noted new letter per provider to extend time out work through November 2022.      "For claim [id number], nothing has been sent to or received from Dr. Geralyn Flash office since July 2022.  Currently no new claims being processed .  Noted and will call office if new claim develops."    No further activity needed or performed by this nurse at this time.

## 2020-09-15 ENCOUNTER — Other Ambulatory Visit: Payer: Self-pay

## 2020-09-15 ENCOUNTER — Ambulatory Visit: Payer: Managed Care, Other (non HMO) | Admitting: Physical Therapy

## 2020-09-15 ENCOUNTER — Encounter: Payer: Self-pay | Admitting: Physical Therapy

## 2020-09-15 DIAGNOSIS — Z483 Aftercare following surgery for neoplasm: Secondary | ICD-10-CM

## 2020-09-15 DIAGNOSIS — Z170 Estrogen receptor positive status [ER+]: Secondary | ICD-10-CM

## 2020-09-15 DIAGNOSIS — R293 Abnormal posture: Secondary | ICD-10-CM

## 2020-09-15 DIAGNOSIS — Z5112 Encounter for antineoplastic immunotherapy: Secondary | ICD-10-CM | POA: Diagnosis not present

## 2020-09-15 DIAGNOSIS — M25611 Stiffness of right shoulder, not elsewhere classified: Secondary | ICD-10-CM

## 2020-09-15 NOTE — Therapy (Signed)
[place], [place], [address] Phone: [phone]   Fax:  [phone]  Physical Therapy Treatment  Patient Details  Name: Sabrina Woods MRN: [id number] Date of Birth: 06-Jan-1955 Referring Provider (PT): Lindi Adie   Encounter Date: 09/15/2020   PT End of Session - 09/15/20 0857     Visit Number 4    Number of Visits 9    Date for PT Re-Evaluation 10/07/20    PT Start Time 0805    PT Stop Time 0853    PT Time Calculation (min) 48 min    Activity Tolerance Patient tolerated treatment well    Behavior During Therapy [facility] for tasks assessed/performed             Past Medical History:  Diagnosis Date   Acid reflux    Anxiety    Breast cancer ([place])    Breast cancer ([place]) 2022   Depression    HISTORY OF   Fibroid    PONV (postoperative nausea and vomiting)    Smoker     Past Surgical History:  Procedure Laterality Date   CESAREAN SECTION     X 2   MASTECTOMY WITH AXILLARY LYMPH NODE DISSECTION Right 08/04/2020   Procedure: RIGHT MASTECTOMY WITH RIGHT AXILLARY LYMPH NODE DISSECTION;  Surgeon: Rolm Bookbinder, MD;  Location: [facility];  Service: General;  Laterality: Right;   PORTACATH PLACEMENT Left 03/17/2020   Procedure: INSERTION PORT-A-CATH;  Surgeon: Rolm Bookbinder, MD;  Location: [facility];  Service: General;  Laterality: Left;    There were no vitals filed for this visit.   Subjective Assessment - 09/15/20 0805     Subjective I have the first radiation on Thursday. I think my ROM improves every day.    Pertinent History Pt palpated a lump in her right breast 6 months ago.  She was seen by her gynecologist who ordered Mammogram/ [place].  Cancer is located in the upper-outer quadrant of the right breast and is ER+.  She is presently having neoadjuvant chemo, and will have radiation following surgery and anti-estrogen.    Patient Stated Goals to try to get back to normal     Currently in Pain? No/denies    Pain Score 0-No pain                               [facility] Adult PT Treatment/Exercise - 09/15/20 0001       Shoulder Exercises: Therapy Ball   Flexion 10 reps      Manual Therapy   Myofascial Release MFR to right axillary and medial Upper arm area of cording with numerous cords palpable in this area that became less palpable following myofascial release    Passive ROM PROM right shoulder flex, scaption, abd, ER with VC's to pt to relax                          PT Long Term Goals - 08/26/20 1457       PT LONG TERM GOAL #1   Title Pts post surgical shoulder ROM and function will be equal to pre-surgical ROM    Time 22    Period Weeks    Status On-going      PT LONG TERM GOAL #2   Title Pt will demonstrate 150 degrees of R shoulder abduction with no end range tightness across R pec to  allow pt to return to PLOF    Baseline 145    Time 4    Period Weeks    Status New    Target Date 10/07/20      PT LONG TERM GOAL #3   Title Pt will demonstrate 90 degrees of R shoulder external rotation to allow her to return to PLOF.    Baseline 77    Time 4    Period Weeks    Status New    Target Date 10/07/20      PT LONG TERM GOAL #4   Title Pt will be independent in a home exercise program for long term stretching and strengthening    Time 6    Period Weeks    Status New    Target Date 09/23/20      PT LONG TERM GOAL #5   Title Pt will be able to verbalize lymphedema risk reduction practices    Time 6    Period Weeks    Status New    Target Date 10/07/20                   Plan - 09/15/20 0858     Clinical Impression Statement Pt followed up with her doctor regarding the red spot and he thought it looked ok. There was still a red spot under her arm with scab intact which pt feels is caused by her compression bra rubbing this area. Cut 1/2 grey foam and placed in thick stockinette for pt to wear in  her bra extending to her axilla to protect her skin from irritation. Educated pt to reach out to her surgeon to see if she has to continue to wear compression bra. Continued with PROM to R shoulder in all directions and myofascial release to reduce cording in R axilla. Pt felt improvement at end of session today.    PT Frequency 2x / week    PT Duration 4 weeks    PT Treatment/Interventions Patient/family education;ADLs/Self Care Home Management;Manual techniques;Manual lymph drainage;Scar mobilization;Passive range of motion;Taping;Therapeutic exercise    PT Next Visit Plan MFR cording R axilla, assess skin integrity, schedule for ABC class, continue ROM to R shoulder in to flexion and abduction and R pec stretching    PT Home Exercise Plan post op breast exercises, dowel exercises    Consulted and Agree with Plan of Care Patient             Patient will benefit from skilled therapeutic intervention in order to improve the following deficits and impairments:  Decreased knowledge of precautions, Postural dysfunction, Decreased range of motion, Decreased scar mobility, Pain, Impaired UE functional use, Increased fascial restricitons  Visit Diagnosis: Stiffness of right shoulder, not elsewhere classified  Aftercare following surgery for neoplasm  Abnormal posture  Malignant neoplasm of upper-outer quadrant of right breast in female, estrogen receptor positive ([place])     Problem List Patient Active Problem List   Diagnosis Date Noted   S/P mastectomy, right 08/04/2020   Chemotherapy-induced peripheral neuropathy ([place]) 05/20/2020   Port-A-Cath in place 03/19/2020   Malignant neoplasm of upper-outer quadrant of right breast in female, estrogen receptor positive ([place]) 03/10/2020   Fibroid    Smoker    Depression     Sabrina Woods, PT 09/15/2020, 9:01 AM  [place], [place], [address] Phone:  [phone]   Fax:  [phone]  Name: Sabrina Woods MRN: [id number] Date of Birth:  12/03/1955  Manus Gunning, PT 09/15/20 9:02 AM

## 2020-09-16 DIAGNOSIS — Z5112 Encounter for antineoplastic immunotherapy: Secondary | ICD-10-CM | POA: Diagnosis not present

## 2020-09-17 ENCOUNTER — Ambulatory Visit: Payer: Managed Care, Other (non HMO)

## 2020-09-17 ENCOUNTER — Ambulatory Visit
Admission: RE | Admit: 2020-09-17 | Discharge: 2020-09-17 | Disposition: A | Discharge status: Home / Self Care | Payer: Managed Care, Other (non HMO) | Source: Ambulatory Visit | Attending: Radiation Oncology | Admitting: Radiation Oncology

## 2020-09-17 ENCOUNTER — Other Ambulatory Visit: Payer: Self-pay

## 2020-09-17 DIAGNOSIS — Z170 Estrogen receptor positive status [ER+]: Secondary | ICD-10-CM

## 2020-09-17 DIAGNOSIS — R293 Abnormal posture: Secondary | ICD-10-CM

## 2020-09-17 DIAGNOSIS — Z5112 Encounter for antineoplastic immunotherapy: Secondary | ICD-10-CM | POA: Diagnosis not present

## 2020-09-17 DIAGNOSIS — Z483 Aftercare following surgery for neoplasm: Secondary | ICD-10-CM

## 2020-09-17 DIAGNOSIS — C50411 Malignant neoplasm of upper-outer quadrant of right female breast: Secondary | ICD-10-CM

## 2020-09-17 DIAGNOSIS — M25611 Stiffness of right shoulder, not elsewhere classified: Secondary | ICD-10-CM

## 2020-09-17 NOTE — Therapy (Signed)
[place], [place], [address] Phone: [phone]   Fax:  [phone]  Physical Therapy Treatment  Patient Details  Name: Sabrina Woods MRN: [id number] Date of Birth: 1955-03-01 Referring Provider (PT): Lindi Adie   Encounter Date: 09/17/2020   PT End of Session - 09/17/20 0909     Visit Number 5    Number of Visits 9    Date for PT Re-Evaluation 10/07/20    PT Start Time 0905    PT Stop Time 0953    PT Time Calculation (min) 48 min    Activity Tolerance Patient tolerated treatment well    Behavior During Therapy [facility] for tasks assessed/performed             Past Medical History:  Diagnosis Date   Acid reflux    Anxiety    Breast cancer ([place])    Breast cancer ([place]) 2022   Depression    HISTORY OF   Fibroid    PONV (postoperative nausea and vomiting)    Smoker     Past Surgical History:  Procedure Laterality Date   CESAREAN SECTION     X 2   MASTECTOMY WITH AXILLARY LYMPH NODE DISSECTION Right 08/04/2020   Procedure: RIGHT MASTECTOMY WITH RIGHT AXILLARY LYMPH NODE DISSECTION;  Surgeon: Rolm Bookbinder, MD;  Location: [facility];  Service: General;  Laterality: Right;   PORTACATH PLACEMENT Left 03/17/2020   Procedure: INSERTION PORT-A-CATH;  Surgeon: Rolm Bookbinder, MD;  Location: [facility];  Service: General;  Laterality: Left;    There were no vitals filed for this visit.   Subjective Assessment - 09/17/20 0902     Subjective Everything is going well.  I start radiation today at 11;15. Compliant with exs at home, but didn't get the pulley yet.  We will order today. My shoulder is less stiff everyday. I can reach further into the cabinets. Sometimes lifting things from the floor is hard.    Pertinent History Pt palpated a lump in her right breast 6 months ago.  She was seen by her gynecologist who ordered Mammogram/ [place].  Cancer is located in the upper-outer quadrant of  the right breast and is ER+.  She is presently having neoadjuvant chemo, and will have radiation following surgery and anti-estrogen.    Patient Stated Goals to try to get back to normal    Currently in Pain? No/denies    Pain Score 0-No pain                               [facility] Adult PT Treatment/Exercise - 09/17/20 0001       Shoulder Exercises: Supine   Other Supine Exercises AROM shoulder flexion, scaption, horizontal abduction x 5 ea      Shoulder Exercises: Pulleys   Flexion 2 minutes    Scaption 1 minute    ABduction 2 minutes      Shoulder Exercises: Therapy Ball   Flexion 10 reps    ABduction 10 reps     Manual Therapy   Myofascial Release MFR to right axillary and medial Upper arm area of cording with numerous cords still palpable    Manual Lymphatic Drainage (MLD) right supraclavicular, right axillary and right inguinal LN's, right axillo-inguinal pathway and right chest inferior to axillary incision toward pathway, retracing pathway and ending with LN's    Passive ROM PROM right shoulder flex, scaption, abd, ER with  VC's to pt to relax                          PT Long Term Goals - 08/26/20 1457       PT LONG TERM GOAL #1   Title Pts post surgical shoulder ROM and function will be equal to pre-surgical ROM    Time 22    Period Weeks    Status On-going      PT LONG TERM GOAL #2   Title Pt will demonstrate 150 degrees of R shoulder abduction with no end range tightness across R pec to allow pt to return to PLOF    Baseline 145    Time 4    Period Weeks    Status New    Target Date 10/07/20      PT LONG TERM GOAL #3   Title Pt will demonstrate 90 degrees of R shoulder external rotation to allow her to return to PLOF.    Baseline 77    Time 4    Period Weeks    Status New    Target Date 10/07/20      PT LONG TERM GOAL #4   Title Pt will be independent in a home exercise program for long term stretching and strengthening     Time 6    Period Weeks    Status New    Target Date 09/23/20      PT LONG TERM GOAL #5   Title Pt will be able to verbalize lymphedema risk reduction practices    Time 6    Period Weeks    Status New    Target Date 10/07/20                   Plan - 09/17/20 0909     Clinical Impression Statement Continued MFR techniques to cording, AAROM and AROM of right shoulder, PROM of right shoulder.  Initiated MLD to area of swelling inferior to mastectomy incision.  Suggested pt run a gentle stream of water on steri strips for them to fall off. Cording still very palpable but not interfering with ROM as much.  Pt is compliant with HEP and compression bra with foam.Pt starts radiation today.    Personal Factors and Comorbidities Age    Stability/Clinical Decision Making Stable/Uncomplicated    Rehab Potential Excellent    PT Frequency 2x / week    PT Duration 4 weeks    PT Treatment/Interventions Patient/family education;ADLs/Self Care Home Management;Manual techniques;Manual lymph drainage;Scar mobilization;Passive range of motion;Taping;Therapeutic exercise    PT Next Visit Plan MFR cording R axilla, assess skin integrity,  continue ROM to R shoulder in to flexion and abduction and R pec stretching    PT Home Exercise Plan post op breast exercises, dowel exercises    Consulted and Agree with Plan of Care Patient             Patient will benefit from skilled therapeutic intervention in order to improve the following deficits and impairments:  Decreased knowledge of precautions, Postural dysfunction, Decreased range of motion, Decreased scar mobility, Pain, Impaired UE functional use, Increased fascial restricitons  Visit Diagnosis: Stiffness of right shoulder, not elsewhere classified  Aftercare following surgery for neoplasm  Abnormal posture  Malignant neoplasm of upper-outer quadrant of right breast in female, estrogen receptor positive ([place])     Problem  List Patient Active Problem List   Diagnosis Date Noted   S/P mastectomy,  right 08/04/2020   Chemotherapy-induced peripheral neuropathy ([facility]) 05/20/2020   Port-A-Cath in place 03/19/2020   Malignant neoplasm of upper-outer quadrant of right breast in female, estrogen receptor positive ([place]) 03/10/2020   Fibroid    Smoker    Depression     Claris Pong, PT 09/17/2020, 9:58 AM  [place], [place], [address] Phone: [phone]   Fax:  [phone]  Name: LINSAY ARNO MRN: [id number] Date of Birth: 1955/06/18

## 2020-09-18 ENCOUNTER — Ambulatory Visit
Admission: RE | Admit: 2020-09-18 | Discharge: 2020-09-18 | Disposition: A | Discharge status: Home / Self Care | Payer: Managed Care, Other (non HMO) | Source: Ambulatory Visit | Attending: Radiation Oncology | Admitting: Radiation Oncology

## 2020-09-18 DIAGNOSIS — Z5112 Encounter for antineoplastic immunotherapy: Secondary | ICD-10-CM | POA: Diagnosis not present

## 2020-09-18 DIAGNOSIS — C50411 Malignant neoplasm of upper-outer quadrant of right female breast: Secondary | ICD-10-CM

## 2020-09-18 MED ORDER — ALRA NON-METALLIC DEODORANT (RAD-ONC)
1.0000 "application " | Freq: Once | TOPICAL | Status: AC
  Administered 2020-09-18: 1 via TOPICAL

## 2020-09-18 MED ORDER — RADIAPLEXRX EX GEL: Freq: Once | CUTANEOUS | Status: AC

## 2020-09-18 NOTE — Progress Notes (Signed)
Pt here for patient teaching.    Pt given Radiation and You booklet, skin care instructions, [place]ra deodorant, and Radiaplex gel.    Reviewed areas of pertinence such as fatigue, hair loss, skin changes, breast tenderness, and breast swelling .   Pt able to give teach back of to pat skin, use unscented/gentle soap, and use baby wipes,apply Radiaplex bid, avoid applying anything to skin within 4 hours of treatment, avoid wearing an under wire bra, and to use an electric razor if they must shave.   Pt verbalizes understanding of information given and will contact nursing with any questions or concerns.    [email or web address]

## 2020-09-21 ENCOUNTER — Other Ambulatory Visit: Payer: Self-pay | Admitting: Hematology and Oncology

## 2020-09-21 ENCOUNTER — Ambulatory Visit
Admission: RE | Admit: 2020-09-21 | Discharge: 2020-09-21 | Disposition: A | Discharge status: Home / Self Care | Payer: Managed Care, Other (non HMO) | Source: Ambulatory Visit | Attending: Radiation Oncology | Admitting: Radiation Oncology

## 2020-09-21 ENCOUNTER — Other Ambulatory Visit: Payer: Self-pay

## 2020-09-21 DIAGNOSIS — Z5112 Encounter for antineoplastic immunotherapy: Secondary | ICD-10-CM | POA: Diagnosis not present

## 2020-09-22 ENCOUNTER — Encounter: Payer: Self-pay | Admitting: Rehabilitation

## 2020-09-22 ENCOUNTER — Ambulatory Visit: Payer: Managed Care, Other (non HMO) | Admitting: Rehabilitation

## 2020-09-22 ENCOUNTER — Ambulatory Visit
Admission: RE | Admit: 2020-09-22 | Discharge: 2020-09-22 | Disposition: A | Discharge status: Home / Self Care | Payer: Managed Care, Other (non HMO) | Source: Ambulatory Visit | Attending: Radiation Oncology | Admitting: Radiation Oncology

## 2020-09-22 ENCOUNTER — Other Ambulatory Visit: Payer: Self-pay | Admitting: Hematology and Oncology

## 2020-09-22 DIAGNOSIS — Z5112 Encounter for antineoplastic immunotherapy: Secondary | ICD-10-CM | POA: Diagnosis not present

## 2020-09-22 DIAGNOSIS — C50411 Malignant neoplasm of upper-outer quadrant of right female breast: Secondary | ICD-10-CM

## 2020-09-22 DIAGNOSIS — Z483 Aftercare following surgery for neoplasm: Secondary | ICD-10-CM

## 2020-09-22 DIAGNOSIS — Z170 Estrogen receptor positive status [ER+]: Secondary | ICD-10-CM

## 2020-09-22 DIAGNOSIS — R293 Abnormal posture: Secondary | ICD-10-CM

## 2020-09-22 DIAGNOSIS — M25611 Stiffness of right shoulder, not elsewhere classified: Secondary | ICD-10-CM

## 2020-09-22 MED ORDER — NICOTINE 14 MG/24HR TD PT24: 14.0000 mg | MEDICATED_PATCH | Freq: Every day | TRANSDERMAL | 0 refills | Status: DC

## 2020-09-22 MED ORDER — ALPRAZOLAM 0.25 MG PO TABS: 0.2500 mg | ORAL_TABLET | Freq: Two times a day (BID) | ORAL | 3 refills | Status: DC | PRN

## 2020-09-22 MED ORDER — GABAPENTIN 300 MG PO CAPS: 300.0000 mg | ORAL_CAPSULE | Freq: Two times a day (BID) | ORAL | 3 refills | Status: DC

## 2020-09-22 NOTE — Therapy (Signed)
[place], [place], [address] Phone: [phone]   Fax:  [phone]  Physical Therapy Treatment  Patient Details  Name: Sabrina Woods MRN: [id number] Date of Birth: 05-02-1955 Referring Provider (PT): Lindi Adie   Encounter Date: 09/22/2020   PT End of Session - 09/22/20 1542     Visit Number 6    Number of Visits 9    Date for PT Re-Evaluation 10/07/20    PT Start Time 1500    PT Stop Time 9381    PT Time Calculation (min) 43 min    Activity Tolerance Patient tolerated treatment well    Behavior During Therapy [facility] for tasks assessed/performed             Past Medical History:  Diagnosis Date   Acid reflux    Anxiety    Breast cancer ([place])    Breast cancer ([place]) 2022   Depression    HISTORY OF   Fibroid    PONV (postoperative nausea and vomiting)    Smoker     Past Surgical History:  Procedure Laterality Date   CESAREAN SECTION     X 2   MASTECTOMY WITH AXILLARY LYMPH NODE DISSECTION Right 08/04/2020   Procedure: RIGHT MASTECTOMY WITH RIGHT AXILLARY LYMPH NODE DISSECTION;  Surgeon: Rolm Bookbinder, MD;  Location: [facility];  Service: General;  Laterality: Right;   PORTACATH PLACEMENT Left 03/17/2020   Procedure: INSERTION PORT-A-CATH;  Surgeon: Rolm Bookbinder, MD;  Location: [facility];  Service: General;  Laterality: Left;    There were no vitals filed for this visit.   Subjective Assessment - 09/22/20 1501     Subjective The radiation position was fine    Pertinent History Pt palpated a lump in her right breast 6 months ago.  She was seen by her gynecologist who ordered Mammogram/ [place].  Cancer is located in the upper-outer quadrant of the right breast and is ER+.  She is presently having neoadjuvant chemo, and will have radiation following surgery and anti-estrogen.    Patient Stated Goals to try to get back to normal    Currently in Pain? No/denies                                [facility] Adult PT Treatment/Exercise - 09/22/20 0001       Shoulder Exercises: Pulleys   Flexion 2 minutes    ABduction 2 minutes      Shoulder Exercises: Therapy Ball   Flexion 10 reps      Manual Therapy   Myofascial Release MFR to right axillary and medial Upper arm area of cording with numerous cords still palpable    Manual Lymphatic Drainage (MLD) right supraclavicular, right axillary and right inguinal LN's, right axillo-inguinal pathway and right chest inferior to axillary incision toward pathway, retracing pathway and ending with LN's    Passive ROM PROM right shoulder flex, scaption, abd, ER with VC's to pt to relax                          PT Long Term Goals - 08/26/20 1457       PT LONG TERM GOAL #1   Title Pts post surgical shoulder ROM and function will be equal to pre-surgical ROM    Time 22    Period Weeks    Status On-going  PT LONG TERM GOAL #2   Title Pt will demonstrate 150 degrees of R shoulder abduction with no end range tightness across R pec to allow pt to return to PLOF    Baseline 145    Time 4    Period Weeks    Status New    Target Date 10/07/20      PT LONG TERM GOAL #3   Title Pt will demonstrate 90 degrees of R shoulder external rotation to allow her to return to PLOF.    Baseline 77    Time 4    Period Weeks    Status New    Target Date 10/07/20      PT LONG TERM GOAL #4   Title Pt will be independent in a home exercise program for long term stretching and strengthening    Time 6    Period Weeks    Status New    Target Date 09/23/20      PT LONG TERM GOAL #5   Title Pt will be able to verbalize lymphedema risk reduction practices    Time 6    Period Weeks    Status New    Target Date 10/07/20                   Plan - 09/22/20 1543     Clinical Impression Statement Pt doing very well.  Tightness lateral incision where pt reports some pectoralis muscle was  removed.  Cords still very palpable but not very limiting.    PT Frequency 2x / week    PT Duration 4 weeks    PT Treatment/Interventions Patient/family education;ADLs/Self Care Home Management;Manual techniques;Manual lymph drainage;Scar mobilization;Passive range of motion;Taping;Therapeutic exercise    Consulted and Agree with Plan of Care Patient             Patient will benefit from skilled therapeutic intervention in order to improve the following deficits and impairments:  Decreased knowledge of precautions, Postural dysfunction, Decreased range of motion, Decreased scar mobility, Pain, Impaired UE functional use, Increased fascial restricitons  Visit Diagnosis: Stiffness of right shoulder, not elsewhere classified  Aftercare following surgery for neoplasm  Malignant neoplasm of upper-outer quadrant of right breast in female, estrogen receptor positive ([place])  Abnormal posture     Problem List Patient Active Problem List   Diagnosis Date Noted   S/P mastectomy, right 08/04/2020   Chemotherapy-induced peripheral neuropathy ([place]) 05/20/2020   Port-A-Cath in place 03/19/2020   Malignant neoplasm of upper-outer quadrant of right breast in female, estrogen receptor positive ([place]) 03/10/2020   Fibroid    Smoker    Depression     Stark Bray, PT 09/22/2020, 3:45 PM  [place], [place], [address] Phone: [phone]   Fax:  [phone]  Name: MAIDA WIDGER MRN: [id number] Date of Birth: 1955/10/02

## 2020-09-23 ENCOUNTER — Ambulatory Visit
Admission: RE | Admit: 2020-09-23 | Discharge: 2020-09-23 | Disposition: A | Discharge status: Home / Self Care | Payer: Managed Care, Other (non HMO) | Source: Ambulatory Visit | Attending: Radiation Oncology | Admitting: Radiation Oncology

## 2020-09-23 ENCOUNTER — Other Ambulatory Visit: Payer: Self-pay

## 2020-09-23 DIAGNOSIS — Z5112 Encounter for antineoplastic immunotherapy: Secondary | ICD-10-CM | POA: Diagnosis not present

## 2020-09-24 ENCOUNTER — Ambulatory Visit
Admission: RE | Admit: 2020-09-24 | Discharge: 2020-09-24 | Disposition: A | Discharge status: Home / Self Care | Payer: Managed Care, Other (non HMO) | Source: Ambulatory Visit | Attending: Radiation Oncology | Admitting: Radiation Oncology

## 2020-09-24 ENCOUNTER — Encounter: Payer: Self-pay | Admitting: Physical Therapy

## 2020-09-24 ENCOUNTER — Ambulatory Visit: Payer: Managed Care, Other (non HMO) | Admitting: Physical Therapy

## 2020-09-24 DIAGNOSIS — Z5112 Encounter for antineoplastic immunotherapy: Secondary | ICD-10-CM | POA: Diagnosis not present

## 2020-09-24 DIAGNOSIS — Z170 Estrogen receptor positive status [ER+]: Secondary | ICD-10-CM

## 2020-09-24 DIAGNOSIS — M25611 Stiffness of right shoulder, not elsewhere classified: Secondary | ICD-10-CM

## 2020-09-24 DIAGNOSIS — Z483 Aftercare following surgery for neoplasm: Secondary | ICD-10-CM

## 2020-09-24 DIAGNOSIS — C50411 Malignant neoplasm of upper-outer quadrant of right female breast: Secondary | ICD-10-CM

## 2020-09-24 DIAGNOSIS — R293 Abnormal posture: Secondary | ICD-10-CM

## 2020-09-24 NOTE — Therapy (Signed)
[place], [place], [address] Phone: [phone]   Fax:  [phone]  Physical Therapy Treatment  Patient Details  Name: Sabrina Woods MRN: [id number] Date of Birth: 12-Nov-1955 Referring Provider (PT): Lindi Adie   Encounter Date: 09/24/2020   PT End of Session - 09/24/20 0954     Visit Number 7    Number of Visits 9    Date for PT Re-Evaluation 10/07/20    PT Start Time 0905    PT Stop Time 0951    PT Time Calculation (min) 46 min    Activity Tolerance Patient tolerated treatment well    Behavior During Therapy [facility] for tasks assessed/performed             Past Medical History:  Diagnosis Date   Acid reflux    Anxiety    Breast cancer ([place])    Breast cancer ([place]) 2022   Depression    HISTORY OF   Fibroid    PONV (postoperative nausea and vomiting)    Smoker     Past Surgical History:  Procedure Laterality Date   CESAREAN SECTION     X 2   MASTECTOMY WITH AXILLARY LYMPH NODE DISSECTION Right 08/04/2020   Procedure: RIGHT MASTECTOMY WITH RIGHT AXILLARY LYMPH NODE DISSECTION;  Surgeon: Rolm Bookbinder, MD;  Location: [facility];  Service: General;  Laterality: Right;   PORTACATH PLACEMENT Left 03/17/2020   Procedure: INSERTION PORT-A-CATH;  Surgeon: Rolm Bookbinder, MD;  Location: [facility];  Service: General;  Laterality: Left;    There were no vitals filed for this visit.   Subjective Assessment - 09/24/20 0905     Subjective My ROM Is getting better and my swelling is getting better. I have started radiation.    Pertinent History Pt palpated a lump in her right breast 6 months ago.  She was seen by her gynecologist who ordered Mammogram/ [place].  Cancer is located in the upper-outer quadrant of the right breast and is ER+.  She is presently having neoadjuvant chemo, and will have radiation following surgery and anti-estrogen.    Patient Stated Goals to try to get back to  normal    Currently in Pain? No/denies    Pain Score 0-No pain                               [facility] Adult PT Treatment/Exercise - 09/24/20 0001       Shoulder Exercises: Therapy Ball   Flexion 10 reps    ABduction 10 reps;Right      Manual Therapy   Manual Therapy Soft tissue mobilization    Soft tissue mobilization gently along healed area of mastectomy scar and to R pec to decrease tightness    Myofascial Release MFR to R axilla    Manual Lymphatic Drainage (MLD) short neck, 5 diaphragmatic breaths, right inguinal nodes and establishment of axillo inguinal pathway working on right inferior chest and lateral trunk moving fluid towards pathway    Passive ROM PROM right shoulder flex, scaption, abd, ER with VC's to pt to relax                          PT Long Term Goals - 08/26/20 1457       PT LONG TERM GOAL #1   Title Pts post surgical shoulder ROM and function will be equal to pre-surgical  ROM    Time 22    Period Weeks    Status On-going      PT LONG TERM GOAL #2   Title Pt will demonstrate 150 degrees of R shoulder abduction with no end range tightness across R pec to allow pt to return to PLOF    Baseline 145    Time 4    Period Weeks    Status New    Target Date 10/07/20      PT LONG TERM GOAL #3   Title Pt will demonstrate 90 degrees of R shoulder external rotation to allow her to return to PLOF.    Baseline 77    Time 4    Period Weeks    Status New    Target Date 10/07/20      PT LONG TERM GOAL #4   Title Pt will be independent in a home exercise program for long term stretching and strengthening    Time 6    Period Weeks    Status New    Target Date 09/23/20      PT LONG TERM GOAL #5   Title Pt will be able to verbalize lymphedema risk reduction practices    Time 6    Period Weeks    Status New    Target Date 10/07/20                   Plan - 09/24/20 0955     Clinical Impression Statement Pt  continuing to progress with ROM and swelling. Pt still having increased tightness across mastectomy scar with abduction. Worked today doing soft tissue mobilization gently across scar to help decrease tightness. Cording less palpable today. Continued with myofascial release to R axilla. Pt reports her swelling is improving but there is still fullness inferior to mastectomy scar and axilla so continued MLD today.    PT Frequency 2x / week    PT Duration 4 weeks    PT Treatment/Interventions Patient/family education;ADLs/Self Care Home Management;Manual techniques;Manual lymph drainage;Scar mobilization;Passive range of motion;Taping;Therapeutic exercise    PT Next Visit Plan does pt need to schedule more appts? -MFR cording R axilla, assess skin integrity,  continue ROM to R shoulder in to flexion and abduction and R pec stretching    PT Home Exercise Plan post op breast exercises, dowel exercises    Consulted and Agree with Plan of Care Patient             Patient will benefit from skilled therapeutic intervention in order to improve the following deficits and impairments:  Decreased knowledge of precautions, Postural dysfunction, Decreased range of motion, Decreased scar mobility, Pain, Impaired UE functional use, Increased fascial restricitons  Visit Diagnosis: Stiffness of right shoulder, not elsewhere classified  Aftercare following surgery for neoplasm  Abnormal posture  Malignant neoplasm of upper-outer quadrant of right breast in female, estrogen receptor positive ([place])     Problem List Patient Active Problem List   Diagnosis Date Noted   S/P mastectomy, right 08/04/2020   Chemotherapy-induced peripheral neuropathy ([place]) 05/20/2020   Port-A-Cath in place 03/19/2020   Malignant neoplasm of upper-outer quadrant of right breast in female, estrogen receptor positive ([place]) 03/10/2020   Fibroid    Smoker    Depression     Allyson Sabal Bogard, PT 09/24/2020, 9:57 AM  [place], [place], [address] Phone: [phone]   Fax:  [phone]  Name: Sabrina Woods MRN: [id number]  Date of Birth: [birth year]   Allyson Sabal Laurel, PT 09/24/20 9:57 AM

## 2020-09-25 ENCOUNTER — Ambulatory Visit
Admission: RE | Admit: 2020-09-25 | Discharge: 2020-09-25 | Disposition: A | Discharge status: Home / Self Care | Payer: Managed Care, Other (non HMO) | Source: Ambulatory Visit | Attending: Radiation Oncology | Admitting: Radiation Oncology

## 2020-09-25 ENCOUNTER — Other Ambulatory Visit: Payer: Self-pay

## 2020-09-25 DIAGNOSIS — Z5112 Encounter for antineoplastic immunotherapy: Secondary | ICD-10-CM | POA: Diagnosis not present

## 2020-09-28 ENCOUNTER — Ambulatory Visit
Admission: RE | Admit: 2020-09-28 | Discharge: 2020-09-28 | Disposition: A | Discharge status: Home / Self Care | Payer: Managed Care, Other (non HMO) | Source: Ambulatory Visit | Attending: Radiation Oncology | Admitting: Radiation Oncology

## 2020-09-28 DIAGNOSIS — Z5112 Encounter for antineoplastic immunotherapy: Secondary | ICD-10-CM | POA: Diagnosis not present

## 2020-09-29 ENCOUNTER — Other Ambulatory Visit: Payer: Self-pay

## 2020-09-29 ENCOUNTER — Encounter: Payer: Self-pay | Admitting: Physical Therapy

## 2020-09-29 ENCOUNTER — Ambulatory Visit: Payer: Managed Care, Other (non HMO) | Admitting: Physical Therapy

## 2020-09-29 ENCOUNTER — Ambulatory Visit
Admission: RE | Admit: 2020-09-29 | Discharge: 2020-09-29 | Disposition: A | Discharge status: Home / Self Care | Payer: Managed Care, Other (non HMO) | Source: Ambulatory Visit | Attending: Radiation Oncology | Admitting: Radiation Oncology

## 2020-09-29 DIAGNOSIS — R293 Abnormal posture: Secondary | ICD-10-CM

## 2020-09-29 DIAGNOSIS — Z5112 Encounter for antineoplastic immunotherapy: Secondary | ICD-10-CM | POA: Diagnosis not present

## 2020-09-29 DIAGNOSIS — M25611 Stiffness of right shoulder, not elsewhere classified: Secondary | ICD-10-CM

## 2020-09-29 DIAGNOSIS — Z483 Aftercare following surgery for neoplasm: Secondary | ICD-10-CM

## 2020-09-29 DIAGNOSIS — C50411 Malignant neoplasm of upper-outer quadrant of right female breast: Secondary | ICD-10-CM

## 2020-09-29 DIAGNOSIS — Z170 Estrogen receptor positive status [ER+]: Secondary | ICD-10-CM

## 2020-09-29 NOTE — Therapy (Signed)
[place], [place], [address] Phone: [phone]   Fax:  [phone]  Physical Therapy Treatment  Patient Details  Name: Sabrina Woods MRN: [id number] Date of Birth: January 09, 1955 Referring Provider (PT): Lindi Adie   Encounter Date: 09/29/2020   PT End of Session - 09/29/20 0957     Visit Number 8    Number of Visits 9    Date for PT Re-Evaluation 10/07/20    PT Start Time 0905    PT Stop Time 9381    PT Time Calculation (min) 49 min    Activity Tolerance Patient tolerated treatment well    Behavior During Therapy [facility] for tasks assessed/performed             Past Medical History:  Diagnosis Date   Acid reflux    Anxiety    Breast cancer ([place])    Breast cancer ([place]) 2022   Depression    HISTORY OF   Fibroid    PONV (postoperative nausea and vomiting)    Smoker     Past Surgical History:  Procedure Laterality Date   CESAREAN SECTION     X 2   MASTECTOMY WITH AXILLARY LYMPH NODE DISSECTION Right 08/04/2020   Procedure: RIGHT MASTECTOMY WITH RIGHT AXILLARY LYMPH NODE DISSECTION;  Surgeon: Rolm Bookbinder, MD;  Location: [facility];  Service: General;  Laterality: Right;   PORTACATH PLACEMENT Left 03/17/2020   Procedure: INSERTION PORT-A-CATH;  Surgeon: Rolm Bookbinder, MD;  Location: [facility];  Service: General;  Laterality: Left;    There were no vitals filed for this visit.   Subjective Assessment - 09/29/20 0955     Subjective My ROM is getting better. I stopped wearing the compression bra but maybe I need to wear it more because I feel like I have a little more swelling on my side.    Pertinent History Pt palpated a lump in her right breast 6 months ago.  She was seen by her gynecologist who ordered Mammogram/ [place].  Cancer is located in the upper-outer quadrant of the right breast and is ER+.  She is presently having neoadjuvant chemo, and will have radiation following  surgery and anti-estrogen.    Patient Stated Goals to try to get back to normal    Currently in Pain? No/denies    Pain Score 0-No pain                               [facility] Adult PT Treatment/Exercise - 09/29/20 0001       Shoulder Exercises: Pulleys   Flexion 2 minutes    ABduction 2 minutes      Shoulder Exercises: Therapy Ball   Flexion 10 reps    ABduction 10 reps;Right      Manual Therapy   Soft tissue mobilization gently along healed area of mastectomy scar and to R pec to decrease tightness    Myofascial Release MFR to R axilla    Manual Lymphatic Drainage (MLD) right inguinal nodes and establishment of axillo inguinal pathway working on right inferior chest and lateral trunk moving fluid towards pathway    Passive ROM PROM to R shoulder in to flexion and abduction while working on [employer] to [place] Term Goals - 08/26/20 1457  PT LONG TERM GOAL #1   Title Pts post surgical shoulder ROM and function will be equal to pre-surgical ROM    Time 22    Period Weeks    Status On-going      PT LONG TERM GOAL #2   Title Pt will demonstrate 150 degrees of R shoulder abduction with no end range tightness across R pec to allow pt to return to PLOF    Baseline 145    Time 4    Period Weeks    Status New    Target Date 10/07/20      PT LONG TERM GOAL #3   Title Pt will demonstrate 90 degrees of R shoulder external rotation to allow her to return to PLOF.    Baseline 77    Time 4    Period Weeks    Status New    Target Date 10/07/20      PT LONG TERM GOAL #4   Title Pt will be independent in a home exercise program for long term stretching and strengthening    Time 6    Period Weeks    Status New    Target Date 09/23/20      PT LONG TERM GOAL #5   Title Pt will be able to verbalize lymphedema risk reduction practices    Time 6    Period Weeks    Status New    Target Date 10/07/20                    Plan - 09/29/20 0958     Clinical Impression Statement Pt continuing to feel less of stretch with PROM and AAROM exercises. Her AROM is improving. She continues to have some edema in R axilla and lateral trunk but edema inferior to mastectomy scar is improving. Continued with MLD and soft tissue mobilization to R pec to decrease tightness. WIll instruct pt in supine scapular strengthening exercises at next session.    PT Frequency 2x / week    PT Duration 4 weeks    PT Treatment/Interventions Patient/family education;ADLs/Self Care Home Management;Manual techniques;Manual lymph drainage;Scar mobilization;Passive range of motion;Taping;Therapeutic exercise    PT Next Visit Plan give supine scap exercises, does pt need to schedule more appts? -MFR cording R axilla, assess skin integrity,  continue ROM to R shoulder in to flexion and abduction and R pec stretching    PT Home Exercise Plan post op breast exercises, dowel exercises    Consulted and Agree with Plan of Care Patient             Patient will benefit from skilled therapeutic intervention in order to improve the following deficits and impairments:  Decreased knowledge of precautions, Postural dysfunction, Decreased range of motion, Decreased scar mobility, Pain, Impaired UE functional use, Increased fascial restricitons  Visit Diagnosis: Stiffness of right shoulder, not elsewhere classified  Aftercare following surgery for neoplasm  Abnormal posture  Malignant neoplasm of upper-outer quadrant of right breast in female, estrogen receptor positive ([place])     Problem List Patient Active Problem List   Diagnosis Date Noted   S/P mastectomy, right 08/04/2020   Chemotherapy-induced peripheral neuropathy ([place]) 05/20/2020   Port-A-Cath in place 03/19/2020   Malignant neoplasm of upper-outer quadrant of right breast in female, estrogen receptor positive ([place]) 03/10/2020   Fibroid    Smoker    Depression      Manus Gunning, PT 09/29/2020, 9:59 AM  [place]  [place], [place], [address] Phone: [phone]   Fax:  [phone]  Name: CALIN ELLERY MRN: [id number] Date of Birth: May 20, 1955  Manus Gunning, PT 09/29/20 10:00 AM

## 2020-09-30 ENCOUNTER — Other Ambulatory Visit: Payer: Self-pay

## 2020-09-30 ENCOUNTER — Ambulatory Visit
Admission: RE | Admit: 2020-09-30 | Discharge: 2020-09-30 | Disposition: A | Discharge status: Home / Self Care | Payer: Managed Care, Other (non HMO) | Source: Ambulatory Visit | Attending: Radiation Oncology | Admitting: Radiation Oncology

## 2020-09-30 DIAGNOSIS — Z5112 Encounter for antineoplastic immunotherapy: Secondary | ICD-10-CM | POA: Diagnosis not present

## 2020-09-30 NOTE — Progress Notes (Signed)
Patient Care Team: [name], MD as PCP - General (Hematology and Oncology) Mauro Kaufmann, RN as Oncology Nurse Navigator Rockwell Germany, RN as Oncology Nurse Navigator  DIAGNOSIS:    ICD-10-CM   1. Malignant neoplasm of upper-outer quadrant of right breast in female, estrogen receptor positive ([place])  C50.411 ECHOCARDIOGRAM COMPLETE   Z17.0       SUMMARY OF ONCOLOGIC HISTORY: Oncology History  Malignant neoplasm of upper-outer quadrant of right breast in female, estrogen receptor positive ([place])  03/10/2020 Initial Diagnosis   Palpable right breast mass x6 months.  Mammogram revealed left breast cysts (intraductal papilloma), 11 cm right breast mass and 2 masses 2.6 cm each in the right axilla: Biopsy grade 3 IDC ER 5% weak, PR 0%, Ki-67 25%, HER-2 3+ IHC positive   03/10/2020 Cancer Staging   Staging form: Breast, AJCC 8th Edition - Clinical stage from 03/10/2020: Stage IIIA (cT3, cN1, cM0, G3, ER+, PR-, HER2+) - Signed by [name], MD on 03/10/2020 Stage prefix: Initial diagnosis   03/19/2020 - 07/01/2020 Chemotherapy   TCH Perjeta        08/04/2020 Surgery   Right mastectomy (Dr. 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 with above-mentioned history of right breast cancer having undergone right mastectomy, currently on chemotherapy with Kadcyla today. She presents to the clinic today for treatment.   ALLERGIES:  has No Known Allergies.  MEDICATIONS:  Current Outpatient Medications  Medication Sig Dispense Refill   ALPRAZolam (XANAX) 0.25 MG tablet Take 1 tablet (0.25 mg total) by mouth 2 (two) times  daily as needed for anxiety. 60 tablet 3   esomeprazole (NEXIUM) 20 MG capsule Take 20 mg by mouth daily.     gabapentin (NEURONTIN) 300 MG capsule Take 1 capsule (300 mg total) by mouth 2 (two) times daily. 180 capsule 3   loratadine (CLARITIN) 10 MG tablet Take 10 mg by mouth daily as needed for rhinitis.     Multiple Vitamin (MULTIVITAMIN) capsule Take 1 capsule by mouth daily.     nicotine (NICODERM CQ - DOSED IN MG/24 HR) 7 mg/24hr patch PLACE 1 PATCH (7 MG TOTAL) ONTO THE SKIN DAILY. 28 patch 0   nicotine (NICODERM CQ) 14 mg/24hr patch Place 1 patch (14 mg total) onto the skin daily. 14 patch 0   No current facility-administered medications for this visit.    PHYSICAL EXAMINATION: ECOG PERFORMANCE STATUS: 1 - Symptomatic but completely ambulatory  Vitals:   10/02/20 0946  BP: (!) 156/79  Pulse: (!) 104  Resp: 18  Temp: 97.7 F (36.5 C)  SpO2: 98%   Filed Weights   10/02/20 0946  Weight: 199 lb 11.2 oz (90.6 kg)    LABORATORY DATA:  I have reviewed the data as listed CMP Latest Ref Rng & Units 09/11/2020 08/21/2020 08/05/2020  Glucose 70 - 99 mg/dL 192(H) 184(H) 215(H)  BUN 8 - 23 mg/dL $Remove'9 10 11  'kYgeUiJ$ Creatinine 0.44 - 1.00 mg/dL 0.83 0.90 0.65  Sodium 135 - 145 mmol/L 140 142 137  Potassium 3.5 - 5.1 mmol/L 3.6 3.9 3.8  Chloride 98 - 111 mmol/L 105 107 106  CO2 22 - 32 mmol/L $RemoveB'23 24 22  'xmhtKfiN$ Calcium 8.9 - 10.3 mg/dL 9.9 9.7 8.7(L)  Total Protein 6.5 - 8.1 g/dL 7.2 6.9 -  Total Bilirubin 0.3 - 1.2 mg/dL 0.4 0.3 -  Alkaline Phos 38 - 126 U/L 105 96 -  AST 15 - 41 U/L 22 14(L) -  ALT 0 - 44 U/L 22 15 -    Lab Results  Component Value Date   WBC 5.9 10/02/2020   HGB 12.7 10/02/2020   HCT 37.7 10/02/2020   MCV 91.3 10/02/2020   PLT 245 10/02/2020   NEUTROABS 3.9 10/02/2020    ASSESSMENT & PLAN:  Malignant neoplasm of upper-outer quadrant of right breast in female, estrogen receptor positive ([place]) 08/04/20: Right mastectomy (Dr. Rolm Bookbinder): grade 3, 8.5 cm invasive  ductal carcinoma with micropapillary features, high-grade DCIS with necrosis and calcifications, and 2 right axillary lymph nodes positive for metastatic carcinoma   Treatment Plan: 1. Neoadjuvant chemotherapy with TCH Perjeta 6 cycles followed by Kadcyla maintenance for 1 year 2. Followed by mastectomy with sentinel lymph node study 08/04/20 3. Followed by adjuvant radiation therapy 4.  Decided not to do antiestrogen therapy because her ER was 0% on final path. 5.  Consideration for neratinib ----------------------------------------------------------------------------------------------------------------- 03/15/20: Bone Scan: Subtle uptake in ribs: Non diagnostic 03/16/20: CT CAP: Large Rt breast mass with skin thickening, small sat lesion 1.8 cm, multiple enlarged LN, 4 mm LLL nodule ---------------------------------------------------------------------------------------------------------------- Current Treatment: Kadcyla q 3 weeks, today is cycle 3 Echocardiogram 06/29/2020: EF 65 to 70% we will obtain her next echocardiogram in a month. Adj XRT will start 09/18/2020   Kadcyla toxicities: Severe fatigue: Fatigue has finally started to subside Denies any nausea or vomiting.  She is able to eat regular food   RTC every 3 weeks    Orders Placed This Encounter  Procedures   ECHOCARDIOGRAM COMPLETE    Standing Status:   Future    Standing Expiration Date:   10/01/2021    Order Specific Question:   Where should this test be performed    Answer:   [place]    Order Specific Question:   Perflutren DEFINITY (image enhancing agent) should be administered unless hypersensitivity or allergy exist    Answer:   Administer Perflutren    Order Specific Question:   Is a special reader required? (athlete or structural heart)    Answer:   No    Order Specific Question:   Does this study need to be read by the Structural team/Level 3 readers?    Answer:   No    Order Specific Question:   Reason for  exam-Echo    Answer:   Chemo  Z09    Order Specific Question:   Other Comments    Answer:   on anti-her2 directed therapy with Kadcyla. Please contact patient with date once scheduled.  Thank you.   The patient has a good understanding of the overall plan. she agrees with it. she will call with any problems that may develop before the next visit here.  Total time spent: 30 mins including face to face time and time spent for planning, charting and coordination of care  Rulon Eisenmenger, MD, MPH 10/02/2020  I, Thana Ates, am acting as scribe for Dr. [name].  I have reviewed the above documentation for accuracy and completeness, and I agree with the above.

## 2020-10-01 ENCOUNTER — Ambulatory Visit: Payer: Managed Care, Other (non HMO) | Admitting: Physical Therapy

## 2020-10-01 ENCOUNTER — Ambulatory Visit
Admission: RE | Admit: 2020-10-01 | Discharge: 2020-10-01 | Disposition: A | Discharge status: Home / Self Care | Payer: Managed Care, Other (non HMO) | Source: Ambulatory Visit | Attending: Radiation Oncology | Admitting: Radiation Oncology

## 2020-10-01 ENCOUNTER — Encounter: Payer: Self-pay | Admitting: Physical Therapy

## 2020-10-01 ENCOUNTER — Other Ambulatory Visit: Payer: Self-pay

## 2020-10-01 DIAGNOSIS — Z483 Aftercare following surgery for neoplasm: Secondary | ICD-10-CM

## 2020-10-01 DIAGNOSIS — Z170 Estrogen receptor positive status [ER+]: Secondary | ICD-10-CM

## 2020-10-01 DIAGNOSIS — Z5112 Encounter for antineoplastic immunotherapy: Secondary | ICD-10-CM | POA: Diagnosis not present

## 2020-10-01 DIAGNOSIS — M25611 Stiffness of right shoulder, not elsewhere classified: Secondary | ICD-10-CM

## 2020-10-01 DIAGNOSIS — C50411 Malignant neoplasm of upper-outer quadrant of right female breast: Secondary | ICD-10-CM

## 2020-10-01 DIAGNOSIS — R293 Abnormal posture: Secondary | ICD-10-CM

## 2020-10-01 NOTE — Therapy (Signed)
[place], [place], [address] Phone: [phone]   Fax:  [phone]  Physical Therapy Treatment  Patient Details  Name: Sabrina Woods MRN: [id number] Date of Birth: 1955/11/27 Referring Provider (PT): Lindi Adie   Encounter Date: 10/01/2020   PT End of Session - 10/01/20 0955     Visit Number 9    Number of Visits 9    Date for PT Re-Evaluation 10/07/20    PT Start Time 0903    PT Stop Time 0951    PT Time Calculation (min) 48 min    Activity Tolerance Patient tolerated treatment well    Behavior During Therapy [facility] for tasks assessed/performed             Past Medical History:  Diagnosis Date   Acid reflux    Anxiety    Breast cancer ([place])    Breast cancer ([place]) 2022   Depression    HISTORY OF   Fibroid    PONV (postoperative nausea and vomiting)    Smoker     Past Surgical History:  Procedure Laterality Date   CESAREAN SECTION     X 2   MASTECTOMY WITH AXILLARY LYMPH NODE DISSECTION Right 08/04/2020   Procedure: RIGHT MASTECTOMY WITH RIGHT AXILLARY LYMPH NODE DISSECTION;  Surgeon: Rolm Bookbinder, MD;  Location: [place];  Service: General;  Laterality: Right;   PORTACATH PLACEMENT Left 03/17/2020   Procedure: INSERTION PORT-A-CATH;  Surgeon: Rolm Bookbinder, MD;  Location: [facility];  Service: General;  Laterality: Left;    There were no vitals filed for this visit.   Subjective Assessment - 10/01/20 0955     Subjective My ROM Is feeling better.    Pertinent History Pt palpated a lump in her right breast 6 months ago.  She was seen by her gynecologist who ordered Mammogram/ [place].  Cancer is located in the upper-outer quadrant of the right breast and is ER+.  She is presently having neoadjuvant chemo, and will have radiation following surgery and anti-estrogen.    Patient Stated Goals to try to get back to normal    Currently in Pain? No/denies    Pain Score 0-No  pain                               [facility] Adult PT Treatment/Exercise - 10/01/20 0001       Shoulder Exercises: Supine   Horizontal ABduction Strengthening;Both;10 reps   pt returned therapist demo   Theraband Level (Shoulder Horizontal ABduction) Level 2 (Red)    Horizontal ABduction Limitations pt was shaky due to weakness so decreased to yellow band    External Rotation Strengthening;Both;10 reps   pt returned therapist demo   Flexion Strengthening;Both;10 reps   narrow and wide grip; pt returned therapist demo   Theraband Level (Shoulder Flexion) Level 1 (Yellow)    Diagonals Strengthening;Both;10 reps   pt returned therapist demo   Theraband Level (Shoulder Diagonals) Level 1 (Yellow)      Shoulder Exercises: Pulleys   Flexion 2 minutes    ABduction 2 minutes      Shoulder Exercises: Therapy Ball   Flexion 10 reps    ABduction 10 reps;Right      Manual Therapy   Soft tissue mobilization gently along healed area of mastectomy scar and to R pec to decrease tightness    Myofascial Release MFR to R axilla  Passive ROM PROM to R shoulder in to flexion and abduction while working on STM to [place] - 08/26/20 1457       PT LONG TERM GOAL #1   Title Pts post surgical shoulder ROM and function will be equal to pre-surgical ROM    Time 22    Period Weeks    Status On-going      PT LONG TERM GOAL #2   Title Pt will demonstrate 150 degrees of R shoulder abduction with no end range tightness across R pec to allow pt to return to PLOF    Baseline 145    Time 4    Period Weeks    Status New    Target Date 10/07/20      PT LONG TERM GOAL #3   Title Pt will demonstrate 90 degrees of R shoulder external rotation to allow her to return to PLOF.    Baseline 77    Time 4    Period Weeks    Status New    Target Date 10/07/20      PT LONG TERM GOAL #4   Title Pt will be independent in a home  exercise program for long term stretching and strengthening    Time 6    Period Weeks    Status New    Target Date 09/23/20      PT LONG TERM GOAL #5   Title Pt will be able to verbalize lymphedema risk reduction practices    Time 6    Period Weeks    Status New    Target Date 10/07/20                   Plan - 10/01/20 0956     Clinical Impression Statement Began instructing pt in supine scapular strengthening exercises today. Attempted red band but this was too difficult so decreased to yellow band. Will issue this as part of HEP next session. Will continue with soft tissue mobilization and stretching to R shoulder to decrease tightness across R chest. Edema is continuing to improve.    PT Frequency 2x / week    PT Duration 4 weeks    PT Treatment/Interventions Patient/family education;ADLs/Self Care Home Management;Manual techniques;Manual lymph drainage;Scar mobilization;Passive range of motion;Taping;Therapeutic exercise    PT Next Visit Plan print off supine scap exercises nad issue to pt - make sure she has bands, does pt need to schedule more appts? -MFR cording R axilla, assess skin integrity,  continue ROM to R shoulder in to flexion and abduction and R pec stretching, give strength ABC prior to d/c    PT Home Exercise Plan post op breast exercises, dowel exercises    Consulted and Agree with Plan of Care Patient             Patient will benefit from skilled therapeutic intervention in order to improve the following deficits and impairments:  Decreased knowledge of precautions, Postural dysfunction, Decreased range of motion, Decreased scar mobility, Pain, Impaired UE functional use, Increased fascial restricitons  Visit Diagnosis: Stiffness of right shoulder, not elsewhere classified  Aftercare following surgery for neoplasm  Abnormal posture  Malignant neoplasm of upper-outer quadrant of right breast in female, estrogen receptor positive  [facility])     Problem List Patient Active Problem List   Diagnosis Date  Noted   S/P mastectomy, right 08/04/2020   Chemotherapy-induced peripheral neuropathy ([place]) 05/20/2020   Port-A-Cath in place 03/19/2020   Malignant neoplasm of upper-outer quadrant of right breast in female, estrogen receptor positive ([place]) 03/10/2020   Fibroid    Smoker    Depression     Allyson Sabal [place], PT 10/01/2020, 10:04 AM  [place], [place], [address] Phone: [phone]   Fax:  [phone]  Name: Sabrina Woods MRN: [id number] Date of Birth: 09-19-1955   Manus Gunning, PT 10/01/20 10:04 AM

## 2020-10-02 ENCOUNTER — Inpatient Hospital Stay: Payer: Managed Care, Other (non HMO)

## 2020-10-02 ENCOUNTER — Other Ambulatory Visit: Payer: Self-pay | Admitting: *Deleted

## 2020-10-02 ENCOUNTER — Encounter: Payer: Self-pay | Admitting: *Deleted

## 2020-10-02 ENCOUNTER — Ambulatory Visit
Admission: RE | Admit: 2020-10-02 | Discharge: 2020-10-02 | Disposition: A | Discharge status: Home / Self Care | Payer: Managed Care, Other (non HMO) | Source: Ambulatory Visit | Attending: Radiation Oncology | Admitting: Radiation Oncology

## 2020-10-02 ENCOUNTER — Inpatient Hospital Stay (HOSPITAL_BASED_OUTPATIENT_CLINIC_OR_DEPARTMENT_OTHER): Payer: Managed Care, Other (non HMO) | Admitting: Hematology and Oncology

## 2020-10-02 ENCOUNTER — Other Ambulatory Visit: Payer: Self-pay

## 2020-10-02 VITALS — BP 156/79 | HR 104 | Temp 97.7°F | Resp 18 | Ht 66.0 in | Wt 199.7 lb

## 2020-10-02 DIAGNOSIS — C50411 Malignant neoplasm of upper-outer quadrant of right female breast: Secondary | ICD-10-CM

## 2020-10-02 DIAGNOSIS — Z5181 Encounter for therapeutic drug level monitoring: Secondary | ICD-10-CM

## 2020-10-02 DIAGNOSIS — Z95828 Presence of other vascular implants and grafts: Secondary | ICD-10-CM

## 2020-10-02 DIAGNOSIS — Z170 Estrogen receptor positive status [ER+]: Secondary | ICD-10-CM | POA: Diagnosis not present

## 2020-10-02 DIAGNOSIS — Z5112 Encounter for antineoplastic immunotherapy: Secondary | ICD-10-CM | POA: Diagnosis not present

## 2020-10-02 LAB — CBC WITH DIFFERENTIAL (CANCER CENTER ONLY)
Abs Immature Granulocytes: 0.01 10*3/uL (ref 0.00–0.07)
Basophils Absolute: 0 10*3/uL (ref 0.0–0.1)
Basophils Relative: 1 %
Eosinophils Absolute: 0.1 10*3/uL (ref 0.0–0.5)
Eosinophils Relative: 2 %
HCT: 37.7 % (ref 36.0–46.0)
Hemoglobin: 12.7 g/dL (ref 12.0–15.0)
Immature Granulocytes: 0 %
Lymphocytes Relative: 23 %
Lymphs Abs: 1.4 10*3/uL (ref 0.7–4.0)
MCH: 30.8 pg (ref 26.0–34.0)
MCHC: 33.7 g/dL (ref 30.0–36.0)
MCV: 91.3 fL (ref 80.0–100.0)
Monocytes Absolute: 0.5 10*3/uL (ref 0.1–1.0)
Monocytes Relative: 8 %
Neutro Abs: 3.9 10*3/uL (ref 1.7–7.7)
Neutrophils Relative %: 66 %
Platelet Count: 245 10*3/uL (ref 150–400)
RBC: 4.13 MIL/uL (ref 3.87–5.11)
RDW: 12.7 % (ref 11.5–15.5)
WBC Count: 5.9 10*3/uL (ref 4.0–10.5)
nRBC: 0 % (ref 0.0–0.2)

## 2020-10-02 LAB — CMP (CANCER CENTER ONLY)
ALT: 21 U/L (ref 0–44)
AST: 25 U/L (ref 15–41)
Albumin: 3.8 g/dL (ref 3.5–5.0)
Alkaline Phosphatase: 106 U/L (ref 38–126)
Anion gap: 10 (ref 5–15)
BUN: 9 mg/dL (ref 8–23)
CO2: 24 mmol/L (ref 22–32)
Calcium: 9.9 mg/dL (ref 8.9–10.3)
Chloride: 105 mmol/L (ref 98–111)
Creatinine: 0.83 mg/dL (ref 0.44–1.00)
GFR, Estimated: >60 mL/min (ref >60)
Glucose, Bld: 238 mg/dL — ABNORMAL HIGH (ref 70–99)
Potassium: 3.8 mmol/L (ref 3.5–5.1)
Sodium: 139 mmol/L (ref 135–145)
Total Bilirubin: 0.3 mg/dL (ref 0.3–1.2)
Total Protein: 7.5 g/dL (ref 6.5–8.1)

## 2020-10-02 MED ORDER — DIPHENHYDRAMINE HCL 25 MG PO CAPS: 50.0000 mg | ORAL_CAPSULE | Freq: Once | ORAL | Status: AC

## 2020-10-02 MED ORDER — SODIUM CHLORIDE 0.9% FLUSH
10.0000 mL | Freq: Once | INTRAVENOUS | Status: AC
  Administered 2020-10-02: 10 mL

## 2020-10-02 MED ORDER — ACETAMINOPHEN 325 MG PO TABS
ORAL_TABLET | ORAL | Status: AC
  Administered 2020-10-02: 650 mg via ORAL
  Filled 2020-10-02: qty 2

## 2020-10-02 MED ORDER — SODIUM CHLORIDE 0.9 % IV SOLN
3.6000 mg/kg | Freq: Once | INTRAVENOUS | Status: AC
  Administered 2020-10-02: 320 mg via INTRAVENOUS
  Filled 2020-10-02: qty 16

## 2020-10-02 MED ORDER — SODIUM CHLORIDE 0.9 % IV SOLN: Freq: Once | INTRAVENOUS | Status: AC

## 2020-10-02 MED ORDER — SODIUM CHLORIDE 0.9% FLUSH
10.0000 mL | INTRAVENOUS | Status: DC | PRN
  Administered 2020-10-02: 10 mL

## 2020-10-02 MED ORDER — HEPARIN SOD (PORK) LOCK FLUSH 100 UNIT/ML IV SOLN
500.0000 [IU] | Freq: Once | INTRAVENOUS | Status: AC | PRN
  Administered 2020-10-02: 500 [IU]

## 2020-10-02 MED ORDER — DIPHENHYDRAMINE HCL 25 MG PO CAPS
ORAL_CAPSULE | ORAL | Status: AC
  Administered 2020-10-02: 50 mg via ORAL
  Filled 2020-10-02: qty 2

## 2020-10-02 MED ORDER — ACETAMINOPHEN 325 MG PO TABS: 650.0000 mg | ORAL_TABLET | Freq: Once | ORAL | Status: AC

## 2020-10-02 NOTE — Assessment & Plan Note (Signed)
08/04/20:Right mastectomy (Dr. 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 Neoadjuvant chemotherapy with TCH Perjeta 6 cycles followed by Kadcyla maintenance for 1 year 2. Followed bymastectomywith sentinel lymph node study8/2/22 3. Followed by adjuvant radiation therapy 4.Decided not to do antiestrogen therapy because her ER was 0% on final path. 5.Consideration for neratinib ----------------------------------------------------------------------------------------------------------------- 03/15/20: Bone Scan: Subtle uptake in ribs: Non diagnostic 03/16/20: CT CAP: Large Rt breast mass with skin thickening, small sat lesion 1.8 cm, multiple enlarged LN, 4 mm LLL nodule ---------------------------------------------------------------------------------------------------------------- Current Treatment: Kadcyla q 3 weeks, today is cycle 3 Echocardiogram 06/29/2020: EF 65 to 70% we will obtain her next echocardiogram in a month. Adj XRTwill start 09/18/2020  Kadcyla toxicities: Severe fatigue: Fatigue has finally started to subside Denies any nausea or vomiting.  She is able to eat regular food  RTC every 3 weeks

## 2020-10-02 NOTE — Progress Notes (Signed)
Per MD okay to tx with HR 104.

## 2020-10-02 NOTE — Progress Notes (Signed)
Per Dr. Lindi Adie, "OK To Treat w/elevated HR today".

## 2020-10-02 NOTE — Progress Notes (Signed)
13min post observation performed and pt remained WNL at time of discharge.

## 2020-10-02 NOTE — Patient Instructions (Signed)
[facility]  Discharge Instructions: Thank you for choosing [facility] to provide your oncology and hematology care.   If you have a lab appointment with the Cancer Center, please go directly to the Cancer Center and check in at the registration area.   Wear comfortable clothing and clothing appropriate for easy access to any Portacath or PICC line.   We strive to give you quality time with your provider. You may need to reschedule your appointment if you arrive late (15 or more minutes).  Arriving late affects you and other patients whose appointments are after yours.  Also, if you miss three or more appointments without notifying the office, you may be dismissed from the clinic at the provider's discretion.      For prescription refill requests, have your pharmacy contact our office and allow 72 hours for refills to be completed.    Today you received the following chemotherapy and/or immunotherapy agents Ado-Trastuzumab Emtansine.      To help prevent nausea and vomiting after your treatment, we encourage you to take your nausea medication as directed.  BELOW ARE SYMPTOMS THAT SHOULD BE REPORTED IMMEDIATELY: *FEVER GREATER THAN 100.4 F (38 C) OR HIGHER *CHILLS OR SWEATING *NAUSEA AND VOMITING THAT IS NOT CONTROLLED WITH YOUR NAUSEA MEDICATION *UNUSUAL SHORTNESS OF BREATH *UNUSUAL BRUISING OR BLEEDING *URINARY PROBLEMS (pain or burning when urinating, or frequent urination) *BOWEL PROBLEMS (unusual diarrhea, constipation, pain near the anus) TENDERNESS IN MOUTH AND THROAT WITH OR WITHOUT PRESENCE OF ULCERS (sore throat, sores in mouth, or a toothache) UNUSUAL RASH, SWELLING OR PAIN  UNUSUAL VAGINAL DISCHARGE OR ITCHING   Items with * indicate a potential emergency and should be followed up as soon as possible or go to the Emergency Department if any problems should occur.  Please show the CHEMOTHERAPY ALERT CARD or IMMUNOTHERAPY ALERT CARD  at check-in to the Emergency Department and triage nurse.  Should you have questions after your visit or need to cancel or reschedule your appointment, please contact [facility]  Dept: [phone]  and follow the prompts.  Office hours are 8:00 a.m. to 4:30 p.m. Monday - Friday. Please note that voicemails left after 4:00 p.m. may not be returned until the following business day.  We are closed weekends and major holidays. You have access to a nurse at all times for urgent questions. Please call the main number to the clinic Dept: [phone] and follow the prompts.   For any non-urgent questions, you may also contact your provider using MyChart. We now offer e-Visits for anyone 18 and older to request care online for non-urgent symptoms. For details visit [email or web address].   Also download the MyChart app! Go to the app store, search "MyChart", open the app, select [facility], and log in with your MyChart username and password.  Due to Covid, a mask is required upon entering the hospital/clinic. If you do not have a mask, one will be given to you upon arrival. For doctor visits, patients may have 1 support person aged 18 or older with them. For treatment visits, patients cannot have anyone with them due to current Covid guidelines and our immunocompromised population.   

## 2020-10-05 ENCOUNTER — Ambulatory Visit: Admission: RE | Admit: 2020-10-05 | Payer: Managed Care, Other (non HMO) | Source: Ambulatory Visit

## 2020-10-06 ENCOUNTER — Ambulatory Visit: Payer: Managed Care, Other (non HMO) | Admitting: Physical Therapy

## 2020-10-06 ENCOUNTER — Ambulatory Visit
Admission: RE | Admit: 2020-10-06 | Discharge: 2020-10-06 | Disposition: A | Discharge status: Home / Self Care | Payer: Managed Care, Other (non HMO) | Source: Ambulatory Visit | Attending: Radiation Oncology | Admitting: Radiation Oncology

## 2020-10-06 ENCOUNTER — Encounter: Payer: Self-pay | Admitting: Physical Therapy

## 2020-10-06 ENCOUNTER — Other Ambulatory Visit: Payer: Self-pay

## 2020-10-06 DIAGNOSIS — Z452 Encounter for adjustment and management of vascular access device: Secondary | ICD-10-CM | POA: Diagnosis not present

## 2020-10-06 DIAGNOSIS — R293 Abnormal posture: Secondary | ICD-10-CM | POA: Insufficient documentation

## 2020-10-06 DIAGNOSIS — C50411 Malignant neoplasm of upper-outer quadrant of right female breast: Secondary | ICD-10-CM | POA: Insufficient documentation

## 2020-10-06 DIAGNOSIS — K5903 Drug induced constipation: Secondary | ICD-10-CM | POA: Diagnosis not present

## 2020-10-06 DIAGNOSIS — Z170 Estrogen receptor positive status [ER+]: Secondary | ICD-10-CM

## 2020-10-06 DIAGNOSIS — M25611 Stiffness of right shoulder, not elsewhere classified: Secondary | ICD-10-CM | POA: Insufficient documentation

## 2020-10-06 DIAGNOSIS — Z483 Aftercare following surgery for neoplasm: Secondary | ICD-10-CM | POA: Insufficient documentation

## 2020-10-06 DIAGNOSIS — C773 Secondary and unspecified malignant neoplasm of axilla and upper limb lymph nodes: Secondary | ICD-10-CM | POA: Diagnosis not present

## 2020-10-06 DIAGNOSIS — Z5112 Encounter for antineoplastic immunotherapy: Secondary | ICD-10-CM | POA: Diagnosis present

## 2020-10-06 NOTE — Patient Instructions (Signed)
Over Head Pull: Narrow and Wide Grip   Cancer Rehab [phone]   On back, knees bent, feet flat, band across thighs, elbows straight but relaxed. Pull hands apart (start). Keeping elbows straight, bring arms up and over head, hands toward floor. Keep pull steady on band. Hold momentarily. Return slowly, keeping pull steady, back to start. Then do same with a wider grip on the band (past shoulder width) Repeat _10__ times. Band color __yellow____   Side Pull: Double Arm   On back, knees bent, feet flat. Arms perpendicular to body, shoulder level, elbows straight but relaxed. Pull arms out to sides, elbows straight. Resistance band comes across collarbones, hands toward floor. Hold momentarily. Slowly return to starting position. Repeat _10__ times. Band color _yellow____   Sword   On back, knees bent, feet flat, left hand on left hip, right hand above left. Pull right arm DIAGONALLY (hip to shoulder) across chest. Bring right arm along head toward floor. Thumb is point down when by hip and rotates upwards when by head. Hold momentarily. Slowly return to starting position. Repeat _10__ times. Do with left arm. Band color _yellow_____   Shoulder Rotation: Double Arm   On back, knees bent, feet flat, elbows tucked at sides, bent 90, hands palms up. Pull hands apart and down toward floor, keeping elbows near sides. Hold momentarily. Slowly return to starting position. Repeat _10__ times. Band color __yellow____

## 2020-10-06 NOTE — Therapy (Signed)
[facility] @ [place], [place], [address] Phone:     Fax:     Physical Therapy Treatment  Patient Details  Name: Sabrina Woods MRN: [id number] Date of Birth: 1955/04/11 Referring Provider (PT): Lindi Adie   Encounter Date: 10/06/2020   PT End of Session - 10/06/20 0953     Visit Number 10    Number of Visits 9    Date for PT Re-Evaluation 10/07/20    PT Start Time 0906    PT Stop Time 0957    PT Time Calculation (min) 51 min    Activity Tolerance Patient tolerated treatment well    Behavior During Therapy [facility] for tasks assessed/performed             Past Medical History:  Diagnosis Date   Acid reflux    Anxiety    Breast cancer ([place])    Breast cancer ([place]) 2022   Depression    HISTORY OF   Fibroid    PONV (postoperative nausea and vomiting)    Smoker     Past Surgical History:  Procedure Laterality Date   CESAREAN SECTION     X 2   MASTECTOMY WITH AXILLARY LYMPH NODE DISSECTION Right 08/04/2020   Procedure: RIGHT MASTECTOMY WITH RIGHT AXILLARY LYMPH NODE DISSECTION;  Surgeon: Rolm Bookbinder, MD;  Location: [facility];  Service: General;  Laterality: Right;   PORTACATH PLACEMENT Left 03/17/2020   Procedure: INSERTION PORT-A-CATH;  Surgeon: Rolm Bookbinder, MD;  Location: [facility];  Service: General;  Laterality: Left;    There were no vitals filed for this visit.   Subjective Assessment - 10/06/20 0906     Subjective My tightness is getting better.    Pertinent History Pt palpated a lump in her right breast 6 months ago.  She was seen by her gynecologist who ordered Mammogram/ [place].  Cancer is located in the upper-outer quadrant of the right breast and is ER+.  She is presently having neoadjuvant chemo, and will have radiation following surgery and anti-estrogen.    Patient Stated Goals to try to get back to normal    Currently in Pain? No/denies    Pain Score 0-No pain                 [facility] PT Assessment - 10/06/20 0001       AROM   Right Shoulder Extension 77 Degrees    Right Shoulder Flexion 170 Degrees    Right Shoulder ABduction 170 Degrees    Right Shoulder Internal Rotation 68 Degrees    Right Shoulder External Rotation 80 Degrees                           [facility] Adult PT Treatment/Exercise - 10/06/20 0001       Shoulder Exercises: Supine   Horizontal ABduction Strengthening;Both;10 reps   pt returned therapist demo   Theraband Level (Shoulder Horizontal ABduction) Level 1 (Yellow)    Horizontal ABduction Limitations less shaky today    External Rotation Strengthening;Both;10 reps   pt returned therapist demo   Theraband Level (Shoulder External Rotation) Level 1 (Yellow)    Flexion Strengthening;Both;10 reps   narrow and wide grip; pt returned therapist demo   Theraband Level (Shoulder Flexion) Level 1 (Yellow)    Diagonals Strengthening;Both;10 reps   pt returned therapist demo   Theraband Level (Shoulder Diagonals) Level 1 (Yellow)  Shoulder Exercises: Pulleys   Flexion 2 minutes    ABduction 2 minutes      Manual Therapy   Myofascial Release no MFR today due to radiated skin    Passive ROM PROM to R shoulder in to flexion and abduction while working on STM to [employer]                          PT Long Term Goals - 10/06/20 0910       PT LONG TERM GOAL #1   Title Pts post surgical shoulder ROM and function will be equal to pre-surgical ROM    Time 22    Period Weeks    Status Achieved      PT LONG TERM GOAL #2   Title Pt will demonstrate 150 degrees of R shoulder abduction with no end range tightness across R pec to allow pt to return to PLOF    Baseline 145; 10/06/20- 170    Time 4    Period Weeks    Status Achieved      PT LONG TERM GOAL #3   Title Pt will demonstrate 90 degrees of R shoulder external rotation to allow her to return to PLOF.    Baseline 77; 10/06/20- 80    Time 4     Period Weeks    Status On-going      PT LONG TERM GOAL #4   Title Pt will be independent in a home exercise program for long term stretching and strengthening    Time 6    Period Weeks    Status On-going      PT LONG TERM GOAL #5   Title Pt will be able to verbalize lymphedema risk reduction practices    Baseline 10/06/20- pt educated in this today    Time 6    Period Weeks    Status Achieved                   Plan - 10/06/20 1010     Clinical Impression Statement Assessed pt's progress towards goals in therapy and she has mostly met her ROM goals and is progressing towards her shoulder external rotation goal. She is now independent in her current home exercises. Continued instruction in supine scapular exercises and issued these as part of pt's HEP. Pt's skin is starting to become red. Will place pt on hold until she completes radiation since her ROM is back to baseline and pt is independent in her HEP. Educated pt not to stretch if her skin becomes compromised from radiation.    PT Frequency 2x / week    PT Duration 4 weeks    PT Treatment/Interventions Patient/family education;ADLs/Self Care Home Management;Manual techniques;Manual lymph drainage;Scar mobilization;Passive range of motion;Taping;Therapeutic exercise    PT Next Visit Plan reassess and do SOZO - make new sozo in another 3 months,-MFR cording R axilla, assess skin integrity,  continue ROM to R shoulder in to flexion and abduction and R pec stretching, give strength ABC prior to d/c    PT Home Exercise Plan post op breast exercises, dowel exercises, supine scap exercises             Patient will benefit from skilled therapeutic intervention in order to improve the following deficits and impairments:  Decreased knowledge of precautions, Postural dysfunction, Decreased range of motion, Decreased scar mobility, Pain, Impaired UE functional use, Increased fascial restricitons  Visit Diagnosis: Stiffness of  right  shoulder, not elsewhere classified  Aftercare following surgery for neoplasm  Abnormal posture  Malignant neoplasm of upper-outer quadrant of right breast in female, estrogen receptor positive [facility])     Problem List Patient Active Problem List   Diagnosis Date Noted   S/P mastectomy, right 08/04/2020   Chemotherapy-induced peripheral neuropathy ([place]) 05/20/2020   Port-A-Cath in place 03/19/2020   Malignant neoplasm of upper-outer quadrant of right breast in female, estrogen receptor positive ([place]) 03/10/2020   Fibroid    Smoker    Depression     Allyson Sabal [place], PT 10/06/2020, 10:14 AM  [facility] @ [place], [place], [address] Phone:     Fax:     Name: IVI GRIFFITH MRN: [id number] Date of Birth: May 09, 1955   Manus Gunning, PT 10/06/20 10:15 AM

## 2020-10-07 ENCOUNTER — Ambulatory Visit
Admission: RE | Admit: 2020-10-07 | Discharge: 2020-10-07 | Disposition: A | Discharge status: Home / Self Care | Payer: Managed Care, Other (non HMO) | Source: Ambulatory Visit | Attending: Radiation Oncology | Admitting: Radiation Oncology

## 2020-10-07 DIAGNOSIS — Z5112 Encounter for antineoplastic immunotherapy: Secondary | ICD-10-CM | POA: Diagnosis not present

## 2020-10-08 ENCOUNTER — Ambulatory Visit (HOSPITAL_COMMUNITY)
Admission: RE | Admit: 2020-10-08 | Discharge: 2020-10-08 | Disposition: A | Discharge status: Home / Self Care | Payer: Managed Care, Other (non HMO) | Source: Ambulatory Visit | Attending: Hematology and Oncology | Admitting: Hematology and Oncology

## 2020-10-08 ENCOUNTER — Ambulatory Visit
Admission: RE | Admit: 2020-10-08 | Discharge: 2020-10-08 | Disposition: A | Discharge status: Home / Self Care | Payer: Managed Care, Other (non HMO) | Source: Ambulatory Visit | Attending: Radiation Oncology | Admitting: Radiation Oncology

## 2020-10-08 ENCOUNTER — Other Ambulatory Visit: Payer: Self-pay

## 2020-10-08 DIAGNOSIS — F17200 Nicotine dependence, unspecified, uncomplicated: Secondary | ICD-10-CM | POA: Diagnosis not present

## 2020-10-08 DIAGNOSIS — Z5181 Encounter for therapeutic drug level monitoring: Secondary | ICD-10-CM | POA: Diagnosis present

## 2020-10-08 DIAGNOSIS — I350 Nonrheumatic aortic (valve) stenosis: Secondary | ICD-10-CM

## 2020-10-08 DIAGNOSIS — I358 Other nonrheumatic aortic valve disorders: Secondary | ICD-10-CM | POA: Diagnosis not present

## 2020-10-08 DIAGNOSIS — I517 Cardiomegaly: Secondary | ICD-10-CM | POA: Diagnosis not present

## 2020-10-08 DIAGNOSIS — C50919 Malignant neoplasm of unspecified site of unspecified female breast: Secondary | ICD-10-CM | POA: Diagnosis not present

## 2020-10-08 DIAGNOSIS — I5030 Unspecified diastolic (congestive) heart failure: Secondary | ICD-10-CM | POA: Diagnosis not present

## 2020-10-08 DIAGNOSIS — Z5111 Encounter for antineoplastic chemotherapy: Secondary | ICD-10-CM

## 2020-10-08 DIAGNOSIS — Z0189 Encounter for other specified special examinations: Secondary | ICD-10-CM

## 2020-10-08 DIAGNOSIS — Z79899 Other long term (current) drug therapy: Secondary | ICD-10-CM | POA: Diagnosis not present

## 2020-10-08 LAB — ECHOCARDIOGRAM COMPLETE
Area-P 1/2: 3.99 cm2
Calc EF: 74.6 %
S' Lateral: 1.9 cm
Single Plane A2C EF: 74.2 %
Single Plane A4C EF: 73.2 %

## 2020-10-08 NOTE — Progress Notes (Signed)
  Echocardiogram 2D Echocardiogram has been performed.  Roseanna Rainbow R 10/08/2020, 9:22 AM

## 2020-10-09 ENCOUNTER — Other Ambulatory Visit: Payer: Self-pay

## 2020-10-09 ENCOUNTER — Ambulatory Visit
Admission: RE | Admit: 2020-10-09 | Discharge: 2020-10-09 | Disposition: A | Discharge status: Home / Self Care | Payer: Managed Care, Other (non HMO) | Source: Ambulatory Visit | Attending: Radiation Oncology | Admitting: Radiation Oncology

## 2020-10-09 DIAGNOSIS — Z5112 Encounter for antineoplastic immunotherapy: Secondary | ICD-10-CM | POA: Diagnosis not present

## 2020-10-12 ENCOUNTER — Ambulatory Visit
Admission: RE | Admit: 2020-10-12 | Discharge: 2020-10-12 | Disposition: A | Discharge status: Home / Self Care | Payer: Managed Care, Other (non HMO) | Source: Ambulatory Visit | Attending: Radiation Oncology | Admitting: Radiation Oncology

## 2020-10-12 ENCOUNTER — Other Ambulatory Visit: Payer: Self-pay

## 2020-10-12 DIAGNOSIS — Z5112 Encounter for antineoplastic immunotherapy: Secondary | ICD-10-CM | POA: Diagnosis not present

## 2020-10-13 ENCOUNTER — Ambulatory Visit
Admission: RE | Admit: 2020-10-13 | Discharge: 2020-10-13 | Disposition: A | Discharge status: Home / Self Care | Payer: Managed Care, Other (non HMO) | Source: Ambulatory Visit | Attending: Radiation Oncology | Admitting: Radiation Oncology

## 2020-10-13 ENCOUNTER — Telehealth: Payer: Self-pay | Admitting: *Deleted

## 2020-10-13 DIAGNOSIS — Z5181 Encounter for therapeutic drug level monitoring: Secondary | ICD-10-CM

## 2020-10-13 DIAGNOSIS — C50411 Malignant neoplasm of upper-outer quadrant of right female breast: Secondary | ICD-10-CM

## 2020-10-13 DIAGNOSIS — Z170 Estrogen receptor positive status [ER+]: Secondary | ICD-10-CM

## 2020-10-13 DIAGNOSIS — Z5112 Encounter for antineoplastic immunotherapy: Secondary | ICD-10-CM | POA: Diagnosis not present

## 2020-10-13 NOTE — Telephone Encounter (Signed)
Received call from pt with complaint of constipation, muscle aches,and back pain since Saturday 10/10/20.  Pt requesting advice on alleviating constipation.  RN educated pt to take OTC milk of magnesium as directed on the back of the bottle as well as insert a glycerin suppository.  RN also educated pt on increasing oral fluid intake and to call the office tomorrow if OTC therapies do not work today. Per MD pt needing Mag level drawn at next appt to evaluate for hypomagnesemia with muscle aches. Pt verbalized understanding.

## 2020-10-14 ENCOUNTER — Ambulatory Visit: Payer: Managed Care, Other (non HMO)

## 2020-10-15 ENCOUNTER — Ambulatory Visit
Admission: RE | Admit: 2020-10-15 | Discharge: 2020-10-15 | Disposition: A | Discharge status: Home / Self Care | Payer: Managed Care, Other (non HMO) | Source: Ambulatory Visit | Attending: Radiation Oncology | Admitting: Radiation Oncology

## 2020-10-15 ENCOUNTER — Other Ambulatory Visit: Payer: Self-pay

## 2020-10-15 DIAGNOSIS — Z5112 Encounter for antineoplastic immunotherapy: Secondary | ICD-10-CM | POA: Diagnosis not present

## 2020-10-16 ENCOUNTER — Inpatient Hospital Stay: Payer: Managed Care, Other (non HMO) | Attending: Hematology and Oncology | Admitting: Adult Health

## 2020-10-16 ENCOUNTER — Ambulatory Visit
Admission: RE | Admit: 2020-10-16 | Discharge: 2020-10-16 | Disposition: A | Discharge status: Home / Self Care | Payer: Managed Care, Other (non HMO) | Source: Ambulatory Visit | Attending: Radiation Oncology | Admitting: Radiation Oncology

## 2020-10-16 ENCOUNTER — Telehealth: Payer: Self-pay | Admitting: *Deleted

## 2020-10-16 ENCOUNTER — Ambulatory Visit (HOSPITAL_COMMUNITY)
Admission: RE | Admit: 2020-10-16 | Discharge: 2020-10-16 | Disposition: A | Discharge status: Home / Self Care | Payer: Managed Care, Other (non HMO) | Source: Ambulatory Visit | Attending: Adult Health | Admitting: Adult Health

## 2020-10-16 VITALS — BP 159/82 | HR 110 | Temp 97.8°F | Resp 18 | Ht 66.0 in | Wt 195.4 lb

## 2020-10-16 DIAGNOSIS — C50411 Malignant neoplasm of upper-outer quadrant of right female breast: Secondary | ICD-10-CM | POA: Insufficient documentation

## 2020-10-16 DIAGNOSIS — Z452 Encounter for adjustment and management of vascular access device: Secondary | ICD-10-CM | POA: Insufficient documentation

## 2020-10-16 DIAGNOSIS — Z5112 Encounter for antineoplastic immunotherapy: Secondary | ICD-10-CM | POA: Diagnosis not present

## 2020-10-16 DIAGNOSIS — K5903 Drug induced constipation: Secondary | ICD-10-CM | POA: Diagnosis present

## 2020-10-16 DIAGNOSIS — Z170 Estrogen receptor positive status [ER+]: Secondary | ICD-10-CM | POA: Insufficient documentation

## 2020-10-16 DIAGNOSIS — K5900 Constipation, unspecified: Secondary | ICD-10-CM | POA: Insufficient documentation

## 2020-10-16 DIAGNOSIS — M25611 Stiffness of right shoulder, not elsewhere classified: Secondary | ICD-10-CM | POA: Insufficient documentation

## 2020-10-16 DIAGNOSIS — C773 Secondary and unspecified malignant neoplasm of axilla and upper limb lymph nodes: Secondary | ICD-10-CM | POA: Insufficient documentation

## 2020-10-16 DIAGNOSIS — R293 Abnormal posture: Secondary | ICD-10-CM | POA: Insufficient documentation

## 2020-10-16 MED ORDER — RADIAPLEXRX EX GEL: Freq: Once | CUTANEOUS | Status: AC

## 2020-10-16 NOTE — Telephone Encounter (Signed)
Received call from pt with complaint of constipation not fully alleviated with OTC milk of magnesium or glycerin suppositories.  Pt states last full BM was Sunday 10/11/20 and has only experience very small BMs with the usage of OTC medications.  Pt states she is still able to pass gas but feels very boated and only able to eat small amounts of food at a time.  Per MD pt needing to be seen in [facility] today for further evaluation.  Appt scheduled and pt verbalized understanding of date and time.

## 2020-10-16 NOTE — Telephone Encounter (Signed)
Per Wilber Bihari, NP pt abd xray negative for bowel obstruction.  Orders received to educate pt to take 2 tablets of Senokot-S today and to start taking Miralax daily.  If no BM in 2 days, pt to drink 1/2 bottle of Mag Citrate.  Pt educated and verbalized understanding.

## 2020-10-16 NOTE — Progress Notes (Signed)
[facility] Follow up:    Sabrina Lose, MD [place] [address]   DIAGNOSIS: Cancer Staging Malignant neoplasm of upper-outer quadrant of right breast in female, estrogen receptor positive ([place]) Staging form: Breast, AJCC 8th Edition - Clinical stage from 03/10/2020: Stage IIIA (cT3, cN1, cM0, G3, ER+, PR-, HER2+) - Signed by Sabrina Lose, MD on 03/10/2020 Stage prefix: Initial diagnosis   SUMMARY OF ONCOLOGIC HISTORY: Oncology History  Malignant neoplasm of upper-outer quadrant of right breast in female, estrogen receptor positive ([place])  03/10/2020 Initial Diagnosis   Palpable right breast mass x6 months.  Mammogram revealed left breast cysts (intraductal papilloma), 11 cm right breast mass and 2 masses 2.6 cm each in the right axilla: Biopsy grade 3 IDC ER 5% weak, PR 0%, Ki-67 25%, HER-2 3+ IHC positive   03/10/2020 Cancer Staging   Staging form: Breast, AJCC 8th Edition - Clinical stage from 03/10/2020: Stage IIIA (cT3, cN1, cM0, G3, ER+, PR-, HER2+) - Signed by Sabrina Lose, MD on 03/10/2020 Stage prefix: Initial diagnosis   03/19/2020 - 07/01/2020 Chemotherapy   TCH Perjeta        08/04/2020 Surgery   Right mastectomy (Dr. 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 female returns for urgent evaluation of stomach discomfort.  She continues on treatment with kadcyla every 3 weeks last given on 10/02/2020 and this is due again in one week.  She also began adjuvant radiation on 09/17/2020 and is due to go through 11/04/2020.    She is here to discuss her abdominal  pain.  She called our office with constipations on 10/11/52022.  The constipation had begun on 10/10/2020.  She was educated to get OTC mild of magnesium and glycerin suppository.  She took this on 10/11 and 10/13 with only small movement.  She is passing gas.  She is bloated and has decreased appetite.     Patient Active Problem List   Diagnosis Date Noted   S/P mastectomy, right 08/04/2020   Chemotherapy-induced peripheral neuropathy ([place]) 05/20/2020   Port-A-Cath in place 03/19/2020   Malignant neoplasm of upper-outer quadrant of right breast in female, estrogen receptor positive ([place]) 03/10/2020   Fibroid    Smoker    Depression     has No Known Allergies.  MEDICAL HISTORY: Past Medical History:  Diagnosis Date   Acid reflux    Anxiety    Breast cancer ([place])    Breast cancer ([place]) 2022   Depression    HISTORY OF   Fibroid    PONV (postoperative nausea and vomiting)    Smoker     SURGICAL HISTORY: Past Surgical History:  Procedure Laterality Date   CESAREAN SECTION     X 2   MASTECTOMY WITH AXILLARY LYMPH NODE DISSECTION Right 08/04/2020   Procedure: RIGHT MASTECTOMY WITH RIGHT AXILLARY LYMPH NODE DISSECTION;  Surgeon: Rolm Bookbinder, MD;  Location: [facility];  Service: General;  Laterality: Right;   PORTACATH PLACEMENT Left 03/17/2020   Procedure: INSERTION PORT-A-CATH;  Surgeon: Rolm Bookbinder, MD;  Location: [facility];  Service: General;  Laterality: Left;    SOCIAL HISTORY: Social History  Socioeconomic History   Marital status: Married    Spouse name: Not on file   Number of children: Not on file   Years of education: Not on file   Highest education level: Not on file  Occupational History   Not on file  Tobacco Use   Smoking status: Every Day    Packs/day: 0.50    Types: Cigarettes   Smokeless tobacco: Never  Substance and Sexual Activity   Alcohol use: Yes    Comment: occasionally   Drug use: Not on file   Sexual activity: Yes     Birth control/protection: Post-menopausal  Other Topics Concern   Not on file  Social History Narrative   Not on file   Social Determinants of Health   Financial Resource Strain: Low Risk    Difficulty of Paying Living Expenses: Not hard at all  Food Insecurity: No Food Insecurity   Worried About Programme researcher, broadcasting/film/video in the Last Year: Never true   Ran Out of Food in the Last Year: Never true  Transportation Needs: No Transportation Needs   Lack of Transportation (Medical): No   Lack of Transportation (Non-Medical): No  Physical Activity: Not on file  Stress: Not on file  Social Connections: Not on file  Intimate Partner Violence: Not on file    FAMILY HISTORY: Family History  Problem Relation Age of Onset   Breast cancer Mother    Diabetes Mother    Hypertension Mother    Hypertension Father    Hypertension Sister    Hypertension Brother     Review of Systems  Constitutional:  Negative for appetite change, chills, fatigue, fever and unexpected weight change.  HENT:   Negative for hearing loss, lump/mass and trouble swallowing.   Eyes:  Negative for eye problems and icterus.  Respiratory:  Negative for chest tightness, cough and shortness of breath.   Cardiovascular:  Negative for chest pain, leg swelling and palpitations.  Gastrointestinal:  Positive for abdominal distention, constipation and nausea. Negative for abdominal pain, blood in stool, diarrhea and vomiting.  Endocrine: Negative for hot flashes.  Genitourinary:  Negative for difficulty urinating.   Musculoskeletal:  Negative for arthralgias.  Skin:  Negative for itching and rash.  Neurological:  Negative for dizziness, extremity weakness, headaches and numbness.  Hematological:  Negative for adenopathy. Does not bruise/bleed easily.  Psychiatric/Behavioral:  Negative for depression. The patient is not nervous/anxious.      PHYSICAL EXAMINATION  ECOG PERFORMANCE STATUS: 1 - Symptomatic but completely  ambulatory  Vitals:   10/16/20 1046  BP: (!) 159/82  Pulse: (!) 110  Resp: 18  Temp: 97.8 F (36.6 C)  SpO2: 98%    Physical Exam Constitutional:      General: She is not in acute distress.    Appearance: Normal appearance. She is not toxic-appearing.  HENT:     Head: Normocephalic and atraumatic.  Eyes:     General: No scleral icterus. Cardiovascular:     Rate and Rhythm: Normal rate and regular rhythm.     Pulses: Normal pulses.     Heart sounds: Normal heart sounds.  Pulmonary:     Effort: Pulmonary effort is normal.     Breath sounds: Normal breath sounds.  Abdominal:     General: Abdomen is flat. There is distension.     Palpations: Abdomen is soft.     Tenderness: There is no abdominal tenderness.     Comments: Mild distention and hypoactive bowel sounds  Musculoskeletal:  General: No swelling.     Cervical back: Neck supple.  Lymphadenopathy:     Cervical: No cervical adenopathy.  Skin:    General: Skin is warm and dry.     Findings: No rash.  Neurological:     General: No focal deficit present.     Mental Status: She is alert.  Psychiatric:        Mood and Affect: Mood normal.        Behavior: Behavior normal.    LABORATORY DATA:  CBC    Component Value Date/Time   WBC 5.9 10/02/2020 0933   WBC 13.0 (H) 08/05/2020 0038   RBC 4.13 10/02/2020 0933   HGB 12.7 10/02/2020 0933   HCT 37.7 10/02/2020 0933   PLT 245 10/02/2020 0933   MCV 91.3 10/02/2020 0933   MCH 30.8 10/02/2020 0933   MCHC 33.7 10/02/2020 0933   RDW 12.7 10/02/2020 0933   LYMPHSABS 1.4 10/02/2020 0933   MONOABS 0.5 10/02/2020 0933   EOSABS 0.1 10/02/2020 0933   BASOSABS 0.0 10/02/2020 0933    CMP     Component Value Date/Time   NA 139 10/02/2020 0933   K 3.8 10/02/2020 0933   CL 105 10/02/2020 0933   CO2 24 10/02/2020 0933   GLUCOSE 238 (H) 10/02/2020 0933   BUN 9 10/02/2020 0933   CREATININE 0.83 10/02/2020 0933   CALCIUM 9.9 10/02/2020 0933   PROT 7.5  10/02/2020 0933   ALBUMIN 3.8 10/02/2020 0933   AST 25 10/02/2020 0933   ALT 21 10/02/2020 0933   ALKPHOS 106 10/02/2020 0933   BILITOT 0.3 10/02/2020 0933   GFRNONAA >60 10/02/2020 0933      ASSESSMENT and THERAPY PLAN:   Malignant neoplasm of upper-outer quadrant of right breast in female, estrogen receptor positive ([place]) 08/04/20: Right mastectomy (Dr. 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 Neoadjuvant chemotherapy with TCH Perjeta 6 cycles followed by Kadcyla maintenance for 1 year 2. Followed by mastectomy with sentinel lymph node study 08/04/20 3. Followed by adjuvant radiation therapy 4.  Decided not to do antiestrogen therapy because her ER was 0% on final path. 5.  Consideration for neratinib ----------------------------------------------------------------------------------------------------------------- 03/15/20: Bone Scan: Subtle uptake in ribs: Non diagnostic 03/16/20: CT CAP: Large Rt breast mass with skin thickening, small sat lesion 1.8 cm, multiple enlarged LN, 4 mm LLL nodule ---------------------------------------------------------------------------------------------------------------- Current Treatment: Kadcyla q 3 weeks, she is due for this next week Echocardiogram 10/08/2020: EF 65 to 70% we will obtain her next echocardiogram in a month. Adj XRT underway  Dot and I discussed her constipation.  I reviewed that we will need to get plain films of her abdomen to evaluate fully.    Should those rule out any acute cause of her constipation, I will recommend Miralax daily, SenokotS as much as two tabs bid, and if she cannot have bm she can take mag citrate 1/2 bottle followed by 16 ounces of water.  She will call us if she doesn't improve, or if things get worse.          Orders  Placed This Encounter  Procedures   DG Abd 2 Views    Standing Status:   Future    Number of Occurrences:   1    Standing Expiration Date:   10/16/2021    Order Specific Question:   Reason  for Exam (SYMPTOM  OR DIAGNOSIS REQUIRED)    Answer:   constipation, eval for obstruction    Order Specific Question:   Preferred imaging location?    Answer:   [facility]    All questions were answered. The patient knows to call the clinic with any problems, questions or concerns. We can certainly see the patient much sooner if necessary.  Total encounter time: 20 minutes in face to face visit time,chart review, lab review, order entry, care coordination, documentation of encounter.    Wilber Bihari, NP 10/19/20 12:07 PM Medical Oncology and Hematology [facility] [place], [place] [address] Tel. [phone]    Fax. [phone]  *Total Encounter Time as defined by the Centers for Medicare and Medicaid Services includes, in addition to the face-to-face time of a patient visit (documented in the note above) non-face-to-face time: obtaining and reviewing outside history, ordering and reviewing medications, tests or procedures, care coordination (communications with other health care professionals or caregivers) and documentation in the medical record.

## 2020-10-19 ENCOUNTER — Ambulatory Visit
Admission: RE | Admit: 2020-10-19 | Discharge: 2020-10-19 | Disposition: A | Discharge status: Home / Self Care | Payer: Managed Care, Other (non HMO) | Source: Ambulatory Visit | Attending: Radiation Oncology | Admitting: Radiation Oncology

## 2020-10-19 ENCOUNTER — Other Ambulatory Visit: Payer: Self-pay

## 2020-10-19 ENCOUNTER — Encounter: Payer: Self-pay | Admitting: Hematology and Oncology

## 2020-10-19 DIAGNOSIS — Z5112 Encounter for antineoplastic immunotherapy: Secondary | ICD-10-CM | POA: Diagnosis not present

## 2020-10-19 NOTE — Assessment & Plan Note (Signed)
08/04/20:Right mastectomy (Dr. 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 Neoadjuvant chemotherapy with TCH Perjeta 6 cycles followed by Kadcyla maintenance for 1 year 2. Followed bymastectomywith sentinel lymph node study8/2/22 3. Followed by adjuvant radiation therapy 4.Decided not to do antiestrogen therapy because her ER was 0% on final path. 5.Consideration for neratinib ----------------------------------------------------------------------------------------------------------------- 03/15/20: Bone Scan: Subtle uptake in ribs: Non diagnostic 03/16/20: CT CAP: Large Rt breast mass with skin thickening, small sat lesion 1.8 cm, multiple enlarged LN, 4 mm LLL nodule ---------------------------------------------------------------------------------------------------------------- Current Treatment: Kadcyla q 3 weeks, she is due for this next week Echocardiogram 10/08/2020: EF 65 to 70% we will obtain her next echocardiogram in a month. Adj Donette Larry  Dot and I discussed her constipation.  I reviewed that we will need to get plain films of her abdomen to evaluate fully.    Should those rule out any acute cause of her constipation, I will recommend Miralax daily, SenokotS as much as two tabs bid, and if she cannot have bm she can take mag citrate 1/2 bottle followed by 16 ounces of water.  She will call us if she doesn't improve, or if things get worse.

## 2020-10-20 ENCOUNTER — Other Ambulatory Visit: Payer: Self-pay

## 2020-10-20 ENCOUNTER — Ambulatory Visit
Admission: RE | Admit: 2020-10-20 | Discharge: 2020-10-20 | Disposition: A | Discharge status: Home / Self Care | Payer: Managed Care, Other (non HMO) | Source: Ambulatory Visit | Attending: Radiation Oncology | Admitting: Radiation Oncology

## 2020-10-20 DIAGNOSIS — Z5112 Encounter for antineoplastic immunotherapy: Secondary | ICD-10-CM | POA: Diagnosis not present

## 2020-10-21 ENCOUNTER — Ambulatory Visit
Admission: RE | Admit: 2020-10-21 | Discharge: 2020-10-21 | Disposition: A | Discharge status: Home / Self Care | Payer: Managed Care, Other (non HMO) | Source: Ambulatory Visit | Attending: Radiation Oncology | Admitting: Radiation Oncology

## 2020-10-21 DIAGNOSIS — Z5112 Encounter for antineoplastic immunotherapy: Secondary | ICD-10-CM | POA: Diagnosis not present

## 2020-10-21 NOTE — Progress Notes (Signed)
Patient Care Team: [name], MD as PCP - General (Hematology and Oncology) Mauro Kaufmann, RN as Oncology Nurse Navigator Rockwell Germany, RN as Oncology Nurse Navigator  DIAGNOSIS:    ICD-10-CM   1. Malignant neoplasm of upper-outer quadrant of right breast in female, estrogen receptor positive ([place])  C50.411    Z17.0       SUMMARY OF ONCOLOGIC HISTORY: Oncology History  Malignant neoplasm of upper-outer quadrant of right breast in female, estrogen receptor positive ([place])  03/10/2020 Initial Diagnosis   Palpable right breast mass x6 months.  Mammogram revealed left breast cysts (intraductal papilloma), 11 cm right breast mass and 2 masses 2.6 cm each in the right axilla: Biopsy grade 3 IDC ER 5% weak, PR 0%, Ki-67 25%, HER-2 3+ IHC positive   03/10/2020 Cancer Staging   Staging form: Breast, AJCC 8th Edition - Clinical stage from 03/10/2020: Stage IIIA (cT3, cN1, cM0, G3, ER+, PR-, HER2+) - Signed by [name], MD on 03/10/2020 Stage prefix: Initial diagnosis   03/19/2020 - 07/01/2020 Chemotherapy   TCH Perjeta        08/04/2020 Surgery   Right mastectomy (Dr. 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 with above-mentioned history of  right breast cancer having undergone right mastectomy, currently on chemotherapy with Kadcyla today. She presents to the clinic today for treatment.  Her major complaint today is severe constipation.  Because of this she has not had an appetite and has not been eating or drinking much.  She is also getting extremely tired from radiation.  She tried milk of magnesia which  enabled her to go to the bathroom very small amount but not fully.  She also complains of profound fatigue.  ALLERGIES:  has No Known Allergies.  MEDICATIONS:  Current Outpatient Medications  Medication Sig Dispense Refill   ALPRAZolam (XANAX) 0.25 MG tablet Take 1 tablet (0.25 mg total) by mouth 2 (two) times daily as needed for anxiety. 60 tablet 3   esomeprazole (NEXIUM) 20 MG capsule Take 20 mg by mouth daily.     gabapentin (NEURONTIN) 300 MG capsule Take 1 capsule (300 mg total) by mouth 2 (two) times daily. 180 capsule 3   loratadine (CLARITIN) 10 MG tablet Take 10 mg by mouth daily as needed for rhinitis.     Multiple Vitamin (MULTIVITAMIN) capsule Take 1 capsule by mouth daily.     nicotine (NICODERM CQ - DOSED IN MG/24 HR) 7 mg/24hr patch PLACE 1 PATCH (7 MG TOTAL) ONTO THE SKIN DAILY. 28 patch 0   nicotine (NICODERM CQ) 14 mg/24hr patch Place 1 patch (14 mg total) onto the skin daily. 14 patch 0   No current facility-administered medications for this visit.    PHYSICAL EXAMINATION: ECOG PERFORMANCE STATUS: 1 - Symptomatic but completely ambulatory  Vitals:   10/22/20 1049  BP: (!) 170/89  Pulse: (!) 103  Resp: 18  Temp: (!) 97.3 F (36.3 C)  SpO2: 99%   Filed Weights   10/22/20 1049  Weight: 193 lb 6.4 oz (87.7 kg)    LABORATORY DATA:  I have reviewed the data as listed CMP Latest Ref  Rng & Units 10/02/2020 09/11/2020 08/21/2020  Glucose 70 - 99 mg/dL 238(H) 192(H) 184(H)  BUN 8 - 23 mg/dL $Remove'9 9 10  'Trsgseh$ Creatinine 0.44 - 1.00 mg/dL 0.83 0.83 0.90  Sodium 135 - 145 mmol/L 139 140 142  Potassium 3.5 - 5.1 mmol/L 3.8 3.6 3.9  Chloride 98 - 111 mmol/L 105 105 107  CO2 22 - 32 mmol/L $RemoveB'24 23 24  'RwUGdiHf$ Calcium 8.9 - 10.3 mg/dL 9.9 9.9 9.7  Total Protein 6.5 - 8.1 g/dL 7.5 7.2 6.9  Total Bilirubin 0.3 - 1.2 mg/dL 0.3 0.4 0.3  Alkaline Phos 38 - 126 U/L 106 105 96  AST 15 - 41 U/L 25 22 14(L)  ALT 0 - 44 U/L $Remo'21 22 15    'Ryqfw$ Lab Results  Component Value Date   WBC 6.8 10/22/2020    HGB 13.4 10/22/2020   HCT 39.0 10/22/2020   MCV 89.7 10/22/2020   PLT 263 10/22/2020   NEUTROABS 4.7 10/22/2020    ASSESSMENT & PLAN:  Malignant neoplasm of upper-outer quadrant of right breast in female, estrogen receptor positive ([place]) 08/04/20: Right mastectomy (Dr. 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 Neoadjuvant chemotherapy with TCH Perjeta 6 cycles followed by Kadcyla maintenance for 1 year 2. Followed by mastectomy with sentinel lymph node study 08/04/20 3. Followed by adjuvant radiation therapy 4.  Decided not to do antiestrogen therapy because her ER was 0% on final path. 5.  Consideration for neratinib ----------------------------------------------------------------------------------------------------------------- 03/15/20: Bone Scan: Subtle uptake in ribs: Non diagnostic 03/16/20: CT CAP: Large Rt breast mass with skin thickening, small sat lesion 1.8 cm, multiple enlarged LN, 4 mm LLL nodule ---------------------------------------------------------------------------------------------------------------- Current Treatment: Kadcyla q 3 weeks, today is cycle 4 Echocardiogram 06/29/2020: EF 65 to 70% we will obtain her next echocardiogram in a month. Adj XRT will start 09/18/2020   Kadcyla toxicities: Severe fatigue: Fatigue has finally started to subside Denies any nausea or vomiting.  She is able to eat regular food Severe constipation: Patient had to use milk of magnesia but in spite of that it did not help.  Therefore I am sending her prescription for lactulose.  If it does move with lactulose, instructed her to take Colace every day and MiraLAX periodically to prevent such severe constipation.   RTC every 3 weeks for Kadcyla and every 6 weeks to follow-up with me.    No orders of the  defined types were placed in this encounter.  The patient has a good understanding of the overall plan. she agrees with it. she will call with any problems that may develop before the next visit here.  Total time spent: 30 mins including face to face time and time spent for planning, charting and coordination of care  Rulon Eisenmenger, MD, MPH 10/22/2020  I, Thana Ates, am acting as scribe for Dr. [name].  I have reviewed the above documentation for accuracy and completeness, and I agree with the above.

## 2020-10-22 ENCOUNTER — Inpatient Hospital Stay (HOSPITAL_BASED_OUTPATIENT_CLINIC_OR_DEPARTMENT_OTHER): Payer: Managed Care, Other (non HMO) | Admitting: Hematology and Oncology

## 2020-10-22 ENCOUNTER — Ambulatory Visit
Admission: RE | Admit: 2020-10-22 | Discharge: 2020-10-22 | Disposition: A | Discharge status: Home / Self Care | Payer: Managed Care, Other (non HMO) | Source: Ambulatory Visit | Attending: Radiation Oncology | Admitting: Radiation Oncology

## 2020-10-22 ENCOUNTER — Inpatient Hospital Stay: Payer: Managed Care, Other (non HMO)

## 2020-10-22 ENCOUNTER — Other Ambulatory Visit: Payer: Self-pay

## 2020-10-22 DIAGNOSIS — C50411 Malignant neoplasm of upper-outer quadrant of right female breast: Secondary | ICD-10-CM | POA: Diagnosis not present

## 2020-10-22 DIAGNOSIS — Z170 Estrogen receptor positive status [ER+]: Secondary | ICD-10-CM

## 2020-10-22 DIAGNOSIS — Z95828 Presence of other vascular implants and grafts: Secondary | ICD-10-CM

## 2020-10-22 DIAGNOSIS — Z5112 Encounter for antineoplastic immunotherapy: Secondary | ICD-10-CM | POA: Diagnosis not present

## 2020-10-22 LAB — CBC WITH DIFFERENTIAL (CANCER CENTER ONLY)
Abs Immature Granulocytes: 0.02 10*3/uL (ref 0.00–0.07)
Basophils Absolute: 0.1 10*3/uL (ref 0.0–0.1)
Basophils Relative: 1 %
Eosinophils Absolute: 0.1 10*3/uL (ref 0.0–0.5)
Eosinophils Relative: 2 %
HCT: 39 % (ref 36.0–46.0)
Hemoglobin: 13.4 g/dL (ref 12.0–15.0)
Immature Granulocytes: 0 %
Lymphocytes Relative: 17 %
Lymphs Abs: 1.2 10*3/uL (ref 0.7–4.0)
MCH: 30.8 pg (ref 26.0–34.0)
MCHC: 34.4 g/dL (ref 30.0–36.0)
MCV: 89.7 fL (ref 80.0–100.0)
Monocytes Absolute: 0.7 10*3/uL (ref 0.1–1.0)
Monocytes Relative: 10 %
Neutro Abs: 4.7 10*3/uL (ref 1.7–7.7)
Neutrophils Relative %: 70 %
Platelet Count: 263 10*3/uL (ref 150–400)
RBC: 4.35 MIL/uL (ref 3.87–5.11)
RDW: 13.1 % (ref 11.5–15.5)
WBC Count: 6.8 10*3/uL (ref 4.0–10.5)
nRBC: 0 % (ref 0.0–0.2)

## 2020-10-22 LAB — CMP (CANCER CENTER ONLY)
ALT: 28 U/L (ref 0–44)
AST: 28 U/L (ref 15–41)
Albumin: 3.8 g/dL (ref 3.5–5.0)
Alkaline Phosphatase: 101 U/L (ref 38–126)
Anion gap: 11 (ref 5–15)
BUN: 8 mg/dL (ref 8–23)
CO2: 25 mmol/L (ref 22–32)
Calcium: 10.1 mg/dL (ref 8.9–10.3)
Chloride: 98 mmol/L (ref 98–111)
Creatinine: 0.79 mg/dL (ref 0.44–1.00)
GFR, Estimated: >60 mL/min (ref >60)
Glucose, Bld: 212 mg/dL — ABNORMAL HIGH (ref 70–99)
Potassium: 3.5 mmol/L (ref 3.5–5.1)
Sodium: 134 mmol/L — ABNORMAL LOW (ref 135–145)
Total Bilirubin: 0.4 mg/dL (ref 0.3–1.2)
Total Protein: 7.6 g/dL (ref 6.5–8.1)

## 2020-10-22 LAB — MAGNESIUM: Magnesium: 1.9 mg/dL (ref 1.7–2.4)

## 2020-10-22 MED ORDER — LACTULOSE 20 GM/30ML PO SOLN: 20.0000 g | Freq: Two times a day (BID) | ORAL | 0 refills | Status: DC | PRN

## 2020-10-22 MED ORDER — SODIUM CHLORIDE 0.9% FLUSH
10.0000 mL | Freq: Once | INTRAVENOUS | Status: AC
  Administered 2020-10-22: 10 mL

## 2020-10-22 MED ORDER — HEPARIN SOD (PORK) LOCK FLUSH 100 UNIT/ML IV SOLN
500.0000 [IU] | Freq: Once | INTRAVENOUS | Status: AC
  Administered 2020-10-22: 500 [IU]

## 2020-10-22 NOTE — Assessment & Plan Note (Signed)
08/04/20:Right mastectomy (Dr. 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 Neoadjuvant chemotherapy with TCH Perjeta 6 cycles followed by Kadcyla maintenance for 1 year 2. Followed bymastectomywith sentinel lymph node study8/2/22 3. Followed by adjuvant radiation therapy 4.Decided not to do antiestrogen therapy because her ER was 0% on final path. 5.Consideration for neratinib ----------------------------------------------------------------------------------------------------------------- 03/15/20: Bone Scan: Subtle uptake in ribs: Non diagnostic 03/16/20: CT CAP: Large Rt breast mass with skin thickening, small sat lesion 1.8 cm, multiple enlarged LN, 4 mm LLL nodule ---------------------------------------------------------------------------------------------------------------- Current Treatment: Kadcyla q 3 weeks, today iscycle 4 Echocardiogram 06/29/2020: EF 65 to 70% we will obtain her next echocardiogram in a month. Adj Deveron Furlong start9/16/2022  Kadcyla toxicities: Severe fatigue: Fatigue has finally started to subside Denies any nausea or vomiting. She is able to eat regular food Severe constipation: Patient had to use milk of magnesia  RTC every 3 weeks

## 2020-10-23 ENCOUNTER — Ambulatory Visit: Payer: Managed Care, Other (non HMO) | Admitting: Radiation Oncology

## 2020-10-23 ENCOUNTER — Ambulatory Visit: Payer: Managed Care, Other (non HMO) | Admitting: Hematology and Oncology

## 2020-10-23 ENCOUNTER — Other Ambulatory Visit: Payer: Managed Care, Other (non HMO)

## 2020-10-23 ENCOUNTER — Ambulatory Visit
Admission: RE | Admit: 2020-10-23 | Discharge: 2020-10-23 | Disposition: A | Discharge status: Home / Self Care | Payer: Managed Care, Other (non HMO) | Source: Ambulatory Visit | Attending: Radiation Oncology | Admitting: Radiation Oncology

## 2020-10-23 ENCOUNTER — Inpatient Hospital Stay: Payer: Managed Care, Other (non HMO)

## 2020-10-23 VITALS — BP 172/89 | HR 100 | Temp 98.2°F | Resp 18 | Wt 194.5 lb

## 2020-10-23 DIAGNOSIS — Z170 Estrogen receptor positive status [ER+]: Secondary | ICD-10-CM

## 2020-10-23 DIAGNOSIS — Z5112 Encounter for antineoplastic immunotherapy: Secondary | ICD-10-CM | POA: Diagnosis not present

## 2020-10-23 MED ORDER — SODIUM CHLORIDE 0.9 % IV SOLN: Freq: Once | INTRAVENOUS | Status: AC

## 2020-10-23 MED ORDER — DIPHENHYDRAMINE HCL 25 MG PO CAPS
50.0000 mg | ORAL_CAPSULE | Freq: Once | ORAL | Status: AC
  Administered 2020-10-23: 50 mg via ORAL
  Filled 2020-10-23: qty 2

## 2020-10-23 MED ORDER — SODIUM CHLORIDE 0.9 % IV SOLN
3.6000 mg/kg | Freq: Once | INTRAVENOUS | Status: AC
  Administered 2020-10-23: 320 mg via INTRAVENOUS
  Filled 2020-10-23: qty 16

## 2020-10-23 MED ORDER — SODIUM CHLORIDE 0.9% FLUSH
10.0000 mL | INTRAVENOUS | Status: DC | PRN
  Administered 2020-10-23: 10 mL

## 2020-10-23 MED ORDER — HEPARIN SOD (PORK) LOCK FLUSH 100 UNIT/ML IV SOLN
500.0000 [IU] | Freq: Once | INTRAVENOUS | Status: AC | PRN
  Administered 2020-10-23: 500 [IU]

## 2020-10-23 MED ORDER — ACETAMINOPHEN 325 MG PO TABS
650.0000 mg | ORAL_TABLET | Freq: Once | ORAL | Status: AC
  Administered 2020-10-23: 650 mg via ORAL
  Filled 2020-10-23: qty 2

## 2020-10-23 NOTE — Patient Instructions (Signed)
[facility] MEDICAL ONCOLOGY  Discharge Instructions: °Thank you for choosing [facility] to provide your oncology and hematology care.  ° °If you have a lab appointment with the Cancer Center, please go directly to the Cancer Center and check in at the registration area. °  °Wear comfortable clothing and clothing appropriate for easy access to any Portacath or PICC line.  ° °We strive to give you quality time with your provider. You may need to reschedule your appointment if you arrive late (15 or more minutes).  Arriving late affects you and other patients whose appointments are after yours.  Also, if you miss three or more appointments without notifying the office, you may be dismissed from the clinic at the provider’s discretion.    °  °For prescription refill requests, have your pharmacy contact our office and allow 72 hours for refills to be completed.   ° °Today you received the following chemotherapy and/or immunotherapy agents Kadcyla    °  °To help prevent nausea and vomiting after your treatment, we encourage you to take your nausea medication as directed. ° °BELOW ARE SYMPTOMS THAT SHOULD BE REPORTED IMMEDIATELY: °*FEVER GREATER THAN 100.4 F (38 °C) OR HIGHER °*CHILLS OR SWEATING °*NAUSEA AND VOMITING THAT IS NOT CONTROLLED WITH YOUR NAUSEA MEDICATION °*UNUSUAL SHORTNESS OF BREATH °*UNUSUAL BRUISING OR BLEEDING °*URINARY PROBLEMS (pain or burning when urinating, or frequent urination) °*BOWEL PROBLEMS (unusual diarrhea, constipation, pain near the anus) °TENDERNESS IN MOUTH AND THROAT WITH OR WITHOUT PRESENCE OF ULCERS (sore throat, sores in mouth, or a toothache) °UNUSUAL RASH, SWELLING OR PAIN  °UNUSUAL VAGINAL DISCHARGE OR ITCHING  ° °Items with * indicate a potential emergency and should be followed up as soon as possible or go to the Emergency Department if any problems should occur. ° °Please show the CHEMOTHERAPY ALERT CARD or IMMUNOTHERAPY ALERT CARD at check-in to the  Emergency Department and triage nurse. ° °Should you have questions after your visit or need to cancel or reschedule your appointment, please contact [facility]ICAL [facility]COL[facility]  Dept: [phone]  and follow the prompts.  Office hours are 8:00 a.m. to 4:30 p.m. Monday - Friday. Please note that voicemails left after 4:00 p.m. may not be returned until the following business day.  We are closed weekends and major holidays. You have access to a nurse at all times for urgent questions. Please call the main number to the clinic Dept: [phone] and follow the prompts. ° ° °For any non-urgent questions, you may also contact your provider using MyChart. We now offer e-Visits for anyone 18 and older to request care online for non-urgent symptoms. For details visit [email or web address]. °  °Also download the MyChart app! Go to the app store, search "MyChart", open the app, select , and log in with your MyChart username and password. ° °Due to Covid, a mask is required upon entering the hospital/clinic. If you do not have a mask, one will be given to you upon arrival. For doctor visits, patients may have 1 support person aged 18 or older with them. For treatment visits, patients cannot have anyone with them due to current Covid guidelines and our immunocompromised population.  ° °

## 2020-10-26 ENCOUNTER — Other Ambulatory Visit: Payer: Self-pay

## 2020-10-26 ENCOUNTER — Ambulatory Visit
Admission: RE | Admit: 2020-10-26 | Discharge: 2020-10-26 | Disposition: A | Discharge status: Home / Self Care | Payer: Managed Care, Other (non HMO) | Source: Ambulatory Visit | Attending: Radiation Oncology | Admitting: Radiation Oncology

## 2020-10-26 DIAGNOSIS — Z5112 Encounter for antineoplastic immunotherapy: Secondary | ICD-10-CM | POA: Diagnosis not present

## 2020-10-27 ENCOUNTER — Ambulatory Visit
Admission: RE | Admit: 2020-10-27 | Discharge: 2020-10-27 | Disposition: A | Discharge status: Home / Self Care | Payer: Managed Care, Other (non HMO) | Source: Ambulatory Visit | Attending: Radiation Oncology | Admitting: Radiation Oncology

## 2020-10-27 ENCOUNTER — Ambulatory Visit: Payer: Managed Care, Other (non HMO)

## 2020-10-27 DIAGNOSIS — Z5112 Encounter for antineoplastic immunotherapy: Secondary | ICD-10-CM | POA: Diagnosis not present

## 2020-10-28 ENCOUNTER — Ambulatory Visit: Payer: Managed Care, Other (non HMO)

## 2020-10-28 ENCOUNTER — Other Ambulatory Visit: Payer: Self-pay

## 2020-10-28 DIAGNOSIS — Z5112 Encounter for antineoplastic immunotherapy: Secondary | ICD-10-CM | POA: Diagnosis not present

## 2020-10-29 ENCOUNTER — Ambulatory Visit: Payer: Managed Care, Other (non HMO)

## 2020-10-29 DIAGNOSIS — Z5112 Encounter for antineoplastic immunotherapy: Secondary | ICD-10-CM | POA: Diagnosis not present

## 2020-10-30 ENCOUNTER — Other Ambulatory Visit: Payer: Self-pay

## 2020-10-30 ENCOUNTER — Ambulatory Visit
Admission: RE | Admit: 2020-10-30 | Discharge: 2020-10-30 | Disposition: A | Discharge status: Home / Self Care | Payer: Managed Care, Other (non HMO) | Source: Ambulatory Visit | Attending: Radiation Oncology | Admitting: Radiation Oncology

## 2020-10-30 ENCOUNTER — Encounter: Payer: Self-pay | Admitting: *Deleted

## 2020-10-30 DIAGNOSIS — Z5112 Encounter for antineoplastic immunotherapy: Secondary | ICD-10-CM | POA: Diagnosis not present

## 2020-10-30 DIAGNOSIS — Z170 Estrogen receptor positive status [ER+]: Secondary | ICD-10-CM

## 2020-10-30 DIAGNOSIS — C50411 Malignant neoplasm of upper-outer quadrant of right female breast: Secondary | ICD-10-CM

## 2020-10-30 MED ORDER — SONAFINE EX EMUL
1.0000 | Freq: Once | CUTANEOUS | Status: AC
Start: 2020-10-30 — End: 2020-10-30
  Administered 2020-10-30: 1 via TOPICAL

## 2020-11-02 ENCOUNTER — Ambulatory Visit: Payer: Managed Care, Other (non HMO)

## 2020-11-02 ENCOUNTER — Ambulatory Visit
Admission: RE | Admit: 2020-11-02 | Discharge: 2020-11-02 | Disposition: A | Discharge status: Home / Self Care | Payer: Managed Care, Other (non HMO) | Source: Ambulatory Visit | Attending: Radiation Oncology | Admitting: Radiation Oncology

## 2020-11-02 ENCOUNTER — Other Ambulatory Visit: Payer: Self-pay

## 2020-11-02 DIAGNOSIS — Z5112 Encounter for antineoplastic immunotherapy: Secondary | ICD-10-CM | POA: Diagnosis not present

## 2020-11-03 ENCOUNTER — Ambulatory Visit
Admission: RE | Admit: 2020-11-03 | Discharge: 2020-11-03 | Disposition: A | Discharge status: Home / Self Care | Payer: Managed Care, Other (non HMO) | Source: Ambulatory Visit | Attending: Radiation Oncology | Admitting: Radiation Oncology

## 2020-11-03 ENCOUNTER — Ambulatory Visit: Payer: Managed Care, Other (non HMO)

## 2020-11-03 DIAGNOSIS — Z170 Estrogen receptor positive status [ER+]: Secondary | ICD-10-CM | POA: Diagnosis present

## 2020-11-03 DIAGNOSIS — C50411 Malignant neoplasm of upper-outer quadrant of right female breast: Secondary | ICD-10-CM | POA: Insufficient documentation

## 2020-11-04 ENCOUNTER — Other Ambulatory Visit: Payer: Self-pay

## 2020-11-04 ENCOUNTER — Encounter: Payer: Self-pay | Admitting: Radiation Oncology

## 2020-11-04 ENCOUNTER — Ambulatory Visit
Admission: RE | Admit: 2020-11-04 | Discharge: 2020-11-04 | Disposition: A | Discharge status: Home / Self Care | Payer: Managed Care, Other (non HMO) | Source: Ambulatory Visit | Attending: Radiation Oncology | Admitting: Radiation Oncology

## 2020-11-04 DIAGNOSIS — C50411 Malignant neoplasm of upper-outer quadrant of right female breast: Secondary | ICD-10-CM | POA: Diagnosis not present

## 2020-11-10 NOTE — Progress Notes (Signed)
Patient Care Team: [name], MD as PCP - General (Hematology and Oncology) Mauro Kaufmann, RN as Oncology Nurse Navigator Rockwell Germany, RN as Oncology Nurse Navigator  DIAGNOSIS:    ICD-10-CM   1. Malignant neoplasm of upper-outer quadrant of right breast in female, estrogen receptor positive (Sabrina Woods)  C50.411    Z17.0       SUMMARY OF ONCOLOGIC HISTORY: Oncology History  Malignant neoplasm of upper-outer quadrant of right breast in female, estrogen receptor positive (Sabrina Woods)  03/10/2020 Initial Diagnosis   Palpable right breast mass x6 months.  Mammogram revealed left breast cysts (intraductal papilloma), 11 cm right breast mass and 2 masses 2.6 cm each in the right axilla: Biopsy grade 3 IDC ER 5% weak, PR 0%, Ki-67 25%, HER-2 3+ IHC positive   03/10/2020 Cancer Staging   Staging form: Breast, AJCC 8th Edition - Clinical stage from 03/10/2020: Stage IIIA (cT3, cN1, cM0, G3, ER+, PR-, HER2+) - Signed by [name], MD on 03/10/2020 Stage prefix: Initial diagnosis    03/19/2020 - 07/01/2020 Chemotherapy   TCH Perjeta        08/04/2020 Surgery   Right mastectomy (Dr. 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, high-grade DCIS with necrosis and calcifications, and 2 right axillary lymph nodes positive for metastatic carcinoma   08/21/2020 -  Chemotherapy   Patient is on Treatment Plan : BREAST ADO-Trastuzumab Emtansine (Kadcyla) q21d       CHIEF COMPLIANT: Kadcyla    INTERVAL HISTORY: Sabrina Woods is a 65 y.o. with above-mentioned history of right breast cancer having undergone right mastectomy and radiation therapy, currently on chemotherapy with Kadcyla today. She presents to the clinic today for treatment.  She is complaining of profound worsening of neuropathy over the past 1 week.  The neuropathy is very uncomfortable and keeps her up at night.  Fatigue continues to be a challenge but neuropathy is greatly affecting her  quality of life.  She has also felt some neuropathy in the hands as well.  She has not fallen or stumbled.  ALLERGIES:  has No Known Allergies.  MEDICATIONS:  Current Outpatient Medications  Medication Sig Dispense Refill   ALPRAZolam (XANAX) 0.25 MG tablet Take 1 tablet (0.25 mg total) by mouth 2 (two) times daily as needed for anxiety. 60 tablet 3   esomeprazole (NEXIUM) 20 MG capsule Take 20 mg by mouth daily.     gabapentin (NEURONTIN) 300 MG capsule Take 1 capsule (300 mg total) by mouth 2 (two) times daily. 180 capsule 3   Lactulose 20 GM/30ML SOLN Take 30 mLs (20 g total) by mouth 2 (two) times daily as needed. 473 mL 0   loratadine (CLARITIN) 10 MG tablet Take 10 mg by mouth daily as needed for rhinitis.     Multiple Vitamin (MULTIVITAMIN) capsule Take 1 capsule by mouth daily.     nicotine (NICODERM CQ - DOSED IN MG/24 HR) 7 mg/24hr patch PLACE 1 PATCH (7 MG TOTAL) ONTO THE SKIN DAILY. 28 patch 0   nicotine (NICODERM CQ) 14 mg/24hr patch Place 1 patch (14 mg total) onto the skin daily. 14 patch 0   No current facility-administered medications for this visit.    PHYSICAL EXAMINATION: ECOG PERFORMANCE STATUS: 1 - Symptomatic but completely ambulatory  Vitals:   11/11/20 1026  BP: (!) 179/84  Pulse: (!) 109  Resp: 18  Temp: (!) 97.3 F (36.3 C)  SpO2: 100%   Filed Weights   11/11/20 1026  Weight: 186 lb 1.6 oz (84.4 kg)      LABORATORY DATA:  I have reviewed the data as listed CMP Latest Ref Rng & Units 11/11/2020 10/22/2020 10/02/2020  Glucose 70 - 99 mg/dL 231(H) 212(H) 238(H)  BUN 8 - 23 mg/dL $Remove'9 8 9  'oTIVlif$ Creatinine 0.44 - 1.00 mg/dL 0.77 0.79 0.83  Sodium 135 - 145 mmol/L 132(L) 134(L) 139  Potassium 3.5 - 5.1 mmol/L 3.0(L) 3.5 3.8  Chloride 98 - 111 mmol/L 92(L) 98 105  CO2 22 - 32 mmol/L $RemoveB'27 25 24  'lSbBVqsj$ Calcium 8.9 - 10.3 mg/dL 9.7 10.1 9.9  Total Protein 6.5 - 8.1 g/dL 7.5 7.6 7.5  Total Bilirubin 0.3 - 1.2 mg/dL 0.5 0.4 0.3  Alkaline Phos 38 - 126 U/L 100 101 106   AST 15 - 41 U/L $Remo'30 28 25  'qzHoo$ ALT 0 - 44 U/L 36 28 21    Lab Results  Component Value Date   WBC 7.9 11/11/2020   HGB 13.3 11/11/2020   HCT 39.1 11/11/2020   MCV 87.7 11/11/2020   PLT 291 11/11/2020   NEUTROABS 5.9 11/11/2020    ASSESSMENT & PLAN:  Malignant neoplasm of upper-outer quadrant of right breast in female, estrogen receptor positive (Sabrina Woods) 08/04/20: Right mastectomy (Dr. 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 Neoadjuvant chemotherapy with TCH Perjeta 6 cycles followed by Kadcyla maintenance for 1 year 2. Followed by mastectomy with sentinel lymph node study 08/04/20 3. Followed by adjuvant radiation therapy 4.  Decided not to do antiestrogen therapy because her ER was 0% on final path. 5.  Consideration for neratinib ----------------------------------------------------------------------------------------------------------------- 03/15/20: Bone Scan: Subtle uptake in ribs: Non diagnostic 03/16/20: CT CAP: Large Rt breast mass with skin thickening, small sat lesion 1.8 cm, multiple enlarged LN, 4 mm LLL nodule ---------------------------------------------------------------------------------------------------------------- Current Treatment: Kadcyla q 3 weeks, today is cycle 5 Echocardiogram 06/29/2020: EF 65 to 70% we will obtain her next echocardiogram in a month. Adj XRT will start 09/18/2020   Kadcyla toxicities: Severe fatigue: Fatigue has finally started to subside Denies any nausea or vomiting.  She is able to eat regular food Severe constipation: Finally resolved  Chemo induced peripheral neuropathy: Acute worsening of neuropathy since the last 1 week.  I recommended discontinuation of Kadcyla at this time.  She understands that it might take several weeks to months before neuropathy can  get better.  She is currently on gabapentin and is taking 3 times a day.  We talked about Cymbalta as another option.  She will call us back in a few weeks to let us know about the neuropathy and if it is getting better then we will continue with the same.  If it is not getting better then we will consider adding Cymbalta or putting on on the clinical trial for neuropathy. Return to clinic in 3 months for survivorship care plan visit.    No orders of the defined types were placed in this encounter.  The patient has a good understanding of the overall plan. she agrees with it. she will call with any problems that may develop before the next visit here.  Total time spent: 30 mins including face to face time and time spent for planning, charting and coordination of care  Rulon Eisenmenger, MD, MPH 11/11/2020  I, Thana Ates, am acting as scribe for Dr. [name].  I have reviewed the above documentation for accuracy and completeness, and I agree with the above.

## 2020-11-11 ENCOUNTER — Inpatient Hospital Stay: Payer: Managed Care, Other (non HMO) | Attending: Hematology and Oncology

## 2020-11-11 ENCOUNTER — Inpatient Hospital Stay (HOSPITAL_BASED_OUTPATIENT_CLINIC_OR_DEPARTMENT_OTHER): Payer: Managed Care, Other (non HMO) | Admitting: Hematology and Oncology

## 2020-11-11 ENCOUNTER — Inpatient Hospital Stay: Payer: Managed Care, Other (non HMO)

## 2020-11-11 ENCOUNTER — Other Ambulatory Visit: Payer: Self-pay

## 2020-11-11 DIAGNOSIS — Z452 Encounter for adjustment and management of vascular access device: Secondary | ICD-10-CM | POA: Diagnosis not present

## 2020-11-11 DIAGNOSIS — Z170 Estrogen receptor positive status [ER+]: Secondary | ICD-10-CM

## 2020-11-11 DIAGNOSIS — C773 Secondary and unspecified malignant neoplasm of axilla and upper limb lymph nodes: Secondary | ICD-10-CM | POA: Diagnosis not present

## 2020-11-11 DIAGNOSIS — C50411 Malignant neoplasm of upper-outer quadrant of right female breast: Secondary | ICD-10-CM | POA: Diagnosis not present

## 2020-11-11 DIAGNOSIS — Z95828 Presence of other vascular implants and grafts: Secondary | ICD-10-CM

## 2020-11-11 LAB — CBC WITH DIFFERENTIAL (CANCER CENTER ONLY)
Abs Immature Granulocytes: 0.02 10*3/uL (ref 0.00–0.07)
Basophils Absolute: 0 10*3/uL (ref 0.0–0.1)
Basophils Relative: 1 %
Eosinophils Absolute: 0.1 10*3/uL (ref 0.0–0.5)
Eosinophils Relative: 1 %
HCT: 39.1 % (ref 36.0–46.0)
Hemoglobin: 13.3 g/dL (ref 12.0–15.0)
Immature Granulocytes: 0 %
Lymphocytes Relative: 14 %
Lymphs Abs: 1.1 10*3/uL (ref 0.7–4.0)
MCH: 29.8 pg (ref 26.0–34.0)
MCHC: 34 g/dL (ref 30.0–36.0)
MCV: 87.7 fL (ref 80.0–100.0)
Monocytes Absolute: 0.8 10*3/uL (ref 0.1–1.0)
Monocytes Relative: 10 %
Neutro Abs: 5.9 10*3/uL (ref 1.7–7.7)
Neutrophils Relative %: 74 %
Platelet Count: 291 10*3/uL (ref 150–400)
RBC: 4.46 MIL/uL (ref 3.87–5.11)
RDW: 13.7 % (ref 11.5–15.5)
WBC Count: 7.9 10*3/uL (ref 4.0–10.5)
nRBC: 0 % (ref 0.0–0.2)

## 2020-11-11 LAB — CMP (CANCER CENTER ONLY)
ALT: 36 U/L (ref 0–44)
AST: 30 U/L (ref 15–41)
Albumin: 3.8 g/dL (ref 3.5–5.0)
Alkaline Phosphatase: 100 U/L (ref 38–126)
Anion gap: 13 (ref 5–15)
BUN: 9 mg/dL (ref 8–23)
CO2: 27 mmol/L (ref 22–32)
Calcium: 9.7 mg/dL (ref 8.9–10.3)
Chloride: 92 mmol/L — ABNORMAL LOW (ref 98–111)
Creatinine: 0.77 mg/dL (ref 0.44–1.00)
GFR, Estimated: >60 mL/min (ref >60)
Glucose, Bld: 231 mg/dL — ABNORMAL HIGH (ref 70–99)
Potassium: 3 mmol/L — ABNORMAL LOW (ref 3.5–5.1)
Sodium: 132 mmol/L — ABNORMAL LOW (ref 135–145)
Total Bilirubin: 0.5 mg/dL (ref 0.3–1.2)
Total Protein: 7.5 g/dL (ref 6.5–8.1)

## 2020-11-11 MED ORDER — SODIUM CHLORIDE 0.9% FLUSH
10.0000 mL | Freq: Once | INTRAVENOUS | Status: AC
  Administered 2020-11-11: 10 mL

## 2020-11-11 NOTE — Assessment & Plan Note (Signed)
08/04/20:Right mastectomy (Dr. 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 Neoadjuvant chemotherapy with TCH Perjeta 6 cycles followed by Kadcyla maintenance for 1 year 2. Followed bymastectomywith sentinel lymph node study8/2/22 3. Followed by adjuvant radiation therapy 4.Decided not to do antiestrogen therapy because her ER was 0% on final path. 5.Consideration for neratinib ----------------------------------------------------------------------------------------------------------------- 03/15/20: Bone Scan: Subtle uptake in ribs: Non diagnostic 03/16/20: CT CAP: Large Rt breast mass with skin thickening, small sat lesion 1.8 cm, multiple enlarged LN, 4 mm LLL nodule ---------------------------------------------------------------------------------------------------------------- Current Treatment: Kadcyla q 3 weeks, today iscycle5 Echocardiogram 06/29/2020: EF 65 to 70% we will obtain her next echocardiogram in a month. Adj Deveron Furlong start9/16/2022  Kadcyla toxicities: Severe fatigue: Fatigue has finally started to subside Denies any nausea or vomiting. She is able to eat regular food Severe constipation: Patient had to use milk of magnesia but in spite of that it did not help.  Therefore I am sending her prescription for lactulose.  If it does move with lactulose, instructed her to take Colace every day and MiraLAX periodically to prevent such severe constipation.  RTC every 3 weeks for Kadcyla and every 6 weeks to follow-up with me.

## 2020-11-13 ENCOUNTER — Encounter: Payer: Self-pay | Admitting: Hematology and Oncology

## 2020-11-13 NOTE — Progress Notes (Signed)
                                                                                                                                                             Patient Name: Sabrina Woods MRN: [id number] DOB: Feb 09, 1955 Referring Physician: [name] (Profile Not Attached) Date of Service: 11/04/2020 [facility], [place]                                                        End Of Treatment Note  Diagnoses: C50.411-Malignant neoplasm of upper-outer quadrant of right female breast  Cancer Staging: Stage IIIA, cT3N1M0 grade 3, ER weakly positive, HER2 amplified invasive ductal carcinoma of the right breast  Intent: Curative  Radiation Treatment Dates: 09/17/2020 through 11/04/2020 Site Technique Total Dose (Gy) Dose per Fx (Gy) Completed Fx Beam Energies  Chest Wall, Right: CW_Rt 3D 50.4/50.4 1.8 28/28 6X, 10X  Chest Wall, Right: CW_Rt_SCLV 3D 50.4/50.4 1.8 28/28 10X  Chest Wall, Right: CW_Rt_Bst Electron 10/10 2 5/5 9E   Narrative: The patient tolerated radiation therapy relatively well. She developed fatigue and anticipated skin changes in the treatment field, but dry desquamation did become noted at the conclusion of her treatment.  Plan: The patient will receive a call in about one month from the radiation oncology department. She will continue follow up with Dr. Lindi Adie as well.   ________________________________________________    Carola Rhine, [facility]

## 2020-11-16 ENCOUNTER — Ambulatory Visit: Payer: Managed Care, Other (non HMO)

## 2020-11-17 ENCOUNTER — Ambulatory Visit: Payer: Managed Care, Other (non HMO) | Attending: General Surgery | Admitting: Rehabilitation

## 2020-11-17 ENCOUNTER — Encounter: Payer: Self-pay | Admitting: Rehabilitation

## 2020-11-17 ENCOUNTER — Other Ambulatory Visit: Payer: Self-pay

## 2020-11-17 DIAGNOSIS — Z483 Aftercare following surgery for neoplasm: Secondary | ICD-10-CM

## 2020-11-17 DIAGNOSIS — C50411 Malignant neoplasm of upper-outer quadrant of right female breast: Secondary | ICD-10-CM

## 2020-11-17 DIAGNOSIS — Z170 Estrogen receptor positive status [ER+]: Secondary | ICD-10-CM | POA: Insufficient documentation

## 2020-11-17 DIAGNOSIS — M25611 Stiffness of right shoulder, not elsewhere classified: Secondary | ICD-10-CM

## 2020-11-17 DIAGNOSIS — R293 Abnormal posture: Secondary | ICD-10-CM

## 2020-11-17 NOTE — Therapy (Signed)
[facility] @ [place], [place], [address] Phone: [phone]   Fax:  [phone]  Physical Therapy Treatment  Patient Details  Name: Sabrina Woods MRN: [id number] Date of Birth: 1955/11/22 Referring Provider (PT): Lindi Adie   Encounter Date: 11/17/2020   PT End of Session - 11/17/20 1321     Visit Number 10   screen only   PT Start Time 0233    PT Stop Time 1318    PT Time Calculation (min) 13 min    Activity Tolerance Patient tolerated treatment well    Behavior During Therapy [facility] for tasks assessed/performed             Past Medical History:  Diagnosis Date   Acid reflux    Anxiety    Breast cancer ([place])    Breast cancer ([place]) 2022   Depression    HISTORY OF   Fibroid    PONV (postoperative nausea and vomiting)    Smoker     Past Surgical History:  Procedure Laterality Date   CESAREAN SECTION     X 2   MASTECTOMY WITH AXILLARY LYMPH NODE DISSECTION Right 08/04/2020   Procedure: RIGHT MASTECTOMY WITH RIGHT AXILLARY LYMPH NODE DISSECTION;  Surgeon: Rolm Bookbinder, MD;  Location: [place];  Service: General;  Laterality: Right;   PORTACATH PLACEMENT Left 03/17/2020   Procedure: INSERTION PORT-A-CATH;  Surgeon: Rolm Bookbinder, MD;  Location: [place];  Service: General;  Laterality: Left;    There were no vitals filed for this visit.   Subjective Assessment - 11/17/20 1307     Subjective My neuropathy is really bad in the feet. No arm swelling    Pertinent History Pt palpated a lump in her right breast 6 months ago.  She was seen by her gynecologist who ordered Mammogram/ [place].  Cancer is located in the upper-outer quadrant of the right breast and is ER+.  She is presently having neoadjuvant chemo, and will have radiation following surgery and anti-estrogen.    Currently in Pain? No/denies                    L-DEX FLOWSHEETS - 11/17/20 1300       L-DEX  LYMPHEDEMA SCREENING   Measurement Type Unilateral    L-DEX MEASUREMENT EXTREMITY Upper Extremity    POSITION  Standing    DOMINANT SIDE Right    At Risk Side Right    BASELINE SCORE (UNILATERAL) -3.5    L-DEX SCORE (UNILATERAL) -5.6    VALUE CHANGE (UNILAT) -2.1                                     PT Long Term Goals - 10/06/20 0910       PT LONG TERM GOAL #1   Title Pts post surgical shoulder ROM and function will be equal to pre-surgical ROM    Time 22    Period Weeks    Status Achieved      PT LONG TERM GOAL #2   Title Pt will demonstrate 150 degrees of R shoulder abduction with no end range tightness across R pec to allow pt to return to PLOF    Baseline 145; 10/06/20- 170    Time 4    Period Weeks    Status Achieved      PT LONG TERM GOAL #3   Title  Pt will demonstrate 90 degrees of R shoulder external rotation to allow her to return to PLOF.    Baseline 77; 10/06/20- 80    Time 4    Period Weeks    Status On-going      PT LONG TERM GOAL #4   Title Pt will be independent in a home exercise program for long term stretching and strengthening    Time 6    Period Weeks    Status On-going      PT LONG TERM GOAL #5   Title Pt will be able to verbalize lymphedema risk reduction practices    Baseline 10/06/20- pt educated in this today    Time 6    Period Weeks    Status Achieved                   Plan - 11/17/20 1321     Clinical Impression Statement Pt returns for SOZO screening which is WNL at this point.  Scheduled for 7month follow up.  Discussed returning to PT if neuropathy does not improve with stopping the Kadcyla.    PT Next Visit Plan 6 month SOZO    Consulted and Agree with Plan of Care Patient             Patient will benefit from skilled therapeutic intervention in order to improve the following deficits and impairments:     Visit Diagnosis: Stiffness of right shoulder, not elsewhere classified  Aftercare  following surgery for neoplasm  Abnormal posture  Malignant neoplasm of upper-outer quadrant of right breast in female, estrogen receptor positive [facility])     Problem List Patient Active Problem List   Diagnosis Date Noted   S/P mastectomy, right 08/04/2020   Chemotherapy-induced peripheral neuropathy ([place]) 05/20/2020   Port-A-Cath in place 03/19/2020   Malignant neoplasm of upper-outer quadrant of right breast in female, estrogen receptor positive ([place]) 03/10/2020   Fibroid    Smoker    Depression     Stark Bray, PT 11/17/2020, 1:23 PM  [facility] @ [place], [place], [address] Phone: [phone]   Fax:  [phone]  Name: Sabrina Woods MRN: [id number] Date of Birth: 1955-12-18

## 2020-11-23 ENCOUNTER — Other Ambulatory Visit: Payer: Self-pay | Admitting: Hematology and Oncology

## 2020-11-23 ENCOUNTER — Telehealth: Payer: Self-pay | Admitting: *Deleted

## 2020-11-23 MED ORDER — DULOXETINE HCL 20 MG PO CPEP: 20.0000 mg | ORAL_CAPSULE | Freq: Every day | ORAL | 3 refills | Status: DC

## 2020-11-23 NOTE — Telephone Encounter (Signed)
Received call from pt with complaint of worsening neuropathy in bilateral fingers and toes which causes her to not sleep at night due to the pain.  Pt states she is currently taking Gabapentin 300 mg TID with no relief. RN will review with MD for further recommendations.

## 2020-11-23 NOTE — Progress Notes (Signed)
Patient called in complaining about worsening peripheral neuropathy. She is having a hard time sleeping. Therefore sent a prescription for Cymbalta 20 mg daily.  She will take this along with gabapentin. Will do a telephone visit in 1 month to discuss tolerance.

## 2020-11-24 ENCOUNTER — Telehealth: Payer: Self-pay | Admitting: Hematology and Oncology

## 2020-11-24 NOTE — Telephone Encounter (Signed)
Scheduled per sch msg. Called and spoke with patient. Confirmed appt  

## 2020-11-30 ENCOUNTER — Telehealth: Payer: Self-pay | Admitting: *Deleted

## 2020-11-30 ENCOUNTER — Ambulatory Visit
Admission: RE | Admit: 2020-11-30 | Discharge: 2020-11-30 | Disposition: A | Discharge status: Home / Self Care | Payer: Managed Care, Other (non HMO) | Source: Ambulatory Visit | Attending: Radiation Oncology | Admitting: Radiation Oncology

## 2020-11-30 ENCOUNTER — Encounter: Payer: Self-pay | Admitting: *Deleted

## 2020-11-30 DIAGNOSIS — Z170 Estrogen receptor positive status [ER+]: Secondary | ICD-10-CM

## 2020-11-30 NOTE — Telephone Encounter (Signed)
Received call from pt with complaint of decreased appetite and brain fog x 1 week since starting Cymbalta.  Per MD these are typical symptoms pts experience when first starting Cymbalta.  Pt educated to call the office if symptoms become worse prior to MD f/u in 2 weeks.  Pt verbalized understanding.  Pt also requesting work excuse to be emailed to [email or web address] to extend work leave until 01/03/21.  Letter successfully emailed to pt.

## 2020-11-30 NOTE — Progress Notes (Signed)
  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 During treatment she did very well with radiotherapy and did develop dry desquamation. She reports she feels like her skin has improved. Her fatigue has also improved, but she states she's been taking cymbalta for peripheral neuropathy from chemotherapy. This has made her feel like a zombie and her appetite has been quite poor in the last week. She would like to discuss this with Dr. Geralyn Flash team.  Impression/Plan: 1. 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 The patient has been doing well since completion of radiotherapy. We discussed that we would be happy to continue to follow her as needed, but she will also continue to follow up with Dr. Lindi Adie in medical oncology. She was counseled on skin care as well as measures to avoid sun exposure to this area.  2. Survivorship. We discussed the importance of survivorship evaluation and encouraged her to attend her upcoming visit with that clinic. 3. Side effects of Cymbalta. I transferred her up to medical oncology so she can discuss her concerns with Dr. Geralyn Flash nurse.    Carola Rhine, PAC

## 2020-12-03 ENCOUNTER — Other Ambulatory Visit: Payer: Managed Care, Other (non HMO)

## 2020-12-03 ENCOUNTER — Ambulatory Visit: Payer: Managed Care, Other (non HMO) | Admitting: Hematology and Oncology

## 2020-12-03 ENCOUNTER — Ambulatory Visit: Payer: Managed Care, Other (non HMO)

## 2020-12-14 NOTE — Progress Notes (Signed)
HEMATOLOGY-ONCOLOGY TELEPHONE VISIT PROGRESS NOTE  I connected with Sabrina Woods on 12/15/2020 at 10:45 AM EST by telephone and verified that I am speaking with the correct person using two identifiers.  I discussed the limitations, risks, security and privacy concerns of performing an evaluation and management service by telephone and the availability of in person appointments.  I also discussed with the patient that there may be a patient responsible charge related to this service. The patient expressed understanding and agreed to proceed.   History of Present Illness: Sabrina Woods is a 65 y.o. female with above-mentioned history of right breast cancer having undergone right mastectomy and radiation therapy, currently on chemotherapy with Kadcyla today. She presents via telephone today for follow-up.  Oncology History  Malignant neoplasm of upper-outer quadrant of right breast in female, estrogen receptor positive ([place])  03/10/2020 Initial Diagnosis   Palpable right breast mass x6 months.  Mammogram revealed left breast cysts (intraductal papilloma), 11 cm right breast mass and 2 masses 2.6 cm each in the right axilla: Biopsy grade 3 IDC ER 5% weak, PR 0%, Ki-67 25%, HER-2 3+ IHC positive   03/10/2020 Cancer Staging   Staging form: Breast, AJCC 8th Edition - Clinical stage from 03/10/2020: Stage IIIA (cT3, cN1, cM0, G3, ER+, PR-, HER2+) - Signed by [name], MD on 03/10/2020 Stage prefix: Initial diagnosis    03/19/2020 - 07/01/2020 Chemotherapy   TCH Perjeta        08/04/2020 Surgery   Right mastectomy (Dr. 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, high-grade DCIS with necrosis and calcifications, and 2 right axillary lymph nodes positive for metastatic carcinoma   08/21/2020 - 10/23/2020 Chemotherapy   Patient is on Treatment Plan : BREAST ADO-Trastuzumab Emtansine (Kadcyla) q21d       Observations/Objective:      Assessment Plan:  Malignant neoplasm of upper-outer quadrant of right breast in female, estrogen receptor positive ([place]) 08/04/20: Right mastectomy (Dr. 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 Neoadjuvant chemotherapy with TCH Perjeta 6 cycles followed by Kadcyla maintenance for 1 year 2. Followed by mastectomy with sentinel lymph node study 08/04/20 3. Followed by adjuvant radiation therapy 4.  Decided not to do antiestrogen therapy because her ER was 0% on final path. 5.  Consideration for neratinib ----------------------------------------------------------------------------------------------------------------- 03/15/20: Bone Scan: Subtle uptake in ribs: Non diagnostic 03/16/20: CT CAP: Large Rt breast mass with skin thickening, small sat lesion 1.8 cm, multiple enlarged LN, 4 mm LLL nodule ---------------------------------------------------------------------------------------------------------------- Current Treatment: Kadcyla q 3 weeks, discontinued 11/11/2020 because of peripheral neuropathy Echocardiogram 06/29/2020: EF 65 to 70% we will obtain her next echocardiogram in a month. Adj XRT will start 09/18/2020   Chemo induced peripheral neuropathy: Currently on gabapentin 3 times a day.  Added Cymbalta after the initial 3 weeks of feeling zombielike in the head she is doing much better from Cymbalta.  Neuropathy is only slightly better.  Return to clinic for survivorship care plan visit    I discussed the assessment and treatment plan with the patient. The patient was provided an opportunity to ask questions and all were answered. The patient agreed with the plan and demonstrated an understanding of the instructions. The patient was advised to call back or seek an in-person evaluation if the symptoms worsen  or if the condition fails to improve  as anticipated.   Total time spent: 30 mins including non-face to face time and time spent for planning, charting and coordination of care  Rulon Eisenmenger, MD 12/15/2020    I, Thana Ates, am acting as scribe for [name], MD.  I have reviewed the above documentation for accuracy and completeness, and I agree with the above.

## 2020-12-14 NOTE — Assessment & Plan Note (Signed)
08/04/20:Right mastectomy (Dr. 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 Neoadjuvant chemotherapy with TCH Perjeta 6 cycles followed by Kadcyla maintenance for 1 year 2. Followed bymastectomywith sentinel lymph node study8/2/22 3. Followed by adjuvant radiation therapy 4.Decided not to do antiestrogen therapy because her ER was 0% on final path. 5.Consideration for neratinib ----------------------------------------------------------------------------------------------------------------- 03/15/20: Bone Scan: Subtle uptake in ribs: Non diagnostic 03/16/20: CT CAP: Large Rt breast mass with skin thickening, small sat lesion 1.8 cm, multiple enlarged LN, 4 mm LLL nodule ---------------------------------------------------------------------------------------------------------------- Current Treatment: Kadcyla q 3 weeks, discontinued 11/11/2020 because of peripheral neuropathy Echocardiogram 06/29/2020: EF 65 to 70% we will obtain her next echocardiogram in a month. Adj XRTwill start9/16/2022  Chemo induced peripheral neuropathy: Currently on gabapentin 3 times a day.  (We discussed adding Cymbalta if necessary)  Return to clinic for survivorship care plan visit

## 2020-12-15 ENCOUNTER — Inpatient Hospital Stay: Payer: Managed Care, Other (non HMO) | Attending: Hematology and Oncology | Admitting: Hematology and Oncology

## 2020-12-15 DIAGNOSIS — C50411 Malignant neoplasm of upper-outer quadrant of right female breast: Secondary | ICD-10-CM

## 2020-12-15 DIAGNOSIS — Z170 Estrogen receptor positive status [ER+]: Secondary | ICD-10-CM

## 2020-12-24 ENCOUNTER — Ambulatory Visit: Payer: Managed Care, Other (non HMO) | Admitting: Hematology and Oncology

## 2020-12-24 ENCOUNTER — Ambulatory Visit: Payer: Managed Care, Other (non HMO)

## 2020-12-24 ENCOUNTER — Other Ambulatory Visit: Payer: Managed Care, Other (non HMO)

## 2021-01-07 ENCOUNTER — Encounter: Payer: Self-pay | Admitting: *Deleted

## 2021-01-07 ENCOUNTER — Telehealth: Payer: Self-pay | Admitting: *Deleted

## 2021-01-07 NOTE — Telephone Encounter (Signed)
Received call from pt with complaint of worsening neuropathy bilateral hands and feet.  Pt states she is currently taking Gabapentin 300 mg TID as well as Cymbalta 20 mg daily with no relief.  Per MD pt able to take OTC PEA supplement. Pt educated and encouraged to look into the supplement.  MD also states pt needing to be sent to physical therapy for TENS unit therapy and Neuropathy therapy.  Pt states she would like to proceed with PEA supplement first and then try physical therapy if symptoms do not resolve.

## 2021-01-14 ENCOUNTER — Ambulatory Visit: Payer: Managed Care, Other (non HMO)

## 2021-01-14 ENCOUNTER — Ambulatory Visit: Payer: Managed Care, Other (non HMO) | Admitting: Hematology and Oncology

## 2021-01-14 ENCOUNTER — Other Ambulatory Visit: Payer: Managed Care, Other (non HMO)

## 2021-01-14 ENCOUNTER — Encounter: Payer: Self-pay | Admitting: *Deleted

## 2021-01-14 DIAGNOSIS — Z170 Estrogen receptor positive status [ER+]: Secondary | ICD-10-CM

## 2021-01-14 DIAGNOSIS — C50411 Malignant neoplasm of upper-outer quadrant of right female breast: Secondary | ICD-10-CM

## 2021-02-10 ENCOUNTER — Encounter: Payer: Self-pay | Admitting: Adult Health

## 2021-02-10 ENCOUNTER — Inpatient Hospital Stay: Payer: Managed Care, Other (non HMO) | Attending: Hematology and Oncology | Admitting: Adult Health

## 2021-02-10 ENCOUNTER — Other Ambulatory Visit: Payer: Self-pay

## 2021-02-10 ENCOUNTER — Inpatient Hospital Stay (HOSPITAL_BASED_OUTPATIENT_CLINIC_OR_DEPARTMENT_OTHER): Payer: Managed Care, Other (non HMO)

## 2021-02-10 VITALS — BP 133/77 | HR 102 | Temp 97.5°F | Resp 18 | Ht 66.0 in | Wt 171.2 lb

## 2021-02-10 DIAGNOSIS — Z9011 Acquired absence of right breast and nipple: Secondary | ICD-10-CM | POA: Insufficient documentation

## 2021-02-10 DIAGNOSIS — Z23 Encounter for immunization: Secondary | ICD-10-CM | POA: Diagnosis not present

## 2021-02-10 DIAGNOSIS — T451X5A Adverse effect of antineoplastic and immunosuppressive drugs, initial encounter: Secondary | ICD-10-CM | POA: Insufficient documentation

## 2021-02-10 DIAGNOSIS — Z1211 Encounter for screening for malignant neoplasm of colon: Secondary | ICD-10-CM

## 2021-02-10 DIAGNOSIS — C50411 Malignant neoplasm of upper-outer quadrant of right female breast: Secondary | ICD-10-CM | POA: Insufficient documentation

## 2021-02-10 DIAGNOSIS — R911 Solitary pulmonary nodule: Secondary | ICD-10-CM | POA: Insufficient documentation

## 2021-02-10 DIAGNOSIS — F17210 Nicotine dependence, cigarettes, uncomplicated: Secondary | ICD-10-CM | POA: Diagnosis not present

## 2021-02-10 DIAGNOSIS — G620 Drug-induced polyneuropathy: Secondary | ICD-10-CM | POA: Insufficient documentation

## 2021-02-10 DIAGNOSIS — Z79899 Other long term (current) drug therapy: Secondary | ICD-10-CM | POA: Insufficient documentation

## 2021-02-10 DIAGNOSIS — Z170 Estrogen receptor positive status [ER+]: Secondary | ICD-10-CM | POA: Insufficient documentation

## 2021-02-10 DIAGNOSIS — Z1231 Encounter for screening mammogram for malignant neoplasm of breast: Secondary | ICD-10-CM | POA: Diagnosis not present

## 2021-02-10 MED ORDER — PNEUMOCOCCAL 20-VAL CONJ VACC 0.5 ML IM SUSY
0.5000 mL | PREFILLED_SYRINGE | Freq: Once | INTRAMUSCULAR | Status: AC
Start: 2021-02-10 — End: 2021-02-10
  Administered 2021-02-10: 0.5 mL via INTRAMUSCULAR
  Filled 2021-02-10: qty 0.5

## 2021-02-10 NOTE — Progress Notes (Signed)
SURVIVORSHIP VISIT:   BRIEF ONCOLOGIC HISTORY:  Oncology History  Malignant neoplasm of upper-outer quadrant of right breast in female, estrogen receptor positive ([place])  03/10/2020 Initial Diagnosis   Palpable right breast mass x6 months.  Mammogram revealed left breast cysts (intraductal papilloma), 11 cm right breast mass and 2 masses 2.6 cm each in the right axilla: Biopsy grade 3 IDC ER 5% weak, PR 0%, Ki-67 25%, HER-2 3+ IHC positive   03/10/2020 Cancer Staging   Staging form: Breast, AJCC 8th Edition - Clinical stage from 03/10/2020: Stage IIIA (cT3, cN1, cM0, G3, ER+, PR-, HER2+) - Signed by [name], MD on 03/10/2020 Stage prefix: Initial diagnosis    03/19/2020 - 07/01/2020 Chemotherapy   TCH Perjeta        08/04/2020 Surgery   Right mastectomy (Dr. 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, high-grade DCIS with necrosis and calcifications, and 2 right axillary lymph nodes positive for metastatic carcinoma   08/04/2020 Cancer Staging   Staging form: Breast, AJCC 8th Edition - Pathologic stage from 08/04/2020: No Stage Recommended (ypTis (DCIS), pN2, cM0) - Signed by Gardenia Phlegm, NP on 02/05/2021 Stage prefix: Post-therapy    08/21/2020 - 10/23/2020 Chemotherapy   Discontinued due to peripheral neuropathy Patient is on Treatment Plan : BREAST ADO-Trastuzumab Emtansine (Kadcyla) q21d      09/17/2020 - 11/04/2020 Radiation Therapy   Site Technique Total Dose (Gy) Dose per Fx (Gy) Completed Fx Beam Energies  Chest Wall, Right: CW_Rt 3D 50.4/50.4 1.8 28/28 6X, 10X  Chest Wall, Right: CW_Rt_SCLV 3D 50.4/50.4 1.8 28/28 10X  Chest Wall, Right: CW_Rt_Bst Electron 10/10 2 5/5 9E       INTERVAL HISTORY:  Sabrina Woods to review her survivorship care plan detailing her treatment course for breast cancer, as well as monitoring long-term side effects of that treatment, education regarding health maintenance, screening, and overall  wellness and health promotion.     Overall, Sabrina Woods reports doing moderately well.  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  This happens in her bilateral soles of her feet and also in her fingertips.  She notes that it is very uncomfortable.  She is taking gabapentin 300 mg p.o. twice daily for this and also Cymbalta 20 mg daily for this as well.  She continues to struggle.  Her husband wants to try CBD oil one drop orally to see if it may help.  He received it from a reputable company.      REVIEW OF SYSTEMS:  Review of Systems  Constitutional:  Negative for appetite change, chills, fatigue, fever and unexpected weight change.  HENT:   Negative for hearing loss, lump/mass and trouble swallowing.   Eyes:  Negative for eye problems and icterus.  Respiratory:  Negative for chest tightness, cough and shortness of breath.   Cardiovascular:  Negative for chest pain, leg swelling and palpitations.  Gastrointestinal:  Negative for abdominal distention, abdominal pain, constipation, diarrhea, nausea and vomiting.  Endocrine: Negative for hot flashes.  Genitourinary:  Negative for difficulty urinating.   Musculoskeletal:  Negative for arthralgias.  Skin:  Negative for itching and rash.  Neurological:  Positive for numbness. Negative for dizziness, extremity weakness and headaches.  Hematological:  Negative for adenopathy. Does not bruise/bleed easily.  Psychiatric/Behavioral:  Negative for depression. The patient is not nervous/anxious.   Breast: Denies any new nodularity, masses, tenderness, nipple changes, or nipple discharge.      ONCOLOGY TREATMENT TEAM:  1. Surgeon:  Dr. Donne Hazel at [facility] 2. Medical Oncologist: Dr. Lindi Adie  3. Radiation Oncologist: Dr. Lisbeth Renshaw    PAST MEDICAL/SURGICAL HISTORY:  Past Medical History:  Diagnosis Date   Acid reflux    Anxiety    Breast cancer ([place])    Breast cancer ([place]) 2022   Depression    HISTORY  OF   Fibroid    PONV (postoperative nausea and vomiting)    Smoker    Past Surgical History:  Procedure Laterality Date   CESAREAN SECTION     X 2   MASTECTOMY WITH AXILLARY LYMPH NODE DISSECTION Right 08/04/2020   Procedure: RIGHT MASTECTOMY WITH RIGHT AXILLARY LYMPH NODE DISSECTION;  Surgeon: Rolm Bookbinder, MD;  Location: [facility];  Service: General;  Laterality: Right;   PORTACATH PLACEMENT Left 03/17/2020   Procedure: INSERTION PORT-A-CATH;  Surgeon: Rolm Bookbinder, MD;  Location: [facility];  Service: General;  Laterality: Left;     ALLERGIES:  No Known Allergies   CURRENT MEDICATIONS:  Outpatient Encounter Medications as of 02/10/2021  Medication Sig   ALPRAZolam (XANAX) 0.25 MG tablet Take 1 tablet (0.25 mg total) by mouth 2 (two) times daily as needed for anxiety.   DULoxetine (CYMBALTA) 20 MG capsule Take 1 capsule (20 mg total) by mouth daily.   esomeprazole (NEXIUM) 20 MG capsule Take 20 mg by mouth daily.   gabapentin (NEURONTIN) 300 MG capsule Take 1 capsule (300 mg total) by mouth 2 (two) times daily.   Lactulose 20 GM/30ML SOLN Take 30 mLs (20 g total) by mouth 2 (two) times daily as needed.   loratadine (CLARITIN) 10 MG tablet Take 10 mg by mouth daily as needed for rhinitis.   Multiple Vitamin (MULTIVITAMIN) capsule Take 1 capsule by mouth daily.   nicotine (NICODERM CQ - DOSED IN MG/24 HR) 7 mg/24hr patch PLACE 1 PATCH (7 MG TOTAL) ONTO THE SKIN DAILY.   nicotine (NICODERM CQ) 14 mg/24hr patch Place 1 patch (14 mg total) onto the skin daily.   [DISCONTINUED] prochlorperazine (COMPAZINE) 10 MG tablet Take 1 tablet (10 mg total) by mouth every 6 (six) hours as needed (Nausea or vomiting).   No facility-administered encounter medications on file as of 02/10/2021.     ONCOLOGIC FAMILY HISTORY:  Family History  Problem Relation Age of Onset   Breast cancer Mother    Diabetes Mother    Hypertension Mother    Hypertension Father    Hypertension  Sister    Hypertension Brother      GENETIC COUNSELING/TESTING: Not at this time  SOCIAL HISTORY:  Social History   Socioeconomic History   Marital status: Married    Spouse name: Not on file   Number of children: Not on file   Years of education: Not on file   Highest education level: Not on file  Occupational History   Not on file  Tobacco Use   Smoking status: Every Day    Packs/day: 0.50    Types: Cigarettes   Smokeless tobacco: Never  Substance and Sexual Activity   Alcohol use: Yes    Comment: occasionally   Drug use: Not on file   Sexual activity: Yes    Birth control/protection: Post-menopausal  Other Topics Concern   Not on file  Social History Narrative   Not on file   Social Determinants of Health   Financial Resource Strain: Low Risk    Difficulty of Paying Living Expenses: Not hard at all  Food Insecurity: No Food Insecurity  Worried About Charity fundraiser in the Last Year: Never true   [place] in the Last Year: Never true  Transportation Needs: No Transportation Needs   Lack of Transportation (Medical): No   Lack of Transportation (Non-Medical): No  Physical Activity: Not on file  Stress: Not on file  Social Connections: Not on file  Intimate Partner Violence: Not on file     OBSERVATIONS/OBJECTIVE:  BP 133/77 (BP Location: Left Arm, Patient Position: Sitting)    Pulse (!) 102    Temp (!) 97.5 F (36.4 C) (Temporal)    Resp 18    Ht _0  (1.676 m)    Wt 171 lb 3.2 oz (77.7 kg)    SpO2 99%    BMI 27.63 kg/m  GENERAL: Patient is a well appearing female in no acute distress HEENT:  Sclerae anicteric.  Oropharynx clear and moist. No ulcerations or evidence of oropharyngeal candidiasis. Neck is supple.  NODES:  No cervical, supraclavicular, or axillary lymphadenopathy palpated.  BREAST EXAM:  right breast s/p mastectomy and radiation, no sign of local recurrence, left breast benign LUNGS:  Clear to auscultation bilaterally.  No wheezes  or rhonchi. HEART:  Regular rate and rhythm. No murmur appreciated. ABDOMEN:  Soft, nontender.  Positive, normoactive bowel sounds. No organomegaly palpated. MSK:  No focal spinal tenderness to palpation. Full range of motion bilaterally in the upper extremities. EXTREMITIES:  No peripheral edema.   SKIN:  Clear with no obvious rashes or skin changes. No nail dyscrasia. NEURO:  Nonfocal. Well oriented.  Appropriate affect.   LABORATORY DATA:  None for this visit.  DIAGNOSTIC IMAGING:  None for this visit.      ASSESSMENT AND PLAN:  Ms.. Woods is a pleasant 66 y.o. 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 She presents to the Survivorship Clinic for our initial meeting and routine follow-up post-completion of treatment for breast cancer.    1. Stage IIIA right   breast cancer:  Sabrina Woods is continuing to recover from definitive treatment for breast cancer. She will follow-up with her medical oncologist, Dr. Lindi Adie in 6 months with history and physical exam per surveillance protocol. Her mammogram is due 02/24/2021; orders placed today. Today, a comprehensive survivorship care plan and treatment summary was reviewed with the patient today detailing her breast cancer diagnosis, treatment course, potential late/long-term effects of treatment, appropriate follow-up care with recommendations for the future, and patient education resources.  A copy of this summary, along with a letter will be sent to the patients primary care provider via mail/fax/In Basket message after todays visit.    2. Lung nodule: This was noted on CT scan last year.  We are repeating CT chest to further evaluate.  She is a heavy smoker of 50 pack years.    3. Chemotherapy induced peripheral neuropathy: I placed a PT referral to see if they can help with her  numbness.  I reviewed her gabapentin and cymbalta which are at low doses. If she wants to try one gtt of CBD oil to see if it might help, I think that is ok.  She will need labs prior to CT scan, so we can evaluate CMP at that time.    4. Bone health:    She was given education on specific activities to promote bone health.  5. Cancer screening:  Due to Sabrina Woods's history and  her age, she should receive screening for skin cancers, colon cancer, and gynecologic cancers.  She does not have a PCP.  I referred her for colonoscopy and internal medicine with [facility]. The information and recommendations are listed on the patient's comprehensive care plan/treatment summary and were reviewed in detail with the patient.    6. Health maintenance and wellness promotion: Sabrina Woods was encouraged to consume 5-7 servings of fruits and vegetables per day. We reviewed the "Nutrition Rainbow" handout.  She was also encouraged to engage in moderate to vigorous exercise for 30 minutes per day most days of the week. We discussed the [employer] fitness program, which is designed for cancer survivors to help them become more physically fit after cancer treatments.  She was instructed to limit her alcohol consumption and continue to abstain from tobacco use.     7. Support services/counseling: It is not uncommon for this period of the patient's cancer care trajectory to be one of many emotions and stressors. She was given information regarding our available services and encouraged to contact me with any questions or for help enrolling in any of our support group/programs.    Follow up instructions:    -Return to cancer center in 6 months for f/u with Dr. Lindi Adie  -Mammogram due  -Referral to PT -Referral to GI for colonoscopy screening -Referral to Primary care -CT chest to evaluate lung nodule -Follow up with surgery 1 year -She is welcome to return back to the Survivorship Clinic at any time; no  additional follow-up needed at this time.  -Consider referral back to survivorship as a long-term survivor for continued surveillance  The patient was provided an opportunity to ask questions and all were answered. The patient agreed with the plan and demonstrated an understanding of the instructions.   Total encounter time: 60 minutes in face to face visit time, chart review, lab review, care coordination, and documentation of the encounter.     Wilber Bihari, NP 02/10/21 11:12 AM Medical Oncology and Hematology [facility] [place], [place] [address] Tel. [phone]    Fax. [phone]  *Total Encounter Time as defined by the Centers for Medicare and Medicaid Services includes, in addition to the face-to-face time of a patient visit (documented in the note above) non-face-to-face time: obtaining and reviewing outside history, ordering and reviewing medications, tests or procedures, care coordination (communications with other health care professionals or caregivers) and documentation in the medical record.

## 2021-02-11 ENCOUNTER — Telehealth: Payer: Self-pay | Admitting: Adult Health

## 2021-02-11 NOTE — Telephone Encounter (Signed)
Scheduled appointment per 02/08 los. Patient aware.

## 2021-02-12 ENCOUNTER — Encounter: Payer: Self-pay | Admitting: Adult Health

## 2021-02-15 ENCOUNTER — Telehealth: Payer: Self-pay | Admitting: *Deleted

## 2021-02-15 ENCOUNTER — Ambulatory Visit: Payer: Managed Care, Other (non HMO) | Attending: General Surgery

## 2021-02-15 ENCOUNTER — Other Ambulatory Visit: Payer: Self-pay

## 2021-02-15 VITALS — Wt 172.1 lb

## 2021-02-15 DIAGNOSIS — Z483 Aftercare following surgery for neoplasm: Secondary | ICD-10-CM | POA: Insufficient documentation

## 2021-02-15 NOTE — Telephone Encounter (Signed)
Received call from pt with complaint of ongoing neuropathy despite Cymbalta and Gabapentin.  Pt and spouse requesting virtual visit with MD to discuss further treatment.  Appt scheduled and pt verbalized understanding.

## 2021-02-15 NOTE — Progress Notes (Signed)
HEMATOLOGY-ONCOLOGY [facility] VIDEO VISIT PROGRESS NOTE  I connected with Sabrina Woods on 02/16/2021 at  3:30 PM EST by MyChart video conference and verified that I am speaking with the correct person using two identifiers.  I discussed the limitations, risks, security and privacy concerns of performing an evaluation and management service by MyChart and the availability of in person appointments.  I also discussed with the patient that there may be a patient responsible charge related to this service. The patient expressed understanding and agreed to proceed.  Patient's Location: Home Physician Location: Clinic  CHIEF COMPLIANT: Follow-up of right breast cancer  INTERVAL HISTORY: Sabrina Woods is a 66 y.o. female with above-mentioned history of right breast cancer having undergone right mastectomy and radiation therapy, currently on chemotherapy with Kadcyla today. She presents via MyChart today for follow-up.  She is complaining of continued and persistent neuropathy symptoms.  She is currently on gabapentin 300 3 times daily along with Cymbalta and PEA.  In spite of all this she continues to suffer from pain and discomfort in the fingers and toes.  Oncology History  Malignant neoplasm of upper-outer quadrant of right breast in female, estrogen receptor positive ([place])  03/10/2020 Initial Diagnosis   Palpable right breast mass x6 months.  Mammogram revealed left breast cysts (intraductal papilloma), 11 cm right breast mass and 2 masses 2.6 cm each in the right axilla: Biopsy grade 3 IDC ER 5% weak, PR 0%, Ki-67 25%, HER-2 3+ IHC positive   03/10/2020 Cancer Staging   Staging form: Breast, AJCC 8th Edition - Clinical stage from 03/10/2020: Stage IIIA (cT3, cN1, cM0, G3, ER+, PR-, HER2+) - Signed by [name], MD on 03/10/2020 Stage prefix: Initial diagnosis    03/19/2020 - 07/01/2020 Chemotherapy   TCH Perjeta   08/04/2020 Surgery   Right mastectomy (Dr. Rolm Bookbinder): grade 3, 8.5  cm invasive ductal carcinoma with micropapillary features, high-grade DCIS with necrosis and calcifications, and 2 right axillary lymph nodes positive for metastatic carcinoma   08/04/2020 Cancer Staging   Staging form: Breast, AJCC 8th Edition - Pathologic stage from 08/04/2020: No Stage Recommended (ypTis (DCIS), pN2, cM0) - Signed by Gardenia Phlegm, NP on 02/05/2021 Stage prefix: Post-therapy    08/21/2020 - 10/23/2020 Chemotherapy   Discontinued due to peripheral neuropathy Patient is on Treatment Plan : BREAST ADO-Trastuzumab Emtansine (Kadcyla) q21d      10/06/2020 - 11/04/2020 Radiation Therapy   Adjuvant radiation     Observations/Objective:  There were no vitals filed for this visit. There is no height or weight on file to calculate BMI.  I have reviewed the data as listed CMP Latest Ref Rng & Units 11/11/2020 10/22/2020 10/02/2020  Glucose 70 - 99 mg/dL 231(H) 212(H) 238(H)  BUN 8 - 23 mg/dL $Remove'9 8 9  'DQwrLPY$ Creatinine 0.44 - 1.00 mg/dL 0.77 0.79 0.83  Sodium 135 - 145 mmol/L 132(L) 134(L) 139  Potassium 3.5 - 5.1 mmol/L 3.0(L) 3.5 3.8  Chloride 98 - 111 mmol/L 92(L) 98 105  CO2 22 - 32 mmol/L $RemoveB'27 25 24  'uKvucnMX$ Calcium 8.9 - 10.3 mg/dL 9.7 10.1 9.9  Total Protein 6.5 - 8.1 g/dL 7.5 7.6 7.5  Total Bilirubin 0.3 - 1.2 mg/dL 0.5 0.4 0.3  Alkaline Phos 38 - 126 U/L 100 101 106  AST 15 - 41 U/L $Remo'30 28 25  'VSMkr$ ALT 0 - 44 U/L 36 28 21    Lab Results  Component Value Date   WBC 7.9 11/11/2020   HGB 13.3 11/11/2020  HCT 39.1 11/11/2020   MCV 87.7 11/11/2020   PLT 291 11/11/2020   NEUTROABS 5.9 11/11/2020      Assessment Plan:  Malignant neoplasm of upper-outer quadrant of right breast in female, estrogen receptor positive ([place]) 08/04/20: Right mastectomy (Dr. 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  Neoadjuvant chemotherapy with TCH Perjeta 6 cycles followed by Kadcyla maintenance for 1 year 2. Followed by mastectomy with sentinel lymph node study 08/04/20 3. Followed by adjuvant radiation therapy completed 11/04/2020 4.  Decided not to do antiestrogen therapy because her ER was 0% on final path. 5.  Consideration for neratinib ----------------------------------------------------------------------------------------------------------------- 03/15/20: Bone Scan: Subtle uptake in ribs: Non diagnostic 03/16/20: CT CAP: Large Rt breast mass with skin thickening, small sat lesion 1.8 cm, multiple enlarged LN, 4 mm LLL nodule ---------------------------------------------------------------------------------------------------------------- Current Treatment: Kadcyla q 3 weeks, discontinued 11/11/2020 because of peripheral neuropathy Echocardiogram 06/29/2020: EF 65 to 70% we will obtain her next echocardiogram in a month. Adj XRT will start 09/18/2020   Chemo induced peripheral neuropathy: Currently on gabapentin with Cymbalta and PEA  No significant improvement in the symptoms. We will increase the gabapentin from 300 3 times daily to 600 3 times daily gradually If necessary we can increase the Cymbalta dose as well. She runs out of gabapentin she will call us for refill.  Physical therapy on 03/03/21 Return to clinic in 6 months for follow-up.   I discussed the assessment and treatment plan with the patient. The patient was provided an opportunity to ask questions and all were answered. The patient agreed with the plan and demonstrated an understanding of the instructions. The patient was advised to call back or seek an in-person evaluation if the symptoms worsen or if the condition fails to improve as anticipated.   Total time spent: 30 minutes including face-to-face MyChart video visit time and time spent for planning, charting and coordination of care  Rulon Eisenmenger, MD 02/16/2021  I, Thana Ates am acting as scribe for [name], MD.  I have reviewed the above documentation for accuracy and completeness, and I agree with the above.

## 2021-02-15 NOTE — Therapy (Signed)
[facility] @ [facility] [place] [place], [place], [address] Phone: [phone]   Fax:  [phone]  Physical Therapy Treatment  Patient Details  Name: Sabrina Woods MRN: [id number] Date of Birth: 05/06/1955 Referring Provider (PT): Lindi Adie   Encounter Date: 02/15/2021   PT End of Session - 02/15/21 1549     Visit Number 10   # unchanged due to screen only   PT Start Time 1545    PT Stop Time 1551    PT Time Calculation (min) 6 min    Activity Tolerance Patient tolerated treatment well    Behavior During Therapy [facility] for tasks assessed/performed             Past Medical History:  Diagnosis Date   Acid reflux    Anxiety    Breast cancer ([place])    Breast cancer ([place]) 2022   Depression    HISTORY OF   Fibroid    PONV (postoperative nausea and vomiting)    Smoker     Past Surgical History:  Procedure Laterality Date   CESAREAN SECTION     X 2   MASTECTOMY WITH AXILLARY LYMPH NODE DISSECTION Right 08/04/2020   Procedure: RIGHT MASTECTOMY WITH RIGHT AXILLARY LYMPH NODE DISSECTION;  Surgeon: Rolm Bookbinder, MD;  Location: [facility];  Service: General;  Laterality: Right;   PORTACATH PLACEMENT Left 03/17/2020   Procedure: INSERTION PORT-A-CATH;  Surgeon: Rolm Bookbinder, MD;  Location: [facility];  Service: General;  Laterality: Left;    Vitals:   02/15/21 1548  Weight: 172 lb 2 oz (78.1 kg)     Subjective Assessment - 02/15/21 1548     Subjective Pt returns for her 3 month L-Dex screen. 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  She was seen by her gynecologist who ordered Mammogram/ [place].  Cancer is located in the upper-outer quadrant of the right breast and is ER+.  She is presently having neoadjuvant chemo, and will have radiation following surgery and anti-estrogen.                    L-DEX  FLOWSHEETS - 02/15/21 1500       L-DEX LYMPHEDEMA SCREENING   Measurement Type Unilateral    L-DEX MEASUREMENT EXTREMITY Upper Extremity    POSITION  Standing    DOMINANT SIDE Right    At Risk Side Right    BASELINE SCORE (UNILATERAL) -3.5    L-DEX SCORE (UNILATERAL) -1.1    VALUE CHANGE (UNILAT) 2.4                                     PT Long Term Goals - 10/06/20 0910       PT LONG TERM GOAL #1   Title Pts post surgical shoulder ROM and function will be equal to pre-surgical ROM    Time 22    Period Weeks    Status Achieved      PT LONG TERM GOAL #2   Title Pt will demonstrate 150 degrees of R shoulder abduction with no end range tightness across R pec to allow pt to return to PLOF    Baseline 145; 10/06/20- 170    Time 4    Period Weeks    Status Achieved  PT LONG TERM GOAL #3   Title Pt will demonstrate 90 degrees of R shoulder external rotation to allow her to return to PLOF.    Baseline 77; 10/06/20- 80    Time 4    Period Weeks    Status On-going      PT LONG TERM GOAL #4   Title Pt will be independent in a home exercise program for long term stretching and strengthening    Time 6    Period Weeks    Status On-going      PT LONG TERM GOAL #5   Title Pt will be able to verbalize lymphedema risk reduction practices    Baseline 10/06/20- pt educated in this today    Time 6    Period Weeks    Status Achieved                   Plan - 02/15/21 1551     Clinical Impression Statement Pt returns for her 3 month L-Dex screen. Her change from baseline of 2.4 is WNLs so no further treatment is required at this time except to cont every 3 month L-Dex screens which pt is agreeable to.    PT Next Visit Plan Cont every 3 month L-Dex screen for up to 2 years from her SLNB (~08/05/2022)    Consulted and Agree with Plan of Care Patient             Patient will benefit from skilled therapeutic intervention in order to improve the  following deficits and impairments:     Visit Diagnosis: Aftercare following surgery for neoplasm     Problem List Patient Active Problem List   Diagnosis Date Noted   S/P mastectomy, right 08/04/2020   Chemotherapy-induced peripheral neuropathy ([place]) 05/20/2020   Port-A-Cath in place 03/19/2020   Malignant neoplasm of upper-outer quadrant of right breast in female, estrogen receptor positive ([place]) 03/10/2020   Fibroid    Smoker    Depression     Otelia Limes, PTA 02/15/2021, 3:53 PM  [facility] @ [place] [place], [place], [address] Phone: [phone]   Fax:  [phone]  Name: Sabrina Woods MRN: [id number] Date of Birth: [birth year]

## 2021-02-16 ENCOUNTER — Inpatient Hospital Stay (HOSPITAL_BASED_OUTPATIENT_CLINIC_OR_DEPARTMENT_OTHER): Payer: Managed Care, Other (non HMO) | Admitting: Hematology and Oncology

## 2021-02-16 DIAGNOSIS — C50411 Malignant neoplasm of upper-outer quadrant of right female breast: Secondary | ICD-10-CM | POA: Diagnosis not present

## 2021-02-16 DIAGNOSIS — Z170 Estrogen receptor positive status [ER+]: Secondary | ICD-10-CM

## 2021-02-16 NOTE — Assessment & Plan Note (Signed)
08/04/20:Right mastectomy (Dr. 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 Neoadjuvant chemotherapy with TCH Perjeta 6 cycles followed by Kadcyla maintenance for 1 year 2. Followed bymastectomywith sentinel lymph node study8/2/22 3. Followed by adjuvant radiation therapy completed 11/04/2020 4.Decided not to do antiestrogen therapy because her ER was 0% on final path. 5.Consideration for neratinib ----------------------------------------------------------------------------------------------------------------- 03/15/20: Bone Scan: Subtle uptake in ribs: Non diagnostic 03/16/20: CT CAP: Large Rt breast mass with skin thickening, small sat lesion 1.8 cm, multiple enlarged LN, 4 mm LLL nodule ---------------------------------------------------------------------------------------------------------------- Current Treatment: Kadcyla q 3 weeks, discontinued 11/11/2020 because of peripheral neuropathy Echocardiogram 06/29/2020: EF 65 to 70% we will obtain her next echocardiogram in a month. 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

## 2021-02-21 ENCOUNTER — Other Ambulatory Visit: Payer: Self-pay | Admitting: Hematology and Oncology

## 2021-02-22 ENCOUNTER — Telehealth: Payer: Self-pay

## 2021-02-22 NOTE — Telephone Encounter (Signed)
Advised pt her insurance would not pay for her scans at [facility], but approved for GI. Advised pt to call and schedule scans. Pt verbalized thanks and understanding.

## 2021-02-24 ENCOUNTER — Ambulatory Visit (HOSPITAL_COMMUNITY): Payer: Managed Care, Other (non HMO)

## 2021-02-25 ENCOUNTER — Inpatient Hospital Stay: Payer: Managed Care, Other (non HMO)

## 2021-03-03 ENCOUNTER — Ambulatory Visit: Payer: Managed Care, Other (non HMO) | Attending: Adult Health | Admitting: Physical Therapy

## 2021-03-03 ENCOUNTER — Encounter: Payer: Self-pay | Admitting: Physical Therapy

## 2021-03-03 ENCOUNTER — Other Ambulatory Visit: Payer: Self-pay

## 2021-03-03 DIAGNOSIS — T451X5A Adverse effect of antineoplastic and immunosuppressive drugs, initial encounter: Secondary | ICD-10-CM | POA: Diagnosis not present

## 2021-03-03 DIAGNOSIS — G620 Drug-induced polyneuropathy: Secondary | ICD-10-CM | POA: Diagnosis not present

## 2021-03-03 DIAGNOSIS — M6281 Muscle weakness (generalized): Secondary | ICD-10-CM | POA: Diagnosis present

## 2021-03-03 DIAGNOSIS — C50411 Malignant neoplasm of upper-outer quadrant of right female breast: Secondary | ICD-10-CM | POA: Insufficient documentation

## 2021-03-03 DIAGNOSIS — R293 Abnormal posture: Secondary | ICD-10-CM | POA: Diagnosis present

## 2021-03-03 DIAGNOSIS — Z170 Estrogen receptor positive status [ER+]: Secondary | ICD-10-CM | POA: Insufficient documentation

## 2021-03-03 DIAGNOSIS — R262 Difficulty in walking, not elsewhere classified: Secondary | ICD-10-CM | POA: Insufficient documentation

## 2021-03-03 DIAGNOSIS — R208 Other disturbances of skin sensation: Secondary | ICD-10-CM | POA: Diagnosis present

## 2021-03-03 NOTE — Therapy (Signed)
OUTPATIENT PHYSICAL THERAPY ONCOLOGY EVALUATION  Patient Name: Sabrina Woods MRN: [id number] DOB:12/18/1955, 66 y.o., female Today's Date: 03/03/2021   PT End of Session - 03/03/21 1718     Visit Number 1    Number of Visits 13    Date for PT Re-Evaluation 04/14/21    PT Start Time 1607    PT Stop Time 0017    PT Time Calculation (min) 43 min    Activity Tolerance Patient tolerated treatment well    Behavior During Therapy [facility] for tasks assessed/performed             Past Medical History:  Diagnosis Date   Acid reflux    Anxiety    Breast cancer ([place])    Breast cancer ([place]) 2022   Depression    HISTORY OF   Fibroid    PONV (postoperative nausea and vomiting)    Smoker    Past Surgical History:  Procedure Laterality Date   CESAREAN SECTION     X 2   MASTECTOMY WITH AXILLARY LYMPH NODE DISSECTION Right 08/04/2020   Procedure: RIGHT MASTECTOMY WITH RIGHT AXILLARY LYMPH NODE DISSECTION;  Surgeon: Rolm Bookbinder, MD;  Location: [place];  Service: General;  Laterality: Right;   PORTACATH PLACEMENT Left 03/17/2020   Procedure: INSERTION PORT-A-CATH;  Surgeon: Rolm Bookbinder, MD;  Location: [place];  Service: General;  Laterality: Left;   Patient Active Problem List   Diagnosis Date Noted   S/P mastectomy, right 08/04/2020   Chemotherapy-induced peripheral neuropathy ([place]) 05/20/2020   Port-A-Cath in place 03/19/2020   Malignant neoplasm of upper-outer quadrant of right breast in female, estrogen receptor positive ([place]) 03/10/2020   Fibroid    Smoker    Depression     PCP: [name], MD  REFERRING PROVIDER: Wilber Bihari Cornett*  REFERRING DIAG: G62.0,T45.1X5A (ICD-10-CM) - Chemotherapy-induced peripheral neuropathy (HCC)   THERAPY DIAG:  Muscle weakness (generalized)  Difficulty in walking, not elsewhere classified  Other disturbances of skin sensation  Abnormal posture  Malignant neoplasm of upper-outer quadrant of right  breast in female, estrogen receptor positive ([place])  ONSET DATE: 08/04/20  SUBJECTIVE                                                                                                                                                                                           SUBJECTIVE STATEMENT: I have been dealing with neuropathy in my hands and feet for two months. I have trouble opening anything. Walking is very slow.   PERTINENT HISTORY: R breast cancer s/p R mastectomy on 08/04/20 with 2 R positive axillary nodes, high grade DCIS. Has  completed chemo as of Oct 2022 and radiation 11/04/20  PAIN:  Are you having pain? Yes NPRS scale: 5/10 Pain location: hands Pain orientation: Bilateral  PAIN TYPE: burning and tingling Pain description: intermittent  Aggravating factors: lying flat, extending wrist Relieving factors: being in the recliner  PRECAUTIONS: Other: at risk for lymphedema on the R side  WEIGHT BEARING RESTRICTIONS No  FALLS:  Has patient fallen in last 6 months? No,   LIVING ENVIRONMENT: Lives with: lives with their spouse Lives in: House/apartment Stairs: No; External: 4 steps; on left going up - uses side of house to hold on to because she is unable to grip on the L Has following equipment at home: Single point cane Pt uses this to go to the bathroom in the night since developing CIPN OCCUPATION: Pt is on medical leave, does computer work  LEISURE: pt is not currently exercising  HAND DOMINANCE : right   PRIOR LEVEL OF FUNCTION: Independent  PATIENT GOALS to try to help ease the pain from CIPN, to become more active   OBJECTIVE  COGNITION:  Overall cognitive status: Within functional limits for tasks assessed    OBSERVATIONS / OTHER ASSESSMENTS: pt sits with palms up to help decrease pain  SENSATION:  Light touch: Deficits barely able to detect and sometimes difficulty localizling area that is touched POSTURE: forward head, rounded shoulders  LE MMT:  R  hip flexion: 4/5 L hip flexion: 3+/5 R hip abduction: 3/5 L hip abduction: 3/5 R hip extension: 2+/5 L hip extension: 2+/5 R knee extension: 5/5 L knee extension: 4/5 R knee flexion: 3/5 L knee flexion: 3/5 R ankle DF: 3/5 L ankle DF: 3-/5  GRIP STRENGTH:  R: 15 lbs avg for her age is 49.6 lbs L: 9 lbs avg for her age is 42 lbs  LYMPHEDEMA ASSESSMENTS:   SURGERY TYPE/DATE: 08/04/20 - R mastectomy  NUMBER OF LYMPH NODES REMOVED: 2 nodes positive  CHEMOTHERAPY: Completed in 10 2022  RADIATION:completed 11/2020   FUNCTIONAL TESTS:  30 seconds chair stand test; 6 reps which is poor for her age  GAIT: Distance walked: 20 feet Assistive device utilized: None Level of assistance: Modified independence Comments: narrow base of support, adduction, foot flat, increased kyphosis, shortened step length    QUICK DASH SURVEY:   Katina Dung - 03/03/21 0001     Open a tight or new jar Severe difficulty    Do heavy household chores (wash walls, wash floors) Severe difficulty    Carry a shopping bag or briefcase Moderate difficulty    Wash your back Moderate difficulty    Use a knife to cut food Moderate difficulty    Recreational activities in which you take some force or impact through your arm, shoulder, or hand (golf, hammering, tennis) Moderate difficulty    During the past week, to what extent has your arm, shoulder or hand problem interfered with your normal social activities with family, friends, neighbors, or groups? Modererately    During the past week, to what extent has your arm, shoulder or hand problem limited your work or other regular daily activities Quite a bit    Arm, shoulder, or hand pain. Severe    Tingling (pins and needles) in your arm, shoulder, or hand Severe    Difficulty Sleeping Mild difficulty    DASH Score 59.09 %             Quick Dash - 03/03/21 0001     Open a tight or new  jar Severe difficulty    Do heavy household chores (wash walls, wash  floors) Severe difficulty    Carry a shopping bag or briefcase Moderate difficulty    Wash your back Moderate difficulty    Use a knife to cut food Moderate difficulty    Recreational activities in which you take some force or impact through your arm, shoulder, or hand (golf, hammering, tennis) Moderate difficulty    During the past week, to what extent has your arm, shoulder or hand problem interfered with your normal social activities with family, friends, neighbors, or groups? Modererately    During the past week, to what extent has your arm, shoulder or hand problem limited your work or other regular daily activities Quite a bit    Arm, shoulder, or hand pain. Severe    Tingling (pins and needles) in your arm, shoulder, or hand Severe    Difficulty Sleeping Mild difficulty    DASH Score 59.09 %                TODAY'S TREATMENT  No treatment performed today due to time contraints  PATIENT EDUCATION:  Education details: article: Managing Neuropathy After Chemotherapy in Patients With Cancer, using e-stim, heat/cold, massage, exercise can help but exercise seems to have longer lasting effects Person educated: Patient and Spouse Education method: Explanation and Handouts Education comprehension: verbalized understanding   HOME EXERCISE PROGRAM: Purchase hand strengthening kit and begin using this  ASSESSMENT:  CLINICAL IMPRESSION: Pt reports to PT with chemo induced peripheral neuropathy. She has decreased sensation in bilateral feet. She is unsteady with gait and ambulates with a narrow base of support, foot flat and shortened step length. She demonstrates increased weakness of bilateral hamstrings, ankle dorsiflexors, glutes and hip abductors. She also has decreased bilateral grip strength and her 30 sec sit to stand test was poor for her age. She has been mostly sedentary since developing CIPN due to pain in her feet and hands. She would benefit from skilled PT services to  improve bilateral LE strength, decrease pain from CIPN, improve gait and balance.   OBJECTIVE IMPAIRMENTS Abnormal gait, decreased activity tolerance, decreased balance, decreased endurance, decreased knowledge of condition, decreased mobility, difficulty walking, decreased strength, impaired sensation, postural dysfunction, and pain.   ACTIVITY LIMITATIONS cleaning, community activity, occupation, and shopping.   PERSONAL FACTORS Fitness and Time since onset of injury/illness/exacerbation are also affecting patient's functional outcome.    REHAB POTENTIAL: Good  CLINICAL DECISION MAKING: Evolving/moderate complexity  EVALUATION COMPLEXITY: Moderate   GOALS: Goals reviewed with patient? Yes  SHORT TERM GOALS:  STG Name Target Date Goal status  1 Pt will demonstrate 3+/5 bilateral hip abductor strength to decrease risk of falls. Baseline: 3/5 03/24/2021 INITIAL  2 Pt will be able to complete 8 sit to stands in 30 seconds to improve functional mobility.  Baseline: 6 reps 03/24/2021 INITIAL  3 Pt will improve bilateral grip strength to at least 20 lbs to allow pt to open objects.  Baseline: R 15lbs, L 9 lbs 03/24/2021 INITIAL  4 Pt will be independent in a home exercise program for long term stretching and strengthening. Baseline: 03/24/2021 INITIAL   LONG TERM GOALS:   LTG Name Target Date Goal status  1 Pt will demonstrate 4/5 bilateral hip abductor strength to decrease risk of falls. Baseline: 3/5 04/14/2021 INITIAL  2 Pt will be able to complete 12 sit to stands in 30 seconds to improve functional mobility.  Baseline: 6 reps 04/14/2021 INITIAL  3 Pt will demonstrate bilateral hip extensor strength of 3+/5 to decrease fall risk.  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  04/14/2021 INITIAL  5 Pt will be able to ambulate with normal base of support and upright posture to decrease fall risk. Baseline:  increased rounded shoulders, thoracic kyphosis, narrow base of support 04/14/2021 INITIAL   PLAN: PT FREQUENCY: 2x/week  PT DURATION: 6 weeks  PLANNED INTERVENTIONS: Therapeutic exercises, Therapeutic activity, Neuromuscular re-education, Balance training, Gait training, Patient/Family education, Joint mobilization, Electrical stimulation, Moist heat, and Manual therapy  PLAN FOR NEXT SESSION: begin LE strengthening exercises, NuStep, ankle strengthening for CIPN, did pt order grip strengthening kit?   Allyson Sabal Buckner, PT 03/03/2021, 5:19 PM

## 2021-03-08 ENCOUNTER — Other Ambulatory Visit: Payer: Managed Care, Other (non HMO)

## 2021-03-08 ENCOUNTER — Encounter: Payer: Self-pay | Admitting: *Deleted

## 2021-03-08 ENCOUNTER — Other Ambulatory Visit: Payer: Self-pay | Admitting: *Deleted

## 2021-03-08 MED ORDER — DULOXETINE HCL 30 MG PO CPEP: 30.0000 mg | ORAL_CAPSULE | Freq: Every day | ORAL | 3 refills | Status: DC

## 2021-03-08 MED ORDER — GABAPENTIN 600 MG PO TABS: 600.0000 mg | ORAL_TABLET | Freq: Three times a day (TID) | ORAL | 1 refills | Status: DC

## 2021-03-08 NOTE — Telephone Encounter (Signed)
Received call from pt with complaint of ongoing neuropathy and requesting refill for Gabapentin 600 mg p.o TID as well as a possible increase in Cymbalta to help alleviate symptoms.  Per MD pt to increase Cymbalta to 30 mg p.o daily.  Prescription sent to pharmacy on file.  Pt educated and verbalized understanding.  ?

## 2021-03-10 ENCOUNTER — Telehealth: Payer: Self-pay | Admitting: Hematology and Oncology

## 2021-03-10 NOTE — Telephone Encounter (Signed)
Scheduled appointment per inbasket message. Patient is aware.  ?

## 2021-03-11 ENCOUNTER — Ambulatory Visit: Payer: Managed Care, Other (non HMO)

## 2021-03-11 ENCOUNTER — Other Ambulatory Visit: Payer: Self-pay

## 2021-03-11 DIAGNOSIS — M6281 Muscle weakness (generalized): Secondary | ICD-10-CM | POA: Diagnosis not present

## 2021-03-11 DIAGNOSIS — R208 Other disturbances of skin sensation: Secondary | ICD-10-CM

## 2021-03-11 DIAGNOSIS — R293 Abnormal posture: Secondary | ICD-10-CM

## 2021-03-11 DIAGNOSIS — C50411 Malignant neoplasm of upper-outer quadrant of right female breast: Secondary | ICD-10-CM

## 2021-03-11 DIAGNOSIS — R262 Difficulty in walking, not elsewhere classified: Secondary | ICD-10-CM

## 2021-03-11 NOTE — Therapy (Signed)
OUTPATIENT PHYSICAL THERAPY TREATMENT NOTE   Patient Name: Sabrina Woods MRN: [id number] DOB:[birth year], 66 y.o., female Today's Date: 03/11/2021  PCP: [name], MD REFERRING PROVIDER: Wilber Bihari Cornett*   PT End of Session - 03/11/21 1504     Visit Number 2    Number of Visits 13    Date for PT Re-Evaluation 04/14/21    PT Start Time 8768    PT Stop Time 1550    PT Time Calculation (min) 44 min    Activity Tolerance Patient tolerated treatment well    Behavior During Therapy [facility] for tasks assessed/performed             Past Medical History:  Diagnosis Date   Acid reflux    Anxiety    Breast cancer ([place])    Breast cancer ([place]) 2022   Depression    HISTORY OF   Fibroid    PONV (postoperative nausea and vomiting)    Smoker    Past Surgical History:  Procedure Laterality Date   CESAREAN SECTION     X 2   MASTECTOMY WITH AXILLARY LYMPH NODE DISSECTION Right 08/04/2020   Procedure: RIGHT MASTECTOMY WITH RIGHT AXILLARY LYMPH NODE DISSECTION;  Surgeon: Rolm Bookbinder, MD;  Location: [place];  Service: General;  Laterality: Right;   PORTACATH PLACEMENT Left 03/17/2020   Procedure: INSERTION PORT-A-CATH;  Surgeon: Rolm Bookbinder, MD;  Location: [place];  Service: General;  Laterality: Left;   Patient Active Problem List   Diagnosis Date Noted   S/P mastectomy, right 08/04/2020   Chemotherapy-induced peripheral neuropathy ([place]) 05/20/2020   Port-A-Cath in place 03/19/2020   Malignant neoplasm of upper-outer quadrant of right breast in female, estrogen receptor positive ([place]) 03/10/2020   Fibroid    Smoker    Depression     REFERRING DIAG:  G62.0,T45.1X5A (ICD-10-CM) - Chemotherapy-induced peripheral neuropathy (HCC)   THERAPY DIAG:  Muscle weakness (generalized)  Difficulty in walking, not elsewhere classified  Other disturbances of skin sensation  Abnormal posture  Malignant neoplasm of upper-outer quadrant of right  breast in female, estrogen receptor positive ([place])  PERTINENT HISTORY: PERTINENT HISTORY: R breast cancer s/p R mastectomy on 08/04/20 with 2 R positive axillary nodes, high grade DCIS. Has completed chemo as of Oct 2022 and radiation 11/04/20  PRECAUTIONS: at risk for lymphedema on the R side  SUBJECTIVE: No problems after last visit.  Got a handgrip kit from [employer] and I have been using it.  I can only grip the left hand with my first 3 fingers. They increased my gabapentin and Cymbalta to help with pain and numbness  PAIN:  Are you having pain? Yes NPRS scale: 7/10 Pain location: Left hand, and big toes,both calves Pain orientation: Bilateral, Distal, and Upper  PAIN TYPE: burning, sharp, and cold Pain description: sharp and burning  Aggravating factors: cold, washing hands Relieving factors: wrapping in a blanket, using heating pad  03/03/2021 COGNITION:            Overall cognitive status: Within functional limits for tasks assessed      OBSERVATIONS / OTHER ASSESSMENTS: pt sits with palms up to help decrease pain   SENSATION:            Light touch: Deficits barely able to detect and sometimes difficulty localizling area that is touched POSTURE: forward head, rounded shoulders   LE MMT:  R hip flexion: 4/5 L hip flexion: 3+/5 R hip abduction: 3/5 L hip abduction: 3/5 R hip extension:  2+/5 L hip extension: 2+/5 R knee extension: 5/5 L knee extension: 4/5 R knee flexion: 3/5 L knee flexion: 3/5 R ankle DF: 3/5 L ankle DF: 3-/5   GRIP STRENGTH:   R: 15 lbs avg for her age is 49.6 lbs L: 9 lbs avg for her age is 58 lbs   LYMPHEDEMA ASSESSMENTS:    SURGERY TYPE/DATE: 08/04/20 - R mastectomy   NUMBER OF LYMPH NODES REMOVED: 2 nodes positive   CHEMOTHERAPY: Completed in 10 2022   RADIATION:completed 11/2020     FUNCTIONAL TESTS:  30 seconds chair stand test; 6 reps which is poor for her age   GAIT: Distance walked: 20 feet Assistive device utilized: None Level  of assistance: Modified independence Comments: narrow base of support, adduction, foot flat, increased kyphosis, shortened step length       QUICK DASH SURVEY:    Katina Dung - 03/03/21 0001       Open a tight or new jar Severe difficulty     Do heavy household chores (wash walls, wash floors) Severe difficulty     Carry a shopping bag or briefcase Moderate difficulty     Wash your back Moderate difficulty     Use a knife to cut food Moderate difficulty     Recreational activities in which you take some force or impact through your arm, shoulder, or hand (golf, hammering, tennis) Moderate difficulty     During the past week, to what extent has your arm, shoulder or hand problem interfered with your normal social activities with family, friends, neighbors, or groups? Modererately     During the past week, to what extent has your arm, shoulder or hand problem limited your work or other regular daily activities Quite a bit     Arm, shoulder, or hand pain. Severe     Tingling (pins and needles) in your arm, shoulder, or hand Severe     Difficulty Sleeping Mild difficulty     DASH Score 59.09 %                   Quick Dash - 03/03/21 0001       Open a tight or new jar Severe difficulty     Do heavy household chores (wash walls, wash floors) Severe difficulty     Carry a shopping bag or briefcase Moderate difficulty     Wash your back Moderate difficulty     Use a knife to cut food Moderate difficulty     Recreational activities in which you take some force or impact through your arm, shoulder, or hand (golf, hammering, tennis) Moderate difficulty     During the past week, to what extent has your arm, shoulder or hand problem interfered with your normal social activities with family, friends, neighbors, or groups? Modererately     During the past week, to what extent has your arm, shoulder or hand problem limited your work or other regular daily activities Quite a bit     Arm, shoulder,  or hand pain. Severe     Tingling (pins and needles) in your arm, shoulder, or hand Severe     Difficulty Sleeping Mild difficulty     DASH Score 59.09 %                         03/11/2021 TODAY'S TREATMENT  Nu step 4 min LE seat 7, UE 8 lev 5 for 4 min Heel raises 2x10, marching  2 times 10 holding parallel bars, 1 x 10 no hand hold but CGA of PT, bilateral hip abd x 15, Step ups on airex x 10 with CGA of PT,  DLS on ax eyes open 2 x 30 feet together, DLS on ax feet together eyes closed 3 x 20 sec with CGA of PT, LAQ 2# ea LAQ 3 x 5 ea, bilateral hip flexion 2# sitting 2x5, DF stretch on incline 3 x 20 sec, 5 in step ups x 10 bilaterally, sitting clam with red theraband x 10   03/03/2021 No treatment performed today due to time contraints   PATIENT EDUCATION:  Education details: exs; LAQ, sitting march, standing march, standing hip abduction, sitting clam with red TB Person educated:  Education method: Consulting civil engineer , demonstration, and Handouts Education comprehension: demonstrated understandintunderstanding     HOME EXERCISE PROGRAM: Purchase hand strengthening kit and begin using this,[id number]   ASSESSMENT:   CLINICAL IMPRESSION: Pt performed sitting,and standing strength and flexibility exs and standing balance exercises.  Left leg is visibly weaker causing hip hike with standing abd, and with less stability with LAQ and reduced ROM with clam shell. All exs were done with gait belt and CGA if pt was not holding bars in standing. There were several LOB but pt was able to self correct      OBJECTIVE IMPAIRMENTS Abnormal gait, decreased activity tolerance, decreased balance, decreased endurance, decreased knowledge of condition, decreased mobility, difficulty walking, decreased strength, impaired sensation, postural dysfunction, and pain.    ACTIVITY LIMITATIONS cleaning, community activity, occupation, and shopping.    PERSONAL FACTORS Fitness and Time since onset of  injury/illness/exacerbation are also affecting patient's functional outcome.      REHAB POTENTIAL: Good   CLINICAL DECISION MAKING: Evolving/moderate complexity   EVALUATION COMPLEXITY: Moderate     GOALS: Goals reviewed with patient? Yes   SHORT TERM GOALS:   STG Name Target Date Goal status  1 Pt will demonstrate 3+/5 bilateral hip abductor strength to decrease risk of falls. Baseline: 3/5 03/24/2021 INITIAL  2 Pt will be able to complete 8 sit to stands in 30 seconds to improve functional mobility.  Baseline: 6 reps 03/24/2021 INITIAL  3 Pt will improve bilateral grip strength to at least 20 lbs to allow pt to open objects.  Baseline: R 15lbs, L 9 lbs 03/24/2021 INITIAL  4 Pt will be independent in a home exercise program for long term stretching and strengthening. Baseline: 03/24/2021 INITIAL    LONG TERM GOALS:    LTG Name Target Date Goal status  1 Pt will demonstrate 4/5 bilateral hip abductor strength to decrease risk of falls. Baseline: 3/5 04/14/2021 INITIAL  2 Pt will be able to complete 12 sit to stands in 30 seconds to improve functional mobility.  Baseline: 6 reps 04/14/2021 INITIAL  3 Pt will demonstrate bilateral hip extensor strength of 3+/5 to decrease fall risk.  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  04/14/2021 INITIAL  5 Pt will be able to ambulate with normal base of support and upright posture to decrease fall risk. Baseline: increased rounded shoulders, thoracic kyphosis, narrow base of support 04/14/2021 INITIAL    PLAN: PT FREQUENCY: 2x/week   PT DURATION: 6 weeks   PLANNED INTERVENTIONS: Therapeutic exercises, Therapeutic activity, Neuromuscular re-education, Balance training, Gait training, Patient/Family education, Joint mobilization, Electrical stimulation, Moist heat, and Manual therapy   PLAN FOR NEXT SESSION: begin LE strengthening exercises,  NuStep, ankle strengthening  for CIPN,          Claris Pong, PT 03/11/2021, 4:00 PM

## 2021-03-15 ENCOUNTER — Other Ambulatory Visit: Payer: Self-pay

## 2021-03-15 ENCOUNTER — Ambulatory Visit: Payer: Managed Care, Other (non HMO)

## 2021-03-15 DIAGNOSIS — M6281 Muscle weakness (generalized): Secondary | ICD-10-CM

## 2021-03-15 DIAGNOSIS — R208 Other disturbances of skin sensation: Secondary | ICD-10-CM

## 2021-03-15 DIAGNOSIS — R262 Difficulty in walking, not elsewhere classified: Secondary | ICD-10-CM

## 2021-03-15 DIAGNOSIS — R293 Abnormal posture: Secondary | ICD-10-CM

## 2021-03-15 NOTE — Therapy (Signed)
OUTPATIENT PHYSICAL THERAPY TREATMENT NOTE   Patient Name: Sabrina Woods MRN: [id number] DOB:01-03-1956, 66 y.o., female Today's Date: 03/15/2021  PCP: [name], MD REFERRING PROVIDER: Wilber Bihari [place] - 03/15/21 1509     Visit Number 3    Number of Visits 13    Date for PT Re-Evaluation 04/14/21    PT Start Time 4235    PT Stop Time 3614    PT Time Calculation (min) 49 min    Activity Tolerance Patient tolerated treatment well    Behavior During Therapy [facility] for tasks assessed/performed             Past Medical History:  Diagnosis Date   Acid reflux    Anxiety    Breast cancer ([place])    Breast cancer ([place]) 2022   Depression    HISTORY OF   Fibroid    PONV (postoperative nausea and vomiting)    Smoker    Past Surgical History:  Procedure Laterality Date   CESAREAN SECTION     X 2   MASTECTOMY WITH AXILLARY LYMPH NODE DISSECTION Right 08/04/2020   Procedure: RIGHT MASTECTOMY WITH RIGHT AXILLARY LYMPH NODE DISSECTION;  Surgeon: Rolm Bookbinder, MD;  Location: [facility];  Service: General;  Laterality: Right;   PORTACATH PLACEMENT Left 03/17/2020   Procedure: INSERTION PORT-A-CATH;  Surgeon: Rolm Bookbinder, MD;  Location: [place];  Service: General;  Laterality: Left;   Patient Active Problem List   Diagnosis Date Noted   S/P mastectomy, right 08/04/2020   Chemotherapy-induced peripheral neuropathy ([place]) 05/20/2020   Port-A-Cath in place 03/19/2020   Malignant neoplasm of upper-outer quadrant of right breast in female, estrogen receptor positive ([place]) 03/10/2020   Fibroid    Smoker    Depression     REFERRING DIAG:  G62.0,T45.1X5A (ICD-10-CM) - Chemotherapy-induced peripheral neuropathy (HCC)   THERAPY DIAG:  Muscle weakness (generalized)  Difficulty in walking, not elsewhere classified  Other disturbances of skin sensation  Abnormal posture  PERTINENT HISTORY: PERTINENT HISTORY: R breast cancer  s/p R mastectomy on 08/04/20 with 2 R positive axillary nodes, high grade DCIS. Has completed chemo as of Oct 2022 and radiation 11/04/20  PRECAUTIONS: at risk for lymphedema on the R side  SUBJECTIVE: No problems after last visit. Tired but not too bad after last visit.  Using the putty at home for hands.   PAIN:  Are you having pain? Yes NPRS scale: 5/10 Pain location: Left hand, and big toes,both calves Pain orientation: Bilateral, Distal, and Upper  PAIN TYPE: burning, sharp, and cold Pain description: sharp and burning  Aggravating factors: cold, washing hands Relieving factors: wrapping in a blanket, using heating pad  03/03/2021 COGNITION:            Overall cognitive status: Within functional limits for tasks assessed      OBSERVATIONS / OTHER ASSESSMENTS: pt sits with palms up to help decrease pain   SENSATION:            Light touch: Deficits barely able to detect and sometimes difficulty localizling area that is touched POSTURE: forward head, rounded shoulders   LE MMT:  R hip flexion: 4/5 L hip flexion: 3+/5 R hip abduction: 3/5 L hip abduction: 3/5 R hip extension: 2+/5 L hip extension: 2+/5 R knee extension: 5/5 L knee extension: 4/5 R knee flexion: 3/5 L knee flexion: 3/5 R ankle DF: 3/5 L ankle DF: 3-/5   GRIP STRENGTH:   R:  15 lbs avg for her age is 49.6 lbs L: 9 lbs avg for her age is 44 lbs   LYMPHEDEMA ASSESSMENTS:    SURGERY TYPE/DATE: 08/04/20 - R mastectomy   NUMBER OF LYMPH NODES REMOVED: 2 nodes positive   CHEMOTHERAPY: Completed in 10 2022   RADIATION:completed 11/2020     FUNCTIONAL TESTS:  30 seconds chair stand test; 6 reps which is poor for her age   GAIT: Distance walked: 20 feet Assistive device utilized: None Level of assistance: Modified independence Comments: narrow base of support, adduction, foot flat, increased kyphosis, shortened step length       QUICK DASH SURVEY:    Katina Dung - 03/03/21 0001       Open a tight  or new jar Severe difficulty     Do heavy household chores (wash walls, wash floors) Severe difficulty     Carry a shopping bag or briefcase Moderate difficulty     Wash your back Moderate difficulty     Use a knife to cut food Moderate difficulty     Recreational activities in which you take some force or impact through your arm, shoulder, or hand (golf, hammering, tennis) Moderate difficulty     During the past week, to what extent has your arm, shoulder or hand problem interfered with your normal social activities with family, friends, neighbors, or groups? Modererately     During the past week, to what extent has your arm, shoulder or hand problem limited your work or other regular daily activities Quite a bit     Arm, shoulder, or hand pain. Severe     Tingling (pins and needles) in your arm, shoulder, or hand Severe     Difficulty Sleeping Mild difficulty     DASH Score 59.09 %                   Quick Dash - 03/03/21 0001       Open a tight or new jar Severe difficulty     Do heavy household chores (wash walls, wash floors) Severe difficulty     Carry a shopping bag or briefcase Moderate difficulty     Wash your back Moderate difficulty     Use a knife to cut food Moderate difficulty     Recreational activities in which you take some force or impact through your arm, shoulder, or hand (golf, hammering, tennis) Moderate difficulty     During the past week, to what extent has your arm, shoulder or hand problem interfered with your normal social activities with family, friends, neighbors, or groups? Modererately     During the past week, to what extent has your arm, shoulder or hand problem limited your work or other regular daily activities Quite a bit     Arm, shoulder, or hand pain. Severe     Tingling (pins and needles) in your arm, shoulder, or hand Severe     Difficulty Sleeping Mild difficulty     DASH Score 59.09 %                  03/15/2021 TODAYS treatment Nu step  Seat 7 UE 8 level 5 x 4 min Airex beam x 6 lengths forward and sideways without hands as much as possible, CGA of PT Step ups on ax x 10 bilaterally Marching on airex 2 x 10, heel raises on ax 2 x10 no hands  vector reaches in SLS x 5 ea bilaterally Mini squats x 10  with hand hold Standing hip abd x 10  DF stretch on incline x 3 bilaterally, 20 sec LAQ 2 x5 with 2# bilaterally, sitting hip flexion 2x5 2# Mini squats x 10  seated clam 2 x 10 red theraband Seated toe raises, standing heel raises 03/11/2021 Nu step 4 min LE seat 7, UE 8 lev 5 for 4 min Heel raises 2x10, marching 2 times 10 holding parallel bars, 1 x 10 no hand hold but CGA of PT, bilateral hip abd x 15, Step ups on airex x 10 with CGA of PT,  DLS on ax eyes open 2 x 30 feet together, DLS on ax feet together eyes closed 3 x 20 sec with CGA of PT, LAQ 2# ea LAQ 3 x 5 ea, bilateral hip flexion 2# sitting 2x5, DF stretch on incline 3 x 20 sec, 5 in step ups x 10 bilaterally, sitting clam with red theraband x 10   03/03/2021 No treatment performed today due to time contraints   PATIENT EDUCATION:  Education details: exs;  Person educated:  Education method: Consulting civil engineer , demonstration, and Handouts Education comprehension: demonstrated understandintunderstanding     HOME EXERCISE PROGRAM: Purchase hand strengthening kit and begin using this,[id number], LAQ, seated march, standing march, standing hip abd, sitting clam with red band, seated toe taps, standing heel raises, mmini squats   ASSESSMENT:   CLINICAL IMPRESSION: Pt performed sitting,and standing strength and flexibility exs and standing balance exercises.  Left leg is visibly weaker  All exs were done with gait belt and CGA if pt was not holding bars in standing. Balance exercises were progressed and pt demonstrated good improvement with practice and ability to do more repetitions without hand hold.There were several LOB but pt was able to self correct      OBJECTIVE  IMPAIRMENTS Abnormal gait, decreased activity tolerance, decreased balance, decreased endurance, decreased knowledge of condition, decreased mobility, difficulty walking, decreased strength, impaired sensation, postural dysfunction, and pain.    ACTIVITY LIMITATIONS cleaning, community activity, occupation, and shopping.    PERSONAL FACTORS Fitness and Time since onset of injury/illness/exacerbation are also affecting patient's functional outcome.      REHAB POTENTIAL: Good   CLINICAL DECISION MAKING: Evolving/moderate complexity   EVALUATION COMPLEXITY: Moderate     GOALS: Goals reviewed with patient? Yes   SHORT TERM GOALS:   STG Name Target Date Goal status  1 Pt will demonstrate 3+/5 bilateral hip abductor strength to decrease risk of falls. Baseline: 3/5 03/24/2021 INITIAL  2 Pt will be able to complete 8 sit to stands in 30 seconds to improve functional mobility.  Baseline: 6 reps 03/24/2021 INITIAL  3 Pt will improve bilateral grip strength to at least 20 lbs to allow pt to open objects.  Baseline: R 15lbs, L 9 lbs 03/24/2021 INITIAL  4 Pt will be independent in a home exercise program for long term stretching and strengthening. Baseline: 03/24/2021 INITIAL    LONG TERM GOALS:    LTG Name Target Date Goal status  1 Pt will demonstrate 4/5 bilateral hip abductor strength to decrease risk of falls. Baseline: 3/5 04/14/2021 INITIAL  2 Pt will be able to complete 12 sit to stands in 30 seconds to improve functional mobility.  Baseline: 6 reps 04/14/2021 INITIAL  3 Pt will demonstrate bilateral hip extensor strength of 3+/5 to decrease fall risk.  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  04/14/2021 INITIAL  5 Pt  will be able to ambulate with normal base of support and upright posture to decrease fall risk. Baseline: increased rounded shoulders, thoracic kyphosis, narrow base of support 04/14/2021 INITIAL     PLAN: PT FREQUENCY: 2x/week   PT DURATION: 6 weeks   PLANNED INTERVENTIONS: Therapeutic exercises, Therapeutic activity, Neuromuscular re-education, Balance training, Gait training, Patient/Family education, Joint mobilization, Electrical stimulation, Moist heat, and Manual therapy   PLAN FOR NEXT SESSION: begin LE strengthening exercises, balance, NuStep, ankle strengthening for CIPN,          Claris Pong, PT 03/15/2021, 3:57 PM

## 2021-03-15 NOTE — Patient Instructions (Signed)
Access Code: [email or web address] ?URL: [email or web address] ?Date: 03/15/2021 ?Prepared by: Cheral Almas ? ?Exercises ?Mini Squat with Counter Support - 1 x daily - 7 x weekly - 1 sets - 10 reps ?Seated Toe Raise - 1 x daily - 7 x weekly - 2 sets - 10 reps ?Heel Raise - 1 x daily - 7 x weekly - 2 sets - 10 reps ? ?

## 2021-03-17 ENCOUNTER — Other Ambulatory Visit: Payer: Self-pay

## 2021-03-17 ENCOUNTER — Ambulatory Visit: Payer: Managed Care, Other (non HMO)

## 2021-03-17 DIAGNOSIS — M6281 Muscle weakness (generalized): Secondary | ICD-10-CM

## 2021-03-17 DIAGNOSIS — R293 Abnormal posture: Secondary | ICD-10-CM

## 2021-03-17 DIAGNOSIS — R262 Difficulty in walking, not elsewhere classified: Secondary | ICD-10-CM

## 2021-03-17 DIAGNOSIS — R208 Other disturbances of skin sensation: Secondary | ICD-10-CM

## 2021-03-17 NOTE — Therapy (Signed)
?OUTPATIENT PHYSICAL THERAPY TREATMENT NOTE ? ? ?Patient Name: Sabrina Woods ?MRN: [id number] ?DOB:06-Feb-1955, 66 y.o., female ?Today's Date: 03/17/2021 ? ?PCP: [name], MD ?REFERRING PROVIDER: [name], MD ? ? PT End of Session - 03/17/21 1607   ? ? Visit Number 4   ? Number of Visits 13   ? Date for PT Re-Evaluation 04/14/21   ? PT Start Time 2952   ? PT Stop Time 8413   ? PT Time Calculation (min) 49 min   ? Equipment Utilized During Treatment Gait belt   ? Activity Tolerance Patient tolerated treatment well   ? Behavior During Therapy [facility] for tasks assessed/performed   ? ?  ?  ? ?  ? ? ?Past Medical History:  ?Diagnosis Date  ? Acid reflux   ? Anxiety   ? Breast cancer ([place])   ? Breast cancer ([place]) 2022  ? Depression   ? HISTORY OF  ? Fibroid   ? PONV (postoperative nausea and vomiting)   ? Smoker   ? ?Past Surgical History:  ?Procedure Laterality Date  ? CESAREAN SECTION    ? X 2  ? MASTECTOMY WITH AXILLARY LYMPH NODE DISSECTION Right 08/04/2020  ? Procedure: RIGHT MASTECTOMY WITH RIGHT AXILLARY LYMPH NODE DISSECTION;  Surgeon: Rolm Bookbinder, MD;  Location: [place];  Service: General;  Laterality: Right;  ? PORTACATH PLACEMENT Left 03/17/2020  ? Procedure: INSERTION PORT-A-CATH;  Surgeon: Rolm Bookbinder, MD;  Location: [place];  Service: General;  Laterality: Left;  ? ?Patient Active Problem List  ? Diagnosis Date Noted  ? S/P mastectomy, right 08/04/2020  ? Chemotherapy-induced peripheral neuropathy ([place]) 05/20/2020  ? Port-A-Cath in place 03/19/2020  ? Malignant neoplasm of upper-outer quadrant of right breast in female, estrogen receptor positive ([place]) 03/10/2020  ? Fibroid   ? Smoker   ? Depression   ? ? ?REFERRING DIAG:  G62.0,T45.1X5A (ICD-10-CM) - Chemotherapy-induced peripheral neuropathy (HCC)  ? ?THERAPY DIAG:  ?Muscle weakness (generalized) ? ?Difficulty in walking, not elsewhere classified ? ?Other disturbances of skin sensation ? ?Abnormal posture ? ?PERTINENT  HISTORY: PERTINENT HISTORY: R breast cancer s/p R mastectomy on 08/04/20 with 2 R positive axillary nodes, high grade DCIS. Has completed chemo as of Oct 2022 and radiation 11/04/20 ? ?PRECAUTIONS: at risk for lymphedema on the R side ? ?SUBJECTIVE: I am tired when I leave here.  My legs were a little sore after last visit. I tried some of the exercises ?Are you having pain? Yes ?NPRS scale: 5/10 ?Pain location: Left hand, and big toes,both calves ?Pain orientation: Bilateral, Distal, and Upper  ?PAIN TYPE: burning, sharp, and cold ?Pain description: sharp and burning  ?Aggravating factors: cold, washing hands ?Relieving factors: wrapping in a blanket, using heating pad ? ?03/03/2021 ?COGNITION: ?           Overall cognitive status: Within functional limits for tasks assessed  ?  ?  ?OBSERVATIONS / OTHER ASSESSMENTS: pt sits with palms up to help decrease pain ?  ?SENSATION: ?           Light touch: Deficits barely able to detect and sometimes difficulty localizling area that is touched ?POSTURE: forward head, rounded shoulders ?  ?LE MMT:  ?R hip flexion: 4/5 ?L hip flexion: 3+/5 ?R hip abduction: 3/5 ?L hip abduction: 3/5 ?R hip extension: 2+/5 ?L hip extension: 2+/5 ?R knee extension: 5/5 ?L knee extension: 4/5 ?R knee flexion: 3/5 ?L knee flexion: 3/5 ?R ankle DF: 3/5 ?L ankle DF: 3-/5 ?  ?  GRIP STRENGTH: ?  ?R: 15 lbs avg for her age is 49.6 lbs ?L: 9 lbs avg for her age is 42 lbs ?  ?LYMPHEDEMA ASSESSMENTS:  ?  ?SURGERY TYPE/DATE: 08/04/20 - R mastectomy ?  ?NUMBER OF LYMPH NODES REMOVED: 2 nodes positive ?  ?CHEMOTHERAPY: Completed in 10 2022 ?  ?RADIATION:completed 11/2020 ?  ?  ?FUNCTIONAL TESTS:  ?30 seconds chair stand test; 6 reps which is poor for her age ?  ?GAIT: ?Distance walked: 20 feet ?Assistive device utilized: None ?Level of assistance: Modified independence ?Comments: narrow base of support, adduction, foot flat, increased kyphosis, shortened step length ?  ?  ?  ?  ? ?  ?  ?TODAYS  TREATMENT ?03/17/2021 ?NuStep UE 8, seat 7, resistance 5 x 5 min ?Lateral band walks 4 lengths bar with yellow band ?Monster walks with yellow band x 4 lengths of bar ?Standing heel raises x 15 no hands ?Marching in place and marching forward no hands 2 lengths ?SLS x 2 ea  ?Vector reaches bilaterally with color dots x 5 ea ?Mini squats x 15 holding bars ?LAQ 3x5 3lbs ea ?Supine ppt x 10 ?Supine SLR 2x 5 ?Bridges x 10 ? ?03/15/2021  ?Nu step Seat 8 UE 8 level 5 x 4 min ?Airex beam x 6 lengths forward and sideways without hands as much as possible, CGA of PT ?Step ups on ax x 10 bilaterally ?Marching on airex 2 x 10, heel raises on ax 2 x10 no hands ? vector reaches in SLS x 5 ea bilaterally ?Mini squats x 10 with hand hold ?Standing hip abd x 10 ? DF stretch on incline x 3 bilaterally, 20 sec ?LAQ 2 x5 with 2# bilaterally, sitting hip flexion 2x5 2# ?Mini squats x 10 ? seated clam 2 x 10 red theraband ?Seated toe raises, standing heel raises ?03/11/2021 ?Nu step 4 min LE seat 7, UE 8 lev 5 for 4 min ?Heel raises 2x10, marching 2 times 10 holding parallel bars, 1 x 10 no hand hold but CGA of PT, bilateral hip abd x 15, ?Step ups on airex x 10 with CGA of PT,  DLS on ax eyes open 2 x 30 feet together, DLS on ax feet together eyes closed 3 x 20 sec with CGA of PT, LAQ 2# ea LAQ 3 x 5 ea, bilateral hip flexion 2# sitting 2x5, DF stretch on incline 3 x 20 sec, 5 in step ups x 10 bilaterally, sitting clam with red theraband x 10 ? ? ?03/03/2021 ?No treatment performed today due to time contraints ?  ?PATIENT EDUCATION:  ?Education details: exs;  ?Person educated:  ?Education method: Explanation , demonstration, and Handouts ?Education comprehension: demonstrated understandintunderstanding ?  ?  ?HOME EXERCISE PROGRAM: ?Purchase hand strengthening kit and begin using this,[id number], LAQ, seated march, standing march, standing hip abd, sitting clam with red band, seated toe taps, standing heel raises, mmini squats ?   ?ASSESSMENT: ?  ?CLINICAL IMPRESSION: Pt performed sitting, supine and standing strength exs and standing balance exercises.  Left leg is visibly weaker  All exs were done with gait belt and CGA if pt was not holding bars in standing. Balance exercises were progressed and pt demonstrated good improvement with practice. Pt still unable to hold SLS for more than a sec or 2 and has difficulty getting her weight over the balance leg on both sides.  She was able to maintain tandem stance well with right leg forward, but with more wavering noted with left  leg forward.  Supine SLR was ?Difficult especially on left. ? ?  ?  ?OBJECTIVE IMPAIRMENTS Abnormal gait, decreased activity tolerance, decreased balance, decreased endurance, decreased knowledge of condition, decreased mobility, difficulty walking, decreased strength, impaired sensation, postural dysfunction, and pain.  ?  ?ACTIVITY LIMITATIONS cleaning, community activity, occupation, and shopping.  ?  ?PERSONAL FACTORS Fitness and Time since onset of injury/illness/exacerbation are also affecting patient's functional outcome.  ?  ?  ?REHAB POTENTIAL: Good ?  ?CLINICAL DECISION MAKING: Evolving/moderate complexity ?  ?EVALUATION COMPLEXITY: Moderate ?  ?  ?GOALS: ?Goals reviewed with patient? Yes ?  ?SHORT TERM GOALS: ?  ?STG Name Target Date Goal status  ?1 Pt will demonstrate 3+/5 bilateral hip abductor strength to decrease risk of falls. ?Baseline: 3/5 03/24/2021 INITIAL  ?2 Pt will be able to complete 8 sit to stands in 30 seconds to improve functional mobility.  ?Baseline: 6 reps 03/24/2021 INITIAL  ?3 Pt will improve bilateral grip strength to at least 20 lbs to allow pt to open objects.  ?Baseline: R 15lbs, L 9 lbs 03/24/2021 INITIAL  ?4 Pt will be independent in a home exercise program for long term stretching and strengthening. ?Baseline: 03/24/2021 INITIAL  ?  ?LONG TERM GOALS:  ?  ?LTG Name Target Date Goal status  ?1 Pt will demonstrate 4/5 bilateral hip  abductor strength to decrease risk of falls. ?Baseline: 3/5 04/14/2021 INITIAL  ?2 Pt will be able to complete 12 sit to stands in 30 seconds to improve functional mobility.  ?Baseline: 6 reps 04/14/2021 INITIAL  ?3 Pt will demonstrate bilatera

## 2021-03-22 ENCOUNTER — Other Ambulatory Visit: Payer: Self-pay

## 2021-03-22 ENCOUNTER — Ambulatory Visit: Payer: Managed Care, Other (non HMO)

## 2021-03-22 DIAGNOSIS — R262 Difficulty in walking, not elsewhere classified: Secondary | ICD-10-CM

## 2021-03-22 DIAGNOSIS — R293 Abnormal posture: Secondary | ICD-10-CM

## 2021-03-22 DIAGNOSIS — M6281 Muscle weakness (generalized): Secondary | ICD-10-CM

## 2021-03-22 DIAGNOSIS — R208 Other disturbances of skin sensation: Secondary | ICD-10-CM

## 2021-03-22 NOTE — Patient Instructions (Signed)
Access Code: [email or web address] ?URL: [email or web address] ?Date: 03/22/2021 ?Prepared by: Cheral Almas ? ?Exercises ?Supine Bridge - 1 x daily - 7 x weekly - 1 sets - 10 reps ?Hooklying Clamshell with Resistance - 1 x daily - 7 x weekly - 1 sets - 15 reps ?Supine March with Posterior Pelvic Tilt - 1 x daily - 7 x weekly - 1 sets - 10 reps ?Supine Pelvic Tilt with Straight Leg Raise - 1 x daily - 7 x weekly - 2 sets - 5 reps ? ?

## 2021-03-22 NOTE — Therapy (Signed)
?OUTPATIENT PHYSICAL THERAPY TREATMENT NOTE ? ? ?Patient Name: Sabrina Woods ?MRN: [id number] ?DOB:1955-04-09, 66 y.o., female ?Today's Date: 03/22/2021 ? ?PCP: [name], MD ?REFERRING PROVIDER: Deanne Coffer* ? ? PT End of Session - 03/22/21 1602   ? ? Visit Number 5   ? Number of Visits 13   ? Date for PT Re-Evaluation 04/14/21   ? PT Start Time 1603   ? Equipment Utilized During Treatment Gait belt   ? Activity Tolerance Patient tolerated treatment well   ? Behavior During Therapy [facility] for tasks assessed/performed   ? ?  ?  ? ?  ? ? ?Past Medical History:  ?Diagnosis Date  ? Acid reflux   ? Anxiety   ? Breast cancer ([place])   ? Breast cancer ([place]) 2022  ? Depression   ? HISTORY OF  ? Fibroid   ? PONV (postoperative nausea and vomiting)   ? Smoker   ? ?Past Surgical History:  ?Procedure Laterality Date  ? CESAREAN SECTION    ? X 2  ? MASTECTOMY WITH AXILLARY LYMPH NODE DISSECTION Right 08/04/2020  ? Procedure: RIGHT MASTECTOMY WITH RIGHT AXILLARY LYMPH NODE DISSECTION;  Surgeon: Rolm Bookbinder, MD;  Location: [place];  Service: General;  Laterality: Right;  ? PORTACATH PLACEMENT Left 03/17/2020  ? Procedure: INSERTION PORT-A-CATH;  Surgeon: Rolm Bookbinder, MD;  Location: [place];  Service: General;  Laterality: Left;  ? ?Patient Active Problem List  ? Diagnosis Date Noted  ? S/P mastectomy, right 08/04/2020  ? Chemotherapy-induced peripheral neuropathy ([place]) 05/20/2020  ? Port-A-Cath in place 03/19/2020  ? Malignant neoplasm of upper-outer quadrant of right breast in female, estrogen receptor positive ([place]) 03/10/2020  ? Fibroid   ? Smoker   ? Depression   ? ? ?REFERRING DIAG:  G62.0,T45.1X5A (ICD-10-CM) - Chemotherapy-induced peripheral neuropathy (HCC)  ? ?THERAPY DIAG:  ?Muscle weakness (generalized) ? ?Difficulty in walking, not elsewhere classified ? ?Other disturbances of skin sensation ? ?Abnormal posture ? ?PERTINENT HISTORY: PERTINENT HISTORY: R breast cancer s/p R  mastectomy on 08/04/20 with 2 R positive axillary nodes, high grade DCIS. Has completed chemo as of Oct 2022 and radiation 11/04/20 ? ?PRECAUTIONS: at risk for lymphedema on the R side ? ?SUBJECTIVE: I was a little tired the night I was here and the next day and I had some muscle soreness in the calf and thighs. Working on hand therapy a lot at home with the putty. I felt like it was starting to ease up until the cold weather came back. ? ? ? ?Are you having pain? Yes ?NPRS scale: 6-7/10 ?Pain location: Left hand, and big toes, ?Pain orientation: Bilateral, Distal, and Upper  ?PAIN TYPE: burning, sharp, and cold ?Pain description: sharp and burning  ?Aggravating factors: cold, washing hands ?Relieving factors: wrapping in a blanket, using heating pad ? ?03/03/2021 ?COGNITION: ?           Overall cognitive status: Within functional limits for tasks assessed  ?  ?  ?OBSERVATIONS / OTHER ASSESSMENTS: pt sits with palms up to help decrease pain ?  ?SENSATION: ?           Light touch: Deficits barely able to detect and sometimes difficulty localizling area that is touched ?POSTURE: forward head, rounded shoulders ?  ?LE MMT:  ?R hip flexion: 4/5 ?L hip flexion: 3+/5 ?R hip abduction: 3/5 ?L hip abduction: 3/5 ?R hip extension: 2+/5 ?L hip extension: 2+/5 ?R knee extension: 5/5 ?L knee extension: 4/5 ?R knee  flexion: 3/5 ?L knee flexion: 3/5 ?R ankle DF: 3/5 ?L ankle DF: 3-/5 ?  ?GRIP STRENGTH: ?  ?R: 15 lbs avg for her age is 49.6 lbs ?L: 9 lbs avg for her age is 42 lbs ?  ?LYMPHEDEMA ASSESSMENTS:  ?  ?SURGERY TYPE/DATE: 08/04/20 - R mastectomy ?  ?NUMBER OF LYMPH NODES REMOVED: 2 nodes positive ?  ?CHEMOTHERAPY: Completed in 10 2022 ?  ?RADIATION:completed 11/2020 ?  ?  ?FUNCTIONAL TESTS:  ?30 seconds chair stand test; 6 reps which is poor for her age ?  ?GAIT: ?Distance walked: 20 feet ?Assistive device utilized: None ?Level of assistance: Modified independence ?Comments: narrow base of support, adduction, foot flat,  increased kyphosis, shortened step length ?  ? ?  ?  ?TODAYS TREATMENT ?03/22/2021 ?Nu step UE 8, seat 7 resistance 5 x 5 min ?Mini squats to high table 2 x 10 ?Airex beam 0 to light touch 4 lengths forward and 4 lengths sideways ?Heel raises x 20 no hands ?Gastroc stretches on incline 3 x 20 sec ?Vector reaches x 5 B, SLS x 7 B ?5 in partial step ups x 12 with light hand hold ?Step and hold on airex x 10 bilaterally ?Seated LAQ 3 x 6 with 3# ?Supine ppt x10, ppt with SLR 2x 5 ?Supine ppt with marching with alt hand to knee x 10 ? ?03/17/2021 ?NuStep UE 8, seat 7, resistance 5 x 5 min ?Lateral band walks 4 lengths bar with yellow band ?Monster walks with yellow band x 4 lengths of bar ?Standing heel raises x 15 no hands ?Marching in place and marching forward no hands 2 lengths ?SLS x 2 ea  ?Vector reaches bilaterally with color dots x 5 ea ?Mini squats x 15 holding bars ?LAQ 3x5 3lbs ea ?Supine ppt x 10 ?Supine SLR 2x 5 ?Bridges x 10 ? ?03/15/2021  ?Nu step Seat 8 UE 8 level 5 x 4 min ?Airex beam x 6 lengths forward and sideways without hands as much as possible, CGA of PT ?Step ups on ax x 10 bilaterally ?Marching on airex 2 x 10, heel raises on ax 2 x10 no hands ? vector reaches in SLS x 5 ea bilaterally ?Mini squats x 10 with hand hold ?Standing hip abd x 10 ? DF stretch on incline x 3 bilaterally, 20 sec ?LAQ 2 x5 with 2# bilaterally, sitting hip flexion 2x5 2# ?Mini squats x 10 ? seated clam 2 x 10 red theraband ?Seated toe raises, standing heel raises ?03/11/2021 ?Nu step 4 min LE seat 7, UE 8 lev 5 for 4 min ?Heel raises 2x10, marching 2 times 10 holding parallel bars, 1 x 10 no hand hold but CGA of PT, bilateral hip abd x 15, ?Step ups on airex x 10 with CGA of PT,  DLS on ax eyes open 2 x 30 feet together, DLS on ax feet together eyes closed 3 x 20 sec with CGA of PT, LAQ 2# ea LAQ 3 x 5 ea, bilateral hip flexion 2# sitting 2x5, DF stretch on incline 3 x 20 sec, 5 in step ups x 10 bilaterally, sitting clam with  red theraband x 10 ? ? ?03/03/2021 ?No treatment performed today due to time contraints ?  ?PATIENT EDUCATION:  ?Education details: exs; [id number] ?Person educated: patient ?Education method: Explanation , demonstration, and Handouts ?Education comprehension: demonstrated understandintunderstanding ?  ?  ?HOME EXERCISE PROGRAM: ?Purchase hand strengthening kit and begin using this,[id number], LAQ, seated march, standing march, standing hip abd, sitting  clam with red band, seated toe taps, standing heel raises, mini squats,Access Code: [email or web address] ?  ?ASSESSMENT: ?  ?CLINICAL IMPRESSION: Pt performed sitting, supine and standing strength exs and standing balance exercises.  Left leg continues to be visibly weaker and with mini squats gets IR of right knee if pt is not  concious of correcting.  She was able to maintain SLS bilaterally a few seconds longer today and was able to perform vector reaches with improved ease. She is more relaxed now with all of her balance activities despite the challenges.All exs were done with gait belt and CGA if pt was not holding bars in standing.  ?  ?  ?OBJECTIVE IMPAIRMENTS Abnormal gait, decreased activity tolerance, decreased balance, decreased endurance, decreased knowledge of condition, decreased mobility, difficulty walking, decreased strength, impaired sensation, postural dysfunction, and pain.  ?  ?ACTIVITY LIMITATIONS cleaning, community activity, occupation, and shopping.  ?  ?PERSONAL FACTORS Fitness and Time since onset of injury/illness/exacerbation are also affecting patient's functional outcome.  ?  ?  ?REHAB POTENTIAL: Good ?  ?CLINICAL DECISION MAKING: Evolving/moderate complexity ?  ?EVALUATION COMPLEXITY: Moderate ?  ?  ?GOALS: ?Goals reviewed with patient? Yes ?  ?SHORT TERM GOALS: ?  ?STG Name Target Date Goal status  ?1 Pt will demonstrate 3+/5 bilateral hip abductor strength to decrease risk of falls. ?Baseline: 3/5 03/24/2021 INITIAL  ?2 Pt will be able to complete 8  sit to stands in 30 seconds to improve functional mobility.  ?Baseline: 6 reps 03/24/2021 INITIAL  ?3 Pt will improve bilateral grip strength to at least 20 lbs to allow pt to open objects.  ?Baseline: R 15lbs, L 9

## 2021-03-24 ENCOUNTER — Ambulatory Visit: Payer: Managed Care, Other (non HMO)

## 2021-03-24 ENCOUNTER — Other Ambulatory Visit: Payer: Self-pay

## 2021-03-24 ENCOUNTER — Ambulatory Visit
Admission: RE | Admit: 2021-03-24 | Discharge: 2021-03-24 | Disposition: A | Discharge status: Home / Self Care | Payer: Managed Care, Other (non HMO) | Source: Ambulatory Visit | Attending: Adult Health | Admitting: Adult Health

## 2021-03-24 DIAGNOSIS — R262 Difficulty in walking, not elsewhere classified: Secondary | ICD-10-CM

## 2021-03-24 DIAGNOSIS — R293 Abnormal posture: Secondary | ICD-10-CM

## 2021-03-24 DIAGNOSIS — Z170 Estrogen receptor positive status [ER+]: Secondary | ICD-10-CM

## 2021-03-24 DIAGNOSIS — Z1231 Encounter for screening mammogram for malignant neoplasm of breast: Secondary | ICD-10-CM

## 2021-03-24 DIAGNOSIS — M6281 Muscle weakness (generalized): Secondary | ICD-10-CM

## 2021-03-24 DIAGNOSIS — R208 Other disturbances of skin sensation: Secondary | ICD-10-CM

## 2021-03-24 NOTE — Patient Instructions (Signed)
?  Access Code: [email or web address] ?URL: [email or web address] ?Date: 03/24/2021 ?Prepared by: Cheral Almas ? ?Exercises ?- Standing Gastroc Stretch  - 1 x daily - 7 x weekly - 1 sets - 3 reps - 30 hold ?- Long Sitting Ankle Dorsiflexion with Anchored Resistance  - 1 x daily - 7 x weekly - 1-2 sets - 10 reps - 2 hold ?- Seated Ankle Eversion with Resistance  - 1 x daily - 7 x weekly - 1-2 sets - 10 reps - 2 hold ?- Seated Ankle Inversion with Resistance  - 1 x daily - 7 x weekly - 1-2 sets - 10 reps - 2 hold ? ?

## 2021-03-24 NOTE — Therapy (Signed)
?OUTPATIENT PHYSICAL THERAPY TREATMENT NOTE ? ? ?Patient Name: Sabrina Woods ?MRN: [id number] ?DOB:03-01-1955, 66 y.o., female ?Today's Date: 03/24/2021 ? ?PCP: [name], MD ?REFERRING PROVIDER: [name], MD ? ? PT End of Session - 03/24/21 1610   ? ? Visit Number 6   ? Number of Visits 13   ? Date for PT Re-Evaluation 04/14/21   ? PT Start Time 8250   ? PT Stop Time 1655   ? PT Time Calculation (min) 51 min   ? Equipment Utilized During Treatment Gait belt   ? Activity Tolerance Patient tolerated treatment well   ? Behavior During Therapy [facility] for tasks assessed/performed   ? ?  ?  ? ?  ? ? ? ?Past Medical History:  ?Diagnosis Date  ? Acid reflux   ? Anxiety   ? Breast cancer ([place])   ? Breast cancer ([place]) 2022  ? Depression   ? HISTORY OF  ? Fibroid   ? PONV (postoperative nausea and vomiting)   ? Smoker   ? ?Past Surgical History:  ?Procedure Laterality Date  ? CESAREAN SECTION    ? X 2  ? MASTECTOMY    ? MASTECTOMY WITH AXILLARY LYMPH NODE DISSECTION Right 08/04/2020  ? Procedure: RIGHT MASTECTOMY WITH RIGHT AXILLARY LYMPH NODE DISSECTION;  Surgeon: Rolm Bookbinder, MD;  Location: [place];  Service: General;  Laterality: Right;  ? PORTACATH PLACEMENT Left 03/17/2020  ? Procedure: INSERTION PORT-A-CATH;  Surgeon: Rolm Bookbinder, MD;  Location: [place];  Service: General;  Laterality: Left;  ? ?Patient Active Problem List  ? Diagnosis Date Noted  ? S/P mastectomy, right 08/04/2020  ? Chemotherapy-induced peripheral neuropathy ([place]) 05/20/2020  ? Port-A-Cath in place 03/19/2020  ? Malignant neoplasm of upper-outer quadrant of right breast in female, estrogen receptor positive ([place]) 03/10/2020  ? Fibroid   ? Smoker   ? Depression   ? ? ?REFERRING DIAG:  G62.0,T45.1X5A (ICD-10-CM) - Chemotherapy-induced peripheral neuropathy (HCC)  ? ?THERAPY DIAG:  ?Muscle weakness (generalized) ? ?Difficulty in walking, not elsewhere classified ? ?Other disturbances of skin sensation ? ?Abnormal  posture ? ?Malignant neoplasm of upper-outer quadrant of right breast in female, estrogen receptor positive ([place]) ? ?PERTINENT HISTORY: PERTINENT HISTORY: R breast cancer s/p R mastectomy on 08/04/20 with 2 R positive axillary nodes, high grade DCIS. Has completed chemo as of Oct 2022 and radiation 11/04/20 ? ?PRECAUTIONS: at risk for lymphedema on the R side ? ?SUBJECTIVE: A little tired after last visit but not bad. Not as much muscle soreness. Overall I am feeling some better. Walking seems easier ? ?Are you having pain? Yes ?NPRS scale: 6/10 ?Pain location: Left hand greater than right hand, and foot numbness, ?Pain orientation: Bilateral, Distal, and Upper  ?PAIN TYPE: burning, sharp, and cold ?Pain description: sharp and burning  ?Aggravating factors: cold, washing hands ?Relieving factors: wrapping in a blanket, using heating pad ? ?03/03/2021 ?COGNITION: ?           Overall cognitive status: Within functional limits for tasks assessed  ?  ?  ?OBSERVATIONS / OTHER ASSESSMENTS: pt sits with palms up to help decrease pain ?  ?SENSATION: ?           Light touch: Deficits barely able to detect and sometimes difficulty localizling area that is touched ?POSTURE: forward head, rounded shoulders ?  ?LE MMT:  ?R hip flexion: 4/5 ?L hip flexion: 3+/5 ?R hip abduction: 3/5 ?L hip abduction: 3/5 ?R hip extension: 2+/5 ?L hip extension:  2+/5 ?R knee extension: 5/5 ?L knee extension: 4/5 ?R knee flexion: 3/5 ?L knee flexion: 3/5 ?R ankle DF: 3/5 ?L ankle DF: 3-/5 ?  ?GRIP STRENGTH: ?  ?R: 15 lbs avg for her age is 49.6 lbs ?L: 9 lbs avg for her age is 42 lbs ?  ?LYMPHEDEMA ASSESSMENTS:  ?  ?SURGERY TYPE/DATE: 08/04/20 - R mastectomy ?  ?NUMBER OF LYMPH NODES REMOVED: 2 nodes positive ?  ?CHEMOTHERAPY: Completed in 10 2022 ?  ?RADIATION:completed 11/2020 ?  ?  ?FUNCTIONAL TESTS:  ?30 seconds chair stand test; 6 reps which is poor for her age ?  ?GAIT: ?Distance walked: 20 feet ?Assistive device utilized: None ?Level of assistance:  Modified independence ?Comments: narrow base of support, adduction, foot flat, increased kyphosis, shortened step length ?  ? ?  ?  ?TODAYS TREATMENT ?30 sec sit to stand 12 reps ?Nu step 5 min UE 8, seat 7, resistance 5 x 5 min ?Runners DF stretch 3 x 30 sec B ?Ankle DF with yellow band x 10 B in long sitting, Ankle Inv and EV in sitting with foot on floor x 10 ea bilaterally ?Airex beam x 6 lengths forward and sideways with 0-intermittent hand touch and CGA of PT ?Step and hold on ax x10 ea with CGA ?Sit to stand 2 x 10 hands across chest and slow lowering ?Tandem stance bilaterally x 15 sec hold;required hand touch on right ? ? ? ?03/22/2021 ?Nu step UE 8, seat 7 resistance 5 x 5 min ?Mini squats to high table 2 x 10 ?Airex beam 0 to light touch 4 lengths forward and 4 lengths sideways ?Heel raises x 20 no hands ?Gastroc stretches on incline 3 x 20 sec ?Vector reaches x 5 B, SLS x 7 B ?5 in partial step ups x 12 with light hand hold ?Step and hold on airex x 10 bilaterally ?Seated LAQ 3 x 6 with 3# ?Supine ppt x10, ppt with SLR 2x 5 ?Supine ppt with marching with alt hand to knee x 10 ? ?03/17/2021 ?NuStep UE 8, seat 7, resistance 5 x 5 min ?Lateral band walks 4 lengths bar with yellow band ?Monster walks with yellow band x 4 lengths of bar ?Standing heel raises x 15 no hands ?Marching in place and marching forward no hands 2 lengths ?SLS x 2 ea  ?Vector reaches bilaterally with color dots x 5 ea ?Mini squats x 15 holding bars ?LAQ 3x5 3lbs ea ?Supine ppt x 10 ?Supine SLR 2x 5 ?Bridges x 10 ? ?03/15/2021  ?Nu step Seat 8 UE 8 level 5 x 4 min ?Airex beam x 6 lengths forward and sideways without hands as much as possible, CGA of PT ?Step ups on ax x 10 bilaterally ?Marching on airex 2 x 10, heel raises on ax 2 x10 no hands ? vector reaches in SLS x 5 ea bilaterally ?Mini squats x 10 with hand hold ?Standing hip abd x 10 ? DF stretch on incline x 3 bilaterally, 20 sec ?LAQ 2 x5 with 2# bilaterally, sitting hip flexion  2x5 2# ?Mini squats x 10 ? seated clam 2 x 10 red theraband ?Seated toe raises, standing heel raises ?03/11/2021 ?Nu step 4 min LE seat 7, UE 8 lev 5 for 4 min ?Heel raises 2x10, marching 2 times 10 holding parallel bars, 1 x 10 no hand hold but CGA of PT, bilateral hip abd x 15, ?Step ups on airex x 10 with CGA of PT,  DLS on ax eyes  open 2 x 30 feet together, DLS on ax feet together eyes closed 3 x 20 sec with CGA of PT, LAQ 2# ea LAQ 3 x 5 ea, bilateral hip flexion 2# sitting 2x5, DF stretch on incline 3 x 20 sec, 5 in step ups x 10 bilaterally, sitting clam with red theraband x 10 ? ? ? ?  ?PATIENT EDUCATION:  ?Education details: exs; Standing Gastroc Stretch  - 1 x daily - 7 x weekly - 1 sets - 3 reps - 30 hold ?- Long Sitting Ankle Dorsiflexion with Anchored Resistance  - 1 x daily - 7 x weekly - 1-2 sets - 10 reps - 2 hold ?- Seated Ankle Eversion with Resistance  - 1 x daily - 7 x weekly - 1-2 sets - 10 reps - 2 hold ?- Seated Ankle Inversion with Resistance  - 1 x daily - 7 x weekly - 1-2 sets - 10 reps - 2 hold ?Person educated: patient ?Education method: Explanation , demonstration, and Handouts ?Education comprehension: demonstrated understandintunderstanding ?  ?  ?HOME EXERCISE PROGRAM: ?Purchase hand strengthening kit and begin using this,[id number], LAQ, seated march, standing march, standing hip abd, sitting clam with red band, seated toe taps, standing heel raises, mini squats,Access Code: [email or web address] Ankle  gastroc stretch standing, Ankle theraband yellow DF, INV, EV ?  ?ASSESSMENT: ?  ?CLINICAL IMPRESSION: Pt performed sitting, supine and standing strength exs and standing balance exercises.  .All exs were done with gait belt and CGA if pt was not holding bars in standing. Pts HEP was updated with ankle ROM and strength exercises with yellow band. DF fairly difficult for pt and not able to go full range. Pt achieved 30 sec sit to stand goal today and HEP goal. ?  ?  ?OBJECTIVE IMPAIRMENTS Abnormal gait,  decreased activity tolerance, decreased balance, decreased endurance, decreased knowledge of condition, decreased mobility, difficulty walking, decreased strength, impaired sensation, postural dysfunct

## 2021-03-29 ENCOUNTER — Ambulatory Visit: Payer: Managed Care, Other (non HMO)

## 2021-03-29 ENCOUNTER — Other Ambulatory Visit: Payer: Self-pay

## 2021-03-29 DIAGNOSIS — R208 Other disturbances of skin sensation: Secondary | ICD-10-CM

## 2021-03-29 DIAGNOSIS — Z170 Estrogen receptor positive status [ER+]: Secondary | ICD-10-CM

## 2021-03-29 DIAGNOSIS — R293 Abnormal posture: Secondary | ICD-10-CM

## 2021-03-29 DIAGNOSIS — M6281 Muscle weakness (generalized): Secondary | ICD-10-CM

## 2021-03-29 DIAGNOSIS — R262 Difficulty in walking, not elsewhere classified: Secondary | ICD-10-CM

## 2021-03-29 NOTE — Therapy (Signed)
?OUTPATIENT PHYSICAL THERAPY TREATMENT NOTE ? ? ?Patient Name: Sabrina Woods ?MRN: [id number] ?DOB:Oct 14, 1955, 66 y.o., female ?Today's Date: 03/29/2021 ? ?PCP: [name], MD ?REFERRING PROVIDER: Deanne Coffer* ? ? PT End of Session - 03/29/21 1608   ? ? Visit Number 7   ? Number of Visits 13   ? Date for PT Re-Evaluation 04/14/21   ? PT Start Time 4035   ? PT Stop Time 1655   ? PT Time Calculation (min) 48 min   ? Equipment Utilized During Treatment Gait belt   ? Activity Tolerance Patient tolerated treatment well   ? Behavior During Therapy [facility] for tasks assessed/performed   ? ?  ?  ? ?  ? ? ? ?Past Medical History:  ?Diagnosis Date  ? Acid reflux   ? Anxiety   ? Breast cancer ([place])   ? Breast cancer ([place]) 2022  ? Depression   ? HISTORY OF  ? Fibroid   ? PONV (postoperative nausea and vomiting)   ? Smoker   ? ?Past Surgical History:  ?Procedure Laterality Date  ? CESAREAN SECTION    ? X 2  ? MASTECTOMY    ? MASTECTOMY WITH AXILLARY LYMPH NODE DISSECTION Right 08/04/2020  ? Procedure: RIGHT MASTECTOMY WITH RIGHT AXILLARY LYMPH NODE DISSECTION;  Surgeon: Rolm Bookbinder, MD;  Location: [place];  Service: General;  Laterality: Right;  ? PORTACATH PLACEMENT Left 03/17/2020  ? Procedure: INSERTION PORT-A-CATH;  Surgeon: Rolm Bookbinder, MD;  Location: [place];  Service: General;  Laterality: Left;  ? ?Patient Active Problem List  ? Diagnosis Date Noted  ? S/P mastectomy, right 08/04/2020  ? Chemotherapy-induced peripheral neuropathy ([place]) 05/20/2020  ? Port-A-Cath in place 03/19/2020  ? Malignant neoplasm of upper-outer quadrant of right breast in female, estrogen receptor positive ([place]) 03/10/2020  ? Fibroid   ? Smoker   ? Depression   ? ? ?REFERRING DIAG:  G62.0,T45.1X5A (ICD-10-CM) - Chemotherapy-induced peripheral neuropathy (HCC)  ? ?THERAPY DIAG:  ?Muscle weakness (generalized) ? ?Difficulty in walking, not elsewhere classified ? ?Other disturbances of skin  sensation ? ?Abnormal posture ? ?Malignant neoplasm of upper-outer quadrant of right breast in female, estrogen receptor positive ([place]) ? ?PERTINENT HISTORY: PERTINENT HISTORY: R breast cancer s/p R mastectomy on 08/04/20 with 2 R positive axillary nodes, high grade DCIS. Has completed chemo as of Oct 2022 and radiation 11/04/20 ? ?PRECAUTIONS: at risk for lymphedema on the R side ? ?SUBJECTIVE: I was sore the next day in my legs;calves and thighs. I was tired when I left but not too bad. I didn't do too many exercises this weekend. I feel better in terms of strength and endurance but my grip is still hard. ?Are you having pain? Yes ?NPRS scale: 6/10 hands, 5/10 feet ?Pain location: Left hand greater than right hand, and foot numbness, ?Pain orientation: Bilateral, Distal, and Upper  ?PAIN TYPE: burning, sharp, and cold ?Pain description: sharp and burning  ?Aggravating factors: cold, washing hands ?Relieving factors: wrapping in a blanket, using heating pad ? ?03/03/2021 ?COGNITION: ?           Overall cognitive status: Within functional limits for tasks assessed  ?  ?  ?OBSERVATIONS / OTHER ASSESSMENTS: pt sits with palms up to help decrease pain ?  ?SENSATION: ?           Light touch: Deficits barely able to detect and sometimes difficulty localizling area that is touched ?POSTURE: forward head, rounded shoulders ?  ?LE MMT:  ?  R hip flexion: 4/5 ?L hip flexion: 3+/5 ?R hip abduction: 3/5 ?L hip abduction: 3/5 ?R hip extension: 2+/5 ?L hip extension: 2+/5 ?R knee extension: 5/5 ?L knee extension: 4/5 ?R knee flexion: 3/5 ?L knee flexion: 3/5 ?R ankle DF: 3/5 ?L ankle DF: 3-/5 ?  ?GRIP STRENGTH: ?  ?R: 15 lbs avg for her age is 49.6 lbs,   03/29/2021  15,20,20= 18.3 avg ?L: 9 lbs avg for her age is 42 lbs          03/29/2021   5,7,11 avg 8 lbs ?  ?LYMPHEDEMA ASSESSMENTS:  ?  ?SURGERY TYPE/DATE: 08/04/20 - R mastectomy ?  ?NUMBER OF LYMPH NODES REMOVED: 2 nodes positive ?  ?CHEMOTHERAPY: Completed in 10 2022 ?   ?RADIATION:completed 11/2020 ?  ?  ?FUNCTIONAL TESTS:  ?30 seconds chair stand test; 6 reps which is poor for her age ?  ?GAIT: ?Distance walked: 20 feet ?Assistive device utilized: None ?Level of assistance: Modified independence ?Comments: narrow base of support, adduction, foot flat, increased kyphosis, shortened step length ?  ? ?  ?  ?TODAYS TREATMENT ?03/29/2021 ?Nu Step seat 7, UE 8 x 5 min ?Tested Grip strength: improved on right decreased slightly on left ?Gait in clinic with emphasis on heel strike and toe off x 250 ft ?Heel raises.toe raises in standing x 10 with PT CGA ?Marching on airex x 20 ?Standing hip abd, ext, and hip flexion with yellow Tband 2 x 10 ea holding bar 2 x 5 ?Lateral band walks;4 laps at bar ?Supine on table bridging 2 x 5 ?Ppt x 10 ?Ppt with SLR B 2 x 4 ?Ppt with march 2 x 10 ?DF stretch Bilaterally x3 by PT in supine ?Con/Eccentric DF strengthening with PT resistance x 5 B ?DF runners stretch 3 x 20 sec by pt in bars ? ?03/24/2021 ?30 sec sit to stand 12 reps ?Nu step 5 min UE 8, seat 7, resistance 5 x 5 min ?Runners DF stretch 3 x 30 sec B ?Ankle DF with yellow band x 10 B in long sitting, Ankle Inv and EV in sitting with foot on floor x 10 ea bilaterally ?Airex beam x 6 lengths forward and sideways with 0-intermittent hand touch and CGA of PT ?Step and hold on ax x10 ea with CGA ?Sit to stand 2 x 10 hands across chest and slow lowering ?Tandem stance bilaterally x 15 sec hold;required hand touch on right ? ? ? ?03/22/2021 ?Nu step UE 8, seat 7 resistance 5 x 5 min ?Mini squats to high table 2 x 10 ?Airex beam 0 to light touch 4 lengths forward and 4 lengths sideways ?Heel raises x 20 no hands ?Gastroc stretches on incline 3 x 20 sec ?Vector reaches x 5 B, SLS x 7 B ?5 in partial step ups x 12 with light hand hold ?Step and hold on airex x 10 bilaterally ?Seated LAQ 3 x 6 with 3# ?Supine ppt x10, ppt with SLR 2x 5 ?Supine ppt with marching with alt hand to knee x  10 ? ?03/17/2021 ?NuStep UE 8, seat 7, resistance 5 x 5 min ?Lateral band walks 4 lengths bar with yellow band ?Monster walks with yellow band x 4 lengths of bar ?Standing heel raises x 15 no hands ?Marching in place and marching forward no hands 2 lengths ?SLS x 2 ea  ?Vector reaches bilaterally with color dots x 5 ea ?Mini squats x 15 holding bars ?LAQ 3x5 3lbs ea ?Supine ppt x 10 ?Supine  SLR 2x 5 ?Bridges x 10 ? ? ? ? ? ?  ?PATIENT EDUCATION:  ?Education details: exs; Psychologist, counselling  - 1 x daily - 7 x weekly - 1 sets - 3 reps - 30 hold ?- Long Sitting Ankle Dorsiflexion with Anchored Resistance  - 1 x daily - 7 x weekly - 1-2 sets - 10 reps - 2 hold ?- Seated Ankle Eversion with Resistance  - 1 x daily - 7 x weekly - 1-2 sets - 10 reps - 2 hold ?- Seated Ankle Inversion with Resistance  - 1 x daily - 7 x weekly - 1-2 sets - 10 reps - 2 hold ?Person educated: patient ?Education method: Explanation , demonstration, and Handouts ?Education comprehension: demonstrated understandintunderstanding ?  ?  ?HOME EXERCISE PROGRAM: ?Purchase hand strengthening kit and begin using this,[id number], LAQ, seated march, standing march, standing hip abd, sitting clam with red band, seated toe taps, standing heel raises, mini squats,Access Code: [email or web address] Ankle  gastroc stretch standing, Ankle theraband yellow DF, INV, EV ?  ?ASSESSMENT: ?  ?CLINICAL IMPRESSION: Pt performed sitting, supine and standing strength exs and standing balance exercises.  Tested grip strength with right side improved by 3 lbs and left side decreased by 1 lb using hand grip #2. Pt has greater difficulty with left leg doing standing 3 D hip, and greater difficulty with SLR. We practiced gait today with emphasis on heel strike and toe off. Slap still heard, however, pt did demonstrate improved toe off with practice.. ?  ?  ?OBJECTIVE IMPAIRMENTS Abnormal gait, decreased activity tolerance, decreased balance, decreased endurance, decreased knowledge of  condition, decreased mobility, difficulty walking, decreased strength, impaired sensation, postural dysfunction, and pain.  ?  ?ACTIVITY LIMITATIONS cleaning, community activity, occupation, and shopping.  ?  ?PERSONAL FACTORS [employer] and [employer] since ons

## 2021-03-30 NOTE — Progress Notes (Signed)
? ?Patient Care Team: ?[name], MD as PCP - General (Hematology and Oncology) ?Rolm Bookbinder, MD as Consulting Physician (General Surgery) ?Kyung Rudd, MD as Consulting Physician (Radiation Oncology) ? ?DIAGNOSIS:  ?Encounter Diagnoses  ?Name Primary?  ? Malignant neoplasm of upper-outer quadrant of right breast in female, estrogen receptor positive ([place])   ? Chemotherapy-induced peripheral neuropathy ([place]) Yes  ? ? ?SUMMARY OF ONCOLOGIC HISTORY: ?Oncology History  ?Malignant neoplasm of upper-outer quadrant of right breast in female, estrogen receptor positive ([place])  ?03/10/2020 Initial Diagnosis  ? Palpable right breast mass x6 months.  Mammogram revealed left breast cysts (intraductal papilloma), 11 cm right breast mass and 2 masses 2.6 cm each in the right axilla: Biopsy grade 3 IDC ER 5% weak, PR 0%, Ki-67 25%, HER-2 3+ IHC positive ?  ?03/10/2020 Cancer Staging  ? Staging form: Breast, AJCC 8th Edition ?- Clinical stage from 03/10/2020: Stage IIIA (cT3, cN1, cM0, G3, ER+, PR-, HER2+) - Signed by [name], MD on 03/10/2020 ?Stage prefix: Initial diagnosis ? ?  ?03/19/2020 - 07/01/2020 Chemotherapy  ? [facility] Perjeta ?  ?08/04/2020 Surgery  ? Right mastectomy (Dr. 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  ?08/04/2020 Cancer Staging  ? Staging form: Breast, AJCC 8th Edition ?- Pathologic stage from 08/04/2020: No Stage Recommended (ypTis (DCIS), pN2, cM0) - Signed by Gardenia Phlegm, NP on 02/05/2021 ?Stage prefix: Post-therapy ? ?  ?08/21/2020 - 10/23/2020 Chemotherapy  ? Discontinued due to peripheral neuropathy ?Patient is on Treatment Plan : BREAST ADO-Trastuzumab Emtansine (Kadcyla) q21d  ?  ?  ?10/06/2020 - 11/04/2020 Radiation Therapy  ? Adjuvant radiation ?  ? ? ?CHIEF COMPLIANT:Follow- up right breast cancer  and neuropathy ? ?INTERVAL  HISTORY: Sabrina Woods is a 66 y.o. female with above-mentioned history of right breast cancer having undergone right  ?mastectomy and radiation therapy, currently on chemotherapy with Kadcyla today. She presents to the clinic today for follow-up. She complains that her hands and bottom/top of feet is burning.She complains of weakness mainly her left side.  Neuropathy appears to be slightly better but overall she still has persistent and severe neuropathy symptoms.  She is able to sleep better lately.  She is not able to make a fist.  She is going through physical therapy. ? ?ALLERGIES:  has No Known Allergies. ? ?MEDICATIONS:  ?Current Outpatient Medications  ?Medication Sig Dispense Refill  ? ALPRAZolam (XANAX) 0.25 MG tablet Take 1 tablet (0.25 mg total) by mouth 2 (two) times daily as needed for anxiety. 60 tablet 3  ? esomeprazole (NEXIUM) 20 MG capsule Take 20 mg by mouth daily.    ? loratadine (CLARITIN) 10 MG tablet Take 10 mg by mouth daily as needed for rhinitis.    ? methylPREDNISolone (MEDROL DOSEPAK) 4 MG TBPK tablet Take as directed on package 21 tablet 0  ? Multiple Vitamin (MULTIVITAMIN) capsule Take 1 capsule by mouth daily.    ? nicotine (NICODERM CQ) 14 mg/24hr patch Place 1 patch (14 mg total) onto the skin daily. 14 patch 0  ? DULoxetine 40 MG CPEP Take 40 mg by mouth daily. 90 capsule 3  ? gabapentin (NEURONTIN) 600 MG tablet Take 1 tablet (600 mg total) by mouth 3 (three) times daily. 90 tablet 3  ? ?No current facility-administered medications for this visit.  ? ? ?PHYSICAL EXAMINATION: ?ECOG PERFORMANCE STATUS: 2 - Symptomatic, <50%  confined to bed ? ?Vitals:  ? 03/31/21 1124  ?BP: (!) 150/80  ?Pulse: 87  ?Resp: 17  ?Temp: 97.7 ?F (36.5 ?C)  ?SpO2: 100%  ? ?Filed Weights  ? 03/31/21 1124  ?Weight: 177 lb (80.3 kg)  ? ?  ? ?LABORATORY DATA:  ?I have reviewed the data as listed ? ?  Latest Ref Rng & Units 11/11/2020  ? 10:04 AM 10/22/2020  ? 10:31 AM 10/02/2020  ?  9:33 AM  ?CMP  ?Glucose 70  - 99 mg/dL 231   212   238    ?BUN 8 - 23 mg/dL $Remove'9   8   9    'EJGRIBz$ ?Creatinine 0.44 - 1.00 mg/dL 0.77   0.79   0.83    ?Sodium 135 - 145 mmol/L 132   134   139    ?Potassium 3.5 - 5.1 mmol/L 3.0   3.5   3.8    ?Chloride 98 - 111 mmol/L 92   98   105    ?CO2 22 - 32 mmol/L $RemoveB'27   25   24    'jgtPAoPw$ ?Calcium 8.9 - 10.3 mg/dL 9.7   10.1   9.9    ?Total Protein 6.5 - 8.1 g/dL 7.5   7.6   7.5    ?Total Bilirubin 0.3 - 1.2 mg/dL 0.5   0.4   0.3    ?Alkaline Phos 38 - 126 U/L 100   101   106    ?AST 15 - 41 U/L $Remo'30   28   25    'MFEPb$ ?ALT 0 - 44 U/L 36   28   21    ? ? ?Lab Results  ?Component Value Date  ? WBC 7.9 11/11/2020  ? HGB 13.3 11/11/2020  ? HCT 39.1 11/11/2020  ? MCV 87.7 11/11/2020  ? PLT 291 11/11/2020  ? NEUTROABS 5.9 11/11/2020  ? ? ?ASSESSMENT & PLAN:  ?Malignant neoplasm of upper-outer quadrant of right breast in female, estrogen receptor positive ([place]) ?08/04/20: Right mastectomy (Dr. 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  ?Treatment Plan: ?1. Neoadjuvant chemotherapy with [place] Perjeta ?6 cycles followed by Kadcyla maintenance for 1 year ?2. Followed by mastectomy with sentinel lymph node study 08/04/20 ?3. Followed by adjuvant radiation therapy completed 11/04/2020 ?4.  Decided not to do antiestrogen therapy because her ER was 0% on final path. ?5.  Consideration for neratinib ?----------------------------------------------------------------------------------------------------------------- ?03/15/20: Bone Scan: Subtle uptake in ribs: Non diagnostic ?03/16/20: CT CAP: Large Rt breast mass with skin thickening, small sat lesion 1.8 cm, multiple enlarged LN, 4 mm LLL nodule ?---------------------------------------------------------------------------------------------------------------- ?Current Treatment: Kadcyla q 3 weeks, discontinued 11/11/2020 because of peripheral  neuropathy ?Echocardiogram 06/29/2020: EF 65 to 70% we will obtain her next echocardiogram in a month. ?Adj XRT will start 09/18/2020 ?  ?Chemo induced peripheral neuropathy: Currently on gabapentin with Cymbalta and PEA  ?Only a slight improvement in symptoms. ?Chemo-induced neuropathy versus immunotherapy related neuritis ?Sent for Medrol Dosepak ?Increase Cymbalta to 40 mg daily ?Neurology consultation ?B12 injections loading with weekly x4 followed by monthly ? ?Return to clinic in 4 weeks to assess symptoms ? ? ? ?Orders Placed This Encounter  ?Procedures  ? Ambulatory referral to Neurology  ?  Referral Priority:   Routine  ?  Referral Type:   Consultation  ?  Referral Reason:   Specialty Services Required  ?  Referred  to Provider:   Penni Bombard, MD  ?  Requested Specialty:   Neurology  ?  Number of Visits Requested:   1  ? ?The patient has a good understanding of the overall plan. she agrees with it. she will call with any problems that may develop before the next visit here. ?Total time spent: 30 mins including face to face time and time spent for planning, charting and co-ordination of care ? ? Harriette Ohara, MD ?03/31/21 ? ? ? I Gardiner Coins am scribing for Dr. Lindi Adie ? ?I have reviewed the above documentation for accuracy and completeness, and I agree with the above. ?  ?

## 2021-03-31 ENCOUNTER — Other Ambulatory Visit: Payer: Self-pay

## 2021-03-31 ENCOUNTER — Inpatient Hospital Stay: Payer: Managed Care, Other (non HMO) | Attending: Hematology and Oncology | Admitting: Hematology and Oncology

## 2021-03-31 ENCOUNTER — Ambulatory Visit: Payer: Managed Care, Other (non HMO)

## 2021-03-31 VITALS — BP 150/80 | HR 87 | Temp 97.7°F | Resp 17 | Ht 66.0 in | Wt 177.0 lb

## 2021-03-31 DIAGNOSIS — Z170 Estrogen receptor positive status [ER+]: Secondary | ICD-10-CM | POA: Diagnosis not present

## 2021-03-31 DIAGNOSIS — G620 Drug-induced polyneuropathy: Secondary | ICD-10-CM | POA: Insufficient documentation

## 2021-03-31 DIAGNOSIS — T451X5A Adverse effect of antineoplastic and immunosuppressive drugs, initial encounter: Secondary | ICD-10-CM

## 2021-03-31 DIAGNOSIS — C773 Secondary and unspecified malignant neoplasm of axilla and upper limb lymph nodes: Secondary | ICD-10-CM | POA: Diagnosis not present

## 2021-03-31 DIAGNOSIS — R208 Other disturbances of skin sensation: Secondary | ICD-10-CM

## 2021-03-31 DIAGNOSIS — M6281 Muscle weakness (generalized): Secondary | ICD-10-CM | POA: Diagnosis not present

## 2021-03-31 DIAGNOSIS — C50411 Malignant neoplasm of upper-outer quadrant of right female breast: Secondary | ICD-10-CM | POA: Insufficient documentation

## 2021-03-31 DIAGNOSIS — R262 Difficulty in walking, not elsewhere classified: Secondary | ICD-10-CM

## 2021-03-31 DIAGNOSIS — R293 Abnormal posture: Secondary | ICD-10-CM

## 2021-03-31 MED ORDER — METHYLPREDNISOLONE 4 MG PO TBPK: ORAL_TABLET | ORAL | 0 refills | Status: DC

## 2021-03-31 MED ORDER — GABAPENTIN 600 MG PO TABS: 600.0000 mg | ORAL_TABLET | Freq: Three times a day (TID) | ORAL | 3 refills | Status: DC

## 2021-03-31 MED ORDER — DULOXETINE HCL 40 MG PO CPEP
40.0000 mg | ORAL_CAPSULE | Freq: Every day | ORAL | 3 refills | Status: DC
Start: 2021-03-31 — End: 2021-10-20

## 2021-03-31 NOTE — Assessment & Plan Note (Addendum)
08/04/20:?Right mastectomy (Dr. 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 ?Treatment Plan: ?1. Neoadjuvant chemotherapy with [place] Perjeta ?6 cycles followed by Kadcyla maintenance for 1 year ?2. Followed by?mastectomy?with sentinel lymph node study?08/04/20 ?3. Followed by adjuvant radiation therapy completed 11/04/2020 ?4.??Decided not to do antiestrogen therapy because her ER was 0% on final path. ?5.??Consideration for neratinib ?----------------------------------------------------------------------------------------------------------------- ?03/15/20: Bone Scan: Subtle uptake in ribs: Non diagnostic ?03/16/20: CT CAP: Large Rt breast mass with skin thickening, small sat lesion 1.8 cm, multiple enlarged LN, 4 mm LLL nodule ?---------------------------------------------------------------------------------------------------------------- ?Current Treatment: Kadcyla q 3 weeks,?discontinued 11/11/2020 because of peripheral neuropathy ?Echocardiogram 06/29/2020: EF 65 to 70% we will obtain her next echocardiogram in a month. ?Adj XRT?will start?09/18/2020 ?? ?Chemo induced peripheral neuropathy: Currently on gabapentin with Cymbalta and PEA  ?Only a slight improvement in symptoms. ?Chemo-induced neuropathy versus immunotherapy related neuritis ?Sent for Medrol Dosepak ?Increase Cymbalta to 40 mg daily ?Neurology consultation ?B12 injections loading with weekly x4 followed by monthly ? ?Return to clinic in 4 weeks to assess symptoms ?

## 2021-03-31 NOTE — Therapy (Signed)
?OUTPATIENT PHYSICAL THERAPY TREATMENT NOTE ? ? ?Patient Name: Sabrina Woods ?MRN: [id number] ?DOB:06/09/1955, 66 y.o., female ?Today's Date: 03/31/2021 ? ?PCP: [name], MD ?REFERRING PROVIDER: Deanne Coffer* ? ? PT End of Session - 03/31/21 1603   ? ? Visit Number 8   ? Number of Visits 13   ? Date for PT Re-Evaluation 04/14/21   ? PT Start Time 1603   ? PT Stop Time 4098   ? PT Time Calculation (min) 49 min   ? Equipment Utilized During Treatment Gait belt   ? Activity Tolerance Patient tolerated treatment well   ? Behavior During Therapy [facility] for tasks assessed/performed   ? ?  ?  ? ?  ? ? ? ?Past Medical History:  ?Diagnosis Date  ? Acid reflux   ? Anxiety   ? Breast cancer ([place])   ? Breast cancer ([place]) 2022  ? Depression   ? HISTORY OF  ? Fibroid   ? PONV (postoperative nausea and vomiting)   ? Smoker   ? ?Past Surgical History:  ?Procedure Laterality Date  ? CESAREAN SECTION    ? X 2  ? MASTECTOMY    ? MASTECTOMY WITH AXILLARY LYMPH NODE DISSECTION Right 08/04/2020  ? Procedure: RIGHT MASTECTOMY WITH RIGHT AXILLARY LYMPH NODE DISSECTION;  Surgeon: Rolm Bookbinder, MD;  Location: [place];  Service: General;  Laterality: Right;  ? PORTACATH PLACEMENT Left 03/17/2020  ? Procedure: INSERTION PORT-A-CATH;  Surgeon: Rolm Bookbinder, MD;  Location: [place];  Service: General;  Laterality: Left;  ? ?Patient Active Problem List  ? Diagnosis Date Noted  ? S/P mastectomy, right 08/04/2020  ? Chemotherapy-induced peripheral neuropathy ([place]) 05/20/2020  ? Port-A-Cath in place 03/19/2020  ? Malignant neoplasm of upper-outer quadrant of right breast in female, estrogen receptor positive ([place]) 03/10/2020  ? Fibroid   ? Smoker   ? Depression   ? ? ?REFERRING DIAG:  G62.0,T45.1X5A (ICD-10-CM) - Chemotherapy-induced peripheral neuropathy (HCC)  ? ?THERAPY DIAG:  ?Muscle weakness (generalized) ? ?Difficulty in walking, not elsewhere classified ? ?Other disturbances of skin  sensation ? ?Abnormal posture ? ?Malignant neoplasm of upper-outer quadrant of right breast in female, estrogen receptor positive ([place]) ? ?PERTINENT HISTORY: PERTINENT HISTORY: R breast cancer s/p R mastectomy on 08/04/20 with 2 R positive axillary nodes, high grade DCIS. Has completed chemo as of Oct 2022 and radiation 11/04/20 ? ?PRECAUTIONS: at risk for lymphedema on the R side, balance deficits ? ?SUBJECTIVE: I was sore the next day in my legs;calves and thighs. I saw my MD today and he is putting me on B12 shots and steroids, and he wants me to see the neurologist for my neuropathy ? ?Are you having pain? Yes ?NPRS scale: 6/10 hands, 5/10 feet ?Pain location: Left hand greater than right hand, and foot numbness, ?Pain orientation: Bilateral, Distal, and Upper  ?PAIN TYPE: burning, sharp, and cold ?Pain description: sharp and burning  ?Aggravating factors: cold, washing hands ?Relieving factors: wrapping in a blanket, using heating pad ? ?03/03/2021 ?COGNITION: ?           Overall cognitive status: Within functional limits for tasks assessed  ?  ?  ?OBSERVATIONS / OTHER ASSESSMENTS: pt sits with palms up to help decrease pain ?  ?SENSATION: ?           Light touch: Deficits barely able to detect and sometimes difficulty localizling area that is touched ?POSTURE: forward head, rounded shoulders ?  ?LE MMT:  ?R hip flexion: 4/5 ?  L hip flexion: 3+/5 ?R hip abduction: 3/5 ?L hip abduction: 3/5 ?R hip extension: 2+/5 ?L hip extension: 2+/5 ?R knee extension: 5/5 ?L knee extension: 4/5 ?R knee flexion: 3/5 ?L knee flexion: 3/5 ?R ankle DF: 3/5 ?L ankle DF: 3-/5 ?  ?GRIP STRENGTH: ?  ?R: 15 lbs avg for her age is 49.6 lbs,   03/29/2021  15,20,20= 18.3 avg ?L: 9 lbs avg for her age is 42 lbs          03/29/2021   5,7,11 avg 8 lbs ?  ?LYMPHEDEMA ASSESSMENTS:  ?  ?SURGERY TYPE/DATE: 08/04/20 - R mastectomy ?  ?NUMBER OF LYMPH NODES REMOVED: 2 nodes positive ?  ?CHEMOTHERAPY: Completed in 10 2022 ?  ?RADIATION:completed 11/2020 ?   ?  ?FUNCTIONAL TESTS:  ?30 seconds chair stand test; 6 reps which is poor for her age 01/24/2021  12 reps ?  ?GAIT: ?Distance walked: 20 feet ?Assistive device utilized: None ?Level of assistance: Modified independence ?Comments: narrow base of support, adduction, foot flat, increased kyphosis, shortened step length ?  ? ?  ?  ?TODAYS TREATMENT ?03/31/2021 ?NuStep seat 7, UE 8 x 5 min lev 5 ?Gait in clinic practicing heel strike/toe off and arm swing x 300 ft ?Partial step ups 5 in 2 x 10 B, sideways B x 10 ?Rocker board forward and back with 10 sec hold for stretch x 6 ?Rocker board side to side x 5 ea no hand hold for balance ,CGA of PT ?Airex step and hold bilaterally x 12, light hand touch ?DF stretch Bilaterally x3 by PT in supine ?Con/Eccentric DF strengthening with PT resistance x 5 B ?Ppt with SLR 2 x5, ?Sit to stand from low mat table 2 x5 ? ?03/29/2021 ?Nu Step seat 7, UE 8 x 5 min ?Tested Grip strength: improved on right decreased slightly on left ?Gait in clinic with emphasis on heel strike and toe off x 250 ft ?Heel raises.toe raises in standing x 10 with PT CGA ?Marching on airex x 20 ?Standing hip abd, ext, and hip flexion with yellow Tband 2 x 10 ea holding bar 2 x 5 ?Lateral band walks;4 laps at bar ?Supine on table bridging 2 x 5 ?Ppt x 10 ?Ppt with SLR B 2 x 4 ?Ppt with march 2 x 10 ?DF stretch Bilaterally x3 by PT in supine ?Con/Eccentric DF strengthening with PT resistance x 5 B ?DF runners stretch 3 x 20 sec by pt in bars ? ?03/24/2021 ?30 sec sit to stand 12 reps ?Nu step 5 min UE 8, seat 7, resistance 5 x 5 min ?Runners DF stretch 3 x 30 sec B ?Ankle DF with yellow band x 10 B in long sitting, Ankle Inv and EV in sitting with foot on floor x 10 ea bilaterally ?Airex beam x 6 lengths forward and sideways with 0-intermittent hand touch and CGA of PT ?Step and hold on ax x10 ea with CGA ?Sit to stand 2 x 10 hands across chest and slow lowering ?Tandem stance bilaterally x 15 sec hold;required hand  touch on right ? ? ? ?03/22/2021 ?Nu step UE 8, seat 7 resistance 5 x 5 min ?Mini squats to high table 2 x 10 ?Airex beam 0 to light touch 4 lengths forward and 4 lengths sideways ?Heel raises x 20 no hands ?Gastroc stretches on incline 3 x 20 sec ?Vector reaches x 5 B, SLS x 7 B ?5 in partial step ups x 12 with light hand hold ?Step and hold  on airex x 10 bilaterally ?Seated LAQ 3 x 6 with 3# ?Supine ppt x10, ppt with SLR 2x 5 ?Supine ppt with marching with alt hand to knee x 10 ? ?3Bridges x 10 ? ? ? ? ?PATIENT EDUCATION:  ?Education details: exs; Standing Gastroc Stretch  - 1 x daily - 7 x weekly - 1 sets - 3 reps - 30 hold ?- Long Sitting Ankle Dorsiflexion with Anchored Resistance  - 1 x daily - 7 x weekly - 1-2 sets - 10 reps - 2 hold ?- Seated Ankle Eversion with Resistance  - 1 x daily - 7 x weekly - 1-2 sets - 10 reps - 2 hold ?- Seated Ankle Inversion with Resistance  - 1 x daily - 7 x weekly - 1-2 sets - 10 reps - 2 hold ?Person educated: patient ?Education method: Explanation , demonstration, and Handouts ?Education comprehension: demonstrated understandintunderstanding ?  ?  ?HOME EXERCISE PROGRAM: ?Purchase hand strengthening kit and begin using this,[id number], LAQ, seated march, standing march, standing hip abd, sitting clam with red band, seated toe taps, standing heel raises, mini squats,Access Code: [email or web address] Ankle  gastroc stretch standing, Ankle theraband yellow DF, INV, EV ?  ?ASSESSMENT: ?  ?CLINICAL IMPRESSION: Pt performed sitting, supine and standing strength exs and standing balance exercises.  Pt is experiencing less foot slap on the right side with gait with attention to task, but still has left foot slap. Left leg fatigues sooner than right especially with front and lateral partial step ups. Pt has been more compliant with HEP. ?  ?  ?OBJECTIVE IMPAIRMENTS Abnormal gait, decreased activity tolerance, decreased balance, decreased endurance, decreased knowledge of condition, decreased  mobility, difficulty walking, decreased strength, impaired sensation, postural dysfunction, and pain.  ?  ?ACTIVITY LIMITATIONS cleaning, community activity, occupation, and shopping.  ?  ?PERSONAL FACTORS [employer]

## 2021-04-05 ENCOUNTER — Ambulatory Visit: Payer: Managed Care, Other (non HMO) | Attending: Adult Health | Admitting: Physical Therapy

## 2021-04-05 ENCOUNTER — Encounter: Payer: Self-pay | Admitting: Physical Therapy

## 2021-04-05 DIAGNOSIS — R293 Abnormal posture: Secondary | ICD-10-CM | POA: Insufficient documentation

## 2021-04-05 DIAGNOSIS — Z483 Aftercare following surgery for neoplasm: Secondary | ICD-10-CM | POA: Insufficient documentation

## 2021-04-05 DIAGNOSIS — C50411 Malignant neoplasm of upper-outer quadrant of right female breast: Secondary | ICD-10-CM | POA: Insufficient documentation

## 2021-04-05 DIAGNOSIS — Z170 Estrogen receptor positive status [ER+]: Secondary | ICD-10-CM | POA: Diagnosis present

## 2021-04-05 DIAGNOSIS — R262 Difficulty in walking, not elsewhere classified: Secondary | ICD-10-CM | POA: Diagnosis present

## 2021-04-05 DIAGNOSIS — M6281 Muscle weakness (generalized): Secondary | ICD-10-CM | POA: Diagnosis present

## 2021-04-05 DIAGNOSIS — R208 Other disturbances of skin sensation: Secondary | ICD-10-CM | POA: Diagnosis present

## 2021-04-05 NOTE — Therapy (Signed)
?OUTPATIENT PHYSICAL THERAPY TREATMENT NOTE ? ? ?Patient Name: Sabrina Woods ?MRN: [id number] ?DOB:1955/01/05, 66 y.o., female ?Today's Date: 04/05/2021 ? ?PCP: [name], MD ?REFERRING PROVIDER: Deanne Coffer* ? ? PT End of Session - 04/05/21 1559   ? ? Visit Number 9   ? Number of Visits 13   ? Date for PT Re-Evaluation 04/14/21   ? PT Start Time 1600   ? ?  ?  ? ?  ? ? ? ?Past Medical History:  ?Diagnosis Date  ? Acid reflux   ? Anxiety   ? Breast cancer ([place])   ? Breast cancer ([place]) 2022  ? Depression   ? HISTORY OF  ? Fibroid   ? PONV (postoperative nausea and vomiting)   ? Smoker   ? ?Past Surgical History:  ?Procedure Laterality Date  ? CESAREAN SECTION    ? X 2  ? MASTECTOMY    ? MASTECTOMY WITH AXILLARY LYMPH NODE DISSECTION Right 08/04/2020  ? Procedure: RIGHT MASTECTOMY WITH RIGHT AXILLARY LYMPH NODE DISSECTION;  Surgeon: Rolm Bookbinder, MD;  Location: [place];  Service: General;  Laterality: Right;  ? PORTACATH PLACEMENT Left 03/17/2020  ? Procedure: INSERTION PORT-A-CATH;  Surgeon: Rolm Bookbinder, MD;  Location: [place];  Service: General;  Laterality: Left;  ? ?Patient Active Problem List  ? Diagnosis Date Noted  ? S/P mastectomy, right 08/04/2020  ? Chemotherapy-induced peripheral neuropathy ([place]) 05/20/2020  ? Port-A-Cath in place 03/19/2020  ? Malignant neoplasm of upper-outer quadrant of right breast in female, estrogen receptor positive ([place]) 03/10/2020  ? Fibroid   ? Smoker   ? Depression   ? ? ?REFERRING DIAG:  G62.0,T45.1X5A (ICD-10-CM) - Chemotherapy-induced peripheral neuropathy (HCC)  ? ?THERAPY DIAG:  ?Muscle weakness (generalized) ? ?Difficulty in walking, not elsewhere classified ? ?Other disturbances of skin sensation ? ?Abnormal posture ? ?Malignant neoplasm of upper-outer quadrant of right breast in female, estrogen receptor positive ([place]) ? ?PERTINENT HISTORY: PERTINENT HISTORY: R breast cancer s/p R mastectomy on 08/04/20 with 2 R positive  axillary nodes, high grade DCIS. Has completed chemo as of Oct 2022 and radiation 11/04/20 ? ?PRECAUTIONS: at risk for lymphedema on the R side, balance deficits ? ?SUBJECTIVE: I did pretty good after the last session. It was just a little. Nothing bad.  ? ?Are you having pain? Yes ?NPRS scale: 5/10 hands ?Pain location: Left hand greater than right hand, and foot numbness, ?Pain orientation: Bilateral, Distal, and Upper  ?PAIN TYPE: burning, sharp, and cold ?Pain description: sharp and burning  ?Aggravating factors: cold, washing hands ?Relieving factors: wrapping in a blanket, using heating pad ? ?03/03/2021 ?COGNITION: ?           Overall cognitive status: Within functional limits for tasks assessed  ?  ?  ?OBSERVATIONS / OTHER ASSESSMENTS: pt sits with palms up to help decrease pain ?  ?SENSATION: ?           Light touch: Deficits barely able to detect and sometimes difficulty localizling area that is touched ?POSTURE: forward head, rounded shoulders ?  ?LE MMT:  ?R hip flexion: 4/5 ?L hip flexion: 3+/5 ?R hip abduction: 3/5 ?L hip abduction: 3/5 ?R hip extension: 2+/5 ?L hip extension: 2+/5 ?R knee extension: 5/5 ?L knee extension: 4/5 ?R knee flexion: 3/5 ?L knee flexion: 3/5 ?R ankle DF: 3/5 ?L ankle DF: 3-/5 ?  ?GRIP STRENGTH: ?  ?R: 15 lbs avg for her age is 49.6 lbs,   03/29/2021  15,20,20= 18.3 avg ?  L: 9 lbs avg for her age is 42 lbs          03/29/2021   5,7,11 avg 8 lbs ?  ?LYMPHEDEMA ASSESSMENTS:  ?  ?SURGERY TYPE/DATE: 08/04/20 - R mastectomy ?  ?NUMBER OF LYMPH NODES REMOVED: 2 nodes positive ?  ?CHEMOTHERAPY: Completed in 10 2022 ?  ?RADIATION:completed 11/2020 ?  ?  ?FUNCTIONAL TESTS:  ?30 seconds chair stand test; 6 reps which is poor for her age 98/22/2023  12 reps ?  ?GAIT: ?Distance walked: 20 feet ?Assistive device utilized: None ?Level of assistance: Modified independence ?Comments: narrow base of support, adduction, foot flat, increased kyphosis, shortened step length ?  ? ?  ?  ?TODAYS  TREATMENT ? ?04/05/2021 ?TherEx:  ?NuStep seat 7, UE 8 x 5 min lev 5 ?Gait in clinic practicing heel strike/toe off and arm swing x 300 ft, v/c to increase foot clearance on L ?Ppt with SLR x 10  ?Rocker board forward and back with 10 reps with 3 - 30 sec stretches for calf tightness ?Rocker board side to side x 10 with hand held assist ?Airex balance beam heel/toe x 4 reps then sidestepping x 4 reps with v/c to increase step length on L ?Airex step and hold bilaterally x 6 bilaterally, light hand touch ?Manual Therapy:  ?DF stretch Bilaterally x5 by PT in supine ?Gentle metatarsal mobilization with decreased mobility in all metatarsals on the L foot and only between tarsal 1 and 2 on R foot, marked improvement in mobility noted after manual therapy ? ? ?03/31/2021 ?NuStep seat 7, UE 8 x 5 min lev 5 ?Gait in clinic practicing heel strike/toe off and arm swing x 300 ft ?Partial step ups 5 in 2 x 10 B, sideways B x 10 ?Rocker board forward and back with 10 sec hold for stretch x 6 ?Rocker board side to side x 5 ea no hand hold for balance ,CGA of PT ?Airex step and hold bilaterally x 12, light hand touch ?DF stretch Bilaterally x3 by PT in supine ?Con/Eccentric DF strengthening with PT resistance x 5 B ?Ppt with SLR 2 x5, ?Sit to stand from low mat table 2 x5 ? ?03/29/2021 ?Nu Step seat 7, UE 8 x 5 min ?Tested Grip strength: improved on right decreased slightly on left ?Gait in clinic with emphasis on heel strike and toe off x 250 ft ?Heel raises.toe raises in standing x 10 with PT CGA ?Marching on airex x 20 ?Standing hip abd, ext, and hip flexion with yellow Tband 2 x 10 ea holding bar 2 x 5 ?Lateral band walks;4 laps at bar ?Supine on table bridging 2 x 5 ?Ppt x 10 ?Ppt with SLR B 2 x 4 ?Ppt with march 2 x 10 ?DF stretch Bilaterally x3 by PT in supine ?Con/Eccentric DF strengthening with PT resistance x 5 B ?DF runners stretch 3 x 20 sec by pt in bars ? ?03/24/2021 ?30 sec sit to stand 12 reps ?Nu step 5 min UE 8, seat  7, resistance 5 x 5 min ?Runners DF stretch 3 x 30 sec B ?Ankle DF with yellow band x 10 B in long sitting, Ankle Inv and EV in sitting with foot on floor x 10 ea bilaterally ?Airex beam x 6 lengths forward and sideways with 0-intermittent hand touch and CGA of PT ?Step and hold on ax x10 ea with CGA ?Sit to stand 2 x 10 hands across chest and slow lowering ?Tandem stance bilaterally x 15 sec hold;required hand  touch on right ? ? ? ?PATIENT EDUCATION:  ?Education details: exs; Psychologist, counselling  - 1 x daily - 7 x weekly - 1 sets - 3 reps - 30 hold ?- Long Sitting Ankle Dorsiflexion with Anchored Resistance  - 1 x daily - 7 x weekly - 1-2 sets - 10 reps - 2 hold ?- Seated Ankle Eversion with Resistance  - 1 x daily - 7 x weekly - 1-2 sets - 10 reps - 2 hold ?- Seated Ankle Inversion with Resistance  - 1 x daily - 7 x weekly - 1-2 sets - 10 reps - 2 hold ?Person educated: patient ?Education method: Explanation , demonstration, and Handouts ?Education comprehension: demonstrated understandintunderstanding ? ?04/05/21- educated pt in self metatarsal mobilization to help improve foot mobility ?  ?  ?HOME EXERCISE PROGRAM: ?Purchase hand strengthening kit and begin using this,[id number], LAQ, seated march, standing march, standing hip abd, sitting clam with red band, seated toe taps, standing heel raises, mini squats,Access Code: [email or web address] Ankle  gastroc stretch standing, Ankle theraband yellow DF, INV, EV ?  ?ASSESSMENT: ?  ?CLINICAL IMPRESSION: Pt is continuing to demonstrate decreased heel strike/ toe off on L compared to R. She also tends to roll her R foot outwards during gait and SLS activities on R. She reports she has done this for a long time. She was able to tolerate quite a bit of exercise today with few recovery periods. Continued to focus on improving foot and ankle mobility to help improve gait and decrease fall risk.   ?  ?OBJECTIVE IMPAIRMENTS Abnormal gait, decreased activity tolerance, decreased  balance, decreased endurance, decreased knowledge of condition, decreased mobility, difficulty walking, decreased strength, impaired sensation, postural dysfunction, and pain.  ?  ?ACTIVITY LIMITATIONS cleaning, community act

## 2021-04-07 ENCOUNTER — Encounter: Payer: Self-pay | Admitting: Physical Therapy

## 2021-04-07 ENCOUNTER — Ambulatory Visit: Payer: Managed Care, Other (non HMO) | Admitting: Physical Therapy

## 2021-04-07 DIAGNOSIS — Z170 Estrogen receptor positive status [ER+]: Secondary | ICD-10-CM

## 2021-04-07 DIAGNOSIS — R293 Abnormal posture: Secondary | ICD-10-CM

## 2021-04-07 DIAGNOSIS — R208 Other disturbances of skin sensation: Secondary | ICD-10-CM

## 2021-04-07 DIAGNOSIS — M6281 Muscle weakness (generalized): Secondary | ICD-10-CM | POA: Diagnosis not present

## 2021-04-07 DIAGNOSIS — R262 Difficulty in walking, not elsewhere classified: Secondary | ICD-10-CM

## 2021-04-07 NOTE — Therapy (Signed)
?OUTPATIENT PHYSICAL THERAPY TREATMENT NOTE ? ? ?Patient Name: Sabrina Woods ?MRN: [id number] ?DOB:December 05, 1955, 66 y.o., female ?Today's Date: 04/07/2021 ? ?PCP: [name], MD ?REFERRING PROVIDER: Deanne Coffer* ? ? PT End of Session - 04/07/21 1606   ? ? Visit Number 10   ? Number of Visits 13   ? Date for PT Re-Evaluation 04/14/21   ? PT Start Time 3557   ? PT Stop Time 1655   ? PT Time Calculation (min) 51 min   ? Activity Tolerance Patient tolerated treatment well   ? Behavior During Therapy [facility] for tasks assessed/performed   ? ?  ?  ? ?  ? ? ? ? ?Past Medical History:  ?Diagnosis Date  ? Acid reflux   ? Anxiety   ? Breast cancer ([place])   ? Breast cancer ([place]) 2022  ? Depression   ? HISTORY OF  ? Fibroid   ? PONV (postoperative nausea and vomiting)   ? Smoker   ? ?Past Surgical History:  ?Procedure Laterality Date  ? CESAREAN SECTION    ? X 2  ? MASTECTOMY    ? MASTECTOMY WITH AXILLARY LYMPH NODE DISSECTION Right 08/04/2020  ? Procedure: RIGHT MASTECTOMY WITH RIGHT AXILLARY LYMPH NODE DISSECTION;  Surgeon: Rolm Bookbinder, MD;  Location: [place];  Service: General;  Laterality: Right;  ? PORTACATH PLACEMENT Left 03/17/2020  ? Procedure: INSERTION PORT-A-CATH;  Surgeon: Rolm Bookbinder, MD;  Location: [place];  Service: General;  Laterality: Left;  ? ?Patient Active Problem List  ? Diagnosis Date Noted  ? S/P mastectomy, right 08/04/2020  ? Chemotherapy-induced peripheral neuropathy ([place]) 05/20/2020  ? Port-A-Cath in place 03/19/2020  ? Malignant neoplasm of upper-outer quadrant of right breast in female, estrogen receptor positive ([place]) 03/10/2020  ? Fibroid   ? Smoker   ? Depression   ? ? ?REFERRING DIAG:  G62.0,T45.1X5A (ICD-10-CM) - Chemotherapy-induced peripheral neuropathy (HCC)  ? ?THERAPY DIAG:  ?Muscle weakness (generalized) ? ?Difficulty in walking, not elsewhere classified ? ?Other disturbances of skin sensation ? ?Abnormal posture ? ?Malignant neoplasm of upper-outer  quadrant of right breast in female, estrogen receptor positive ([place]) ? ?PERTINENT HISTORY: PERTINENT HISTORY: R breast cancer s/p R mastectomy on 08/04/20 with 2 R positive axillary nodes, high grade DCIS. Has completed chemo as of Oct 2022 and radiation 11/04/20 ? ?PRECAUTIONS: at risk for lymphedema on the R side, balance deficits ? ?SUBJECTIVE: I was tired after last time but not too tired.  ? ?Are you having pain? Yes ?NPRS scale: 4/10 ?Pain location: in the feet and hands ?Pain orientation: Bilateral, Distal, and Upper  ?PAIN TYPE: burning, sharp, and cold ?Pain description: shooting pains in feet  ?Aggravating factors: cold, washing hands ?Relieving factors: wrapping in a blanket, using heating pad ? ?03/03/2021 ?COGNITION: ?           Overall cognitive status: Within functional limits for tasks assessed  ?  ?  ?OBSERVATIONS / OTHER ASSESSMENTS: pt sits with palms up to help decrease pain ?  ?SENSATION: ?           Light touch: Deficits barely able to detect and sometimes difficulty localizling area that is touched ?POSTURE: forward head, rounded shoulders ?  ?LE MMT:  ?R hip flexion: 4/5 ?L hip flexion: 3+/5 ?R hip abduction: 3/5 ?L hip abduction: 3/5 ?R hip extension: 2+/5; 04/07/21: 3+/5 ?L hip extension: 2+/5; 04/07/21: 3+/5 ?R knee extension: 5/5 ?L knee extension: 4/5 ?R knee flexion: 3/5 ?L knee flexion: 3/5 ?  R ankle DF: 3/5 ?L ankle DF: 3-/5 ?  ?GRIP STRENGTH: ?  ?R: 15 lbs avg for her age is 49.6 lbs,   03/29/2021  15,20,20= 18.3 avg ?L: 9 lbs avg for her age is 42 lbs          03/29/2021   5,7,11 avg 8 lbs ?  ?LYMPHEDEMA ASSESSMENTS:  ?  ?SURGERY TYPE/DATE: 08/04/20 - R mastectomy ?  ?NUMBER OF LYMPH NODES REMOVED: 2 nodes positive ?  ?CHEMOTHERAPY: Completed in 10 2022 ?  ?RADIATION:completed 11/2020 ?  ?  ?FUNCTIONAL TESTS:  ?30 seconds chair stand test; 6 reps which is poor for her age 35/22/2023  12 reps ?  ?GAIT: ?Distance walked: 20 feet ?Assistive device utilized: None ?Level of assistance: Modified  independence ?Comments: narrow base of support, adduction, foot flat, increased kyphosis, shortened step length ?  ? ?  ?  ?TODAYS TREATMENT ? ?04/07/2021 ?TherEx:  ?NuStep seat 7, UE 8 x 5 min lev 5, steps 307 ?Gait in clinic practicing heel strike/toe off and arm swing x 300 ft, v/c to increase foot clearance on L with pt demonstrating improved dorsiflexion on L with v/c ?Heel raises x 20 reps ?Bridging x 10 ?Standing on airex: 3 way hip with v/c to keep knee straight and to hip hike in order to clear foam x 10 each ?Standing in colored circles with low cones tapping appropriate color with pt having increased difficulty on the L side and requiring CGA for balance ?Weaving in an out of small cones to work on changing directions with pt not having difficulty with this ?Ppt with SLR x 10  ?In prone: hip ext with knee straight and knee bent x 10 reps each ?Rocker board forward and back with 10 reps with 3 - 30 sec stretches for calf tightness ?Rocker board side to side x 10 with hand held assist ?Airex step and hold bilaterally x 10 bilaterally, light hand touch ?Manual Therapy:  ?DF stretch Bilaterally x5 by PT in supine ?Gentle metatarsal mobilization with decreased mobility between metatarsals 3 and 4 and 4 and 5 on the L foot and only between tarsal 1 and 2 on R foot, marked improvement in mobility noted after manual therapy ? ? ? ? ?04/05/2021 ?TherEx:  ?NuStep seat 7, UE 8 x 5 min lev 5 ?Gait in clinic practicing heel strike/toe off and arm swing x 300 ft, v/c to increase foot clearance on L ?Ppt with SLR x 10  ?Rocker board forward and back with 10 reps with 3 - 30 sec stretches for calf tightness ?Rocker board side to side x 10 with hand held assist ?Airex balance beam heel/toe x 4 reps then sidestepping x 4 reps with v/c to increase step length on L ?Airex step and hold bilaterally x 6 bilaterally, light hand touch ?Manual Therapy:  ?DF stretch Bilaterally x5 by PT in supine ?Gentle metatarsal mobilization with  decreased mobility in all metatarsals on the L foot and only between tarsal 1 and 2 on R foot, marked improvement in mobility noted after manual therapy ? ? ?03/31/2021 ?NuStep seat 7, UE 8 x 5 min lev 5 ?Gait in clinic practicing heel strike/toe off and arm swing x 300 ft ?Partial step ups 5 in 2 x 10 B, sideways B x 10 ?Rocker board forward and back with 10 sec hold for stretch x 6 ?Rocker board side to side x 5 ea no hand hold for balance ,CGA of PT ?Airex step and hold bilaterally x  12, light hand touch ?DF stretch Bilaterally x3 by PT in supine ?Con/Eccentric DF strengthening with PT resistance x 5 B ?Ppt with SLR 2 x5, ?Sit to stand from low mat table 2 x5 ? ?03/29/2021 ?Nu Step seat 7, UE 8 x 5 min ?Tested Grip strength: improved on right decreased slightly on left ?Gait in clinic with emphasis on heel strike and toe off x 250 ft ?Heel raises.toe raises in standing x 10 with PT CGA ?Marching on airex x 20 ?Standing hip abd, ext, and hip flexion with yellow Tband 2 x 10 ea holding bar 2 x 5 ?Lateral band walks;4 laps at bar ?Supine on table bridging 2 x 5 ?Ppt x 10 ?Ppt with SLR B 2 x 4 ?Ppt with march 2 x 10 ?DF stretch Bilaterally x3 by PT in supine ?Con/Eccentric DF strengthening with PT resistance x 5 B ?DF runners stretch 3 x 20 sec by pt in bars ? ? ? ? ?PATIENT EDUCATION:  ?Education details: exs; Psychologist, counselling  - 1 x daily - 7 x weekly - 1 sets - 3 reps - 30 hold ?- Long Sitting Ankle Dorsiflexion with Anchored Resistance  - 1 x daily - 7 x weekly - 1-2 sets - 10 reps - 2 hold ?- Seated Ankle Eversion with Resistance  - 1 x daily - 7 x weekly - 1-2 sets - 10 reps - 2 hold ?- Seated Ankle Inversion with Resistance  - 1 x daily - 7 x weekly - 1-2 sets - 10 reps - 2 hold ?Person educated: patient ?Education method: Explanation , demonstration, and Handouts ?Education comprehension: demonstrated understandintunderstanding ? ?04/05/21- educated pt in self metatarsal mobilization to help improve foot  mobility ?  ?  ?HOME EXERCISE PROGRAM: ?Purchase hand strengthening kit and begin using this,[id number], LAQ, seated march, standing march, standing hip abd, sitting clam with red band, seated toe taps, standi

## 2021-04-08 ENCOUNTER — Other Ambulatory Visit: Payer: Self-pay | Admitting: Hematology and Oncology

## 2021-04-08 ENCOUNTER — Other Ambulatory Visit: Payer: Self-pay

## 2021-04-08 ENCOUNTER — Inpatient Hospital Stay: Payer: Managed Care, Other (non HMO) | Attending: Hematology and Oncology

## 2021-04-08 DIAGNOSIS — G620 Drug-induced polyneuropathy: Secondary | ICD-10-CM | POA: Insufficient documentation

## 2021-04-08 DIAGNOSIS — R5383 Other fatigue: Secondary | ICD-10-CM

## 2021-04-08 DIAGNOSIS — C50411 Malignant neoplasm of upper-outer quadrant of right female breast: Secondary | ICD-10-CM | POA: Diagnosis present

## 2021-04-08 DIAGNOSIS — Z170 Estrogen receptor positive status [ER+]: Secondary | ICD-10-CM | POA: Diagnosis not present

## 2021-04-08 DIAGNOSIS — C773 Secondary and unspecified malignant neoplasm of axilla and upper limb lymph nodes: Secondary | ICD-10-CM | POA: Diagnosis not present

## 2021-04-08 MED ORDER — CYANOCOBALAMIN 1000 MCG/ML IJ SOLN
1000.0000 ug | Freq: Once | INTRAMUSCULAR | Status: AC
  Administered 2021-04-08: 1000 ug via INTRAMUSCULAR
  Filled 2021-04-08: qty 1

## 2021-04-12 ENCOUNTER — Ambulatory Visit: Payer: Managed Care, Other (non HMO) | Admitting: Physical Therapy

## 2021-04-12 ENCOUNTER — Encounter: Payer: Self-pay | Admitting: Physical Therapy

## 2021-04-12 DIAGNOSIS — R293 Abnormal posture: Secondary | ICD-10-CM

## 2021-04-12 DIAGNOSIS — R262 Difficulty in walking, not elsewhere classified: Secondary | ICD-10-CM

## 2021-04-12 DIAGNOSIS — M6281 Muscle weakness (generalized): Secondary | ICD-10-CM | POA: Diagnosis not present

## 2021-04-12 DIAGNOSIS — R208 Other disturbances of skin sensation: Secondary | ICD-10-CM

## 2021-04-12 DIAGNOSIS — Z170 Estrogen receptor positive status [ER+]: Secondary | ICD-10-CM

## 2021-04-12 NOTE — Therapy (Signed)
?OUTPATIENT PHYSICAL THERAPY TREATMENT NOTE ? ? ?Patient Name: Sabrina Woods ?MRN: [id number] ?DOB:1955/06/17, 66 y.o., female ?Today's Date: 04/12/2021 ? ?PCP: [name], MD ?REFERRING PROVIDER: Deanne Coffer* ? ? PT End of Session - 04/12/21 1606   ? ? Visit Number 11   ? Number of Visits 13   ? Date for PT Re-Evaluation 04/14/21   ? PT Start Time 1603   ? PT Stop Time 3016   ? PT Time Calculation (min) 49 min   ? Activity Tolerance Patient tolerated treatment well   ? Behavior During Therapy [facility] for tasks assessed/performed   ? ?  ?  ? ?  ? ? ? ? ? ?Past Medical History:  ?Diagnosis Date  ? Acid reflux   ? Anxiety   ? Breast cancer ([place])   ? Breast cancer ([place]) 2022  ? Depression   ? HISTORY OF  ? Fibroid   ? PONV (postoperative nausea and vomiting)   ? Smoker   ? ?Past Surgical History:  ?Procedure Laterality Date  ? CESAREAN SECTION    ? X 2  ? MASTECTOMY    ? MASTECTOMY WITH AXILLARY LYMPH NODE DISSECTION Right 08/04/2020  ? Procedure: RIGHT MASTECTOMY WITH RIGHT AXILLARY LYMPH NODE DISSECTION;  Surgeon: Rolm Bookbinder, MD;  Location: [place];  Service: General;  Laterality: Right;  ? PORTACATH PLACEMENT Left 03/17/2020  ? Procedure: INSERTION PORT-A-CATH;  Surgeon: Rolm Bookbinder, MD;  Location: [place];  Service: General;  Laterality: Left;  ? ?Patient Active Problem List  ? Diagnosis Date Noted  ? Fatigue 04/08/2021  ? S/P mastectomy, right 08/04/2020  ? Chemotherapy-induced peripheral neuropathy ([place]) 05/20/2020  ? Port-A-Cath in place 03/19/2020  ? Malignant neoplasm of upper-outer quadrant of right breast in female, estrogen receptor positive ([place]) 03/10/2020  ? Fibroid   ? Smoker   ? Depression   ? ? ?REFERRING DIAG:  G62.0,T45.1X5A (ICD-10-CM) - Chemotherapy-induced peripheral neuropathy (HCC)  ? ?THERAPY DIAG:  ?Muscle weakness (generalized) ? ?Difficulty in walking, not elsewhere classified ? ?Other disturbances of skin sensation ? ?Abnormal  posture ? ?Malignant neoplasm of upper-outer quadrant of right breast in female, estrogen receptor positive ([place]) ? ?PERTINENT HISTORY: PERTINENT HISTORY: R breast cancer s/p R mastectomy on 08/04/20 with 2 R positive axillary nodes, high grade DCIS. Has completed chemo as of Oct 2022 and radiation 11/04/20 ? ?PRECAUTIONS: at risk for lymphedema on the R side, balance deficits ? ?SUBJECTIVE: I was tired after last time but not too tired.  ? ?Are you having pain? Yes ?NPRS scale: 4/10 ?Pain location:L hand ?Pain orientation: L hand ?PAIN TYPE: burning, sharp, and cold ?Pain description: shooting pains in feet  ?Aggravating factors: cold, washing hands ?Relieving factors: wrapping in a blanket, using heating pad ? ?03/03/2021 ?COGNITION: ?           Overall cognitive status: Within functional limits for tasks assessed  ?  ?  ?OBSERVATIONS / OTHER ASSESSMENTS: pt sits with palms up to help decrease pain ?  ?SENSATION: ?           Light touch: Deficits barely able to detect and sometimes difficulty localizling area that is touched ?POSTURE: forward head, rounded shoulders ?  ?LE MMT:  ?R hip flexion: 4/5 ?L hip flexion: 3+/5 ?R hip abduction: 3/5 ?L hip abduction: 3/5 ?R hip extension: 2+/5; 04/07/21: 3+/5 ?L hip extension: 2+/5; 04/07/21: 3+/5 ?R knee extension: 5/5 ?L knee extension: 4/5 ?R knee flexion: 3/5 ?L knee flexion: 3/5 ?R ankle  DF: 3/5 ?L ankle DF: 3-/5 ?  ?GRIP STRENGTH: ?  ?R: 15 lbs avg for her age is 49.6 lbs,   03/29/2021  15,20,20= 18.3 avg ?L: 9 lbs avg for her age is 42 lbs          03/29/2021   5,7,11 avg 8 lbs ?  ?LYMPHEDEMA ASSESSMENTS:  ?  ?SURGERY TYPE/DATE: 08/04/20 - R mastectomy ?  ?NUMBER OF LYMPH NODES REMOVED: 2 nodes positive ?  ?CHEMOTHERAPY: Completed in 10 2022 ?  ?RADIATION:completed 11/2020 ?  ?  ?FUNCTIONAL TESTS:  ?30 seconds chair stand test; 6 reps which is poor for her age 97/22/2023  12 reps ?  ?GAIT: ?Distance walked: 20 feet ?Assistive device utilized: None ?Level of assistance: Modified  independence ?Comments: narrow base of support, adduction, foot flat, increased kyphosis, shortened step length ?  ? ?  ?  ?TODAYS TREATMENT ? ?04/12/2021 ?TherEx:  ?NuStep seat 7, UE 8 x 5 min lev 5, steps 328 ?-3 way hip on air ex x 10 reps with v/c to keep knee straight ?-Sidestepping and heel toe x 4 each on air ex balance beam with occasional hand touch on bars ?-Step ups and side step ups on cobble stone x 10 reps each with occasional finger touch on bar for support ?- On flat side of bosu: weight shifting forward and back x 10 and side to side x 10, mini squats x 10 ?-Gait in clinic practicing heel strike/toe off and arm swing x 300 ft, v/c to increase foot clearance on L with pt demonstrating improved dorsiflexion on L with v/c ?- standing on rocker board weight shifting forward back and side to side x 10 each then 30 sec holds with weight shifted backwards for calf stretch x 2 ?-Bridging x 10 ?-Ppt with SLR x 10  ?- Stepping over hurdles initially with step to gait pattern x 2 over 4 hurdles then stepping through hurdles x 2 over 4 hurdles with pt requiring CGA ? ?Manual Therapy:  ?DF stretch Bilaterally x3 by PT in supine ?Gentle metatarsal mobilization with decreased mobility between metatarsals 1 and 2 on R foot, good mobility noted today in L foot, marked improvement in mobility noted after manual therapy ?  ?  ? ?04/07/2021 ?TherEx:  ?NuStep seat 7, UE 8 x 5 min lev 5, steps 307 ?Gait in clinic practicing heel strike/toe off and arm swing x 300 ft, v/c to increase foot clearance on L with pt demonstrating improved dorsiflexion on L with v/c ?Heel raises x 20 reps ?Bridging x 10 ?Standing on airex: 3 way hip with v/c to keep knee straight and to hip hike in order to clear foam x 10 each ?Standing in colored circles with low cones tapping appropriate color with pt having increased difficulty on the L side and requiring CGA for balance ?Weaving in an out of small cones to work on changing directions with pt  not having difficulty with this ?Ppt with SLR x 10  ?In prone: hip ext with knee straight and knee bent x 10 reps each ?Rocker board forward and back with 10 reps with 3 - 30 sec stretches for calf tightness ?Rocker board side to side x 10 with hand held assist ?Airex step and hold bilaterally x 10 bilaterally, light hand touch ?Manual Therapy:  ?DF stretch Bilaterally x5 by PT in supine ?Gentle metatarsal mobilization with decreased mobility between metatarsals 3 and 4 and 4 and 5 on the L foot and only between tarsal 1  and 2 on R foot, marked improvement in mobility noted after manual therapy ? ? ? ? ?04/05/2021 ?TherEx:  ?NuStep seat 7, UE 8 x 5 min lev 5 ?Gait in clinic practicing heel strike/toe off and arm swing x 300 ft, v/c to increase foot clearance on L ?Ppt with SLR x 10  ?Rocker board forward and back with 10 reps with 3 - 30 sec stretches for calf tightness ?Rocker board side to side x 10 with hand held assist ?Airex balance beam heel/toe x 4 reps then sidestepping x 4 reps with v/c to increase step length on L ?Airex step and hold bilaterally x 6 bilaterally, light hand touch ?Manual Therapy:  ?DF stretch Bilaterally x5 by PT in supine ?Gentle metatarsal mobilization with decreased mobility in all metatarsals on the L foot and only between tarsal 1 and 2 on R foot, marked improvement in mobility noted after manual therapy ? ? ?PATIENT EDUCATION:  ?Education details: exs; Psychologist, counselling  - 1 x daily - 7 x weekly - 1 sets - 3 reps - 30 hold ?- Long Sitting Ankle Dorsiflexion with Anchored Resistance  - 1 x daily - 7 x weekly - 1-2 sets - 10 reps - 2 hold ?- Seated Ankle Eversion with Resistance  - 1 x daily - 7 x weekly - 1-2 sets - 10 reps - 2 hold ?- Seated Ankle Inversion with Resistance  - 1 x daily - 7 x weekly - 1-2 sets - 10 reps - 2 hold ?Person educated: patient ?Education method: Explanation , demonstration, and Handouts ?Education comprehension: demonstrated  understandintunderstanding ? ?04/05/21- educated pt in self metatarsal mobilization to help improve foot mobility ?  ?  ?HOME EXERCISE PROGRAM: ?Purchase hand strengthening kit and begin using this,[id number], LAQ, seated march, standing march, standi

## 2021-04-14 ENCOUNTER — Encounter: Payer: Managed Care, Other (non HMO) | Admitting: Physical Therapy

## 2021-04-15 ENCOUNTER — Other Ambulatory Visit: Payer: Self-pay

## 2021-04-15 ENCOUNTER — Inpatient Hospital Stay: Payer: Managed Care, Other (non HMO)

## 2021-04-15 VITALS — BP 153/85 | HR 93 | Temp 98.7°F | Resp 18

## 2021-04-15 DIAGNOSIS — C50411 Malignant neoplasm of upper-outer quadrant of right female breast: Secondary | ICD-10-CM | POA: Diagnosis not present

## 2021-04-15 DIAGNOSIS — R5383 Other fatigue: Secondary | ICD-10-CM

## 2021-04-15 MED ORDER — CYANOCOBALAMIN 1000 MCG/ML IJ SOLN
1000.0000 ug | Freq: Once | INTRAMUSCULAR | Status: AC
  Administered 2021-04-15: 1000 ug via INTRAMUSCULAR
  Filled 2021-04-15: qty 1

## 2021-04-15 NOTE — Progress Notes (Signed)
? ?Patient Care Team: ?[name], MD as PCP - General (Hematology and Oncology) ?Rolm Bookbinder, MD as Consulting Physician (General Surgery) ?Kyung Rudd, MD as Consulting Physician (Radiation Oncology) ? ?DIAGNOSIS:  ?Encounter Diagnosis  ?Name Primary?  ? Malignant neoplasm of upper-outer quadrant of right breast in female, estrogen receptor positive ([place])   ? ? ?SUMMARY OF ONCOLOGIC HISTORY: ?Oncology History  ?Malignant neoplasm of upper-outer quadrant of right breast in female, estrogen receptor positive ([place])  ?03/10/2020 Initial Diagnosis  ? Palpable right breast mass x6 months.  Mammogram revealed left breast cysts (intraductal papilloma), 11 cm right breast mass and 2 masses 2.6 cm each in the right axilla: Biopsy grade 3 IDC ER 5% weak, PR 0%, Ki-67 25%, HER-2 3+ IHC positive ?  ?03/10/2020 Cancer Staging  ? Staging form: Breast, AJCC 8th Edition ?- Clinical stage from 03/10/2020: Stage IIIA (cT3, cN1, cM0, G3, ER+, PR-, HER2+) - Signed by [name], MD on 03/10/2020 ?Stage prefix: Initial diagnosis ? ?  ?03/19/2020 - 07/01/2020 Chemotherapy  ? [place] Perjeta ?  ?08/04/2020 Surgery  ? Right mastectomy (Dr. 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  ?08/04/2020 Cancer Staging  ? Staging form: Breast, AJCC 8th Edition ?- Pathologic stage from 08/04/2020: No Stage Recommended (ypTis (DCIS), pN2, cM0) - Signed by Gardenia Phlegm, NP on 02/05/2021 ?Stage prefix: Post-therapy ? ?  ?08/21/2020 - 10/23/2020 Chemotherapy  ? Discontinued due to peripheral neuropathy ?Patient is on Treatment Plan : BREAST ADO-Trastuzumab Emtansine (Kadcyla) q21d  ?  ?  ?10/06/2020 - 11/04/2020 Radiation Therapy  ? Adjuvant radiation ?  ? ? ?CHIEF COMPLIANT: Follow- up right breast cancer and neuropathy ? ?INTERVAL HISTORY: Sabrina Woods is a 66 y.o. female with  above-mentioned history of right breast cancer. State that the neuropathy is still there but its getting better. She was concerned about a rash that turns out to be shingles. ?She tells me that the neuropathy is slowly getting better with time.  She her energy levels have also improved with B12 injections. ? ? ?ALLERGIES:  has No Known Allergies. ? ?MEDICATIONS:  ?Current Outpatient Medications  ?Medication Sig Dispense Refill  ? ALPRAZolam (XANAX) 0.25 MG tablet Take 1 tablet (0.25 mg total) by mouth 2 (two) times daily as needed for anxiety. 60 tablet 3  ? DULoxetine (CYMBALTA) 30 MG capsule Take 30 mg by mouth daily.    ? DULoxetine 40 MG CPEP Take 40 mg by mouth daily. 90 capsule 3  ? esomeprazole (NEXIUM) 20 MG capsule Take 20 mg by mouth daily.    ? gabapentin (NEURONTIN) 600 MG tablet Take 1 tablet (600 mg total) by mouth 3 (three) times daily. 90 tablet 3  ? loratadine (CLARITIN) 10 MG tablet Take 10 mg by mouth daily as needed for rhinitis.    ? methylPREDNISolone (MEDROL DOSEPAK) 4 MG TBPK tablet Take as directed on package 21 tablet 0  ? Multiple Vitamin (MULTIVITAMIN) capsule Take 1 capsule by mouth daily.    ? nicotine (NICODERM CQ) 14 mg/24hr patch Place 1 patch (14 mg total) onto the skin daily. 14 patch 0  ? valACYclovir (VALTREX) 1000 MG tablet Take 1 tablet (1,000 mg total) by mouth 2 (two) times daily. 14 tablet 0  ? ?No current facility-administered medications for this visit.  ? ? ?PHYSICAL EXAMINATION: ?ECOG PERFORMANCE STATUS: 1 - Symptomatic but completely ambulatory ? ?Vitals:  ?  04/29/21 1108  ?BP: (!) 138/57  ?Pulse: 96  ?Resp: 18  ?Temp: (!) 97.4 ?F (36.3 ?C)  ?SpO2: 100%  ? ?Filed Weights  ? 04/29/21 1108  ?Weight: 180 lb 1.6 oz (81.7 kg)  ? ?  ? ?LABORATORY DATA:  ?I have reviewed the data as listed ? ?  Latest Ref Rng & Units 11/11/2020  ? 10:04 AM 10/22/2020  ? 10:31 AM 10/02/2020  ?  9:33 AM  ?CMP  ?Glucose 70 - 99 mg/dL 231   212   238    ?BUN 8 - 23 mg/dL $Remove'9   8   9    'cRMlBto$ ?Creatinine  0.44 - 1.00 mg/dL 0.77   0.79   0.83    ?Sodium 135 - 145 mmol/L 132   134   139    ?Potassium 3.5 - 5.1 mmol/L 3.0   3.5   3.8    ?Chloride 98 - 111 mmol/L 92   98   105    ?CO2 22 - 32 mmol/L $RemoveB'27   25   24    'PyivpIvv$ ?Calcium 8.9 - 10.3 mg/dL 9.7   10.1   9.9    ?Total Protein 6.5 - 8.1 g/dL 7.5   7.6   7.5    ?Total Bilirubin 0.3 - 1.2 mg/dL 0.5   0.4   0.3    ?Alkaline Phos 38 - 126 U/L 100   101   106    ?AST 15 - 41 U/L $Remo'30   28   25    'oGRQW$ ?ALT 0 - 44 U/L 36   28   21    ? ? ?Lab Results  ?Component Value Date  ? WBC 7.9 11/11/2020  ? HGB 13.3 11/11/2020  ? HCT 39.1 11/11/2020  ? MCV 87.7 11/11/2020  ? PLT 291 11/11/2020  ? NEUTROABS 5.9 11/11/2020  ? ? ?ASSESSMENT & PLAN:  ?Malignant neoplasm of upper-outer quadrant of right breast in female, estrogen receptor positive ([place]) ?08/04/20: Right mastectomy (Dr. 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  ?Treatment Plan: ?1. Neoadjuvant chemotherapy with [place] Perjeta ?6 cycles followed by Kadcyla maintenance discontinued 11/11/2020 because of peripheral neuropathy ?2. Followed by mastectomy with sentinel lymph node study 08/04/20 ?3. Followed by adjuvant radiation therapy 09/18/2020- 11/04/2020 ?4.  Decided not to do antiestrogen therapy because her ER was 0% on final path. ?5.  Consideration for neratinib ?----------------------------------------------------------------------------------------------------------------- ?03/15/20: Bone Scan: Subtle uptake in ribs: Non diagnostic ?03/16/20: CT CAP: Large Rt breast mass with skin thickening, small sat lesion 1.8 cm, multiple enlarged LN, 4 mm LLL nodule ?---------------------------------------------------------------------------------------------------------------- ?Current Treatment: Surveillance  ?  ?Chemo induced peripheral neuropathy: Currently on gabapentin with  Cymbalta and PEA  ?Marked improvement in neuropathy from last month. ?  ?Increase Cymbalta to 40 mg daily ?Neurology consultation ?B12 injections: No monthly ?  ?Patient is interested in [facility] for circulating tumor DNA testing. ? ?Return to clinic in  Aug ? ? ? ?No orders of the defined types were placed in this encounter. ? ?The patient has a good understanding of the overall plan. she agrees with it. she will call with any problems that may develop before the next visit here. ?Total time spent: 30 mins including face to face time and time spent for planning, charting and co-ordination of care ? ? Harriette Ohara, MD ?04/29/21 ? ? ? I Gardiner Coins am  scribing for Dr. Lindi Adie ? ?I have reviewed the above documentation for accuracy and completeness, and I agree with the above. ?  ?

## 2021-04-15 NOTE — Patient Instructions (Signed)
Vitamin B12 Injection ?What is this medication? ?Vitamin B12 (VAHY tuh min B12) prevents and treats low vitamin B12 levels in your body. It is used in people who do not get enough vitamin B12 from their diet or when their digestive tract does not absorb enough. Vitamin B12 plays an important role in maintaining the health of your nervous system and red blood cells. ?This medicine may be used for other purposes; ask your health care provider or pharmacist if you have questions. ?COMMON BRAND NAME(S): B-12 Compliance Kit, B-12 Injection Kit, Cyomin, Dodex, LA-12, Nutri-Twelve, Physicians EZ Use B-12, Primabalt ?What should I tell my care team before I take this medication? ?They need to know if you have any of these conditions: ?Kidney disease ?Leber's disease ?Megaloblastic anemia ?An unusual or allergic reaction to cyanocobalamin, cobalt, other medications, foods, dyes, or preservatives ?Pregnant or trying to get pregnant ?Breast-feeding ?How should I use this medication? ?This medication is injected into a muscle or deeply under the skin. It is usually given in a clinic or care team's office. However, your care team may teach you how to inject yourself. Follow all instructions. ?Talk to your care team about the use of this medication in children. Special care may be needed. ?Overdosage: If you think you have taken too much of this medicine contact a poison control center or emergency room at once. ?NOTE: This medicine is only for you. Do not share this medicine with others. ?What if I miss a dose? ?If you are given your dose at a clinic or care team's office, call to reschedule your appointment. If you give your own injections, and you miss a dose, take it as soon as you can. If it is almost time for your next dose, take only that dose. Do not take double or extra doses. ?What may interact with this medication? ?Colchicine ?Heavy alcohol intake ?This list may not describe all possible interactions. Give your health  care provider a list of all the medicines, herbs, non-prescription drugs, or dietary supplements you use. Also tell them if you smoke, drink alcohol, or use illegal drugs. Some items may interact with your medicine. ?What should I watch for while using this medication? ?Visit your care team regularly. You may need blood work done while you are taking this medication. ?You may need to follow a special diet. Talk to your care team. Limit your alcohol intake and avoid smoking to get the best benefit. ?What side effects may I notice from receiving this medication? ?Side effects that you should report to your care team as soon as possible: ?Allergic reactions--skin rash, itching, hives, swelling of the face, lips, tongue, or throat ?Swelling of the ankles, hands, or feet ?Trouble breathing ?Side effects that usually do not require medical attention (report to your care team if they continue or are bothersome): ?Diarrhea ?This list may not describe all possible side effects. Call your doctor for medical advice about side effects. You may report side effects to FDA at 1-800-FDA-1088. ?Where should I keep my medication? ?Keep out of the reach of children. ?Store at room temperature between 15 and 30 degrees C (59 and 85 degrees F). Protect from light. Throw away any unused medication after the expiration date. ?NOTE: This sheet is a summary. It may not cover all possible information. If you have questions about this medicine, talk to your doctor, pharmacist, or health care provider. ?? 2022 Elsevier/Gold Standard (2020-03-04 00:00:00) ? ?

## 2021-04-19 ENCOUNTER — Ambulatory Visit: Payer: Managed Care, Other (non HMO) | Admitting: Physical Therapy

## 2021-04-19 ENCOUNTER — Encounter: Payer: Self-pay | Admitting: Physical Therapy

## 2021-04-19 DIAGNOSIS — R262 Difficulty in walking, not elsewhere classified: Secondary | ICD-10-CM

## 2021-04-19 DIAGNOSIS — M6281 Muscle weakness (generalized): Secondary | ICD-10-CM

## 2021-04-19 DIAGNOSIS — R208 Other disturbances of skin sensation: Secondary | ICD-10-CM

## 2021-04-19 DIAGNOSIS — Z483 Aftercare following surgery for neoplasm: Secondary | ICD-10-CM

## 2021-04-19 DIAGNOSIS — Z170 Estrogen receptor positive status [ER+]: Secondary | ICD-10-CM

## 2021-04-19 DIAGNOSIS — R293 Abnormal posture: Secondary | ICD-10-CM

## 2021-04-19 NOTE — Therapy (Signed)
?OUTPATIENT PHYSICAL THERAPY TREATMENT NOTE ? ? ?Patient Name: Sabrina Woods ?MRN: [id number] ?DOB:[birth year], 66 y.o., female ?Today's Date: 04/19/2021 ? ?PCP: [name], MD ?REFERRING PROVIDER: Deanne Coffer* ? ? PT End of Session - 04/19/21 1654   ? ? Visit Number 12   ? Number of Visits 13   ? Date for PT Re-Evaluation 05/17/21   ? PT Start Time 8786   ? PT Stop Time 7672   ? PT Time Calculation (min) 40 min   ? Activity Tolerance Patient tolerated treatment well   ? Behavior During Therapy [facility] for tasks assessed/performed   ? ?  ?  ? ?  ? ? ? ? ? ? ?Past Medical History:  ?Diagnosis Date  ? Acid reflux   ? Anxiety   ? Breast cancer ([place])   ? Breast cancer ([place]) 2022  ? Depression   ? HISTORY OF  ? Fibroid   ? PONV (postoperative nausea and vomiting)   ? Smoker   ? ?Past Surgical History:  ?Procedure Laterality Date  ? CESAREAN SECTION    ? X 2  ? MASTECTOMY    ? MASTECTOMY WITH AXILLARY LYMPH NODE DISSECTION Right 08/04/2020  ? Procedure: RIGHT MASTECTOMY WITH RIGHT AXILLARY LYMPH NODE DISSECTION;  Surgeon: Rolm Bookbinder, MD;  Location: [place];  Service: General;  Laterality: Right;  ? PORTACATH PLACEMENT Left 03/17/2020  ? Procedure: INSERTION PORT-A-CATH;  Surgeon: Rolm Bookbinder, MD;  Location: [place];  Service: General;  Laterality: Left;  ? ?Patient Active Problem List  ? Diagnosis Date Noted  ? Fatigue 04/08/2021  ? S/P mastectomy, right 08/04/2020  ? Chemotherapy-induced peripheral neuropathy ([place]) 05/20/2020  ? Port-A-Cath in place 03/19/2020  ? Malignant neoplasm of upper-outer quadrant of right breast in female, estrogen receptor positive ([place]) 03/10/2020  ? Fibroid   ? Smoker   ? Depression   ? ? ?REFERRING DIAG:  G62.0,T45.1X5A (ICD-10-CM) - Chemotherapy-induced peripheral neuropathy (HCC)  ? ?THERAPY DIAG:  ?Muscle weakness (generalized) ? ?Difficulty in walking, not elsewhere classified ? ?Other disturbances of skin sensation ? ?Abnormal  posture ? ?Malignant neoplasm of upper-outer quadrant of right breast in female, estrogen receptor positive ([place]) ? ?Aftercare following surgery for neoplasm ? ?PERTINENT HISTORY: PERTINENT HISTORY: R breast cancer s/p R mastectomy on 08/04/20 with 2 R positive axillary nodes, high grade DCIS. Has completed chemo as of Oct 2022 and radiation 11/04/20 ? ?PRECAUTIONS: at risk for lymphedema on the R side, balance deficits ? ?SUBJECTIVE: I think the neuropathy is improving. I was ok after last session.  ? ?Are you having pain? Yes ?NPRS scale: 3/10 ?Pain location:L hand ?Pain orientation: L hand ?PAIN TYPE: stiffness ?Pain description: stiffness, feet are numb  ?Aggravating factors: cold, washing hands ?Relieving factors: wrapping in a blanket, using heating pad ? ?03/03/2021 ?COGNITION: ?           Overall cognitive status: Within functional limits for tasks assessed  ?  ?  ?OBSERVATIONS / OTHER ASSESSMENTS: pt sits with palms up to help decrease pain ?  ?SENSATION: ?           Light touch: Deficits barely able to detect and sometimes difficulty localizling area that is touched ?POSTURE: forward head, rounded shoulders ?  ?LE MMT:  ?R hip flexion: 4/5 ?L hip flexion: 3+/5 ?R hip abduction: 3/5; 04/19/21- 5/5 ?L hip abduction: 3/5; 04/19/21- 4/5 ?R hip extension: 2+/5; 04/07/21: 3+/5; 04/19/21- 4/5 ?L hip extension: 2+/5; 04/07/21: 3+/5; 04/19/21- 4/5 ?R knee extension: 5/5 ?  L knee extension: 4/5 ?R knee flexion: 3/5 ?L knee flexion: 3/5 ?R ankle DF: 3/5 ?L ankle DF: 3-/5 ?  ?GRIP STRENGTH: ?  ?R: 15 lbs avg for her age is 49.6 lbs,   03/29/2021  15,20,20= 18.3 avg; 04/19/21- 25 lbs ?L: 9 lbs avg for her age is 70 lbs          03/29/2021   5,7,11 avg 8 lbs; 04/19/21- 14 lbs ?  ?LYMPHEDEMA ASSESSMENTS:  ?  ?SURGERY TYPE/DATE: 08/04/20 - R mastectomy ?  ?NUMBER OF LYMPH NODES REMOVED: 2 nodes positive ?  ?CHEMOTHERAPY: Completed in 10 2022 ?  ?RADIATION:completed 11/2020 ?  ?  ?FUNCTIONAL TESTS:  ?30 seconds chair stand test; 6 reps which  is poor for her age 25/22/2023  12 reps ?  ?GAIT: ?Distance walked: 20 feet ?Assistive device utilized: None ?Level of assistance: Modified independence ?Comments: narrow base of support, adduction, foot flat, increased kyphosis, shortened step length ?  ? ?  ?  ?TODAYS TREATMENT ? ?04/19/21 ?See assessment and HEP sections. Spent session condensing and creating a home exercise program for pt to continue at home to improve strength and balance and decrease pain from peripheral neuropathy.  ? ?04/12/2021 ?TherEx:  ?NuStep seat 7, UE 8 x 5 min lev 5, steps 328 ?-3 way hip on air ex x 10 reps with v/c to keep knee straight ?-Sidestepping and heel toe x 4 each on air ex balance beam with occasional hand touch on bars ?-Step ups and side step ups on cobble stone x 10 reps each with occasional finger touch on bar for support ?- On flat side of bosu: weight shifting forward and back x 10 and side to side x 10, mini squats x 10 ?-Gait in clinic practicing heel strike/toe off and arm swing x 300 ft, v/c to increase foot clearance on L with pt demonstrating improved dorsiflexion on L with v/c ?- standing on rocker board weight shifting forward back and side to side x 10 each then 30 sec holds with weight shifted backwards for calf stretch x 2 ?-Bridging x 10 ?-Ppt with SLR x 10  ?- Stepping over hurdles initially with step to gait pattern x 2 over 4 hurdles then stepping through hurdles x 2 over 4 hurdles with pt requiring CGA ? ?Manual Therapy:  ?DF stretch Bilaterally x3 by PT in supine ?Gentle metatarsal mobilization with decreased mobility between metatarsals 1 and 2 on R foot, good mobility noted today in L foot, marked improvement in mobility noted after manual therapy ?  ?  ? ?04/07/2021 ?TherEx:  ?NuStep seat 7, UE 8 x 5 min lev 5, steps 307 ?Gait in clinic practicing heel strike/toe off and arm swing x 300 ft, v/c to increase foot clearance on L with pt demonstrating improved dorsiflexion on L with v/c ?Heel raises x 20  reps ?Bridging x 10 ?Standing on airex: 3 way hip with v/c to keep knee straight and to hip hike in order to clear foam x 10 each ?Standing in colored circles with low cones tapping appropriate color with pt having increased difficulty on the L side and requiring CGA for balance ?Weaving in an out of small cones to work on changing directions with pt not having difficulty with this ?Ppt with SLR x 10  ?In prone: hip ext with knee straight and knee bent x 10 reps each ?Rocker board forward and back with 10 reps with 3 - 30 sec stretches for calf tightness ?Rocker board side  to side x 10 with hand held assist ?Airex step and hold bilaterally x 10 bilaterally, light hand touch ?Manual Therapy:  ?DF stretch Bilaterally x5 by PT in supine ?Gentle metatarsal mobilization with decreased mobility between metatarsals 3 and 4 and 4 and 5 on the L foot and only between tarsal 1 and 2 on R foot, marked improvement in mobility noted after manual therapy ? ? ? ? ?04/05/2021 ?TherEx:  ?NuStep seat 7, UE 8 x 5 min lev 5 ?Gait in clinic practicing heel strike/toe off and arm swing x 300 ft, v/c to increase foot clearance on L ?Ppt with SLR x 10  ?Rocker board forward and back with 10 reps with 3 - 30 sec stretches for calf tightness ?Rocker board side to side x 10 with hand held assist ?Airex balance beam heel/toe x 4 reps then sidestepping x 4 reps with v/c to increase step length on L ?Airex step and hold bilaterally x 6 bilaterally, light hand touch ?Manual Therapy:  ?DF stretch Bilaterally x5 by PT in supine ?Gentle metatarsal mobilization with decreased mobility in all metatarsals on the L foot and only between tarsal 1 and 2 on R foot, marked improvement in mobility noted after manual therapy ? ? ?PATIENT EDUCATION:  ?Access Code: [email or web address] ?URL: [email or web address] ?Date: 04/19/2021 ?Prepared by: Manus Gunning ? ?Exercises ?- Seated Long Arc Quad  - 1 x daily - 7 x weekly - 3 sets - 10 reps ?- Standing  Hip Abduction with Counter Support  - 1 x daily - 7 x weekly - 1 sets - 10 reps ?- Standing Hip Extension with Counter Support  - 1 x daily - 7 x weekly - 1 sets - 10 reps ?- Standing Hip Flexion with Counter Support  -

## 2021-04-21 ENCOUNTER — Encounter: Payer: Managed Care, Other (non HMO) | Admitting: Physical Therapy

## 2021-04-22 ENCOUNTER — Inpatient Hospital Stay: Payer: Managed Care, Other (non HMO)

## 2021-04-22 ENCOUNTER — Other Ambulatory Visit: Payer: Self-pay

## 2021-04-22 DIAGNOSIS — C50411 Malignant neoplasm of upper-outer quadrant of right female breast: Secondary | ICD-10-CM | POA: Diagnosis not present

## 2021-04-22 DIAGNOSIS — R5383 Other fatigue: Secondary | ICD-10-CM

## 2021-04-22 MED ORDER — CYANOCOBALAMIN 1000 MCG/ML IJ SOLN
1000.0000 ug | Freq: Once | INTRAMUSCULAR | Status: AC
  Administered 2021-04-22: 1000 ug via INTRAMUSCULAR
  Filled 2021-04-22: qty 1

## 2021-04-27 ENCOUNTER — Other Ambulatory Visit: Payer: Self-pay | Admitting: Hematology and Oncology

## 2021-04-29 ENCOUNTER — Other Ambulatory Visit: Payer: Self-pay | Admitting: Hematology and Oncology

## 2021-04-29 ENCOUNTER — Other Ambulatory Visit: Payer: Self-pay

## 2021-04-29 ENCOUNTER — Inpatient Hospital Stay (HOSPITAL_BASED_OUTPATIENT_CLINIC_OR_DEPARTMENT_OTHER): Payer: Managed Care, Other (non HMO) | Admitting: Hematology and Oncology

## 2021-04-29 ENCOUNTER — Inpatient Hospital Stay: Payer: Managed Care, Other (non HMO)

## 2021-04-29 DIAGNOSIS — C50411 Malignant neoplasm of upper-outer quadrant of right female breast: Secondary | ICD-10-CM | POA: Diagnosis not present

## 2021-04-29 DIAGNOSIS — Z170 Estrogen receptor positive status [ER+]: Secondary | ICD-10-CM | POA: Diagnosis not present

## 2021-04-29 DIAGNOSIS — R5383 Other fatigue: Secondary | ICD-10-CM

## 2021-04-29 MED ORDER — CYANOCOBALAMIN 1000 MCG/ML IJ SOLN
1000.0000 ug | Freq: Once | INTRAMUSCULAR | Status: AC
  Administered 2021-04-29: 1000 ug via INTRAMUSCULAR
  Filled 2021-04-29: qty 1

## 2021-04-29 MED ORDER — VALACYCLOVIR HCL 1 G PO TABS: 1000.0000 mg | ORAL_TABLET | Freq: Two times a day (BID) | ORAL | 0 refills | Status: DC

## 2021-04-29 NOTE — Assessment & Plan Note (Addendum)
08/04/20:?Right mastectomy (Dr. 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 ?Treatment Plan: ?1. Neoadjuvant chemotherapy with Lewis Perjeta ?6 cycles followed by Kadcyla maintenance discontinued 11/11/2020 because of peripheral neuropathy ?2. Followed by?mastectomy?with sentinel lymph node study?08/04/20 ?3. Followed by adjuvant radiation therapy?09/18/2020- 11/04/2020 ?4.??Decided not to do antiestrogen therapy because her ER was 0% on final path. ?5.??Consideration for neratinib ?----------------------------------------------------------------------------------------------------------------- ?03/15/20: Bone Scan: Subtle uptake in ribs: Non diagnostic ?03/16/20: CT CAP: Large Rt breast mass with skin thickening, small sat lesion 1.8 cm, multiple enlarged LN, 4 mm LLL nodule ?---------------------------------------------------------------------------------------------------------------- ?Current Treatment: Surveillance  ?? ?Chemo induced peripheral neuropathy: Currently on gabapentin with Cymbalta?and PEA? ?Marked improvement in neuropathy from last month. ?  ?Increase Cymbalta to 40 mg daily ?Neurology consultation ?B12 injections: No monthly ?? ?Return to clinic in  Aug ?

## 2021-04-29 NOTE — Progress Notes (Signed)
[employer] requisition completed along with most recent progress note, demographics, insurance, path report and faxed to number provided on req. Fax was successful.  ?

## 2021-04-30 ENCOUNTER — Encounter: Payer: Self-pay | Admitting: Hematology and Oncology

## 2021-05-07 ENCOUNTER — Other Ambulatory Visit: Payer: Self-pay | Admitting: *Deleted

## 2021-05-07 MED ORDER — VALACYCLOVIR HCL 1 G PO TABS: 1000.0000 mg | ORAL_TABLET | Freq: Two times a day (BID) | ORAL | 0 refills | Status: DC

## 2021-05-10 ENCOUNTER — Other Ambulatory Visit: Payer: Self-pay | Admitting: Hematology and Oncology

## 2021-05-10 ENCOUNTER — Ambulatory Visit: Payer: Managed Care, Other (non HMO)

## 2021-05-13 ENCOUNTER — Telehealth: Payer: Self-pay | Admitting: *Deleted

## 2021-05-13 NOTE — Telephone Encounter (Signed)
Patient states no one from [facility] has contacted her for appt following referral.  ?She states she continues to experience neuropathy in hands and feet. ? ?Contacted [facility]: ?Dr. Lindi Adie referred patient to [facility] on 03/31/21 for evaluation of "severe chemotherapy induced neuropathy". ?Left message on [facility] Referral line with patient info and request they contact her for appt as soon as possible.  ?

## 2021-05-17 ENCOUNTER — Encounter: Payer: Self-pay | Admitting: *Deleted

## 2021-05-17 NOTE — Progress Notes (Signed)
Received fax from [facility] case Freight forwarder with [employer] proving our office with her direct fax and number if pt is in need of any future assistance.  Phone: [phone] ext [phone] and fax [phone]. ?

## 2021-05-19 ENCOUNTER — Ambulatory Visit: Payer: Managed Care, Other (non HMO) | Attending: Adult Health | Admitting: Physical Therapy

## 2021-05-19 ENCOUNTER — Encounter: Payer: Self-pay | Admitting: Physical Therapy

## 2021-05-19 DIAGNOSIS — Z170 Estrogen receptor positive status [ER+]: Secondary | ICD-10-CM | POA: Diagnosis present

## 2021-05-19 DIAGNOSIS — C50411 Malignant neoplasm of upper-outer quadrant of right female breast: Secondary | ICD-10-CM | POA: Insufficient documentation

## 2021-05-19 DIAGNOSIS — R293 Abnormal posture: Secondary | ICD-10-CM | POA: Insufficient documentation

## 2021-05-19 DIAGNOSIS — R208 Other disturbances of skin sensation: Secondary | ICD-10-CM | POA: Diagnosis present

## 2021-05-19 DIAGNOSIS — M6281 Muscle weakness (generalized): Secondary | ICD-10-CM | POA: Diagnosis present

## 2021-05-19 DIAGNOSIS — R262 Difficulty in walking, not elsewhere classified: Secondary | ICD-10-CM | POA: Diagnosis present

## 2021-05-19 NOTE — Therapy (Addendum)
?OUTPATIENT PHYSICAL THERAPY TREATMENT NOTE ? ? ?Patient Name: Sabrina Woods ?MRN: [id number] ?DOB:June 21, 1955, 66 y.o., female ?Today's Date: 05/19/2021 ? ?PCP: [name], MD ?REFERRING PROVIDER: Deanne Coffer* ? ? PT End of Session - 05/19/21 1635   ? ? Visit Number 13   ? Number of Visits 13   ? Date for PT Re-Evaluation 05/17/21   ? PT Start Time 1603   ? PT Stop Time 1630   ? PT Time Calculation (min) 27 min   ? Activity Tolerance Patient tolerated treatment well   ? Behavior During Therapy [facility] for tasks assessed/performed   ? ?  ?  ? ?  ? ? ? ? ? ? ? ?Past Medical History:  ?Diagnosis Date  ? Acid reflux   ? Anxiety   ? Breast cancer ([place])   ? Breast cancer ([place]) 2022  ? Depression   ? HISTORY OF  ? Fibroid   ? PONV (postoperative nausea and vomiting)   ? Smoker   ? ?Past Surgical History:  ?Procedure Laterality Date  ? CESAREAN SECTION    ? X 2  ? MASTECTOMY    ? MASTECTOMY WITH AXILLARY LYMPH NODE DISSECTION Right 08/04/2020  ? Procedure: RIGHT MASTECTOMY WITH RIGHT AXILLARY LYMPH NODE DISSECTION;  Surgeon: Rolm Bookbinder, MD;  Location: [place];  Service: General;  Laterality: Right;  ? PORTACATH PLACEMENT Left 03/17/2020  ? Procedure: INSERTION PORT-A-CATH;  Surgeon: Rolm Bookbinder, MD;  Location: [place];  Service: General;  Laterality: Left;  ? ?Patient Active Problem List  ? Diagnosis Date Noted  ? Fatigue 04/08/2021  ? S/P mastectomy, right 08/04/2020  ? Chemotherapy-induced peripheral neuropathy ([place]) 05/20/2020  ? Port-A-Cath in place 03/19/2020  ? Malignant neoplasm of upper-outer quadrant of right breast in female, estrogen receptor positive ([place]) 03/10/2020  ? Fibroid   ? Smoker   ? Depression   ? ? ?REFERRING DIAG:  G62.0,T45.1X5A (ICD-10-CM) - Chemotherapy-induced peripheral neuropathy (HCC)  ? ?THERAPY DIAG:  ?Muscle weakness (generalized) ? ?Difficulty in walking, not elsewhere classified ? ?Other disturbances of skin sensation ? ?Abnormal  posture ? ?Malignant neoplasm of upper-outer quadrant of right breast in female, estrogen receptor positive ([place]) ? ?PERTINENT HISTORY: PERTINENT HISTORY: R breast cancer s/p R mastectomy on 08/04/20 with 2 R positive axillary nodes, high grade DCIS. Has completed chemo as of Oct 2022 and radiation 11/04/20 ? ?PRECAUTIONS: at risk for lymphedema on the R side, balance deficits ? ?SUBJECTIVE: The neuropathy improves. Some days are worse than others. I have maintained the gains from therapy. I have been walking around in the grass around the house.  ? ?Are you having pain? Yes ?NPRS scale: 3/10 ?Pain location:L hand ?Pain orientation: L hand ?PAIN TYPE: stiffness ?Pain description: stiffness, feet are numb  ?Aggravating factors: cold, washing hands ?Relieving factors: wrapping in a blanket, using heating pad ? ?03/03/2021 ?COGNITION: ?           Overall cognitive status: Within functional limits for tasks assessed  ?  ?  ?OBSERVATIONS / OTHER ASSESSMENTS: pt sits with palms up to help decrease pain ?  ?SENSATION: ?           Light touch: Deficits barely able to detect and sometimes difficulty localizling area that is touched ?POSTURE: forward head, rounded shoulders ?  ?LE MMT:  ?R hip flexion: 4/5; 05/19/21- 5/5 ?L hip flexion: 3+/5; 05/19/21- 5/5 ?R hip abduction: 3/5; 04/19/21- 5/5; 05/19/21- 5/5 ?L hip abduction: 3/5; 04/19/21- 4/5; 05/19/21- 4+/5 ?R hip  extension: 2+/5; 04/07/21: 3+/5; 04/19/21- 4/5; 05/19/21- 4/5 ?L hip extension: 2+/5; 04/07/21: 3+/5; 04/19/21- 4/5; 05/19/21- 4/5 ?R knee extension: 5/5; 05/19/21- 5/5 ?L knee extension: 4/5; 05/19/21- 5/5 ?R knee flexion: 3/5; 05/19/21- 4+/5 ?L knee flexion: 3/5; 05/19/21- 4+/5 ?R ankle DF: 3/5; 05/19/21- 4+/5 ?L ankle DF: 3-/5; 05/19/21- 4/5 ?  ?GRIP STRENGTH: ?  ?R: 15 lbs avg for her age is 49.6 lbs,   03/29/2021  15,20,20= 18.3 avg; 04/19/21- 25 lbs; 05/19/21- 22 lbs ?L: 9 lbs avg for her age is 70 lbs          03/29/2021   5,7,11 avg 8 lbs; 04/19/21- 14 lbs; 05/19/21- 13 lbs ?   ?LYMPHEDEMA ASSESSMENTS:  ?  ?SURGERY TYPE/DATE: 08/04/20 - R mastectomy ?  ?NUMBER OF LYMPH NODES REMOVED: 2 nodes positive ?  ?CHEMOTHERAPY: Completed in 10 2022 ?  ?RADIATION:completed 11/2020 ?  ?  ?FUNCTIONAL TESTS:  ?30 seconds chair stand test; 6 reps which is poor for her age 70/22/2023  12 reps; 05/19/21- 13 reps ?  ?GAIT: ?Distance walked: 20 feet ?Assistive device utilized: None ?Level of assistance: Modified independence ?Comments: narrow base of support, adduction, foot flat, increased kyphosis, shortened step length ?05/19/21- 20 ft, no assistive device, independent, normal gait speed, normal base of support, normal step length, good foot clearance ?  ? ?  ?  ?TODAYS TREATMENT ?05/19/21-  ?Retested strength throughout bilat lower extremities, grip strength, 30 sec sit to stand, gait ? ?04/19/21 ?See assessment and HEP sections. Spent session condensing and creating a home exercise program for pt to continue at home to improve strength and balance and decrease pain from peripheral neuropathy.  ? ?04/12/2021 ?TherEx:  ?NuStep seat 7, UE 8 x 5 min lev 5, steps 328 ?-3 way hip on air ex x 10 reps with v/c to keep knee straight ?-Sidestepping and heel toe x 4 each on air ex balance beam with occasional hand touch on bars ?-Step ups and side step ups on cobble stone x 10 reps each with occasional finger touch on bar for support ?- On flat side of bosu: weight shifting forward and back x 10 and side to side x 10, mini squats x 10 ?-Gait in clinic practicing heel strike/toe off and arm swing x 300 ft, v/c to increase foot clearance on L with pt demonstrating improved dorsiflexion on L with v/c ?- standing on rocker board weight shifting forward back and side to side x 10 each then 30 sec holds with weight shifted backwards for calf stretch x 2 ?-Bridging x 10 ?-Ppt with SLR x 10  ?- Stepping over hurdles initially with step to gait pattern x 2 over 4 hurdles then stepping through hurdles x 2 over 4 hurdles with pt  requiring CGA ? ?Manual Therapy:  ?DF stretch Bilaterally x3 by PT in supine ?Gentle metatarsal mobilization with decreased mobility between metatarsals 1 and 2 on R foot, good mobility noted today in L foot, marked improvement in mobility noted after manual therapy ?  ?  ? ?04/07/2021 ?TherEx:  ?NuStep seat 7, UE 8 x 5 min lev 5, steps 307 ?Gait in clinic practicing heel strike/toe off and arm swing x 300 ft, v/c to increase foot clearance on L with pt demonstrating improved dorsiflexion on L with v/c ?Heel raises x 20 reps ?Bridging x 10 ?Standing on airex: 3 way hip with v/c to keep knee straight and to hip hike in order to clear foam x 10 each ?Standing in colored circles  with low cones tapping appropriate color with pt having increased difficulty on the L side and requiring CGA for balance ?Weaving in an out of small cones to work on changing directions with pt not having difficulty with this ?Ppt with SLR x 10  ?In prone: hip ext with knee straight and knee bent x 10 reps each ?Rocker board forward and back with 10 reps with 3 - 30 sec stretches for calf tightness ?Rocker board side to side x 10 with hand held assist ?Airex step and hold bilaterally x 10 bilaterally, light hand touch ?Manual Therapy:  ?DF stretch Bilaterally x5 by PT in supine ?Gentle metatarsal mobilization with decreased mobility between metatarsals 3 and 4 and 4 and 5 on the L foot and only between tarsal 1 and 2 on R foot, marked improvement in mobility noted after manual therapy ? ? ? ? ?04/05/2021 ?TherEx:  ?NuStep seat 7, UE 8 x 5 min lev 5 ?Gait in clinic practicing heel strike/toe off and arm swing x 300 ft, v/c to increase foot clearance on L ?Ppt with SLR x 10  ?Rocker board forward and back with 10 reps with 3 - 30 sec stretches for calf tightness ?Rocker board side to side x 10 with hand held assist ?Airex balance beam heel/toe x 4 reps then sidestepping x 4 reps with v/c to increase step length on L ?Airex step and hold bilaterally x  6 bilaterally, light hand touch ?Manual Therapy:  ?DF stretch Bilaterally x5 by PT in supine ?Gentle metatarsal mobilization with decreased mobility in all metatarsals on the L foot and only between tarsal 1 and 2 on R foot, m

## 2021-05-20 ENCOUNTER — Telehealth: Payer: Self-pay | Admitting: *Deleted

## 2021-05-20 NOTE — Telephone Encounter (Signed)
Received call from pt stating she is no longer interested in [facility] lab draws.  MD notified and verbalized understanding.

## 2021-05-24 ENCOUNTER — Ambulatory Visit (INDEPENDENT_AMBULATORY_CARE_PROVIDER_SITE_OTHER): Payer: Managed Care, Other (non HMO) | Admitting: Diagnostic Neuroimaging

## 2021-05-24 ENCOUNTER — Encounter: Payer: Self-pay | Admitting: Diagnostic Neuroimaging

## 2021-05-24 VITALS — BP 130/79 | HR 88 | Ht 66.0 in | Wt 184.0 lb

## 2021-05-24 DIAGNOSIS — G629 Polyneuropathy, unspecified: Secondary | ICD-10-CM | POA: Diagnosis not present

## 2021-05-24 NOTE — Patient Instructions (Signed)
CHEMOTHERAPY NEUROPATHY - "Neoadjuvant chemotherapy with TCH Perjeta 6 cycles followed by Kadcyla maintenance discontinued 11/11/2020 because of peripheral neuropathy"  - continue gabapentin '600mg'$  three times a day; may increase to 900-'1200mg'$  at bedtime dose for better pain control  - continue duloxetine '40mg'$  daily; may increase to '60mg'$  daily  - consider capsaicin cream, lidocaine patch / cream, alpha-lipoic acid '600mg'$  daily  - check neuropathy labs (has had elevated sugars and A1c in the past)

## 2021-05-24 NOTE — Progress Notes (Signed)
[facility]  PATIENT: Sabrina Woods DOB: 03/30/1955  REFERRING CLINICIAN: [name], MD HISTORY FROM: patient  REASON FOR VISIT: new consult    HISTORICAL  CHIEF COMPLAINT:  Chief Complaint  Patient presents with   New Patient (Initial Visit)    RM 7 with husband- here to discuss worsening neuropathy in bilateral hands and feet, sx worse on the left side. Pt reports sx started close to oct of 2022. Hx of breast cancer and chemotherapy. She is on gabapentin and duloxetine at this time for treatment.     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  Patient diagnosed with right breast mass, diagnosed as high-grade DCIS with necrosis and calcifications, positive lymph nodes.  She was treated with mastectomy and chemotherapy followed by radiation.  Chemotherapy (Aug-Oct 2022) had to be discontinued due to peripheral neuropathy symptoms.  Since that time symptoms have slightly improved.  She is on gabapentin 600 mg 3 times a day and Cymbalta 40 mg daily.  She reports pain symptoms as 5 out of 10, sometimes up to 7 out of 10 in the evening.    REVIEW OF SYSTEMS: Full 14 system review of systems performed and negative with exception of: as per HPI.  ALLERGIES: No Known Allergies  HOME MEDICATIONS: Outpatient Medications Prior to Visit  Medication Sig Dispense Refill   ALPRAZolam (XANAX) 0.25 MG tablet Take 1 tablet (0.25 mg total) by mouth 2 (two) times daily as needed for anxiety. 60 tablet 3   DULoxetine 40 MG CPEP Take 40 mg by mouth daily. 90 capsule 3   esomeprazole (NEXIUM) 20 MG capsule Take 20 mg by mouth daily.     gabapentin (NEURONTIN) 600 MG tablet Take 1 tablet (600 mg total) by mouth 3 (three) times daily. 90 tablet 3   loratadine (CLARITIN) 10 MG tablet Take 10 mg by mouth daily as needed for rhinitis.     Multiple Vitamin (MULTIVITAMIN) capsule Take 1 capsule by mouth daily.      DULoxetine (CYMBALTA) 30 MG capsule Take 30 mg by mouth daily.     methylPREDNISolone (MEDROL DOSEPAK) 4 MG TBPK tablet Take as directed on package 21 tablet 0   nicotine (NICODERM CQ) 14 mg/24hr patch Place 1 patch (14 mg total) onto the skin daily. 14 patch 0   valACYclovir (VALTREX) 1000 MG tablet Take 1 tablet (1,000 mg total) by mouth 2 (two) times daily. 14 tablet 0   No facility-administered medications prior to visit.    PAST MEDICAL HISTORY: Past Medical History:  Diagnosis Date   Acid reflux    Anxiety    Breast cancer ([place])    Breast cancer ([place]) 2022   Depression    HISTORY OF   Fibroid    PONV (postoperative nausea and vomiting)    Smoker     PAST SURGICAL HISTORY: Past Surgical History:  Procedure Laterality Date   CESAREAN SECTION     X 2   MASTECTOMY     MASTECTOMY WITH AXILLARY LYMPH NODE DISSECTION Right 08/04/2020   Procedure: RIGHT MASTECTOMY WITH RIGHT AXILLARY LYMPH NODE DISSECTION;  Surgeon: Rolm Bookbinder, MD;  Location: [place];  Service: General;  Laterality: Right;   PORTACATH PLACEMENT Left 03/17/2020   Procedure: INSERTION PORT-A-CATH;  Surgeon: Rolm Bookbinder, MD;  Location: [place];  Service: General;  Laterality: Left;    FAMILY HISTORY: Family History  Problem Relation Age of Onset   Breast cancer Mother  Diabetes Mother    Hypertension Mother    Hypertension Father    Hypertension Sister    Hypertension Brother     SOCIAL HISTORY: Social History   Socioeconomic History   Marital status: Married    Spouse name: Not on file   Number of children: Not on file   Years of education: Not on file   Highest education level: Not on file  Occupational History   Not on file  Tobacco Use   Smoking status: Every Day    Packs/day: 0.50    Types: Cigarettes   Smokeless tobacco: Never  Substance and Sexual Activity   Alcohol use: Yes    Comment: occasionally   Drug use: Not on file   Sexual activity: Yes     Birth control/protection: Post-menopausal  Other Topics Concern   Not on file  Social History Narrative   Right handed    Caffeine- 5-6 cups per day    Social Determinants of Health   Financial Resource Strain: Low Risk    Difficulty of Paying Living Expenses: Not hard at all  Food Insecurity: No Food Insecurity   Worried About Charity fundraiser in the Last Year: Never true   Ran Out of Food in the Last Year: Never true  Transportation Needs: No Transportation Needs   Lack of Transportation (Medical): No   Lack of Transportation (Non-Medical): No  Physical Activity: Not on file  Stress: Not on file  Social Connections: Not on file  Intimate Partner Violence: Not on file     PHYSICAL EXAM  GENERAL EXAM/CONSTITUTIONAL: Vitals:  Vitals:   05/24/21 1131  BP: 130/79  Pulse: 88  Weight: 184 lb (83.5 kg)  Height: '5\' 6"'$  (1.676 m)   Body mass index is 29.7 kg/m. Wt Readings from Last 3 Encounters:  05/24/21 184 lb (83.5 kg)  04/29/21 180 lb 1.6 oz (81.7 kg)  03/31/21 177 lb (80.3 kg)   Patient is in no distress; well developed, nourished and groomed; neck is supple  CARDIOVASCULAR: Examination of carotid arteries is normal; no carotid bruits Regular rate and rhythm, no murmurs Examination of peripheral vascular system by observation and palpation is normal  EYES: Ophthalmoscopic exam of optic discs and posterior segments is normal; no papilledema or hemorrhages No results found.  MUSCULOSKELETAL: Gait, strength, tone, movements noted in Neurologic exam below  NEUROLOGIC: MENTAL STATUS:      View : No data to display.         awake, alert, oriented to person, place and time recent and remote memory intact normal attention and concentration language fluent, comprehension intact, naming intact fund of knowledge appropriate  CRANIAL NERVE:  2nd - no papilledema on fundoscopic exam 2nd, 3rd, 4th, 6th - pupils equal and reactive to light, visual fields full  to confrontation, extraocular muscles intact, no nystagmus 5th - facial sensation symmetric 7th - facial strength symmetric 8th - hearing intact 9th - palate elevates symmetrically, uvula midline 11th - shoulder shrug symmetric 12th - tongue protrusion midline  MOTOR:  normal bulk and tone, full strength in the BUE, BLE; MILD WEAKNESS OF BILATERAL FOOT DORSIFLEXION (4/5; LEFT WORSE THAN RIGHT)  SENSORY:  normal and symmetric to light touch, temperature, vibration  COORDINATION:  finger-nose-finger, fine finger movements normal  REFLEXES:  deep tendon reflexes TRACE and symmetric  GAIT/STATION:  narrow based gait     DIAGNOSTIC DATA (LABS, IMAGING, TESTING) - I reviewed patient records, labs, notes, testing and imaging myself where available.  Lab Results  Component Value Date   WBC 7.9 11/11/2020   HGB 13.3 11/11/2020   HCT 39.1 11/11/2020   MCV 87.7 11/11/2020   PLT 291 11/11/2020      Component Value Date/Time   NA 132 (L) 11/11/2020 1004   K 3.0 (L) 11/11/2020 1004   CL 92 (L) 11/11/2020 1004   CO2 27 11/11/2020 1004   GLUCOSE 231 (H) 11/11/2020 1004   BUN 9 11/11/2020 1004   CREATININE 0.77 11/11/2020 1004   CALCIUM 9.7 11/11/2020 1004   PROT 7.5 11/11/2020 1004   ALBUMIN 3.8 11/11/2020 1004   AST 30 11/11/2020 1004   ALT 36 11/11/2020 1004   ALKPHOS 100 11/11/2020 1004   BILITOT 0.5 11/11/2020 1004   GFRNONAA >60 11/11/2020 1004   Lab Results  Component Value Date   CHOL 201 (H) 03/09/2011   HDL 40 03/09/2011   LDLCALC 134 (H) 03/09/2011   TRIG 136 03/09/2011   CHOLHDL 5.0 03/09/2011   Lab Results  Component Value Date   HGBA1C 6.2 (H) 03/09/2011   No results found for: VITAMINB12 No results found for: TSH     ASSESSMENT AND PLAN  66 y.o. year old female here with:   Dx:  1. Neuropathy      PLAN:  CHEMOTHERAPY NEUROPATHY - "Neoadjuvant chemotherapy with TCH Perjeta 6 cycles followed by Kadcyla maintenance discontinued  11/11/2020 because of peripheral neuropathy" - continue gabapentin '600mg'$  three times a day; may increase to 900-'1200mg'$  at bedtime dose for better pain control - continue duloxetine '40mg'$  daily; may increase to '60mg'$  daily - consider capsaicin cream, lidocaine patch / cream, alpha-lipoic acid '600mg'$  daily - check neuropathy labs (has had elevated sugars and A1c in the past)  Orders Placed This Encounter  Procedures   Hemoglobin A1c   Vitamin B12   TSH   Return for pending if symptoms worsen or fail to improve, pending test results.    Penni Bombard, MD 0/34/7425, 95:63 AM Certified in Neurology, Neurophysiology and Neuroimaging  [facility] [address] [place], [place] [address] [phone]

## 2021-05-25 LAB — TSH: TSH: 1.9 u[IU]/mL (ref 0.450–4.500)

## 2021-05-25 LAB — HEMOGLOBIN A1C
Est. average glucose Bld gHb Est-mCnc: 203 mg/dL
Hgb A1c MFr Bld: 8.7 % — ABNORMAL HIGH (ref 4.8–5.6)

## 2021-05-25 LAB — VITAMIN B12: Vitamin B-12: 710 pg/mL (ref 232–1245)

## 2021-05-27 ENCOUNTER — Inpatient Hospital Stay: Payer: Managed Care, Other (non HMO) | Attending: Hematology and Oncology

## 2021-05-27 DIAGNOSIS — G620 Drug-induced polyneuropathy: Secondary | ICD-10-CM | POA: Insufficient documentation

## 2021-05-27 DIAGNOSIS — C50411 Malignant neoplasm of upper-outer quadrant of right female breast: Secondary | ICD-10-CM | POA: Diagnosis present

## 2021-05-27 DIAGNOSIS — R5383 Other fatigue: Secondary | ICD-10-CM

## 2021-05-27 MED ORDER — CYANOCOBALAMIN 1000 MCG/ML IJ SOLN
1000.0000 ug | Freq: Once | INTRAMUSCULAR | Status: AC
  Administered 2021-05-27: 1000 ug via INTRAMUSCULAR
  Filled 2021-05-27: qty 1

## 2021-06-24 ENCOUNTER — Other Ambulatory Visit: Payer: Self-pay

## 2021-06-24 ENCOUNTER — Inpatient Hospital Stay: Payer: Managed Care, Other (non HMO) | Attending: Hematology and Oncology

## 2021-06-24 VITALS — BP 150/76 | HR 88 | Temp 98.5°F | Resp 18

## 2021-06-24 DIAGNOSIS — C50411 Malignant neoplasm of upper-outer quadrant of right female breast: Secondary | ICD-10-CM | POA: Diagnosis present

## 2021-06-24 DIAGNOSIS — R5383 Other fatigue: Secondary | ICD-10-CM

## 2021-06-24 DIAGNOSIS — G620 Drug-induced polyneuropathy: Secondary | ICD-10-CM | POA: Insufficient documentation

## 2021-06-24 MED ORDER — CYANOCOBALAMIN 1000 MCG/ML IJ SOLN
1000.0000 ug | Freq: Once | INTRAMUSCULAR | Status: AC
  Administered 2021-06-24: 1000 ug via INTRAMUSCULAR
  Filled 2021-06-24: qty 1

## 2021-06-24 NOTE — Patient Instructions (Signed)
Vitamin B12 Deficiency Vitamin B12 deficiency occurs when the body does not have enough of this important vitamin. The body needs this vitamin: To make red blood cells. To make DNA. This is the genetic material inside cells. To help the nerves work properly so they can carry messages from the brain to the body. Vitamin B12 deficiency can cause health problems, such as not having enough red blood cells in the blood (anemia). This can lead to nerve damage if untreated. What are the causes? This condition may be caused by: Not eating enough foods that contain vitamin B12. Not having enough stomach acid and digestive fluids to properly absorb vitamin B12 from the food that you eat. Having certain diseases that make it hard to absorb vitamin B12. These diseases include Crohn's disease, chronic pancreatitis, and cystic fibrosis. An autoimmune disorder in which the body does not make enough of a protein (intrinsic factor) within the stomach, resulting in not enough absorption of vitamin B12. Having a surgery in which part of the stomach or small intestine is removed. Taking certain medicines that make it hard for the body to absorb vitamin B12. These include: Heartburn medicines, such as antacids and proton pump inhibitors. Some medicines that are used to treat diabetes. What increases the risk? The following factors may make you more likely to develop a vitamin B12 deficiency: Being an older adult. Eating a vegetarian or vegan diet that does not include any foods that come from animals. Eating a poor diet while you are pregnant. Taking certain medicines. Having alcoholism. What are the signs or symptoms? In some cases, there are no symptoms of this condition. If the condition leads to anemia or nerve damage, various symptoms may occur, such as: Weakness. Tiredness (fatigue). Loss of appetite. Numbness or tingling in your hands and feet. Redness and burning of the tongue. Depression,  confusion, or memory problems. Trouble walking. If anemia is severe, symptoms can include: Shortness of breath. Dizziness. Rapid heart rate. How is this diagnosed? This condition may be diagnosed with a blood test to measure the level of vitamin B12 in your blood. You may also have other tests, including: A group of tests that measure certain characteristics of blood cells (complete blood count, CBC). A blood test to measure intrinsic factor. A procedure where a thin tube with a camera on the end is used to look into your stomach or intestines (endoscopy). Other tests may be needed to discover the cause of the deficiency. How is this treated? Treatment for this condition depends on the cause. This condition may be treated by: Changing your eating and drinking habits, such as: Eating more foods that contain vitamin B12. Drinking less alcohol or no alcohol. Getting vitamin B12 injections. Taking vitamin B12 supplements by mouth (orally). Your health care provider will tell you which dose is best for you. Follow these instructions at home: Eating and drinking  Include foods in your diet that come from animals and contain a lot of vitamin B12. These include: Meats and poultry. This includes beef, pork, chicken, turkey, and organ meats, such as liver. Seafood. This includes clams, rainbow trout, salmon, tuna, and haddock. Eggs. Dairy foods such as milk, yogurt, and cheese. Eat foods that have vitamin B12 added to them (are fortified), such as ready-to-eat breakfast cereals. Check the label on the package to see if a food is fortified. The items listed above may not be a complete list of foods and beverages you can eat and drink. Contact a dietitian for   more information. Alcohol use Do not drink alcohol if: Your health care provider tells you not to drink. You are pregnant, may be pregnant, or are planning to become pregnant. If you drink alcohol: Limit how much you have to: 0-1 drink a  day for women. 0-2 drinks a day for men. Know how much alcohol is in your drink. In the [place] one drink equals one 12 oz bottle of beer (355 mL), one 5 oz glass of wine (148 mL), or one 1 oz glass of hard liquor (44 mL). General instructions Get vitamin B12 injections if told to by your health care provider. Take supplements only as told by your health care provider. Follow the directions carefully. Keep all follow-up visits. This is important. Contact a health care provider if: Your symptoms come back. Your symptoms get worse or do not improve with treatment. Get help right away: You develop shortness of breath. You have a rapid heart rate. You have chest pain. You become dizzy or you faint. These symptoms may be an emergency. Get help right away. Call 911. Do not wait to see if the symptoms will go away. Do not drive yourself to the hospital. Summary Vitamin B12 deficiency occurs when the body does not have enough of this important vitamin. Common causes include not eating enough foods that contain vitamin B12, not being able to absorb vitamin B12 from the food that you eat, having a surgery in which part of the stomach or small intestine is removed, or taking certain medicines. Eat foods that have vitamin B12 in them. Treatment may include making a change in the way you eat and drink, getting vitamin B12 injections, or taking vitamin B12 supplements. This information is not intended to replace advice given to you by your health care provider. Make sure you discuss any questions you have with your health care provider. Document Revised: 08/14/2020 Document Reviewed: 08/14/2020 Elsevier Patient Education  2023 [employer]  

## 2021-07-12 ENCOUNTER — Telehealth: Payer: Self-pay | Admitting: *Deleted

## 2021-07-12 NOTE — Telephone Encounter (Signed)
Received call from pt with complaint of ongoing shingles symptoms over right mastectomy scar.  Pt states lesions will crust over during the say and continue to weep at night. Per MD pt needing to be seen by [facility] for further evaluation and treatment. Appt scheduled and pt verbalized understanding of appt date and time.

## 2021-07-13 ENCOUNTER — Inpatient Hospital Stay: Payer: Managed Care, Other (non HMO)

## 2021-07-13 ENCOUNTER — Other Ambulatory Visit: Payer: Self-pay

## 2021-07-13 ENCOUNTER — Telehealth: Payer: Self-pay

## 2021-07-13 ENCOUNTER — Inpatient Hospital Stay: Payer: Managed Care, Other (non HMO) | Attending: Hematology and Oncology | Admitting: Adult Health

## 2021-07-13 VITALS — BP 137/77 | HR 80 | Temp 98.1°F | Resp 183 | Ht 66.0 in | Wt 192.4 lb

## 2021-07-13 DIAGNOSIS — S21001S Unspecified open wound of right breast, sequela: Secondary | ICD-10-CM

## 2021-07-13 DIAGNOSIS — L989 Disorder of the skin and subcutaneous tissue, unspecified: Secondary | ICD-10-CM | POA: Insufficient documentation

## 2021-07-13 DIAGNOSIS — C50411 Malignant neoplasm of upper-outer quadrant of right female breast: Secondary | ICD-10-CM | POA: Diagnosis present

## 2021-07-13 DIAGNOSIS — Z79899 Other long term (current) drug therapy: Secondary | ICD-10-CM | POA: Insufficient documentation

## 2021-07-13 DIAGNOSIS — Z5112 Encounter for antineoplastic immunotherapy: Secondary | ICD-10-CM | POA: Diagnosis not present

## 2021-07-13 DIAGNOSIS — C792 Secondary malignant neoplasm of skin: Secondary | ICD-10-CM | POA: Insufficient documentation

## 2021-07-13 DIAGNOSIS — Z170 Estrogen receptor positive status [ER+]: Secondary | ICD-10-CM | POA: Insufficient documentation

## 2021-07-13 DIAGNOSIS — C773 Secondary and unspecified malignant neoplasm of axilla and upper limb lymph nodes: Secondary | ICD-10-CM | POA: Diagnosis not present

## 2021-07-13 NOTE — Assessment & Plan Note (Signed)
.  Is a 66 year old woman who is here today for follow-up of her history of stage IIIa HER2 positive right-sided breast cancer with concerns for possible recurrence.  I reviewed with daughter that I am concerned that her skin lesions are breast cancer recurrence.  I have sent a message to Dr. Donne Hazel and requested an urgent appointment so that he can evaluate and possibly biopsy one of the skin areas.  I also ordered CT chest to further evaluate.  I obtained a wound culture and viral culture of the area as well today.  We will expedite her appointment with Dr. Lindi Adie which is scheduled in August depending on the scheduling of the above tests.

## 2021-07-13 NOTE — Telephone Encounter (Signed)
Attempted to call pt regarding appt this morning with NP. Pt has not yet arrived. LVM for call back.

## 2021-07-13 NOTE — Progress Notes (Signed)
[facility] Follow up:    Sabrina Lose, MD [place] [id number]   DIAGNOSIS:  Cancer Staging  Malignant neoplasm of upper-outer quadrant of right breast in female, estrogen receptor positive ([place]) Staging form: Breast, AJCC 8th Edition - Clinical stage from 03/10/2020: Stage IIIA (cT3, cN1, cM0, G3, ER+, PR-, HER2+) - Signed by Sabrina Lose, MD on 03/10/2020 Stage prefix: Initial diagnosis - Pathologic stage from 08/04/2020: No Stage Recommended (ypTis (DCIS), pN2, cM0) - Signed by Gardenia Phlegm, NP on 02/05/2021 Stage prefix: Post-therapy   SUMMARY OF ONCOLOGIC HISTORY: Oncology History  Malignant neoplasm of upper-outer quadrant of right breast in female, estrogen receptor positive ([place])  03/10/2020 Initial Diagnosis   Palpable right breast mass x6 months.  Mammogram revealed left breast cysts (intraductal papilloma), 11 cm right breast mass and 2 masses 2.6 cm each in the right axilla: Biopsy grade 3 IDC ER 5% weak, PR 0%, Ki-67 25%, HER-2 3+ IHC positive   03/10/2020 Cancer Staging   Staging form: Breast, AJCC 8th Edition - Clinical stage from 03/10/2020: Stage IIIA (cT3, cN1, cM0, G3, ER+, PR-, HER2+) - Signed by Sabrina Lose, MD on 03/10/2020 Stage prefix: Initial diagnosis   03/19/2020 - 07/01/2020 Chemotherapy   TCH Perjeta   08/04/2020 Surgery   Right mastectomy (Dr. 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, high-grade DCIS with necrosis and calcifications, and 2 right axillary lymph nodes positive for metastatic carcinoma   08/04/2020 Cancer Staging   Staging form: Breast, AJCC 8th Edition - Pathologic stage from 08/04/2020: No Stage Recommended (ypTis (DCIS), pN2, cM0) - Signed by Gardenia Phlegm, NP on 02/05/2021 Stage prefix: Post-therapy   08/21/2020 - 10/23/2020 Chemotherapy   Discontinued due to peripheral neuropathy Patient is on Treatment Plan : BREAST  ADO-Trastuzumab Emtansine (Kadcyla) q21d     08/2020 -  Anti-estrogen oral therapy    Decided not to do antiestrogen therapy because her ER was 0% on final path.   10/06/2020 - 11/04/2020 Radiation Therapy   Adjuvant radiation     CURRENT THERAPY: observation  INTERVAL HISTORY: Sabrina Woods 66 y.o. female returns for follow up of her h/o breast cancer.  She was diagnosed with shingles on 04/2021.  She has noted the skin areas on her right chest wall are worsening and she is developing new areas under her arm.  She denies any purulence, and she says the area crusts over and then drains.  This has been worsening since the middle of May, 2023.   She notes burning and itching over the skin.    Patient Active Problem List   Diagnosis Date Noted   Fatigue 04/08/2021   S/P mastectomy, right 08/04/2020   Chemotherapy-induced peripheral neuropathy ([place]) 05/20/2020   Malignant neoplasm of upper-outer quadrant of right breast in female, estrogen receptor positive ([place]) 03/10/2020   Fibroid    Smoker    Depression     has No Known Allergies.  MEDICAL HISTORY: Past Medical History:  Diagnosis Date   Acid reflux    Anxiety    Breast cancer ([place])    Breast cancer ([place]) 2022   Depression    HISTORY OF   Fibroid    PONV (postoperative nausea and vomiting)    Smoker     SURGICAL HISTORY: Past Surgical History:  Procedure Laterality Date   CESAREAN SECTION     X 2   MASTECTOMY     MASTECTOMY WITH AXILLARY LYMPH  NODE DISSECTION Right 08/04/2020   Procedure: RIGHT MASTECTOMY WITH RIGHT AXILLARY LYMPH NODE DISSECTION;  Surgeon: Rolm Bookbinder, MD;  Location: [facility];  Service: General;  Laterality: Right;   PORTACATH PLACEMENT Left 03/17/2020   Procedure: INSERTION PORT-A-CATH;  Surgeon: Rolm Bookbinder, MD;  Location: [facility];  Service: General;  Laterality: Left;    SOCIAL HISTORY: Social History   Socioeconomic History   Marital status: Married     Spouse name: Not on file   Number of children: Not on file   Years of education: Not on file   Highest education level: Not on file  Occupational History   Not on file  Tobacco Use   Smoking status: Every Day    Packs/day: 0.50    Types: Cigarettes   Smokeless tobacco: Never  Substance and Sexual Activity   Alcohol use: Yes    Comment: occasionally   Drug use: Not on file   Sexual activity: Yes    Birth control/protection: Post-menopausal  Other Topics Concern   Not on file  Social History Narrative   Right handed    Caffeine- 5-6 cups per day    Social Determinants of Health   Financial Resource Strain: Low Risk  (09/01/2020)   Overall Financial Resource Strain (CARDIA)    Difficulty of Paying Living Expenses: Not hard at all  Food Insecurity: No Food Insecurity (09/01/2020)   Hunger Vital Sign    Worried About Running Out of Food in the Last Year: Never true    Ran Out of Food in the Last Year: Never true  Transportation Needs: No Transportation Needs (09/01/2020)   PRAPARE - Hydrologist (Medical): No    Lack of Transportation (Non-Medical): No  Physical Activity: Not on file  Stress: Not on file  Social Connections: Not on file  Intimate Partner Violence: Not on file    FAMILY HISTORY: Family History  Problem Relation Age of Onset   Breast cancer Mother    Diabetes Mother    Hypertension Mother    Hypertension Father    Hypertension Sister    Hypertension Brother     Review of Systems  Constitutional:  Negative for appetite change, chills, fatigue, fever and unexpected weight change.  HENT:   Negative for hearing loss, lump/mass and trouble swallowing.   Eyes:  Negative for eye problems and icterus.  Respiratory:  Negative for chest tightness, cough and shortness of breath.   Cardiovascular:  Negative for chest pain, leg swelling and palpitations.  Gastrointestinal:  Negative for abdominal distention, abdominal pain,  constipation, diarrhea, nausea and vomiting.  Endocrine: Negative for hot flashes.  Genitourinary:  Negative for difficulty urinating.   Musculoskeletal:  Negative for arthralgias.  Skin:  Positive for itching. Negative for rash.  Neurological:  Negative for dizziness, extremity weakness, headaches and numbness.  Hematological:  Negative for adenopathy. Does not bruise/bleed easily.  Psychiatric/Behavioral:  Negative for depression. The patient is not nervous/anxious.       PHYSICAL EXAMINATION  ECOG PERFORMANCE STATUS: 1 - Symptomatic but completely ambulatory  Vitals:   07/13/21 0959  BP: 137/77  Pulse: 80  Resp: (!) 183  Temp: 98.1 F (36.7 C)  SpO2: 100%    Physical Exam Constitutional:      General: She is not in acute distress.    Appearance: Normal appearance. She is not toxic-appearing.  HENT:     Head: Normocephalic and atraumatic.  Eyes:     General: No scleral  icterus. Cardiovascular:     Rate and Rhythm: Normal rate and regular rhythm.     Pulses: Normal pulses.     Heart sounds: Normal heart sounds.  Pulmonary:     Effort: Pulmonary effort is normal.     Breath sounds: Normal breath sounds.  Chest:     Comments: See picture below  Abdominal:     General: Abdomen is flat. Bowel sounds are normal. There is no distension.     Palpations: Abdomen is soft.     Tenderness: There is no abdominal tenderness.  Musculoskeletal:        General: No swelling.     Cervical back: Neck supple.  Lymphadenopathy:     Cervical: No cervical adenopathy.  Skin:    General: Skin is warm and dry.     Findings: No rash.  Neurological:     General: No focal deficit present.     Mental Status: She is alert.  Psychiatric:        Mood and Affect: Mood normal.        Behavior: Behavior normal.    Right chest wall 07/13/2021:      LABORATORY DATA:  CBC    Component Value Date/Time   WBC 7.9 11/11/2020 1004   WBC 13.0 (H) 08/05/2020 0038   RBC 4.46 11/11/2020  1004   HGB 13.3 11/11/2020 1004   HCT 39.1 11/11/2020 1004   PLT 291 11/11/2020 1004   MCV 87.7 11/11/2020 1004   MCH 29.8 11/11/2020 1004   MCHC 34.0 11/11/2020 1004   RDW 13.7 11/11/2020 1004   LYMPHSABS 1.1 11/11/2020 1004   MONOABS 0.8 11/11/2020 1004   EOSABS 0.1 11/11/2020 1004   BASOSABS 0.0 11/11/2020 1004    CMP     Component Value Date/Time   NA 132 (L) 11/11/2020 1004   K 3.0 (L) 11/11/2020 1004   CL 92 (L) 11/11/2020 1004   CO2 27 11/11/2020 1004   GLUCOSE 231 (H) 11/11/2020 1004   BUN 9 11/11/2020 1004   CREATININE 0.77 11/11/2020 1004   CALCIUM 9.7 11/11/2020 1004   PROT 7.5 11/11/2020 1004   ALBUMIN 3.8 11/11/2020 1004   AST 30 11/11/2020 1004   ALT 36 11/11/2020 1004   ALKPHOS 100 11/11/2020 1004   BILITOT 0.5 11/11/2020 1004   GFRNONAA >60 11/11/2020 1004        ASSESSMENT and THERAPY PLAN:   Malignant neoplasm of upper-outer quadrant of right breast in female, estrogen receptor positive ([place]) .Is a 66 year old woman who is here today for follow-up of her history of stage IIIa HER2 positive right-sided breast cancer with concerns for possible recurrence.  I reviewed with daughter that I am concerned that her skin lesions are breast cancer recurrence.  I have sent a message to Dr. Donne Hazel and requested an urgent appointment so that he can evaluate and possibly biopsy one of the skin areas.  I also ordered CT chest to further evaluate.  I obtained a wound culture and viral culture of the area as well today.  We will expedite her appointment with Dr. Lindi Adie which is scheduled in August depending on the scheduling of the above tests.   All questions were answered. The patient knows to call the clinic with any problems, questions or concerns. We can certainly see the patient much sooner if necessary.  Total encounter time:20 minutes*in face-to-face visit time, chart review, lab review, care coordination, order entry, and documentation of the encounter  time.    Mendel Ryder  Delice Bison, NP 07/13/21 11:23 AM Medical Oncology and Hematology [facility] [place], [place] [address] Tel. [phone]    Fax. [phone]  *Total Encounter Time as defined by the Centers for Medicare and Medicaid Services includes, in addition to the face-to-face time of a patient visit (documented in the note above) non-face-to-face time: obtaining and reviewing outside history, ordering and reviewing medications, tests or procedures, care coordination (communications with other health care professionals or caregivers) and documentation in the medical record.

## 2021-07-14 ENCOUNTER — Other Ambulatory Visit: Payer: Self-pay | Admitting: General Surgery

## 2021-07-15 LAB — AEROBIC CULTURE W GRAM STAIN (SUPERFICIAL SPECIMEN)

## 2021-07-16 ENCOUNTER — Telehealth: Payer: Self-pay

## 2021-07-16 NOTE — Telephone Encounter (Signed)
Return call to pt, informed her that punch biopsy results are still pending and wound culture does not show an infection but rather bacteria naturally found on the skin.  Pt states the wound is scabbing and catching on her clothing and wants to know if she can put gauze on it - I instructed her to keep the area clean with soap and water and she can cover with gauze, keep clean and dry by changing the dressing everyday.  Pt verbalized understanding and thanks

## 2021-07-19 ENCOUNTER — Telehealth: Payer: Self-pay

## 2021-07-19 NOTE — Telephone Encounter (Signed)
Called [facility] to f/u on punch bx results. [facility] states there are still no results and this may take 5 business days.   Pt is aware and verbalized thanks. She is aware we will continue to f/u on this. She knows to call with any further questions/concerns.

## 2021-07-20 ENCOUNTER — Telehealth: Payer: Self-pay

## 2021-07-20 NOTE — Telephone Encounter (Signed)
Pathology results from [facility] received, FISH sent off 7/17 per [place], pathology assistant. She states FISH results are typically 48 hr. Pt has been scheduled accordingly for 7/21 at 1215 with Dr Lindi Adie to review results. Pt is not aware of path results, but knows further testing needed to be conducted. She was tearful as she is anxious for bad news. She knows to call with any further concerns.

## 2021-07-20 NOTE — Telephone Encounter (Signed)
FISH resulted. Pt agreed to come in tomorrow 7/19 at 3 PM for injection then 345 MD visit. She is aware and verbalized thanks.

## 2021-07-21 ENCOUNTER — Inpatient Hospital Stay (HOSPITAL_BASED_OUTPATIENT_CLINIC_OR_DEPARTMENT_OTHER): Payer: Managed Care, Other (non HMO) | Admitting: Hematology and Oncology

## 2021-07-21 ENCOUNTER — Other Ambulatory Visit: Payer: Self-pay

## 2021-07-21 ENCOUNTER — Inpatient Hospital Stay: Payer: Managed Care, Other (non HMO)

## 2021-07-21 VITALS — BP 155/69 | HR 92 | Temp 97.2°F | Resp 18 | Ht 66.0 in | Wt 191.6 lb

## 2021-07-21 VITALS — BP 159/86 | HR 94 | Temp 97.3°F | Resp 18

## 2021-07-21 DIAGNOSIS — C50411 Malignant neoplasm of upper-outer quadrant of right female breast: Secondary | ICD-10-CM

## 2021-07-21 DIAGNOSIS — Z170 Estrogen receptor positive status [ER+]: Secondary | ICD-10-CM | POA: Diagnosis not present

## 2021-07-21 DIAGNOSIS — Z5112 Encounter for antineoplastic immunotherapy: Secondary | ICD-10-CM | POA: Diagnosis not present

## 2021-07-21 DIAGNOSIS — R5383 Other fatigue: Secondary | ICD-10-CM

## 2021-07-21 LAB — VIRUS CULTURE

## 2021-07-21 MED ORDER — OXYCODONE-ACETAMINOPHEN 5-325 MG PO TABS: 1.0000 | ORAL_TABLET | Freq: Three times a day (TID) | ORAL | 0 refills | Status: DC | PRN

## 2021-07-21 MED ORDER — CYANOCOBALAMIN 1000 MCG/ML IJ SOLN
1000.0000 ug | Freq: Once | INTRAMUSCULAR | Status: AC
  Administered 2021-07-21: 1000 ug via INTRAMUSCULAR
  Filled 2021-07-21: qty 1

## 2021-07-21 NOTE — Patient Instructions (Signed)
Vitamin B12 Injection What is this medication? Vitamin B12 (VAHY tuh min B12) prevents and treats low vitamin B12 levels in your body. It is used in people who do not get enough vitamin B12 from their diet or when their digestive tract does not absorb enough. Vitamin B12 plays an important role in maintaining the health of your nervous system and red blood cells. This medicine may be used for other purposes; ask your health care provider or pharmacist if you have questions. COMMON BRAND NAME(S): B-12 Compliance Kit, B-12 Injection Kit, Cyomin, Dodex, LA-12, Nutri-Twelve, Physicians EZ Use B-12, Primabalt What should I tell my care team before I take this medication? They need to know if you have any of these conditions: Kidney disease Leber's disease Megaloblastic anemia An unusual or allergic reaction to cyanocobalamin, cobalt, other medications, foods, dyes, or preservatives Pregnant or trying to get pregnant Breast-feeding How should I use this medication? This medication is injected into a muscle or deeply under the skin. It is usually given in a clinic or care team's office. However, your care team may teach you how to inject yourself. Follow all instructions. Talk to your care team about the use of this medication in children. Special care may be needed. Overdosage: If you think you have taken too much of this medicine contact a poison control center or emergency room at once. NOTE: This medicine is only for you. Do not share this medicine with others. What if I miss a dose? If you are given your dose at a clinic or care team's office, call to reschedule your appointment. If you give your own injections, and you miss a dose, take it as soon as you can. If it is almost time for your next dose, take only that dose. Do not take double or extra doses. What may interact with this medication? Alcohol Colchicine This list may not describe all possible interactions. Give your health care  provider a list of all the medicines, herbs, non-prescription drugs, or dietary supplements you use. Also tell them if you smoke, drink alcohol, or use illegal drugs. Some items may interact with your medicine. What should I watch for while using this medication? Visit your care team regularly. You may need blood work done while you are taking this medication. You may need to follow a special diet. Talk to your care team. Limit your alcohol intake and avoid smoking to get the best benefit. What side effects may I notice from receiving this medication? Side effects that you should report to your care team as soon as possible: Allergic reactions--skin rash, itching, hives, swelling of the face, lips, tongue, or throat Swelling of the ankles, hands, or feet Trouble breathing Side effects that usually do not require medical attention (report to your care team if they continue or are bothersome): Diarrhea This list may not describe all possible side effects. Call your doctor for medical advice about side effects. You may report side effects to FDA at 1-800-FDA-1088. Where should I keep my medication? Keep out of the reach of children. Store at room temperature between 15 and 30 degrees C (59 and 85 degrees F). Protect from light. Throw away any unused medication after the expiration date. NOTE: This sheet is a summary. It may not cover all possible information. If you have questions about this medicine, talk to your doctor, pharmacist, or health care provider.  2023 Elsevier/Gold Standard (2020-09-01 00:00:00)

## 2021-07-21 NOTE — Assessment & Plan Note (Addendum)
08/04/20:Right mastectomy (Dr. 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 Neoadjuvant chemotherapy with TCH Perjeta 6 cycles followed by Kadcyla maintenance discontinued 11/11/2020 because of peripheral neuropathy 2. Followed bymastectomywith sentinel lymph node study8/2/22 3. Followed by adjuvant radiation therapy9/16/2022- 11/04/2020 4.Decided not to do antiestrogen therapy because her ER was 0% on final path. 5.Consideration for neratinib ----------------------------------------------------------------------------------------------------------------- Right mastectomy area skin changes: Initially treated for shingles but since lesions did not improve skin biopsy was obtained on 07/14/2021: Infiltrating carcinoma consistent with breast origin CK7 and GA TA 3 positive, ER 0%, PR 0%, HER2 2+ by IHC, FISH negative ratio 1.56, copy #2.1  Discussion: This unfortunately represents recurrence of breast cancer and metastatic breast cancer.    Plan: 1.  CT CAP and bone scan 2. recommend systemic treatment with Enhertu 3.  Port placement, echocardiogram We may have to treat her peripherally without the port for the first treatment next week.  Return to clinic to start Enhertu.

## 2021-07-21 NOTE — Progress Notes (Signed)
Patient Care Team: [name], MD as PCP - General (Hematology and Oncology) [name], MD as Consulting Physician (General Surgery) Jim Puffer, MD as Consulting Physician (Radiation Oncology)  DIAGNOSIS:  Encounter Diagnosis  Name Primary?   Malignant neoplasm of upper-outer quadrant of right breast in female, estrogen receptor positive (HCC) Yes    SUMMARY OF ONCOLOGIC HISTORY: Oncology History  Malignant neoplasm of upper-outer quadrant of right breast in female, estrogen receptor positive (HCC)  03/10/2020 Initial Diagnosis   Palpable right breast mass x6 months.  Mammogram revealed left breast cysts (intraductal papilloma), 11 cm right breast mass and 2 masses 2.6 cm each in the right axilla: Biopsy grade 3 IDC ER 5% weak, PR 0%, Ki-67 25%, HER-2 3+ IHC positive   03/10/2020 Cancer Staging   Staging form: Breast, AJCC 8th Edition - Clinical stage from 03/10/2020: Stage IIIA (cT3, cN1, cM0, G3, ER+, PR-, HER2+) - Signed by [name], MD on 03/10/2020 Stage prefix: Initial diagnosis   03/19/2020 - 07/01/2020 Chemotherapy   TCH Perjeta   08/04/2020 Surgery   Right mastectomy (Dr. 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, high-grade DCIS with necrosis and calcifications, and 2 right axillary lymph nodes positive for metastatic carcinoma   08/04/2020 Cancer Staging   Staging form: Breast, AJCC 8th Edition - Pathologic stage from 08/04/2020: No Stage Recommended (ypTis (DCIS), pN2, cM0) - Signed by Loa Socks, NP on 02/05/2021 Stage prefix: Post-therapy   08/21/2020 - 10/23/2020 Chemotherapy   Discontinued due to peripheral neuropathy Patient is on Treatment Plan : BREAST ADO-Trastuzumab Emtansine (Kadcyla) q21d     08/2020 -  Anti-estrogen oral therapy    Decided not to do antiestrogen therapy because her ER was 0% on final path.   10/06/2020 - 11/04/2020 Radiation Therapy   Adjuvant radiation     Relapse/Recurrence   Right mastectomy area skin changes: Initially treated for shingles but since lesions did not improve skin biopsy was obtained on 07/14/2021: Infiltrating carcinoma consistent with breast origin CK7 and GA TA 3 positive, ER 0%, PR 0%, HER2 2+ by IHC, FISH negative ratio 1.56, copy #2.1     CHIEF COMPLIANT: Follow-up to review path and fish  INTERVAL HISTORY: Sabrina Woods is a 66 y.o. female with above-mentioned history of right breast cancer. She presents to the clinic today for a follow-up to review path and fish.    ALLERGIES:  has No Known Allergies.  MEDICATIONS:  Current Outpatient Medications  Medication Sig Dispense Refill   oxyCODONE-acetaminophen (PERCOCET/ROXICET) 5-325 MG tablet Take 1 tablet by mouth every 8 (eight) hours as needed for severe pain. 60 tablet 0   ALPRAZolam (XANAX) 0.25 MG tablet Take 1 tablet (0.25 mg total) by mouth 2 (two) times daily as needed for anxiety. 60 tablet 3   DULoxetine 40 MG CPEP Take 40 mg by mouth daily. 90 capsule 3   esomeprazole (NEXIUM) 20 MG capsule Take 20 mg by mouth daily.     gabapentin (NEURONTIN) 600 MG tablet Take 1 tablet (600 mg total) by mouth 3 (three) times daily. 90 tablet 3   loratadine (CLARITIN) 10 MG tablet Take 10 mg by mouth daily as needed for rhinitis.     Multiple Vitamin (MULTIVITAMIN) capsule Take 1 capsule by mouth daily.     No current facility-administered medications for this visit.    PHYSICAL EXAMINATION: ECOG PERFORMANCE STATUS: 1 - Symptomatic but completely ambulatory  Vitals:   07/21/21 1532  BP: (!) 155/69  Pulse: 92  Resp: 18  Temp: (!) 97.2 F (36.2 C)  SpO2: 99%   Filed Weights   07/21/21 1532  Weight: 191 lb 9.6 oz (86.9 kg)    BREAST: Extensive skin changes related to tumor infiltration on the right side of the chest (exam performed in the presence of a chaperone)  LABORATORY DATA:  I have reviewed the data as listed    Latest Ref Rng & Units 11/11/2020    10:04 AM 10/22/2020   10:31 AM 10/02/2020    9:33 AM  CMP  Glucose 70 - 99 mg/dL 231  212  238   BUN 8 - 23 mg/dL $Remove'9  8  9   'MqdXrVr$ Creatinine 0.44 - 1.00 mg/dL 0.77  0.79  0.83   Sodium 135 - 145 mmol/L 132  134  139   Potassium 3.5 - 5.1 mmol/L 3.0  3.5  3.8   Chloride 98 - 111 mmol/L 92  98  105   CO2 22 - 32 mmol/L $RemoveB'27  25  24   'TYUCqNus$ Calcium 8.9 - 10.3 mg/dL 9.7  10.1  9.9   Total Protein 6.5 - 8.1 g/dL 7.5  7.6  7.5   Total Bilirubin 0.3 - 1.2 mg/dL 0.5  0.4  0.3   Alkaline Phos 38 - 126 U/L 100  101  106   AST 15 - 41 U/L $Remo'30  28  25   'rwNTd$ ALT 0 - 44 U/L 36  28  21     Lab Results  Component Value Date   WBC 7.9 11/11/2020   HGB 13.3 11/11/2020   HCT 39.1 11/11/2020   MCV 87.7 11/11/2020   PLT 291 11/11/2020   NEUTROABS 5.9 11/11/2020    ASSESSMENT & PLAN:  Malignant neoplasm of upper-outer quadrant of right breast in female, estrogen receptor positive ([facility]) 08/04/20: Right mastectomy (Dr. 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 Neoadjuvant chemotherapy with TCH Perjeta 6 cycles followed by Kadcyla maintenance discontinued 11/11/2020 because of peripheral neuropathy 2. Followed by mastectomy with sentinel lymph node study 08/04/20 3. Followed by adjuvant radiation therapy 09/18/2020- 11/04/2020 4.  Decided not to do antiestrogen therapy because her ER was 0% on final path. 5.  Consideration for neratinib ----------------------------------------------------------------------------------------------------------------- Right mastectomy area skin changes: Initially treated for shingles but since lesions did not improve skin biopsy was obtained on 07/14/2021: Infiltrating carcinoma consistent with breast origin CK7 and GA TA 3 positive, ER 0%, PR 0%, HER2 2+ by IHC, FISH negative ratio 1.56, copy #2.1  Discussion:  This unfortunately represents recurrence of breast cancer and metastatic breast cancer.    Plan: 1.  CT CAP and bone scan 2. recommend systemic treatment with Enhertu 3.  Port placement, echocardiogram We may have to treat her peripherally without the port for the first treatment next week.  Return to clinic to start Enhertu. Pain related to the tumors in the chest wall: Sent a prescription for oxycodone.  She is currently taking gabapentin and Cymbalta.   Orders Placed This Encounter  Procedures   CT CHEST ABDOMEN PELVIS W CONTRAST    Standing Status:   Future    Standing Expiration Date:   07/22/2022    Order Specific Question:   Preferred imaging location?    Answer:   [facility]    Order Specific Question:   Is Oral Contrast  requested for this exam?    Answer:   Yes, Per Radiology protocol   NM Bone Scan Whole Body    Standing Status:   Future    Standing Expiration Date:   07/22/2022    Order Specific Question:   If indicated for the ordered procedure, I authorize the administration of a radiopharmaceutical per Radiology protocol    Answer:   Yes    Order Specific Question:   Preferred imaging location?    Answer:   [facility]   ECHOCARDIOGRAM COMPLETE    Standing Status:   Future    Standing Expiration Date:   07/22/2022    Order Specific Question:   Where should this test be performed    Answer:   [facility]    Order Specific Question:   Perflutren DEFINITY (image enhancing agent) should be administered unless hypersensitivity or allergy exist    Answer:   Administer Perflutren    Order Specific Question:   Reason for exam-Echo    Answer:   Chemo  Z09   The patient has a good understanding of the overall plan. she agrees with it. she will call with any problems that may develop before the next visit here. Total time spent: 30 mins including face to face time and time spent for planning, charting and co-ordination of care   Harriette Ohara,  MD 07/21/21    I Gardiner Coins am scribing for Dr. Lindi Adie  I have reviewed the above documentation for accuracy and completeness, and I agree with the above.

## 2021-07-22 ENCOUNTER — Other Ambulatory Visit: Payer: Self-pay | Admitting: General Surgery

## 2021-07-22 ENCOUNTER — Other Ambulatory Visit: Payer: Self-pay | Admitting: *Deleted

## 2021-07-22 ENCOUNTER — Inpatient Hospital Stay: Payer: Managed Care, Other (non HMO)

## 2021-07-22 ENCOUNTER — Ambulatory Visit (HOSPITAL_BASED_OUTPATIENT_CLINIC_OR_DEPARTMENT_OTHER)
Admission: RE | Admit: 2021-07-22 | Discharge: 2021-07-22 | Disposition: A | Discharge status: Home / Self Care | Payer: Managed Care, Other (non HMO) | Source: Ambulatory Visit | Attending: Hematology and Oncology | Admitting: Hematology and Oncology

## 2021-07-22 DIAGNOSIS — F17200 Nicotine dependence, unspecified, uncomplicated: Secondary | ICD-10-CM | POA: Insufficient documentation

## 2021-07-22 DIAGNOSIS — Z79899 Other long term (current) drug therapy: Secondary | ICD-10-CM | POA: Diagnosis not present

## 2021-07-22 DIAGNOSIS — C50919 Malignant neoplasm of unspecified site of unspecified female breast: Secondary | ICD-10-CM | POA: Insufficient documentation

## 2021-07-22 DIAGNOSIS — I5030 Unspecified diastolic (congestive) heart failure: Secondary | ICD-10-CM | POA: Diagnosis not present

## 2021-07-22 DIAGNOSIS — Z0189 Encounter for other specified special examinations: Secondary | ICD-10-CM

## 2021-07-22 DIAGNOSIS — Z5181 Encounter for therapeutic drug level monitoring: Secondary | ICD-10-CM

## 2021-07-22 LAB — ECHOCARDIOGRAM COMPLETE
AR max vel: 2.8 cm2
AV Area VTI: 2.43 cm2
AV Area mean vel: 2.19 cm2
AV Mean grad: 6 mmHg
AV Peak grad: 10.5 mmHg
Ao pk vel: 1.62 m/s
Area-P 1/2: 3.2 cm2
S' Lateral: 2.3 cm

## 2021-07-22 NOTE — Progress Notes (Signed)
  Echocardiogram 2D Echocardiogram has been performed.  Sabrina Woods F 07/22/2021, 12:25 PM

## 2021-07-23 ENCOUNTER — Ambulatory Visit: Payer: Managed Care, Other (non HMO) | Admitting: Hematology and Oncology

## 2021-07-23 ENCOUNTER — Inpatient Hospital Stay: Payer: Managed Care, Other (non HMO)

## 2021-07-23 ENCOUNTER — Other Ambulatory Visit: Payer: Self-pay | Admitting: Hematology and Oncology

## 2021-07-23 DIAGNOSIS — Z170 Estrogen receptor positive status [ER+]: Secondary | ICD-10-CM

## 2021-07-23 MED ORDER — LIDOCAINE-PRILOCAINE 2.5-2.5 % EX CREA: TOPICAL_CREAM | CUTANEOUS | 3 refills | Status: DC

## 2021-07-23 MED ORDER — ONDANSETRON HCL 8 MG PO TABS: 8.0000 mg | ORAL_TABLET | Freq: Two times a day (BID) | ORAL | 1 refills | Status: DC | PRN

## 2021-07-23 MED ORDER — PROCHLORPERAZINE MALEATE 10 MG PO TABS: 10.0000 mg | ORAL_TABLET | Freq: Four times a day (QID) | ORAL | 1 refills | Status: DC | PRN

## 2021-07-23 NOTE — Progress Notes (Signed)
DISCONTINUE ON PATHWAY REGIMEN - Breast     A cycle is every 21 days:     Pertuzumab      Pertuzumab      Trastuzumab-xxxx      Trastuzumab-xxxx      Carboplatin      Docetaxel   **Always confirm dose/schedule in your pharmacy ordering system**  REASON: Other Reason PRIOR TREATMENT: [id number]: Docetaxel + Carboplatin + Trastuzumab IV + Pertuzumab IV (TCHP IV) q21 Days x 6 Cycles TREATMENT RESPONSE: Unable to Evaluate  START ON PATHWAY REGIMEN - Breast     A cycle is every 21 days:     Fam-trastuzumab deruxtecan-nxki   **Always confirm dose/schedule in your pharmacy ordering system**  Patient Characteristics: Distant Metastases or Locoregional Recurrent Disease - Unresected or Locally Advanced Unresectable Disease Progressing after Neoadjuvant and Local Therapies, ER Positive, Chemotherapy, HER2 Low, Second Line Therapeutic Status: Distant Metastases HER2 Status: Low ER Status: Positive (+) PR Status: Negative (-) Therapy Approach Indicated: Standard Chemotherapy/Endocrine Therapy Line of Therapy: Second Line Intent of Therapy: Non-Curative / Palliative Intent, Discussed with Patient

## 2021-07-26 ENCOUNTER — Other Ambulatory Visit: Payer: Self-pay | Admitting: Hematology and Oncology

## 2021-07-26 ENCOUNTER — Other Ambulatory Visit: Payer: Self-pay

## 2021-07-28 ENCOUNTER — Other Ambulatory Visit: Payer: Self-pay

## 2021-07-28 ENCOUNTER — Inpatient Hospital Stay: Payer: Managed Care, Other (non HMO)

## 2021-07-28 VITALS — BP 160/89 | HR 77 | Temp 98.0°F | Resp 18 | Wt 196.5 lb

## 2021-07-28 DIAGNOSIS — Z170 Estrogen receptor positive status [ER+]: Secondary | ICD-10-CM

## 2021-07-28 DIAGNOSIS — Z5112 Encounter for antineoplastic immunotherapy: Secondary | ICD-10-CM | POA: Diagnosis not present

## 2021-07-28 LAB — CBC WITH DIFFERENTIAL/PLATELET
Abs Immature Granulocytes: 0.02 K/uL (ref 0.00–0.07)
Basophils Absolute: 0.1 K/uL (ref 0.0–0.1)
Basophils Relative: 1 %
Eosinophils Absolute: 0.1 K/uL (ref 0.0–0.5)
Eosinophils Relative: 2 %
HCT: 38.4 % (ref 36.0–46.0)
Hemoglobin: 13.4 g/dL (ref 12.0–15.0)
Immature Granulocytes: 0 %
Lymphocytes Relative: 19 %
Lymphs Abs: 1.5 K/uL (ref 0.7–4.0)
MCH: 31.6 pg (ref 26.0–34.0)
MCHC: 34.9 g/dL (ref 30.0–36.0)
MCV: 90.6 fL (ref 80.0–100.0)
Monocytes Absolute: 0.6 K/uL (ref 0.1–1.0)
Monocytes Relative: 8 %
Neutro Abs: 5.6 K/uL (ref 1.7–7.7)
Neutrophils Relative %: 70 %
Platelets: 283 K/uL (ref 150–400)
RBC: 4.24 MIL/uL (ref 3.87–5.11)
RDW: 12.4 % (ref 11.5–15.5)
WBC: 8 K/uL (ref 4.0–10.5)
nRBC: 0 % (ref 0.0–0.2)

## 2021-07-28 LAB — COMPREHENSIVE METABOLIC PANEL WITH GFR
ALT: 13 U/L (ref 0–44)
AST: 13 U/L — ABNORMAL LOW (ref 15–41)
Albumin: 4.2 g/dL (ref 3.5–5.0)
Alkaline Phosphatase: 92 U/L (ref 38–126)
Anion gap: 7 (ref 5–15)
BUN: 9 mg/dL (ref 8–23)
CO2: 25 mmol/L (ref 22–32)
Calcium: 9.3 mg/dL (ref 8.9–10.3)
Chloride: 98 mmol/L (ref 98–111)
Creatinine, Ser: 0.72 mg/dL (ref 0.44–1.00)
GFR, Estimated: >60 mL/min
Glucose, Bld: 219 mg/dL — ABNORMAL HIGH (ref 70–99)
Potassium: 4 mmol/L (ref 3.5–5.1)
Sodium: 130 mmol/L — ABNORMAL LOW (ref 135–145)
Total Bilirubin: 0.4 mg/dL (ref 0.3–1.2)
Total Protein: 7.2 g/dL (ref 6.5–8.1)

## 2021-07-28 MED ORDER — ACETAMINOPHEN 325 MG PO TABS
650.0000 mg | ORAL_TABLET | Freq: Once | ORAL | Status: AC
  Administered 2021-07-28: 650 mg via ORAL
  Filled 2021-07-28: qty 2

## 2021-07-28 MED ORDER — PALONOSETRON HCL INJECTION 0.25 MG/5ML
0.2500 mg | Freq: Once | INTRAVENOUS | Status: AC
  Administered 2021-07-28: 0.25 mg via INTRAVENOUS
  Filled 2021-07-28: qty 5

## 2021-07-28 MED ORDER — SODIUM CHLORIDE 0.9 % IV SOLN
10.0000 mg | Freq: Once | INTRAVENOUS | Status: AC
  Administered 2021-07-28: 10 mg via INTRAVENOUS
  Filled 2021-07-28: qty 10

## 2021-07-28 MED ORDER — FAM-TRASTUZUMAB DERUXTECAN-NXKI CHEMO 100 MG IV SOLR
5.7500 mg/kg | Freq: Once | INTRAVENOUS | Status: AC
  Administered 2021-07-28: 500 mg via INTRAVENOUS
  Filled 2021-07-28: qty 25

## 2021-07-28 MED ORDER — DEXTROSE 5 % IV SOLN: Freq: Once | INTRAVENOUS | Status: AC

## 2021-07-28 MED ORDER — DIPHENHYDRAMINE HCL 25 MG PO CAPS
25.0000 mg | ORAL_CAPSULE | Freq: Once | ORAL | Status: AC
  Administered 2021-07-28: 25 mg via ORAL
  Filled 2021-07-28: qty 1

## 2021-07-28 NOTE — Patient Instructions (Signed)
[facility]  Discharge Instructions: Thank you for choosing [facility] to provide your oncology and hematology care.   If you have a lab appointment with the [facility], please go directly to the [facility] and check in at the registration area.   Wear comfortable clothing and clothing appropriate for easy access to any Portacath or PICC line.   We strive to give you quality time with your provider. You may need to reschedule your appointment if you arrive late (15 or more minutes).  Arriving late affects you and other patients whose appointments are after yours.  Also, if you miss three or more appointments without notifying the office, you may be dismissed from the clinic at the provider's discretion.      For prescription refill requests, have your pharmacy contact our office and allow 72 hours for refills to be completed.    Today you received the following chemotherapy and/or immunotherapy agents: fam-trastuzumab (enhertu)      To help prevent nausea and vomiting after your treatment, we encourage you to take your nausea medication as directed.  BELOW ARE SYMPTOMS THAT SHOULD BE REPORTED IMMEDIATELY: *FEVER GREATER THAN 100.4 F (38 C) OR HIGHER *CHILLS OR SWEATING *NAUSEA AND VOMITING THAT IS NOT CONTROLLED WITH YOUR NAUSEA MEDICATION *UNUSUAL SHORTNESS OF BREATH *UNUSUAL BRUISING OR BLEEDING *URINARY PROBLEMS (pain or burning when urinating, or frequent urination) *BOWEL PROBLEMS (unusual diarrhea, constipation, pain near the anus) TENDERNESS IN MOUTH AND THROAT WITH OR WITHOUT PRESENCE OF ULCERS (sore throat, sores in mouth, or a toothache) UNUSUAL RASH, SWELLING OR PAIN  UNUSUAL VAGINAL DISCHARGE OR ITCHING   Items with * indicate a potential emergency and should be followed up as soon as possible or go to the Emergency Department if any problems should occur.  Please show the CHEMOTHERAPY ALERT CARD or IMMUNOTHERAPY ALERT  CARD at check-in to the Emergency Department and triage nurse.  Should you have questions after your visit or need to cancel or reschedule your appointment, please contact [facility]  Dept: [phone]  and follow the prompts.  Office hours are 8:00 a.m. to 4:30 p.m. Monday - Friday. Please note that voicemails left after 4:00 p.m. may not be returned until the following business day.  We are closed weekends and major holidays. You have access to a nurse at all times for urgent questions. Please call the main number to the clinic Dept: [phone] and follow the prompts.   For any non-urgent questions, you may also contact your provider using MyChart. We now offer e-Visits for anyone 32 and older to request care online for non-urgent symptoms. For details visit [email or web address].   Also download the MyChart app! Go to the app store, search "MyChart", open the app, select [facility], and log in with your MyChart username and password.  Masks are optional in the cancer centers. If you would like for your care team to wear a mask while they are taking care of you, please let them know. For doctor visits, patients may have with them one support person who is at least 66 years old. At this time, visitors are not allowed in the infusion area.   Fam-Trastuzumab Deruxtecan Injection What is this medication? FAM-TRASTUZUMAB DERUXTECAN (fam-tras TOOZ eu mab DER ux TEE kan) treats some types of cancer. It works by blocking a protein that causes cancer cells to grow and multiply. This helps to slow or stop the spread of cancer cells. This  medicine may be used for other purposes; ask your health care provider or pharmacist if you have questions. COMMON BRAND NAME(S): ENHERTU What should I tell my care team before I take this medication? They need to know if you have any of these conditions: Heart disease Heart failure Infection, especially a viral infection, such as  chickenpox, cold sores, or herpes Liver disease Lung or breathing disease, such as asthma or COPD An unusual or allergic reaction to fam-trastuzumab deruxtecan, other medications, foods, dyes, or preservatives Pregnant or trying to get pregnant Breast-feeding How should I use this medication? This medication is injected into a vein. It is given by your care team in a hospital or clinic setting. A special MedGuide will be given to you before each treatment. Be sure to read this information carefully each time. Talk to your care team about the use of this medication in children. Special care may be needed. Overdosage: If you think you have taken too much of this medicine contact a poison control center or emergency room at once. NOTE: This medicine is only for you. Do not share this medicine with others. What if I miss a dose? It is important not to miss your dose. Call your care team if you are unable to keep an appointment. What may interact with this medication? Interactions are not expected. This list may not describe all possible interactions. Give your health care provider a list of all the medicines, herbs, non-prescription drugs, or dietary supplements you use. Also tell them if you smoke, drink alcohol, or use illegal drugs. Some items may interact with your medicine. What should I watch for while using this medication? Visit your care team for regular checks on your progress. Tell your care team if your symptoms do not start to get better or if they get worse. Your condition will be monitored carefully while you are receiving this medication. Do not become pregnant while taking this medication or for 7 months after stopping it. Women should inform their care team if they wish to become pregnant or think they might be pregnant. Men should not father a child while taking this medication and for 4 months after stopping it. There is potential for serious side effects to an unborn child. Talk  to your care team for more information. Do not breast-feed an infant while taking this medication or for 7 months after the last dose. This medication has caused decreased sperm counts in some men. This may make it more difficult to father a child. Talk to your care team if you are concerned about your fertility. This medication may increase your risk to bruise or bleed. Call your care team if you notice any unusual bleeding. Be careful brushing or flossing your teeth or using a toothpick because you may get an infection or bleed more easily. If you have any dental work done, tell your dentist you are receiving this medication. This medication may cause dry eyes and blurred vision. If you wear contact lenses, you may feel some discomfort. Lubricating eye drops may help. See your care team if the problem does not go away or is severe. This medication may increase your risk of getting an infection. Call your care team for advice if you get a fever, chills, sore throat, or other symptoms of a cold or flu. Do not treat yourself. Try to avoid being around people who are sick. Avoid taking medications that contain aspirin, acetaminophen, ibuprofen, naproxen, or ketoprofen unless instructed by your care team.  These medications may hide a fever. What side effects may I notice from receiving this medication? Side effects that you should report to your care team as soon as possible: Allergic reactions--skin rash, itching, hives, swelling of the face, lips, tongue, or throat Dry cough, shortness of breath or trouble breathing Infection--fever, chills, cough, sore throat, wounds that don't heal, pain or trouble when passing urine, general feeling of discomfort or being unwell Heart failure--shortness of breath, swelling of the ankles, feet, or hands, sudden weight gain, unusual weakness or fatigue Unusual bruising or bleeding Side effects that usually do not require medical attention (report these to your care  team if they continue or are bothersome): Constipation Diarrhea Hair loss Muscle pain Nausea Vomiting This list may not describe all possible side effects. Call your doctor for medical advice about side effects. You may report side effects to FDA at 1-800-FDA-1088. Where should I keep my medication? This medication is given in a hospital or clinic. It will not be stored at home. NOTE: This sheet is a summary. It may not cover all possible information. If you have questions about this medicine, talk to your doctor, pharmacist, or health care provider.  2023 Elsevier/Gold Standard (2020-10-06 00:00:00)

## 2021-07-29 ENCOUNTER — Encounter (HOSPITAL_BASED_OUTPATIENT_CLINIC_OR_DEPARTMENT_OTHER): Payer: Self-pay | Admitting: General Surgery

## 2021-07-29 ENCOUNTER — Other Ambulatory Visit: Payer: Self-pay

## 2021-07-29 ENCOUNTER — Telehealth: Payer: Self-pay | Admitting: *Deleted

## 2021-07-29 NOTE — Telephone Encounter (Signed)
-----   Message from Fara Boros, RN sent at 07/28/2021  2:09 PM EDT ----- Regarding: first time enhertu [place] patient First time enhertu today, [place] patient. Tolerated well.

## 2021-07-29 NOTE — Telephone Encounter (Signed)
Called patient for chemo follow up. States she is eating and drinking without problems. Denies N/V. No questions or concerns

## 2021-07-30 ENCOUNTER — Ambulatory Visit (HOSPITAL_COMMUNITY): Payer: Managed Care, Other (non HMO)

## 2021-08-02 NOTE — Progress Notes (Signed)

## 2021-08-03 ENCOUNTER — Encounter (HOSPITAL_COMMUNITY)
Admission: RE | Admit: 2021-08-03 | Discharge: 2021-08-03 | Disposition: A | Discharge status: Home / Self Care | Payer: Managed Care, Other (non HMO) | Source: Ambulatory Visit | Attending: Hematology and Oncology | Admitting: Hematology and Oncology

## 2021-08-03 ENCOUNTER — Ambulatory Visit (HOSPITAL_COMMUNITY)
Admission: RE | Admit: 2021-08-03 | Discharge: 2021-08-03 | Disposition: A | Discharge status: Home / Self Care | Payer: Managed Care, Other (non HMO) | Source: Ambulatory Visit | Attending: Hematology and Oncology | Admitting: Hematology and Oncology

## 2021-08-03 ENCOUNTER — Other Ambulatory Visit (HOSPITAL_COMMUNITY): Payer: Managed Care, Other (non HMO)

## 2021-08-03 ENCOUNTER — Encounter (HOSPITAL_COMMUNITY): Payer: Self-pay

## 2021-08-03 DIAGNOSIS — C50411 Malignant neoplasm of upper-outer quadrant of right female breast: Secondary | ICD-10-CM | POA: Insufficient documentation

## 2021-08-03 DIAGNOSIS — Z170 Estrogen receptor positive status [ER+]: Secondary | ICD-10-CM | POA: Diagnosis present

## 2021-08-03 MED ORDER — TECHNETIUM TC 99M MEDRONATE IV KIT
20.0000 | PACK | Freq: Once | INTRAVENOUS | Status: AC | PRN
  Administered 2021-08-03: 20 via INTRAVENOUS

## 2021-08-03 MED ORDER — IOHEXOL 300 MG/ML  SOLN
100.0000 mL | Freq: Once | INTRAMUSCULAR | Status: AC | PRN
  Administered 2021-08-03: 100 mL via INTRAVENOUS

## 2021-08-03 MED ORDER — SODIUM CHLORIDE (PF) 0.9 % IJ SOLN
INTRAMUSCULAR | Status: AC
  Filled 2021-08-03: qty 50

## 2021-08-04 NOTE — Anesthesia Preprocedure Evaluation (Signed)
Anesthesia Evaluation  Patient identified by MRN, date of birth, ID band Patient awake    Reviewed: Allergy & Precautions, NPO status , Patient's Chart, lab work & pertinent test results  History of Anesthesia Complications (+) PONV and history of anesthetic complications  Airway Mallampati: II  TM Distance: >3 FB Neck ROM: Full    Dental no notable dental hx. (+) Teeth Intact, Dental Advisory Given   Pulmonary Current Smoker,    Pulmonary exam normal breath sounds clear to auscultation       Cardiovascular Normal cardiovascular exam Rhythm:Regular Rate:Normal     Neuro/Psych Anxiety    GI/Hepatic Neg liver ROS, GERD  ,  Endo/Other  negative endocrine ROS  Renal/GU Lab Results      Component                Value               Date                      CREATININE               0.72                07/28/2021                K                        4.0                 07/28/2021                   Musculoskeletal   Abdominal (+) + obese (BMI 31.6),   Peds  Hematology Lab Results      Component                Value               Date                      WBC                      8.0                 07/28/2021                HGB                      13.4                07/28/2021                HCT                      38.4                07/28/2021                MCV                      90.6                07/28/2021                PLT  283                 07/28/2021              Anesthesia Other Findings Breast CA  Reproductive/Obstetrics                            Anesthesia Physical Anesthesia Plan  ASA: 2  Anesthesia Plan: General   Post-op Pain Management:    Induction: Intravenous  PONV Risk Score and Plan: Treatment may vary due to age or medical condition, Midazolam and Ondansetron  Airway Management Planned: LMA  Additional Equipment: None  Intra-op  Plan:   Post-operative Plan: Extubation in OR  Informed Consent: I have reviewed the patients History and Physical, chart, labs and discussed the procedure including the risks, benefits and alternatives for the proposed anesthesia with the patient or authorized representative who has indicated his/her understanding and acceptance.     Dental advisory given  Plan Discussed with:   Anesthesia Plan Comments:        Anesthesia Quick Evaluation

## 2021-08-05 ENCOUNTER — Encounter (HOSPITAL_BASED_OUTPATIENT_CLINIC_OR_DEPARTMENT_OTHER)
Admission: RE | Disposition: A | Discharge status: Home / Self Care | Payer: Self-pay | Source: Home / Self Care | Attending: General Surgery

## 2021-08-05 ENCOUNTER — Ambulatory Visit (HOSPITAL_COMMUNITY): Payer: Managed Care, Other (non HMO)

## 2021-08-05 ENCOUNTER — Ambulatory Visit (HOSPITAL_BASED_OUTPATIENT_CLINIC_OR_DEPARTMENT_OTHER): Payer: Managed Care, Other (non HMO) | Admitting: Anesthesiology

## 2021-08-05 ENCOUNTER — Telehealth: Payer: Self-pay | Admitting: Hematology and Oncology

## 2021-08-05 ENCOUNTER — Encounter (HOSPITAL_BASED_OUTPATIENT_CLINIC_OR_DEPARTMENT_OTHER): Payer: Self-pay | Admitting: General Surgery

## 2021-08-05 ENCOUNTER — Ambulatory Visit (HOSPITAL_BASED_OUTPATIENT_CLINIC_OR_DEPARTMENT_OTHER)
Admission: RE | Admit: 2021-08-05 | Discharge: 2021-08-05 | Disposition: A | Discharge status: Home / Self Care | Payer: Managed Care, Other (non HMO) | Attending: General Surgery | Admitting: General Surgery

## 2021-08-05 DIAGNOSIS — Z171 Estrogen receptor negative status [ER-]: Secondary | ICD-10-CM | POA: Insufficient documentation

## 2021-08-05 DIAGNOSIS — Z452 Encounter for adjustment and management of vascular access device: Secondary | ICD-10-CM | POA: Diagnosis present

## 2021-08-05 DIAGNOSIS — C50911 Malignant neoplasm of unspecified site of right female breast: Secondary | ICD-10-CM | POA: Diagnosis not present

## 2021-08-05 DIAGNOSIS — C50919 Malignant neoplasm of unspecified site of unspecified female breast: Secondary | ICD-10-CM

## 2021-08-05 DIAGNOSIS — Z6831 Body mass index (BMI) 31.0-31.9, adult: Secondary | ICD-10-CM | POA: Diagnosis not present

## 2021-08-05 DIAGNOSIS — F17210 Nicotine dependence, cigarettes, uncomplicated: Secondary | ICD-10-CM | POA: Diagnosis not present

## 2021-08-05 DIAGNOSIS — Z9221 Personal history of antineoplastic chemotherapy: Secondary | ICD-10-CM | POA: Diagnosis not present

## 2021-08-05 DIAGNOSIS — Z170 Estrogen receptor positive status [ER+]: Secondary | ICD-10-CM

## 2021-08-05 DIAGNOSIS — K219 Gastro-esophageal reflux disease without esophagitis: Secondary | ICD-10-CM | POA: Insufficient documentation

## 2021-08-05 DIAGNOSIS — Z79899 Other long term (current) drug therapy: Secondary | ICD-10-CM | POA: Diagnosis not present

## 2021-08-05 DIAGNOSIS — Z01818 Encounter for other preprocedural examination: Secondary | ICD-10-CM

## 2021-08-05 DIAGNOSIS — Z9011 Acquired absence of right breast and nipple: Secondary | ICD-10-CM | POA: Insufficient documentation

## 2021-08-05 DIAGNOSIS — E669 Obesity, unspecified: Secondary | ICD-10-CM | POA: Insufficient documentation

## 2021-08-05 DIAGNOSIS — F419 Anxiety disorder, unspecified: Secondary | ICD-10-CM | POA: Diagnosis not present

## 2021-08-05 HISTORY — PX: PORTACATH PLACEMENT: SHX2246

## 2021-08-05 SURGERY — INSERTION, TUNNELED CENTRAL VENOUS DEVICE, WITH PORT
Anesthesia: General | Site: Chest | Laterality: Left

## 2021-08-05 MED ORDER — DEXAMETHASONE SODIUM PHOSPHATE 10 MG/ML IJ SOLN
INTRAMUSCULAR | Status: AC
  Filled 2021-08-05: qty 1

## 2021-08-05 MED ORDER — HEPARIN (PORCINE) IN NACL 1000-0.9 UT/500ML-% IV SOLN
INTRAVENOUS | Status: AC
  Filled 2021-08-05: qty 500

## 2021-08-05 MED ORDER — ACETAMINOPHEN 500 MG PO TABS
ORAL_TABLET | ORAL | Status: AC
  Filled 2021-08-05: qty 2

## 2021-08-05 MED ORDER — CEFAZOLIN SODIUM-DEXTROSE 2-4 GM/100ML-% IV SOLN
INTRAVENOUS | Status: AC
  Filled 2021-08-05: qty 100

## 2021-08-05 MED ORDER — HEPARIN SOD (PORK) LOCK FLUSH 100 UNIT/ML IV SOLN
INTRAVENOUS | Status: DC | PRN
  Administered 2021-08-05: 500 [IU] via INTRAVENOUS

## 2021-08-05 MED ORDER — PROPOFOL 500 MG/50ML IV EMUL
INTRAVENOUS | Status: AC
  Filled 2021-08-05: qty 50

## 2021-08-05 MED ORDER — PROPOFOL 10 MG/ML IV BOLUS
INTRAVENOUS | Status: AC
  Filled 2021-08-05: qty 20

## 2021-08-05 MED ORDER — ACETAMINOPHEN 500 MG PO TABS
ORAL_TABLET | ORAL | Status: AC
  Filled 2021-08-05: qty 1

## 2021-08-05 MED ORDER — HEPARIN SOD (PORK) LOCK FLUSH 100 UNIT/ML IV SOLN
INTRAVENOUS | Status: AC
  Filled 2021-08-05: qty 5

## 2021-08-05 MED ORDER — CHLORHEXIDINE GLUCONATE CLOTH 2 % EX PADS: 6.0000 | MEDICATED_PAD | Freq: Once | CUTANEOUS | Status: DC

## 2021-08-05 MED ORDER — ATROPINE SULFATE 0.4 MG/ML IV SOLN
INTRAVENOUS | Status: AC
  Filled 2021-08-05: qty 1

## 2021-08-05 MED ORDER — FENTANYL CITRATE (PF) 100 MCG/2ML IJ SOLN
INTRAMUSCULAR | Status: DC | PRN
  Administered 2021-08-05: 50 ug via INTRAVENOUS

## 2021-08-05 MED ORDER — SUCCINYLCHOLINE CHLORIDE 200 MG/10ML IV SOSY
PREFILLED_SYRINGE | INTRAVENOUS | Status: AC
  Filled 2021-08-05: qty 10

## 2021-08-05 MED ORDER — LIDOCAINE 2% (20 MG/ML) 5 ML SYRINGE
INTRAMUSCULAR | Status: AC
  Filled 2021-08-05: qty 5

## 2021-08-05 MED ORDER — ACETAMINOPHEN 10 MG/ML IV SOLN: 1000.0000 mg | Freq: Once | INTRAVENOUS | Status: DC | PRN

## 2021-08-05 MED ORDER — PHENYLEPHRINE HCL (PRESSORS) 10 MG/ML IV SOLN
INTRAVENOUS | Status: DC | PRN
  Administered 2021-08-05 (×2): 40 ug via INTRAVENOUS

## 2021-08-05 MED ORDER — DEXAMETHASONE SODIUM PHOSPHATE 4 MG/ML IJ SOLN
INTRAMUSCULAR | Status: DC | PRN
  Administered 2021-08-05: 5 mg via INTRAVENOUS

## 2021-08-05 MED ORDER — MIDAZOLAM HCL 5 MG/5ML IJ SOLN
INTRAMUSCULAR | Status: DC | PRN
  Administered 2021-08-05: 1 mg via INTRAVENOUS

## 2021-08-05 MED ORDER — BUPIVACAINE HCL (PF) 0.25 % IJ SOLN
INTRAMUSCULAR | Status: DC | PRN
  Administered 2021-08-05: 8 mL

## 2021-08-05 MED ORDER — FENTANYL CITRATE (PF) 100 MCG/2ML IJ SOLN: 25.0000 ug | INTRAMUSCULAR | Status: DC | PRN

## 2021-08-05 MED ORDER — ONDANSETRON HCL 4 MG/2ML IJ SOLN
INTRAMUSCULAR | Status: AC
  Filled 2021-08-05: qty 2

## 2021-08-05 MED ORDER — PHENYLEPHRINE 80 MCG/ML (10ML) SYRINGE FOR IV PUSH (FOR BLOOD PRESSURE SUPPORT)
PREFILLED_SYRINGE | INTRAVENOUS | Status: AC
  Filled 2021-08-05: qty 10

## 2021-08-05 MED ORDER — ONDANSETRON HCL 4 MG/2ML IJ SOLN: 4.0000 mg | Freq: Once | INTRAMUSCULAR | Status: DC | PRN

## 2021-08-05 MED ORDER — LIDOCAINE 2% (20 MG/ML) 5 ML SYRINGE
INTRAMUSCULAR | Status: DC | PRN
  Administered 2021-08-05: 80 mg via INTRAVENOUS

## 2021-08-05 MED ORDER — ACETAMINOPHEN 500 MG PO TABS
1000.0000 mg | ORAL_TABLET | ORAL | Status: AC
  Administered 2021-08-05: 1000 mg via ORAL

## 2021-08-05 MED ORDER — FENTANYL CITRATE (PF) 100 MCG/2ML IJ SOLN
INTRAMUSCULAR | Status: AC
  Filled 2021-08-05: qty 2

## 2021-08-05 MED ORDER — ONDANSETRON HCL 4 MG/2ML IJ SOLN
INTRAMUSCULAR | Status: AC
  Filled 2021-08-05: qty 4

## 2021-08-05 MED ORDER — ONDANSETRON HCL 4 MG/2ML IJ SOLN
INTRAMUSCULAR | Status: DC | PRN
  Administered 2021-08-05: 4 mg via INTRAVENOUS

## 2021-08-05 MED ORDER — CEFAZOLIN SODIUM-DEXTROSE 2-4 GM/100ML-% IV SOLN
2.0000 g | INTRAVENOUS | Status: AC
  Administered 2021-08-05: 2 g via INTRAVENOUS

## 2021-08-05 MED ORDER — LACTATED RINGERS IV SOLN
INTRAVENOUS | Status: DC
Start: 2021-08-05 — End: 2021-08-05

## 2021-08-05 MED ORDER — PROPOFOL 10 MG/ML IV BOLUS
INTRAVENOUS | Status: DC | PRN
  Administered 2021-08-05: 170 mg via INTRAVENOUS

## 2021-08-05 MED ORDER — EPHEDRINE 5 MG/ML INJ
INTRAVENOUS | Status: AC
  Filled 2021-08-05: qty 5

## 2021-08-05 MED ORDER — ENSURE PRE-SURGERY PO LIQD: 296.0000 mL | Freq: Once | ORAL | Status: DC

## 2021-08-05 MED ORDER — HEPARIN (PORCINE) IN NACL 2-0.9 UNITS/ML
INTRAMUSCULAR | Status: AC | PRN
  Administered 2021-08-05: 1 via INTRAVENOUS

## 2021-08-05 SURGICAL SUPPLY — 52 items
ADH SKN CLS APL DERMABOND .7 (GAUZE/BANDAGES/DRESSINGS) ×1
APL PRP STRL LF DISP 70% ISPRP (MISCELLANEOUS) ×1
APL SKNCLS STERI-STRIP NONHPOA (GAUZE/BANDAGES/DRESSINGS)
BAG DECANTER FOR FLEXI CONT (MISCELLANEOUS) ×3 IMPLANT
BENZOIN TINCTURE PRP APPL 2/3 (GAUZE/BANDAGES/DRESSINGS) ×2 IMPLANT
BLADE SURG 11 STRL SS (BLADE) ×3 IMPLANT
BLADE SURG 15 STRL LF DISP TIS (BLADE) ×2 IMPLANT
BLADE SURG 15 STRL SS (BLADE) ×2
CANISTER SUCT 1200ML W/VALVE (MISCELLANEOUS) IMPLANT
CHLORAPREP W/TINT 26 (MISCELLANEOUS) ×3 IMPLANT
COVER BACK TABLE 60X90IN (DRAPES) ×3 IMPLANT
COVER MAYO STAND STRL (DRAPES) ×3 IMPLANT
COVER PROBE 5X48 (MISCELLANEOUS) ×2
DERMABOND ADVANCED (GAUZE/BANDAGES/DRESSINGS) ×1
DERMABOND ADVANCED .7 DNX12 (GAUZE/BANDAGES/DRESSINGS) ×2 IMPLANT
DRAPE C-ARM 42X72 X-RAY (DRAPES) ×3 IMPLANT
DRAPE LAPAROSCOPIC ABDOMINAL (DRAPES) ×3 IMPLANT
DRAPE UTILITY XL STRL (DRAPES) ×3 IMPLANT
DRSG TEGADERM 4X4.75 (GAUZE/BANDAGES/DRESSINGS) IMPLANT
ELECT COATED BLADE 2.86 ST (ELECTRODE) ×3 IMPLANT
ELECT REM PT RETURN 9FT ADLT (ELECTROSURGICAL) ×2
ELECTRODE REM PT RTRN 9FT ADLT (ELECTROSURGICAL) ×2 IMPLANT
GAUZE 4X4 16PLY ~~LOC~~+RFID DBL (SPONGE) ×3 IMPLANT
GAUZE SPONGE 4X4 12PLY STRL LF (GAUZE/BANDAGES/DRESSINGS) ×3 IMPLANT
GLOVE BIO SURGEON STRL SZ7 (GLOVE) ×3 IMPLANT
GLOVE BIOGEL PI IND STRL 7.5 (GLOVE) ×2 IMPLANT
GLOVE BIOGEL PI INDICATOR 7.5 (GLOVE) ×1
GOWN STRL REUS W/ TWL LRG LVL3 (GOWN DISPOSABLE) ×4 IMPLANT
GOWN STRL REUS W/TWL LRG LVL3 (GOWN DISPOSABLE) ×4
IV KIT MINILOC 20X1 SAFETY (NEEDLE) IMPLANT
KIT CVR 48X5XPRB PLUP LF (MISCELLANEOUS) IMPLANT
KIT PORT POWER 8FR ISP CVUE (Port) ×1 IMPLANT
NDL HYPO 25X1 1.5 SAFETY (NEEDLE) ×2 IMPLANT
NDL SAFETY ECLIPSE 18X1.5 (NEEDLE) IMPLANT
NEEDLE HYPO 18GX1.5 SHARP (NEEDLE)
NEEDLE HYPO 25X1 1.5 SAFETY (NEEDLE) ×2 IMPLANT
PACK BASIN DAY SURGERY FS (CUSTOM PROCEDURE TRAY) ×3 IMPLANT
PENCIL SMOKE EVACUATOR (MISCELLANEOUS) ×3 IMPLANT
SLEEVE SCD COMPRESS KNEE MED (STOCKING) ×3 IMPLANT
SPIKE FLUID TRANSFER (MISCELLANEOUS) IMPLANT
STRIP CLOSURE SKIN 1/2X4 (GAUZE/BANDAGES/DRESSINGS) ×3 IMPLANT
SUT MNCRL AB 4-0 PS2 18 (SUTURE) ×3 IMPLANT
SUT PROLENE 2 0 SH DA (SUTURE) ×3 IMPLANT
SUT SILK 2 0 TIES 17X18 (SUTURE)
SUT SILK 2-0 18XBRD TIE BLK (SUTURE) IMPLANT
SUT VIC AB 3-0 SH 27 (SUTURE) ×2
SUT VIC AB 3-0 SH 27X BRD (SUTURE) ×2 IMPLANT
SYR 5ML LUER SLIP (SYRINGE) ×2 IMPLANT
SYR CONTROL 10ML LL (SYRINGE) ×3 IMPLANT
TOWEL GREEN STERILE FF (TOWEL DISPOSABLE) ×3 IMPLANT
TUBE CONNECTING 20X1/4 (TUBING) ×1 IMPLANT
YANKAUER SUCT BULB TIP NO VENT (SUCTIONS) ×1 IMPLANT

## 2021-08-05 NOTE — Anesthesia Procedure Notes (Signed)
Procedure Name: LMA Insertion Date/Time: 08/05/2021 2:32 PM  Performed by: Willa Frater, CRNAPre-anesthesia Checklist: Patient identified, Emergency Drugs available, Suction available and Patient being monitored Patient Re-evaluated:Patient Re-evaluated prior to induction Oxygen Delivery Method: Circle System Utilized Preoxygenation: Pre-oxygenation with 100% oxygen Induction Type: IV induction Ventilation: Mask ventilation without difficulty LMA: LMA inserted LMA Size: 4.0 Number of attempts: 1 Airway Equipment and Method: bite block Placement Confirmation: positive ETCO2 Tube secured with: Tape Dental Injury: Teeth and Oropharynx as per pre-operative assessment

## 2021-08-05 NOTE — Op Note (Signed)
Preoperative diagnosis: Stage IV breast cancer Postoperative diagnosis: Same as above Procedure: Left subclavian port placement Surgeon: Dr. Serita Grammes Anesthesia: General Estimated blood loss: Minimal Complications: None Drains: None Specimens: None Special count was correct completion Disposition to recovery stable condition  Indications: This is a 66 year old female breast cancer patient of mine who has undergone neoadjuvant chemotherapy followed by modified radical mastectomy in August 2022.  This was followed by radiotherapy.  She had some skin changes noted.  Biopsies and these showed recurrence of a triple negative cancer.  She has begun chemotherapy and I discussed the port placement with her.  Procedure: After informed consent was obtained the patient was taken to the operating room.  She was given antibiotics.  SCDs were placed.  She was placed under general anesthesia without complication.  She was prepped and draped in the standard sterile surgical fashion.  A surgical timeout was then performed.  Due to the prior radiation as well as her tumor recurrence I elected to place this in the left subclavian.  I was able to access the left subclavian vein on the first pass.  This was clearly venous blood.  The wire passed easily.  Fluoroscopy confirmed the wire to be in the vena cava.  I then made an incision and developed a pocket below this.  I tunneled the line between the 2 sites.  I then using fluoroscopy placed the dilator and remove the wire.  I then passed the line through the peel-away sheath.  The peel-away sheath was removed.  I pulled the line back to be in the vena cava.  The tip of the line now sits in the distal vena cava.  I then attached this to the port.  I sutured the port into the pocket with a 2-0 Prolene suture.  I then accessed the port.  This aspirated blood and flushed easily.  I then closed this with 3-0 Vicryl and 4-0 Monocryl.  Glue and a Steri-Strip were placed.   She tolerated this well and was transferred to recovery stable.

## 2021-08-05 NOTE — Anesthesia Postprocedure Evaluation (Signed)
Anesthesia Post Note  Patient: Sabrina Woods  Procedure(s) Performed: PORT PLACEMENT WITH ULTRASOUND GUIDANCE (Left: Chest)     Patient location during evaluation: PACU Anesthesia Type: General Level of consciousness: awake and alert Pain management: pain level controlled Vital Signs Assessment: post-procedure vital signs reviewed and stable Respiratory status: spontaneous breathing, nonlabored ventilation, respiratory function stable and patient connected to nasal cannula oxygen Cardiovascular status: blood pressure returned to baseline and stable Postop Assessment: no apparent nausea or vomiting Anesthetic complications: no   No notable events documented.  Last Vitals:  Vitals:   08/05/21 1332 08/05/21 1515  BP: 128/71 (!) 157/84  Pulse: 97 78  Resp: 20 12  Temp: 37.1 C   SpO2: 100% 98%    Last Pain:  Vitals:   08/05/21 1515  TempSrc:   PainSc: [place]

## 2021-08-05 NOTE — H&P (Signed)
  18 yof who had US shows at least a 11x5.9x6.3 cm mass that extends to the skin at 10 oclock. in the axilla there are two adjacent masses measuring 2.6x2.4cm and 2.5x2.6 cm. In the left breast at 330 there is a small group of cysts, there is another group of cysts near that, some ax nodes with fct and a retroareolar mass measuring 3 cm greatest dimension. 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 the right ax node is IDC. the mass is er pos (5%), pr neg, her 2 pos, Ki 25% IDC that is grade III. she underwent primary chemotherapy she had repeat mri 6/20 that shows necrotic mass in uoq measuring 9 cm (prior 10.5), smaller satellite mass is 1.7 cm from 2.2. has enlarged right axillary nodes. She underwent right modified radical mastectomy. 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 This had satellite skin nodules and invasion into the dermal lymphatics of the nipple. This involved the skeletal muscle and widely along the deep margin. The axillary excision said metastatic carcinoma in 2 lymph nodes and a group of matted lymph nodes. Clinically I removed a portion of the pectoralis major and the pectoralis minor so there is no more tissue to remove it at these locations. I also completely cleared her axilla so there are many more than 2 lymph nodes this was just a large matted mass of lymph nodes. She underwent radiotherapy. Since April she has had skin changes/rash at mastectomy site. This was thought to be due to shingles. But this has expanded since then. Not very painful. I did a biopsy and this is breast cancer.  It is tnbc.  She has started chemotherapy and I was asked to place a port.   Medical History: Past Medical History:  Diagnosis Date  History of cancer   Patient Active Problem List  Diagnosis  Malignant neoplasm of upper-outer quadrant of right breast in female, estrogen receptor positive (CMS-HCC)    Past Surgical History:  Procedure Laterality Date  MASTECTOMY Right  MRM    No Known Allergies  Current Outpatient Medications on File Prior to Visit  Medication Sig Dispense Refill  ALPRAZolam (XANAX) 0.25 MG tablet Take 0.25 mg by mouth 2 (two) times daily as needed  gabapentin (NEURONTIN) 300 MG capsule Take 300 mg by mouth 3 (three) times daily  multivitamin capsule Take 1 capsule by mouth once daily    Family History  Problem Relation Age of Onset  High blood pressure (Hypertension) Mother  Diabetes Mother  Breast cancer Mother  High blood pressure (Hypertension) Father  High blood pressure (Hypertension) Sister    Social History   Tobacco Use  Smoking Status Every Day  Packs/day: 1.00  Types: Cigarettes  Smokeless Tobacco Never  Marital status: Married  Tobacco Use  Smoking status: Every Day  Packs/day: 1.00  Types: Cigarettes  Smokeless tobacco: Never  Substance and Sexual Activity  Alcohol use: Not Currently  Drug use: Never   Objective:    Physical Exam   Right chest wall with numerous red raised lesions and scabs this now extends laterally Cv regular Pulm effort normal  Assessment and Plan:   Malignant neoplasm of upper-outer quadrant of right breast in female, estrogen receptor positive (CMS-HCC)  Skin recurrence  Plan for port placement today

## 2021-08-05 NOTE — Telephone Encounter (Signed)
I informed the patient that after speaking with pulmonary physician Dr. Verlee Monte, she will be getting an appointment with them to discuss bronchoscopy and the robot directed biopsy

## 2021-08-05 NOTE — Transfer of Care (Signed)
Immediate Anesthesia Transfer of Care Note  Patient: [facility]  Procedure(s) Performed: PORT PLACEMENT WITH ULTRASOUND GUIDANCE (Left: Chest)  Patient Location: PACU  Anesthesia Type:General  Level of Consciousness: drowsy and patient cooperative  Airway & Oxygen Therapy: Patient Spontanous Breathing and Patient connected to face mask oxygen  Post-op Assessment: Report given to RN and Post -op Vital signs reviewed and stable  Post vital signs: Reviewed and stable  Last Vitals:  Vitals Value Taken Time  BP 157/84 08/05/21 1515  Temp    Pulse 76 08/05/21 1516  Resp 13 08/05/21 1516  SpO2 99 % 08/05/21 1516  Vitals shown include unvalidated device data.  Last Pain:  Vitals:   08/05/21 1332  TempSrc: Oral  PainSc: 0-No pain         Complications: No notable events documented.

## 2021-08-05 NOTE — Discharge Instructions (Addendum)
PORT-A-CATH: POST OP INSTRUCTIONS  Always review your discharge instruction sheet given to you by the facility where your surgery was performed.   A prescription for pain medication may be given to you upon discharge. Take your pain medication as prescribed, if needed. If narcotic pain medicine is not needed, then you make take acetaminophen (Tylenol) or ibuprofen (Advil) as needed.  Take your usually prescribed medications unless otherwise directed. If you need a refill on your pain medication, please contact our office. All narcotic pain medicine now requires a paper prescription.  Phoned in and fax refills are no longer allowed by law.  Prescriptions will not be filled after 5 pm or on weekends.  You should follow a light diet for the remainder of the day after your procedure. Most patients will experience some mild swelling and/or bruising in the area of the incision. It may take several days to resolve. It is common to experience some constipation if taking pain medication after surgery. Increasing fluid intake and taking a stool softener (such as Colace) will usually help or prevent this problem from occurring. A mild laxative (Milk of Magnesia or Miralax) should be taken according to package directions if there are no bowel movements after 48 hours.  Unless discharge instructions indicate otherwise, you may remove your bandages 48 hours after surgery, and you may shower at that time. You may have steri-strips (small white skin tapes) in place directly over the incision.  These strips should be left on the skin for 7-10 days.  If your surgeon used Dermabond (skin glue) on the incision, you may shower in 24 hours.  The glue will flake off over the next 2-3 weeks.  If your port is left accessed at the end of surgery (needle left in port), the dressing cannot get wet and should only by changed by a healthcare professional. When the port is no longer accessed (when the needle has been removed),  follow step 7.   ACTIVITIES:  Limit activity involving your arms for the next 72 hours. Do no strenuous exercise or activity for 1 week. You may drive when you are no longer taking prescription pain medication, you can comfortably wear a seatbelt, and you can maneuver your car. 10.You may need to see your doctor in the office for a follow-up appointment.  Please       check with your doctor.  11.When you receive a new Port-a-Cath, you will get a product guide and        ID card.  Please keep them in case you need them.  WHEN TO CALL YOUR DOCTOR [phone]): Fever over 101.0 Chills Continued bleeding from incision Increased redness and tenderness at the site Shortness of breath, difficulty breathing   The clinic staff is available to answer your questions during regular business hours. Please don't hesitate to call and ask to speak to one of the nurses or medical assistants for clinical concerns. If you have a medical emergency, go to the nearest emergency room or call 911.  A surgeon from [facility] is always on call at the hospital.     For further information, please visit [email or web address]     Post Anesthesia Home Care Instructions  Activity: Get plenty of rest for the remainder of the day. A responsible individual must stay with you for 24 hours following the procedure.  For the next 24 hours, DO NOT: -Drive a car -Paediatric nurse -Drink alcoholic beverages -Take any medication unless instructed by your  physician -Make any legal decisions or sign important papers.  Meals: Start with liquid foods such as gelatin or soup. Progress to regular foods as tolerated. Avoid greasy, spicy, heavy foods. If nausea and/or vomiting occur, drink only clear liquids until the nausea and/or vomiting subsides. Call your physician if vomiting continues.  Special Instructions/Symptoms: Your throat may feel dry or sore from the anesthesia or the breathing tube  placed in your throat during surgery. If this causes discomfort, gargle with warm salt water. The discomfort should disappear within 24 hours.  If you had a scopolamine patch placed behind your ear for the management of post- operative nausea and/or vomiting:  1. The medication in the patch is effective for 72 hours, after which it should be removed.  Wrap patch in a tissue and discard in the trash. Wash hands thoroughly with soap and water. 2. You may remove the patch earlier than 72 hours if you experience unpleasant side effects which may include dry mouth, dizziness or visual disturbances. 3. Avoid touching the patch. Wash your hands with soap and water after contact with the patch.      No tylenol until after 7:30pm tonight if needed.

## 2021-08-06 ENCOUNTER — Encounter (HOSPITAL_BASED_OUTPATIENT_CLINIC_OR_DEPARTMENT_OTHER): Payer: Self-pay | Admitting: General Surgery

## 2021-08-09 NOTE — Progress Notes (Signed)
Patient Care Team: [facility] Croissant, MD as PCP - General (Hematology and Oncology) [name], MD as Consulting Physician (General Surgery) Lena Puffer, MD as Consulting Physician (Radiation Oncology)  DIAGNOSIS:  Encounter Diagnosis  Name Primary?   Malignant neoplasm of upper-outer quadrant of right breast in female, estrogen receptor positive (HCC) Yes    SUMMARY OF ONCOLOGIC HISTORY: Oncology History  Malignant neoplasm of upper-outer quadrant of right breast in female, estrogen receptor positive (HCC)  03/10/2020 Initial Diagnosis   Palpable right breast mass x6 months.  Mammogram revealed left breast cysts (intraductal papilloma), 11 cm right breast mass and 2 masses 2.6 cm each in the right axilla: Biopsy grade 3 IDC ER 5% weak, PR 0%, Ki-67 25%, HER-2 3+ IHC positive   03/10/2020 Cancer Staging   Staging form: Breast, AJCC 8th Edition - Clinical stage from 03/10/2020: Stage IIIA (cT3, cN1, cM0, G3, ER+, PR-, HER2+) - Signed by [facility]rina Croissant, MD on 03/10/2020 Stage prefix: Initial diagnosis   03/19/2020 - 07/01/2020 Chemotherapy   TCH Perjeta   08/04/2020 Surgery   Right mastectomy (Dr. 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, high-grade DCIS with necrosis and calcifications, and 2 right axillary lymph nodes positive for metastatic carcinoma   08/04/2020 Cancer Staging   Staging form: Breast, AJCC 8th Edition - Pathologic stage from 08/04/2020: No Stage Recommended (ypTis (DCIS), pN2, cM0) - Signed by Loa Socks, NP on 02/05/2021 Stage prefix: Post-therapy   08/21/2020 - 10/23/2020 Chemotherapy   Discontinued due to peripheral neuropathy Patient is on Treatment Plan : BREAST ADO-Trastuzumab Emtansine (Kadcyla) q21d     08/2020 -  Anti-estrogen oral therapy    Decided not to do antiestrogen therapy because her ER was 0% on final path.   10/06/2020 - 11/04/2020 Radiation Therapy   Adjuvant radiation     Relapse/Recurrence   Right mastectomy area skin changes: Initially treated for shingles but since lesions did not improve skin biopsy was obtained on 07/14/2021: Infiltrating carcinoma consistent with breast origin CK7 and GA TA 3 positive, ER 0%, PR 0%, HER2 2+ by IHC, FISH negative ratio 1.56, copy #2.1   07/26/2021 -  Chemotherapy   Patient is on Treatment Plan : BREAST METASTATIC Fam-Trastuzumab Deruxtecan-nxki (Enhertu) (5.4) q21d     07/28/2021 - 07/28/2021 Chemotherapy   Patient is on Treatment Plan : BREAST METASTATIC fam-trastuzumab deruxtecan-nxki (Enhertu) q21d       CHIEF COMPLIANT: Cycle 2 Enhertu  INTERVAL HISTORY: [facility] is a 66 y.o. female with above-mentioned history of right breast cancer. She presents to the clinic today for a follow-up. She states she has some fatigue the first fast days and complains of the hair thinning. Denis nausea and vomiting. She said the rash has healed and the pain is less.   ALLERGIES:  has No Known Allergies.  MEDICATIONS:  Current Outpatient Medications  Medication Sig Dispense Refill   ALPRAZolam (XANAX) 0.25 MG tablet Take 1 tablet (0.25 mg total) by mouth 2 (two) times daily as needed for anxiety. 60 tablet 3   DULoxetine (CYMBALTA) 20 MG capsule Take 20 mg by mouth daily.     DULoxetine 40 MG CPEP Take 40 mg by mouth daily. 90 capsule 3   esomeprazole (NEXIUM) 20 MG capsule Take 20 mg by mouth daily.     gabapentin (NEURONTIN) 600 MG tablet Take 1 tablet (600 mg total) by mouth 3 (three) times daily. 90 tablet 3   loratadine (CLARITIN) 10 MG tablet Take  10 mg by mouth daily as needed for rhinitis.     Multiple Vitamin (MULTIVITAMIN) capsule Take 1 capsule by mouth daily.     nicotine (NICODERM CQ - DOSED IN MG/24 HR) 7 mg/24hr patch PLACE 1 PATCH (7 MG TOTAL) ONTO THE SKIN DAILY.     oxyCODONE-acetaminophen (PERCOCET/ROXICET) 5-325 MG tablet Take 1 tablet by mouth every 8 (eight) hours as needed for severe pain. 60 tablet 0    UNABLE TO FIND Take by mouth in the morning, at noon, and at bedtime. palmitoylethanolamide luteolin.     No current facility-administered medications for this visit.    PHYSICAL EXAMINATION: ECOG PERFORMANCE STATUS: 1 - Symptomatic but completely ambulatory  Vitals:   08/18/21 0938  BP: (!) 174/76  Pulse: 91  Resp: 17  Temp: (!) 97.5 F (36.4 C)  SpO2: 98%   Filed Weights   08/18/21 0938  Weight: 195 lb 12.8 oz (88.8 kg)     LABORATORY DATA:  I have reviewed the data as listed    Latest Ref Rng & Units 07/28/2021    9:23 AM 11/11/2020   10:04 AM 10/22/2020   10:31 AM  CMP  Glucose 70 - 99 mg/dL 219  231  212   BUN 8 - 23 mg/dL $Remove'9  9  8   'oobMFle$ Creatinine 0.44 - 1.00 mg/dL 0.72  0.77  0.79   Sodium 135 - 145 mmol/L 130  132  134   Potassium 3.5 - 5.1 mmol/L 4.0  3.0  3.5   Chloride 98 - 111 mmol/L 98  92  98   CO2 22 - 32 mmol/L $RemoveB'25  27  25   'rSlFGopa$ Calcium 8.9 - 10.3 mg/dL 9.3  9.7  10.1   Total Protein 6.5 - 8.1 g/dL 7.2  7.5  7.6   Total Bilirubin 0.3 - 1.2 mg/dL 0.4  0.5  0.4   Alkaline Phos 38 - 126 U/L 92  100  101   AST 15 - 41 U/L $Remo'13  30  28   'ZfxEc$ ALT 0 - 44 U/L 13  36  28     Lab Results  Component Value Date   WBC 6.8 08/18/2021   HGB 12.6 08/18/2021   HCT 37.0 08/18/2021   MCV 92.3 08/18/2021   PLT 352 08/18/2021   NEUTROABS 4.5 08/18/2021    ASSESSMENT & PLAN:  Malignant neoplasm of upper-outer quadrant of right breast in female, estrogen receptor positive ([place]) 08/04/20: Right mastectomy (Dr. 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 Neoadjuvant chemotherapy with TCH Perjeta 6 cycles followed by Kadcyla maintenance discontinued 11/11/2020 because of peripheral neuropathy 2. Followed by mastectomy with sentinel lymph node study 08/04/20 3. Followed by adjuvant radiation therapy  09/18/2020- 11/04/2020 4.  Decided not to do antiestrogen therapy because her ER was 0% on final path. 5.  Consideration for neratinib ----------------------------------------------------------------------------------------------------------------- Right mastectomy area skin changes: Initially treated for shingles but since lesions did not improve skin biopsy was obtained on 07/14/2021: Infiltrating carcinoma consistent with breast origin CK7 and GA TA 3 positive, ER 0%, PR 0%, HER2 2+ by IHC, FISH negative ratio 1.56, copy #2.1  CT CAP 08/03/2021: Solid 0.8 cm right lower lobe lung nodule. Bone scan 08/03/2021: No definite evidence of metastatic disease  Echocardiogram 07/22/2021: EF 60 to 65%   Current treatment: Cycle 2 Enhertu Enhertu toxicities: Fatigue that  lasted for 5 days, hair thinning     Pain related to the tumors in the chest wall: On oxycodone.  She is currently taking gabapentin and Cymbalta.  Pain is slightly better  Lung nodule: Patient does not want to go through bronchoscopy and biopsy at this time.  Return to clinic in 3 weeks for cycle 3 Our plans to do scans after 4 cycles.   Orders Placed This Encounter  Procedures   CBC with Differential ([place] Only)    Standing Status:   Future    Standing Expiration Date:   08/19/2022   CMP ([place] only)    Standing Status:   Future    Standing Expiration Date:   08/19/2022   CBC with Differential ([place] Only)    Standing Status:   Future    Standing Expiration Date:   09/09/2022   CMP ([place] only)    Standing Status:   Future    Standing Expiration Date:   09/09/2022   CBC with Differential ([place] Only)    Standing Status:   Future    Standing Expiration Date:   09/30/2022   CMP ([place] only)    Standing Status:   Future    Standing Expiration Date:   09/30/2022   CBC with Differential ([place] Only)    Standing Status:   Future    Standing Expiration Date:   10/21/2022   CMP  ([place] only)    Standing Status:   Future    Standing Expiration Date:   10/21/2022   CBC with Differential ([place] Only)    Standing Status:   Future    Standing Expiration Date:   11/11/2022   CMP ([place] only)    Standing Status:   Future    Standing Expiration Date:   11/11/2022   The patient has a good understanding of the overall plan. she agrees with it. she will call with any problems that may develop before the next visit here. Total time spent: 30 mins including face to face time and time spent for planning, charting and co-ordination of care   Harriette Ohara, MD 08/18/21    I Gardiner Coins am scribing for Dr. Lindi Adie  I have reviewed the above documentation for accuracy and completeness, and I agree with the above.

## 2021-08-11 ENCOUNTER — Telehealth: Payer: Self-pay | Admitting: Pulmonary Disease

## 2021-08-11 NOTE — Telephone Encounter (Signed)
Message received by Dr. Valeta Harms to see if we are able to get pt in for an appt either today 8/9 or tomorrow 8/10.  Called pt to see if I could schedule her a consult with Dr. Valeta Harms and pt stated that she does not want to schedule an appt at our office at this time. She wants to further discuss this more with her oncologist whom she goes to see next week prior to making an appt at our office. Have made Dr. Valeta Harms aware that she wants to further discuss with oncology. Routing to Dr. Valeta Harms as an Juluis Rainier.

## 2021-08-12 ENCOUNTER — Ambulatory Visit: Payer: Managed Care, Other (non HMO) | Admitting: Hematology and Oncology

## 2021-08-12 ENCOUNTER — Other Ambulatory Visit: Payer: Managed Care, Other (non HMO)

## 2021-08-18 ENCOUNTER — Other Ambulatory Visit: Payer: Self-pay

## 2021-08-18 ENCOUNTER — Inpatient Hospital Stay (HOSPITAL_BASED_OUTPATIENT_CLINIC_OR_DEPARTMENT_OTHER): Payer: Managed Care, Other (non HMO) | Admitting: Hematology and Oncology

## 2021-08-18 ENCOUNTER — Telehealth: Payer: Self-pay | Admitting: *Deleted

## 2021-08-18 ENCOUNTER — Inpatient Hospital Stay: Payer: Managed Care, Other (non HMO)

## 2021-08-18 ENCOUNTER — Inpatient Hospital Stay: Payer: Managed Care, Other (non HMO) | Attending: Hematology and Oncology

## 2021-08-18 VITALS — BP 174/76 | HR 91 | Temp 97.5°F | Resp 17 | Ht 66.0 in | Wt 195.8 lb

## 2021-08-18 VITALS — BP 178/74 | HR 70 | Resp 17

## 2021-08-18 DIAGNOSIS — Z170 Estrogen receptor positive status [ER+]: Secondary | ICD-10-CM | POA: Insufficient documentation

## 2021-08-18 DIAGNOSIS — Z5112 Encounter for antineoplastic immunotherapy: Secondary | ICD-10-CM | POA: Insufficient documentation

## 2021-08-18 DIAGNOSIS — R911 Solitary pulmonary nodule: Secondary | ICD-10-CM | POA: Insufficient documentation

## 2021-08-18 DIAGNOSIS — C50411 Malignant neoplasm of upper-outer quadrant of right female breast: Secondary | ICD-10-CM

## 2021-08-18 DIAGNOSIS — C773 Secondary and unspecified malignant neoplasm of axilla and upper limb lymph nodes: Secondary | ICD-10-CM | POA: Diagnosis not present

## 2021-08-18 DIAGNOSIS — G893 Neoplasm related pain (acute) (chronic): Secondary | ICD-10-CM | POA: Diagnosis not present

## 2021-08-18 DIAGNOSIS — R5383 Other fatigue: Secondary | ICD-10-CM

## 2021-08-18 LAB — CBC WITH DIFFERENTIAL/PLATELET
Abs Immature Granulocytes: 0.05 10*3/uL (ref 0.00–0.07)
Basophils Absolute: 0.1 10*3/uL (ref 0.0–0.1)
Basophils Relative: 2 %
Eosinophils Absolute: 0.3 10*3/uL (ref 0.0–0.5)
Eosinophils Relative: 4 %
HCT: 37 % (ref 36.0–46.0)
Hemoglobin: 12.6 g/dL (ref 12.0–15.0)
Immature Granulocytes: 1 %
Lymphocytes Relative: 19 %
Lymphs Abs: 1.3 10*3/uL (ref 0.7–4.0)
MCH: 31.4 pg (ref 26.0–34.0)
MCHC: 34.1 g/dL (ref 30.0–36.0)
MCV: 92.3 fL (ref 80.0–100.0)
Monocytes Absolute: 0.6 10*3/uL (ref 0.1–1.0)
Monocytes Relative: 9 %
Neutro Abs: 4.5 10*3/uL (ref 1.7–7.7)
Neutrophils Relative %: 65 %
Platelets: 352 10*3/uL (ref 150–400)
RBC: 4.01 MIL/uL (ref 3.87–5.11)
RDW: 13.1 % (ref 11.5–15.5)
WBC: 6.8 10*3/uL (ref 4.0–10.5)
nRBC: 0 % (ref 0.0–0.2)

## 2021-08-18 LAB — COMPREHENSIVE METABOLIC PANEL
ALT: 22 U/L (ref 0–44)
AST: 16 U/L (ref 15–41)
Albumin: 4 g/dL (ref 3.5–5.0)
Alkaline Phosphatase: 92 U/L (ref 38–126)
Anion gap: 5 (ref 5–15)
BUN: 10 mg/dL (ref 8–23)
CO2: 29 mmol/L (ref 22–32)
Calcium: 9.9 mg/dL (ref 8.9–10.3)
Chloride: 103 mmol/L (ref 98–111)
Creatinine, Ser: 0.69 mg/dL (ref 0.44–1.00)
GFR, Estimated: >60 mL/min (ref >60)
Glucose, Bld: 222 mg/dL — ABNORMAL HIGH (ref 70–99)
Potassium: 3.9 mmol/L (ref 3.5–5.1)
Sodium: 137 mmol/L (ref 135–145)
Total Bilirubin: 0.3 mg/dL (ref 0.3–1.2)
Total Protein: 7 g/dL (ref 6.5–8.1)

## 2021-08-18 MED ORDER — ACETAMINOPHEN 325 MG PO TABS
ORAL_TABLET | ORAL | Status: AC
  Filled 2021-08-18: qty 2

## 2021-08-18 MED ORDER — ACETAMINOPHEN 325 MG PO TABS
650.0000 mg | ORAL_TABLET | Freq: Once | ORAL | Status: AC
  Administered 2021-08-18: 650 mg via ORAL

## 2021-08-18 MED ORDER — SODIUM CHLORIDE 0.9 % IV SOLN
10.0000 mg | Freq: Once | INTRAVENOUS | Status: AC
  Administered 2021-08-18: 10 mg via INTRAVENOUS
  Filled 2021-08-18: qty 10

## 2021-08-18 MED ORDER — PALONOSETRON HCL INJECTION 0.25 MG/5ML
INTRAVENOUS | Status: AC
  Filled 2021-08-18: qty 5

## 2021-08-18 MED ORDER — SODIUM CHLORIDE 0.9% FLUSH
10.0000 mL | Freq: Once | INTRAVENOUS | Status: AC
  Administered 2021-08-18: 10 mL

## 2021-08-18 MED ORDER — PALONOSETRON HCL INJECTION 0.25 MG/5ML
0.2500 mg | Freq: Once | INTRAVENOUS | Status: AC
  Administered 2021-08-18: 0.25 mg via INTRAVENOUS

## 2021-08-18 MED ORDER — SODIUM CHLORIDE 0.9% FLUSH
10.0000 mL | INTRAVENOUS | Status: DC | PRN
  Administered 2021-08-18: 10 mL

## 2021-08-18 MED ORDER — FAM-TRASTUZUMAB DERUXTECAN-NXKI CHEMO 100 MG IV SOLR
5.6200 mg/kg | Freq: Once | INTRAVENOUS | Status: AC
  Administered 2021-08-18: 500 mg via INTRAVENOUS
  Filled 2021-08-18: qty 25

## 2021-08-18 MED ORDER — DIPHENHYDRAMINE HCL 25 MG PO CAPS
ORAL_CAPSULE | ORAL | Status: DC
Start: 2021-08-18 — End: 2021-08-18
  Filled 2021-08-18: qty 1

## 2021-08-18 MED ORDER — HEPARIN SOD (PORK) LOCK FLUSH 100 UNIT/ML IV SOLN
500.0000 [IU] | Freq: Once | INTRAVENOUS | Status: AC | PRN
  Administered 2021-08-18: 500 [IU]

## 2021-08-18 MED ORDER — DEXTROSE 5 % IV SOLN: Freq: Once | INTRAVENOUS | Status: AC

## 2021-08-18 MED ORDER — DIPHENHYDRAMINE HCL 25 MG PO CAPS
25.0000 mg | ORAL_CAPSULE | Freq: Once | ORAL | Status: AC
  Administered 2021-08-18: 25 mg via ORAL

## 2021-08-18 NOTE — Assessment & Plan Note (Signed)
08/04/20:Right mastectomy (Dr. 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 Neoadjuvant chemotherapy with TCH Perjeta 6 cycles followed by Kadcyla maintenance discontinued 11/11/2020 because of peripheral neuropathy 2. Followed bymastectomywith sentinel lymph node study8/2/22 3. Followed by adjuvant radiation therapy9/16/2022-11/04/2020 4.Decided not to do antiestrogen therapy because her ER was 0% on final path. 5.Consideration for neratinib ----------------------------------------------------------------------------------------------------------------- Right mastectomy area skin changes: Initially treated for shingles but since lesions did not improve skin biopsy was obtained on 07/14/2021: Infiltrating carcinoma consistent with breast origin CK7 and GA TA 3 positive, ER 0%, PR 0%, HER2 2+ by IHC, FISH negative ratio 1.56, copy #2.1  Discussion: This unfortunately represents recurrence of breast cancer and metastatic breast cancer.   CT CAP 08/03/2021: Solid 0.8 cm right lower lobe lung nodule. Bone scan 08/03/2021: No definite evidence of metastatic disease   Plan: 1. systemic treatment with Enhertu 2.  Port placement,  Echocardiogram 07/22/2021: EF 60 to 65% We may have to treat her peripherally without the port for the first treatment next week.  Return to clinic to start Enhertu. Pain related to the tumors in the chest wall: Sent a prescription for oxycodone.  She is currently taking gabapentin and Cymbalta.

## 2021-08-18 NOTE — Telephone Encounter (Signed)
Received documents from [place] for medical records - sent documents to [facility] through intra office mail

## 2021-08-18 NOTE — Patient Instructions (Signed)
[name]  Discharge Instructions: Thank you for choosing [facility] to provide your oncology and hematology care.   If you have a lab appointment with the [facility], please go directly to the [facility] and check in at the registration area.   Wear comfortable clothing and clothing appropriate for easy access to any Portacath or PICC line.   We strive to give you quality time with your provider. You may need to reschedule your appointment if you arrive late (15 or more minutes).  Arriving late affects you and other patients whose appointments are after yours.  Also, if you miss three or more appointments without notifying the office, you may be dismissed from the clinic at the provider's discretion.      For prescription refill requests, have your pharmacy contact our office and allow 72 hours for refills to be completed.    Today you received the following chemotherapy and/or immunotherapy agents: Enhertu      To help prevent nausea and vomiting after your treatment, we encourage you to take your nausea medication as directed.  BELOW ARE SYMPTOMS THAT SHOULD BE REPORTED IMMEDIATELY: *FEVER GREATER THAN 100.4 F (38 C) OR HIGHER *CHILLS OR SWEATING *NAUSEA AND VOMITING THAT IS NOT CONTROLLED WITH YOUR NAUSEA MEDICATION *UNUSUAL SHORTNESS OF BREATH *UNUSUAL BRUISING OR BLEEDING *URINARY PROBLEMS (pain or burning when urinating, or frequent urination) *BOWEL PROBLEMS (unusual diarrhea, constipation, pain near the anus) TENDERNESS IN MOUTH AND THROAT WITH OR WITHOUT PRESENCE OF ULCERS (sore throat, sores in mouth, or a toothache) UNUSUAL RASH, SWELLING OR PAIN  UNUSUAL VAGINAL DISCHARGE OR ITCHING   Items with * indicate a potential emergency and should be followed up as soon as possible or go to the Emergency Department if any problems should occur.  Please show the CHEMOTHERAPY ALERT CARD or IMMUNOTHERAPY ALERT CARD at check-in to  the Emergency Department and triage nurse.  Should you have questions after your visit or need to cancel or reschedule your appointment, please contact [facility]  Dept: [phone]  and follow the prompts.  Office hours are 8:00 a.m. to 4:30 p.m. Monday - Friday. Please note that voicemails left after 4:00 p.m. may not be returned until the following business day.  We are closed weekends and major holidays. You have access to a nurse at all times for urgent questions. Please call the main number to the clinic Dept: [phone] and follow the prompts.   For any non-urgent questions, you may also contact your provider using MyChart. We now offer e-Visits for anyone 18 and older to request care online for non-urgent symptoms. For details visit [email or web address].   Also download the MyChart app! Go to the app store, search "MyChart", open the app, select [facility], and log in with your MyChart username and password.  Masks are optional in the cancer centers. If you would like for your care team to wear a mask while they are taking care of you, please let them know. You may have one support person who is at least 66 years old accompany you for your appointments. 

## 2021-08-20 NOTE — Telephone Encounter (Signed)
Per OV with Dr. Lindi Adie pt stated that she does not want to have bronch and biopsy performed at this time. Closing encounter.

## 2021-08-27 ENCOUNTER — Telehealth: Payer: Self-pay | Admitting: Hematology and Oncology

## 2021-08-27 NOTE — Telephone Encounter (Signed)
Scheduled appointment per workqueue. Patient is aware of the upcoming appointment.

## 2021-09-01 ENCOUNTER — Other Ambulatory Visit: Payer: Self-pay

## 2021-09-03 ENCOUNTER — Other Ambulatory Visit: Payer: Self-pay

## 2021-09-08 MED FILL — Dexamethasone Sodium Phosphate Inj 100 MG/10ML: INTRAMUSCULAR | Qty: 1 | Status: AC

## 2021-09-09 ENCOUNTER — Encounter: Payer: Self-pay | Admitting: Adult Health

## 2021-09-09 ENCOUNTER — Inpatient Hospital Stay: Payer: Managed Care, Other (non HMO) | Attending: Hematology and Oncology

## 2021-09-09 ENCOUNTER — Inpatient Hospital Stay: Payer: Managed Care, Other (non HMO)

## 2021-09-09 ENCOUNTER — Other Ambulatory Visit: Payer: Self-pay

## 2021-09-09 ENCOUNTER — Inpatient Hospital Stay (HOSPITAL_BASED_OUTPATIENT_CLINIC_OR_DEPARTMENT_OTHER): Payer: Managed Care, Other (non HMO) | Admitting: Adult Health

## 2021-09-09 VITALS — BP 148/70 | HR 80 | Temp 98.7°F | Resp 16

## 2021-09-09 DIAGNOSIS — Z170 Estrogen receptor positive status [ER+]: Secondary | ICD-10-CM | POA: Insufficient documentation

## 2021-09-09 DIAGNOSIS — R911 Solitary pulmonary nodule: Secondary | ICD-10-CM | POA: Insufficient documentation

## 2021-09-09 DIAGNOSIS — C773 Secondary and unspecified malignant neoplasm of axilla and upper limb lymph nodes: Secondary | ICD-10-CM | POA: Diagnosis not present

## 2021-09-09 DIAGNOSIS — Z5112 Encounter for antineoplastic immunotherapy: Secondary | ICD-10-CM | POA: Insufficient documentation

## 2021-09-09 DIAGNOSIS — C50411 Malignant neoplasm of upper-outer quadrant of right female breast: Secondary | ICD-10-CM | POA: Diagnosis not present

## 2021-09-09 DIAGNOSIS — R5383 Other fatigue: Secondary | ICD-10-CM

## 2021-09-09 DIAGNOSIS — C792 Secondary malignant neoplasm of skin: Secondary | ICD-10-CM | POA: Insufficient documentation

## 2021-09-09 DIAGNOSIS — G629 Polyneuropathy, unspecified: Secondary | ICD-10-CM | POA: Diagnosis not present

## 2021-09-09 LAB — CBC WITH DIFFERENTIAL (CANCER CENTER ONLY)
Abs Immature Granulocytes: 0.04 10*3/uL (ref 0.00–0.07)
Basophils Absolute: 0.1 10*3/uL (ref 0.0–0.1)
Basophils Relative: 2 %
Eosinophils Absolute: 0.3 10*3/uL (ref 0.0–0.5)
Eosinophils Relative: 4 %
HCT: 35.2 % — ABNORMAL LOW (ref 36.0–46.0)
Hemoglobin: 12.1 g/dL (ref 12.0–15.0)
Immature Granulocytes: 1 %
Lymphocytes Relative: 24 %
Lymphs Abs: 1.6 10*3/uL (ref 0.7–4.0)
MCH: 32.1 pg (ref 26.0–34.0)
MCHC: 34.4 g/dL (ref 30.0–36.0)
MCV: 93.4 fL (ref 80.0–100.0)
Monocytes Absolute: 0.6 10*3/uL (ref 0.1–1.0)
Monocytes Relative: 10 %
Neutro Abs: 4 10*3/uL (ref 1.7–7.7)
Neutrophils Relative %: 59 %
Platelet Count: 331 10*3/uL (ref 150–400)
RBC: 3.77 MIL/uL — ABNORMAL LOW (ref 3.87–5.11)
RDW: 14.8 % (ref 11.5–15.5)
WBC Count: 6.6 10*3/uL (ref 4.0–10.5)
nRBC: 0 % (ref 0.0–0.2)

## 2021-09-09 LAB — CMP (CANCER CENTER ONLY)
ALT: 25 U/L (ref 0–44)
AST: 21 U/L (ref 15–41)
Albumin: 3.8 g/dL (ref 3.5–5.0)
Alkaline Phosphatase: 87 U/L (ref 38–126)
Anion gap: 6 (ref 5–15)
BUN: 10 mg/dL (ref 8–23)
CO2: 25 mmol/L (ref 22–32)
Calcium: 9.7 mg/dL (ref 8.9–10.3)
Chloride: 106 mmol/L (ref 98–111)
Creatinine: 0.69 mg/dL (ref 0.44–1.00)
GFR, Estimated: >60 mL/min (ref >60)
Glucose, Bld: 218 mg/dL — ABNORMAL HIGH (ref 70–99)
Potassium: 3.8 mmol/L (ref 3.5–5.1)
Sodium: 137 mmol/L (ref 135–145)
Total Bilirubin: 0.3 mg/dL (ref 0.3–1.2)
Total Protein: 6.7 g/dL (ref 6.5–8.1)

## 2021-09-09 MED ORDER — SODIUM CHLORIDE 0.9% FLUSH
10.0000 mL | Freq: Once | INTRAVENOUS | Status: AC
  Administered 2021-09-09: 10 mL

## 2021-09-09 MED ORDER — ACETAMINOPHEN 325 MG PO TABS
650.0000 mg | ORAL_TABLET | Freq: Once | ORAL | Status: AC
  Administered 2021-09-09: 650 mg via ORAL
  Filled 2021-09-09: qty 2

## 2021-09-09 MED ORDER — FAM-TRASTUZUMAB DERUXTECAN-NXKI CHEMO 100 MG IV SOLR
5.6200 mg/kg | Freq: Once | INTRAVENOUS | Status: AC
  Administered 2021-09-09: 500 mg via INTRAVENOUS
  Filled 2021-09-09: qty 25

## 2021-09-09 MED ORDER — HEPARIN SOD (PORK) LOCK FLUSH 100 UNIT/ML IV SOLN
500.0000 [IU] | Freq: Once | INTRAVENOUS | Status: AC | PRN
  Administered 2021-09-09: 500 [IU]

## 2021-09-09 MED ORDER — DIPHENHYDRAMINE HCL 25 MG PO CAPS
25.0000 mg | ORAL_CAPSULE | Freq: Once | ORAL | Status: AC
  Administered 2021-09-09: 25 mg via ORAL
  Filled 2021-09-09: qty 1

## 2021-09-09 MED ORDER — SODIUM CHLORIDE 0.9 % IV SOLN
10.0000 mg | Freq: Once | INTRAVENOUS | Status: AC
  Administered 2021-09-09: 10 mg via INTRAVENOUS
  Filled 2021-09-09: qty 10

## 2021-09-09 MED ORDER — DEXTROSE 5 % IV SOLN: Freq: Once | INTRAVENOUS | Status: AC

## 2021-09-09 MED ORDER — SODIUM CHLORIDE 0.9% FLUSH
10.0000 mL | INTRAVENOUS | Status: DC | PRN
  Administered 2021-09-09: 10 mL

## 2021-09-09 MED ORDER — CYANOCOBALAMIN 1000 MCG/ML IJ SOLN
1000.0000 ug | Freq: Once | INTRAMUSCULAR | Status: AC
  Administered 2021-09-09: 1000 ug via INTRAMUSCULAR
  Filled 2021-09-09: qty 1

## 2021-09-09 MED ORDER — PALONOSETRON HCL INJECTION 0.25 MG/5ML
0.2500 mg | Freq: Once | INTRAVENOUS | Status: AC
  Administered 2021-09-09: 0.25 mg via INTRAVENOUS
  Filled 2021-09-09: qty 5

## 2021-09-09 NOTE — Progress Notes (Signed)
[facility] Follow up:    [name], MD [place] [address]   DIAGNOSIS:  Cancer Staging  Malignant neoplasm of upper-outer quadrant of right breast in female, estrogen receptor positive ([place]) Staging form: Breast, AJCC 8th Edition - Clinical stage from 03/10/2020: Stage IIIA (cT3, cN1, cM0, G3, ER+, PR-, HER2+) - Signed by [name], MD on 03/10/2020 Stage prefix: Initial diagnosis - Pathologic stage from 08/04/2020: No Stage Recommended (ypTis (DCIS), pN2, cM0) - Signed by Gardenia Phlegm, NP on 02/05/2021 Stage prefix: Post-therapy   SUMMARY OF ONCOLOGIC HISTORY: Oncology History  Malignant neoplasm of upper-outer quadrant of right breast in female, estrogen receptor positive ([place])  03/10/2020 Initial Diagnosis   Palpable right breast mass x6 months.  Mammogram revealed left breast cysts (intraductal papilloma), 11 cm right breast mass and 2 masses 2.6 cm each in the right axilla: Biopsy grade 3 IDC ER 5% weak, PR 0%, Ki-67 25%, HER-2 3+ IHC positive   03/10/2020 Cancer Staging   Staging form: Breast, AJCC 8th Edition - Clinical stage from 03/10/2020: Stage IIIA (cT3, cN1, cM0, G3, ER+, PR-, HER2+) - Signed by [name], MD on 03/10/2020 Stage prefix: Initial diagnosis   03/19/2020 - 07/01/2020 Chemotherapy   [facility]   08/04/2020 Surgery   Right mastectomy (Dr. 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, high-grade DCIS with necrosis and calcifications, and 2 right axillary lymph nodes positive for metastatic carcinoma   08/04/2020 Cancer Staging   Staging form: Breast, AJCC 8th Edition - Pathologic stage from 08/04/2020: No Stage Recommended (ypTis (DCIS), pN2, cM0) - Signed by Gardenia Phlegm, NP on 02/05/2021 Stage prefix: Post-therapy   08/21/2020 - 10/23/2020 Chemotherapy   Discontinued due to peripheral neuropathy Patient is on Treatment Plan : BREAST  ADO-Trastuzumab Emtansine (Kadcyla) q21d     08/2020 -  Anti-estrogen oral therapy    Decided not to do antiestrogen therapy because her ER was 0% on final path.   10/06/2020 - 11/04/2020 Radiation Therapy   Adjuvant radiation    Relapse/Recurrence   Right mastectomy area skin changes: Initially treated for shingles but since lesions did not improve skin biopsy was obtained on 07/14/2021: Infiltrating carcinoma consistent with breast origin CK7 and GA TA 3 positive, ER 0%, PR 0%, HER2 2+ by IHC, FISH negative ratio 1.56, copy #2.1   07/26/2021 -  Chemotherapy   Patient is on Treatment Plan : BREAST METASTATIC Fam-Trastuzumab Deruxtecan-nxki (Enhertu) (5.4) q21d     07/28/2021 - 07/28/2021 Chemotherapy   Patient is on Treatment Plan : BREAST METASTATIC fam-trastuzumab deruxtecan-nxki (Enhertu) q21d       CURRENT THERAPY: Enhertu  INTERVAL HISTORY: SABRIYA YONO 66 y.o. female returns for follow up prior to receiving Enhertu.  She is doing well today and is tolerating her treatment well.  She feels fatigued the first few days after treatment, however regains her strength with time.    Her most recent echocardiogram was completed on 07/22/2021 and showed a LVEF of 60-65%.  Her most recent restaging CT scan was completed on 08/03/2021 and showed a new solid 0.8 cm pulmonary nodule concerning for solitary metastasis, and the bone scan was negative.     Patient Active Problem List   Diagnosis Date Noted   Fatigue 04/08/2021   S/P mastectomy, right 08/04/2020   Chemotherapy-induced peripheral neuropathy ([place]) 05/20/2020   Malignant neoplasm of upper-outer quadrant of right breast in female, estrogen receptor positive ([place]) 03/10/2020  Fibroid    Smoker    Depression     has No Known Allergies.  MEDICAL HISTORY: Past Medical History:  Diagnosis Date   Acid reflux    Anxiety    Breast cancer ([place])    Breast cancer ([place]) 2022   Depression    HISTORY OF   Fibroid    PONV  (postoperative nausea and vomiting)    Smoker     SURGICAL HISTORY: Past Surgical History:  Procedure Laterality Date   CESAREAN SECTION     X 2   MASTECTOMY     MASTECTOMY WITH AXILLARY LYMPH NODE DISSECTION Right 08/04/2020   Procedure: RIGHT MASTECTOMY WITH RIGHT AXILLARY LYMPH NODE DISSECTION;  Surgeon: Rolm Bookbinder, MD;  Location: [facility] OR;  Service: General;  Laterality: Right;   PORTACATH PLACEMENT Left 03/17/2020   Procedure: INSERTION PORT-A-CATH;  Surgeon: Rolm Bookbinder, MD;  Location: [facility];  Service: General;  Laterality: Left;   PORTACATH PLACEMENT Left 08/05/2021   Procedure: PORT PLACEMENT WITH ULTRASOUND GUIDANCE;  Surgeon: Rolm Bookbinder, MD;  Location: [facility];  Service: General;  Laterality: Left;  LMA    SOCIAL HISTORY: Social History   Socioeconomic History   Marital status: Married    Spouse name: Not on file   Number of children: Not on file   Years of education: Not on file   Highest education level: Not on file  Occupational History   Not on file  Tobacco Use   Smoking status: Every Day    Packs/day: 0.50    Types: Cigarettes   Smokeless tobacco: Never  Vaping Use   Vaping Use: Never used  Substance and Sexual Activity   Alcohol use: Yes    Comment: occasionally   Drug use: Never   Sexual activity: Yes    Birth control/protection: Post-menopausal  Other Topics Concern   Not on file  Social History Narrative   Right handed    Caffeine- 5-6 cups per day    Social Determinants of Health   Financial Resource Strain: Low Risk  (09/01/2020)   Overall Financial Resource Strain (CARDIA)    Difficulty of Paying Living Expenses: Not hard at all  Food Insecurity: No Food Insecurity (09/01/2020)   Hunger Vital Sign    Worried About Running Out of Food in the Last Year: Never true    [place] in the Last Year: Never true  Transportation Needs: No Transportation Needs (09/01/2020)   PRAPARE -  Hydrologist (Medical): No    Lack of Transportation (Non-Medical): No  Physical Activity: Not on file  Stress: Not on file  Social Connections: Not on file  Intimate Partner Violence: Not on file    FAMILY HISTORY: Family History  Problem Relation Age of Onset   Breast cancer Mother    Diabetes Mother    Hypertension Mother    Hypertension Father    Hypertension Sister    Hypertension Brother     Review of Systems  Constitutional:  Positive for fatigue. Negative for appetite change, chills, fever and unexpected weight change.  HENT:   Negative for hearing loss, lump/mass and trouble swallowing.   Eyes:  Negative for eye problems and icterus.  Respiratory:  Negative for chest tightness, cough and shortness of breath.   Cardiovascular:  Negative for chest pain, leg swelling and palpitations.  Gastrointestinal:  Negative for abdominal distention, abdominal pain, constipation, diarrhea, nausea and vomiting.  Endocrine: Negative for hot  flashes.  Genitourinary:  Negative for difficulty urinating.   Musculoskeletal:  Negative for arthralgias.  Skin:  Negative for itching and rash.  Neurological:  Negative for dizziness, extremity weakness, headaches and numbness.  Hematological:  Negative for adenopathy. Does not bruise/bleed easily.  Psychiatric/Behavioral:  Negative for depression. The patient is not nervous/anxious.       PHYSICAL EXAMINATION  ECOG PERFORMANCE STATUS: 1 - Symptomatic but completely ambulatory  Vitals:   09/09/21 0932  BP: (!) 151/80  Pulse: 79  Resp: 16  Temp: 98.5 F (36.9 C)  SpO2: 98%    Physical Exam Constitutional:      General: She is not in acute distress.    Appearance: Normal appearance. She is not toxic-appearing.  HENT:     Head: Normocephalic and atraumatic.  Eyes:     General: No scleral icterus. Cardiovascular:     Rate and Rhythm: Normal rate and regular rhythm.     Pulses: Normal pulses.     Heart  sounds: Normal heart sounds.  Pulmonary:     Effort: Pulmonary effort is normal.     Breath sounds: Normal breath sounds.  Abdominal:     General: Abdomen is flat. Bowel sounds are normal. There is no distension.     Palpations: Abdomen is soft.     Tenderness: There is no abdominal tenderness.  Musculoskeletal:        General: No swelling.     Cervical back: Neck supple.  Lymphadenopathy:     Cervical: No cervical adenopathy.  Skin:    General: Skin is warm and dry.     Findings: No rash.  Neurological:     General: No focal deficit present.     Mental Status: She is alert.  Psychiatric:        Mood and Affect: Mood normal.        Behavior: Behavior normal.     LABORATORY DATA:  CBC    Component Value Date/Time   WBC 6.6 09/09/2021 0846   WBC 6.8 08/18/2021 0934   RBC 3.77 (L) 09/09/2021 0846   HGB 12.1 09/09/2021 0846   HCT 35.2 (L) 09/09/2021 0846   PLT 331 09/09/2021 0846   MCV 93.4 09/09/2021 0846   MCH 32.1 09/09/2021 0846   MCHC 34.4 09/09/2021 0846   RDW 14.8 09/09/2021 0846   LYMPHSABS 1.6 09/09/2021 0846   MONOABS 0.6 09/09/2021 0846   EOSABS 0.3 09/09/2021 0846   BASOSABS 0.1 09/09/2021 0846    CMP     Component Value Date/Time   NA 137 09/09/2021 0846   K 3.8 09/09/2021 0846   CL 106 09/09/2021 0846   CO2 25 09/09/2021 0846   GLUCOSE 218 (H) 09/09/2021 0846   BUN 10 09/09/2021 0846   CREATININE 0.69 09/09/2021 0846   CALCIUM 9.7 09/09/2021 0846   PROT 6.7 09/09/2021 0846   ALBUMIN 3.8 09/09/2021 0846   AST 21 09/09/2021 0846   ALT 25 09/09/2021 0846   ALKPHOS 87 09/09/2021 0846   BILITOT 0.3 09/09/2021 0846   GFRNONAA >60 09/09/2021 0846      ASSESSMENT and THERAPY PLAN:   Malignant neoplasm of upper-outer quadrant of right breast in female, estrogen receptor positive ([place]) Dot is a 66 year old woman with metastatic breast cancer here today to receive treatment with Enhertu.  She is tolerating treatment well other than some mild  fatigue.    Dot will proceed with treatment today.  Her labs are stable.  I placed orders for  her to undergo repeat echo in October.    Dot will return in 3 weeks for labs, f/u and her next treatment.     All questions were answered. The patient knows to call the clinic with any problems, questions or concerns. We can certainly see the patient much sooner if necessary.  Total encounter time:20 minutes*in face-to-face visit time, chart review, lab review, care coordination, order entry, and documentation of the encounter time.  Wilber Bihari, NP 09/09/21 9:04 PM Medical Oncology and Hematology [facility] [place], [place] [address] Tel. [phone]    Fax. [phone]  *Total Encounter Time as defined by the Centers for Medicare and Medicaid Services includes, in addition to the face-to-face time of a patient visit (documented in the note above) non-face-to-face time: obtaining and reviewing outside history, ordering and reviewing medications, tests or procedures, care coordination (communications with other health care professionals or caregivers) and documentation in the medical record.

## 2021-09-09 NOTE — Assessment & Plan Note (Signed)
Sabrina Woods is a 66 year old woman with metastatic breast cancer here today to receive treatment with Enhertu.  She is tolerating treatment well other than some mild fatigue.    Sabrina Woods will proceed with treatment today.  Her labs are stable.  I placed orders for her to undergo repeat echo in October.    Sabrina Woods will return in 3 weeks for labs, f/u and her next treatment.

## 2021-09-09 NOTE — Patient Instructions (Signed)
[facility] MEDICAL ONCOLOGY  Discharge Instructions: Thank you for choosing [facility] to provide your oncology and hematology care.   If you have a lab appointment with the Cancer Center, please go directly to the Cancer Center and check in at the registration area.   Wear comfortable clothing and clothing appropriate for easy access to any Portacath or PICC line.   We strive to give you quality time with your provider. You may need to reschedule your appointment if you arrive late (15 or more minutes).  Arriving late affects you and other patients whose appointments are after yours.  Also, if you miss three or more appointments without notifying the office, you may be dismissed from the clinic at the provider's discretion.      For prescription refill requests, have your pharmacy contact our office and allow 72 hours for refills to be completed.    Today you received the following chemotherapy and/or immunotherapy agents: Enhertu      To help prevent nausea and vomiting after your treatment, we encourage you to take your nausea medication as directed.  BELOW ARE SYMPTOMS THAT SHOULD BE REPORTED IMMEDIATELY: *FEVER GREATER THAN 100.4 F (38 C) OR HIGHER *CHILLS OR SWEATING *NAUSEA AND VOMITING THAT IS NOT CONTROLLED WITH YOUR NAUSEA MEDICATION *UNUSUAL SHORTNESS OF BREATH *UNUSUAL BRUISING OR BLEEDING *URINARY PROBLEMS (pain or burning when urinating, or frequent urination) *BOWEL PROBLEMS (unusual diarrhea, constipation, pain near the anus) TENDERNESS IN MOUTH AND THROAT WITH OR WITHOUT PRESENCE OF ULCERS (sore throat, sores in mouth, or a toothache) UNUSUAL RASH, SWELLING OR PAIN  UNUSUAL VAGINAL DISCHARGE OR ITCHING   Items with * indicate a potential emergency and should be followed up as soon as possible or go to the Emergency Department if any problems should occur.  Please show the CHEMOTHERAPY ALERT CARD or IMMUNOTHERAPY ALERT CARD at check-in to  the Emergency Department and triage nurse.  Should you have questions after your visit or need to cancel or reschedule your appointment, please contact [facility]ICAL [facility]COL[facility]  Dept: [phone]  and follow the prompts.  Office hours are 8:00 a.m. to 4:30 p.m. Monday - Friday. Please note that voicemails left after 4:00 p.m. may not be returned until the following business day.  We are closed weekends and major holidays. You have access to a nurse at all times for urgent questions. Please call the main number to the clinic Dept: [phone] and follow the prompts.   For any non-urgent questions, you may also contact your provider using MyChart. We now offer e-Visits for anyone 18 and older to request care online for non-urgent symptoms. For details visit [email or web address].   Also download the MyChart app! Go to the app store, search "MyChart", open the app, select [facility], and log in with your MyChart username and password.  Masks are optional in the cancer centers. If you would like for your care team to wear a mask while they are taking care of you, please let them know. You may have one support person who is at least 66 years old accompany you for your appointments. 

## 2021-09-10 ENCOUNTER — Other Ambulatory Visit: Payer: Self-pay

## 2021-09-25 NOTE — Progress Notes (Signed)
Patient Care Team: [name], MD as PCP - General (Hematology and Oncology) [name], MD as Consulting Physician (General Surgery) [name], MD as Consulting Physician (Radiation Oncology)  DIAGNOSIS: No diagnosis found.  SUMMARY OF ONCOLOGIC HISTORY: Oncology History  Malignant neoplasm of upper-outer quadrant of right breast in female, estrogen receptor positive ([facility])  03/10/2020 Initial Diagnosis   Palpable right breast mass x6 months.  Mammogram revealed left breast cysts (intraductal papilloma), 11 cm right breast mass and 2 masses 2.6 cm each in the right axilla: Biopsy grade 3 IDC ER 5% weak, PR 0%, Ki-67 25%, HER-2 3+ IHC positive   03/10/2020 Cancer Staging   Staging form: Breast, AJCC 8th Edition - Clinical stage from 03/10/2020: Stage IIIA (cT3, cN1, cM0, G3, ER+, PR-, HER2+) - Signed by [name], MD on 03/10/2020 Stage prefix: Initial diagnosis   03/19/2020 - 07/01/2020 Chemotherapy   [facility]   08/04/2020 Surgery   Right mastectomy (Dr. 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, high-grade DCIS with necrosis and calcifications, and 2 right axillary lymph nodes positive for metastatic carcinoma   08/04/2020 Cancer Staging   Staging form: Breast, AJCC 8th Edition - Pathologic stage from 08/04/2020: No Stage Recommended (ypTis (DCIS), pN2, cM0) - Signed by Loa Socks, NP on 02/05/2021 Stage prefix: Post-therapy   08/21/2020 - 10/23/2020 Chemotherapy   Discontinued due to peripheral neuropathy Patient is on Treatment Plan : BREAST ADO-Trastuzumab Emtansine (Kadcyla) q21d     08/2020 -  Anti-estrogen oral therapy    Decided not to do antiestrogen therapy because her ER was 0% on final path.   10/06/2020 - 11/04/2020 Radiation Therapy   Adjuvant radiation    Relapse/Recurrence   Right mastectomy area skin changes: Initially treated for shingles but since lesions did not improve skin biopsy was  obtained on 07/14/2021: Infiltrating carcinoma consistent with breast origin CK7 and GA TA 3 positive, ER 0%, PR 0%, HER2 2+ by IHC, FISH negative ratio 1.56, copy #2.1   07/26/2021 -  Chemotherapy   Patient is on Treatment Plan : BREAST METASTATIC Fam-Trastuzumab Deruxtecan-nxki (Enhertu) (5.4) q21d     07/28/2021 - 07/28/2021 Chemotherapy   Patient is on Treatment Plan : BREAST METASTATIC fam-trastuzumab deruxtecan-nxki (Enhertu) q21d       CHIEF COMPLIANT: Cycle 3 Enhertu  INTERVAL HISTORY: Sabrina Woods is a 66 y.o. female with above-mentioned history of right breast cancer. She presents to the clinic today for a follow-up.    ALLERGIES:  has No Known Allergies.  MEDICATIONS:  Current Outpatient Medications  Medication Sig Dispense Refill   ALPRAZolam (XANAX) 0.25 MG tablet Take 1 tablet (0.25 mg total) by mouth 2 (two) times daily as needed for anxiety. 60 tablet 3   dexamethasone (DECADRON) 4 MG tablet TAKE 1 TABLET DAY BEFORE CHEMO AND 1 TABLET DAY AFTER CHEMO WITH FOOD     DULoxetine (CYMBALTA) 20 MG capsule Take 20 mg by mouth daily. (Patient not taking: Reported on 09/09/2021)     DULoxetine 40 MG CPEP Take 40 mg by mouth daily. (Patient not taking: Reported on 09/09/2021) 90 capsule 3   esomeprazole (NEXIUM) 20 MG capsule Take 20 mg by mouth daily.     gabapentin (NEURONTIN) 600 MG tablet Take 1 tablet (600 mg total) by mouth 3 (three) times daily. 90 tablet 3   lidocaine-prilocaine (EMLA) cream APPLY TO AFFECTED AREA ONCE AS DIRECTED     loratadine (CLARITIN) 10 MG tablet Take 10 mg by  mouth daily as needed for rhinitis.     Multiple Vitamin (MULTIVITAMIN) capsule Take 1 capsule by mouth daily.     nicotine (NICODERM CQ - DOSED IN MG/24 HR) 7 mg/24hr patch PLACE 1 PATCH (7 MG TOTAL) ONTO THE SKIN DAILY.     oxyCODONE-acetaminophen (PERCOCET/ROXICET) 5-325 MG tablet Take 1 tablet by mouth every 8 (eight) hours as needed for severe pain. 60 tablet 0   UNABLE TO FIND Take by mouth  in the morning, at noon, and at bedtime. palmitoylethanolamide luteolin.     No current facility-administered medications for this visit.    PHYSICAL EXAMINATION: ECOG PERFORMANCE STATUS: {CHL ONC ECOG [phone]}  There were no vitals filed for this visit. There were no vitals filed for this visit.  BREAST:*** No palpable masses or nodules in either right or left breasts. No palpable axillary supraclavicular or infraclavicular adenopathy no breast tenderness or nipple discharge. (exam performed in the presence of a chaperone)  LABORATORY DATA:  I have reviewed the data as listed    Latest Ref Rng & Units 09/09/2021    8:46 AM 08/18/2021    9:34 AM 07/28/2021    9:23 AM  CMP  Glucose 70 - 99 mg/dL 218  222  219   BUN 8 - 23 mg/dL $Remove'10  10  9   'RMlZjxC$ Creatinine 0.44 - 1.00 mg/dL 0.69  0.69  0.72   Sodium 135 - 145 mmol/L 137  137  130   Potassium 3.5 - 5.1 mmol/L 3.8  3.9  4.0   Chloride 98 - 111 mmol/L 106  103  98   CO2 22 - 32 mmol/L $RemoveB'25  29  25   'VDFYtJNP$ Calcium 8.9 - 10.3 mg/dL 9.7  9.9  9.3   Total Protein 6.5 - 8.1 g/dL 6.7  7.0  7.2   Total Bilirubin 0.3 - 1.2 mg/dL 0.3  0.3  0.4   Alkaline Phos 38 - 126 U/L 87  92  92   AST 15 - 41 U/L $Remo'21  16  13   'YZoMc$ ALT 0 - 44 U/L $Remo'25  22  13     'ujJXz$ Lab Results  Component Value Date   WBC 6.6 09/09/2021   HGB 12.1 09/09/2021   HCT 35.2 (L) 09/09/2021   MCV 93.4 09/09/2021   PLT 331 09/09/2021   NEUTROABS 4.0 09/09/2021    ASSESSMENT & PLAN:  No problem-specific Assessment & Plan notes found for this encounter.    No orders of the defined types were placed in this encounter.  The patient has a good understanding of the overall plan. she agrees with it. she will call with any problems that may develop before the next visit here. Total time spent: 30 mins including face to face time and time spent for planning, charting and co-ordination of care   Suzzette Righter, [place] 09/25/21    I Gardiner Coins am scribing for Dr. Lindi Adie  ***

## 2021-09-27 ENCOUNTER — Ambulatory Visit: Payer: Managed Care, Other (non HMO) | Attending: Adult Health

## 2021-09-27 VITALS — Wt 195.1 lb

## 2021-09-27 DIAGNOSIS — Z483 Aftercare following surgery for neoplasm: Secondary | ICD-10-CM | POA: Insufficient documentation

## 2021-09-27 NOTE — Therapy (Addendum)
OUTPATIENT PHYSICAL THERAPY SOZO SCREENING NOTE   Patient Name: Sabrina Woods MRN: [id number] DOB:[birth year], 66 y.o., female Today's Date: 09/27/2021  PCP: [name], MD REFERRING PROVIDER: [name], MD   PT End of Session - 09/27/21 1620     Visit Number 13   # unchanged due to screen only   PT Start Time 1618    PT Stop Time 1625    PT Time Calculation (min) 7 min    Activity Tolerance Patient tolerated treatment well    Behavior During Therapy [facility] for tasks assessed/performed             Past Medical History:  Diagnosis Date   Acid reflux    Anxiety    Breast cancer ([place])    Breast cancer ([place]) 2022   Depression    HISTORY OF   Fibroid    PONV (postoperative nausea and vomiting)    Smoker    Past Surgical History:  Procedure Laterality Date   CESAREAN SECTION     X 2   MASTECTOMY     MASTECTOMY WITH AXILLARY LYMPH NODE DISSECTION Right 08/04/2020   Procedure: RIGHT MASTECTOMY WITH RIGHT AXILLARY LYMPH NODE DISSECTION;  Surgeon: Rolm Bookbinder, MD;  Location: [place];  Service: General;  Laterality: Right;   PORTACATH PLACEMENT Left 03/17/2020   Procedure: INSERTION PORT-A-CATH;  Surgeon: Rolm Bookbinder, MD;  Location: [place];  Service: General;  Laterality: Left;   PORTACATH PLACEMENT Left 08/05/2021   Procedure: PORT PLACEMENT WITH ULTRASOUND GUIDANCE;  Surgeon: Rolm Bookbinder, MD;  Location: [place];  Service: General;  Laterality: Left;  LMA   Patient Active Problem List   Diagnosis Date Noted   Fatigue 04/08/2021   S/P mastectomy, right 08/04/2020   Chemotherapy-induced peripheral neuropathy ([place]) 05/20/2020   Malignant neoplasm of upper-outer quadrant of right breast in female, estrogen receptor positive ([place]) 03/10/2020   Fibroid    Smoker    Depression     REFERRING DIAG: right breast cancer at risk for lymphedema  THERAPY DIAG: Aftercare following surgery for neoplasm  PERTINENT  HISTORY: R breast cancer s/p R mastectomy on 08/04/20 with 2 R positive axillary nodes, high grade DCIS. Has completed chemo as of Oct 2022 and radiation 11/04/20  PRECAUTIONS: right UE Lymphedema risk, None  SUBJECTIVE: Pt returns for her first 3 month L-Dex screen.   PAIN:  Are you having pain? No  SOZO SCREENING:  Patient was assessed today using the SOZO machine to determine the lymphedema index score. This was compared to her baseline score. It was determined that she is NOT within the recommended range when compared to her baseline and so she was fitted for a compression garment while in the clinic today. It is recommended she return in 1 month to be reassessed. If she continues to measure outside the recommended range, physical therapy treatment will be recommended at that time and a referral requested.   L-DEX FLOWSHEETS - 09/27/21 1700       L-DEX LYMPHEDEMA SCREENING   Measurement Type Unilateral    L-DEX MEASUREMENT EXTREMITY Upper Extremity    POSITION  Standing    DOMINANT SIDE Right    At Risk Side Right    BASELINE SCORE (UNILATERAL) -3.5    L-DEX SCORE (UNILATERAL) 4.2    VALUE CHANGE (UNILAT) 7.7              Otelia Limes, PTA 09/27/2021, 5:23 PM   Pt education  PLEASE KEEP  YOUR COMPRESSION GARMENT ON DURING THE DAY TO GET THE BEST SWELLING REDUCTION. HERE ARE SOME ADDITIONAL TIPS: Do not sleep in your garment. If you have pain or notice swelling in your hand or at the top of your shoulder, call your therapist. This may be a sign that you need a different garment. 3.  Take good care of your garment so it lasts longer: Follow washing instructions on your garment label or box. Wash periodically using a mild detergent in warm water.  Do not use fabric softener or bleach.  Place garment in a mesh lingerie bag and use the gentle cycle of the washing machine or hand wash. Tumble dry low or lay flat to dry. TAKE CARE OF YOUR SKIN Apply a low pH  moisturizing lotion to your skin daily Avoid scratching your skin Treat skin irritations quickly  Know the 5 warning signs of infection: redness, pain, warmth to touch, fever and increased swelling.  Call your physician immediately if you notice any of these signs of a possible infection.

## 2021-09-27 NOTE — Patient Instructions (Signed)
PLEASE KEEP YOUR COMPRESSION GARMENT ON DURING THE DAY TO GET THE BEST SWELLING REDUCTION. HERE ARE SOME ADDITIONAL TIPS: Do not sleep in your garment. If you have pain or notice swelling in your hand or at the top of your shoulder, call your therapist. This may be a sign that you need a different garment. 3.  Take good care of your garment so it lasts longer: Follow washing instructions on your garment label or box. Wash periodically using a mild detergent in warm water.  Do not use fabric softener or bleach.  Place garment in a mesh lingerie bag and use the gentle cycle of the washing machine or hand wash. Tumble dry low or lay flat to dry. TAKE CARE OF YOUR SKIN Apply a low pH moisturizing lotion to your skin daily Avoid scratching your skin Treat skin irritations quickly  Know the 5 warning signs of infection: redness, pain, warmth to touch, fever and increased swelling.  Call your physician immediately if you notice any of these signs of a possible infection.  

## 2021-09-28 ENCOUNTER — Other Ambulatory Visit: Payer: Self-pay

## 2021-09-28 MED FILL — Dexamethasone Sodium Phosphate Inj 100 MG/10ML: INTRAMUSCULAR | Qty: 1 | Status: AC

## 2021-09-29 ENCOUNTER — Other Ambulatory Visit: Payer: Self-pay | Admitting: *Deleted

## 2021-09-29 ENCOUNTER — Inpatient Hospital Stay: Payer: Managed Care, Other (non HMO) | Admitting: Licensed Clinical Social Worker

## 2021-09-29 ENCOUNTER — Inpatient Hospital Stay: Payer: Managed Care, Other (non HMO)

## 2021-09-29 ENCOUNTER — Other Ambulatory Visit: Payer: Self-pay

## 2021-09-29 ENCOUNTER — Inpatient Hospital Stay (HOSPITAL_BASED_OUTPATIENT_CLINIC_OR_DEPARTMENT_OTHER): Payer: Managed Care, Other (non HMO) | Admitting: Hematology and Oncology

## 2021-09-29 VITALS — BP 152/77 | HR 74 | Resp 16

## 2021-09-29 DIAGNOSIS — Z5112 Encounter for antineoplastic immunotherapy: Secondary | ICD-10-CM | POA: Diagnosis not present

## 2021-09-29 DIAGNOSIS — R5383 Other fatigue: Secondary | ICD-10-CM

## 2021-09-29 DIAGNOSIS — C50411 Malignant neoplasm of upper-outer quadrant of right female breast: Secondary | ICD-10-CM

## 2021-09-29 DIAGNOSIS — Z170 Estrogen receptor positive status [ER+]: Secondary | ICD-10-CM

## 2021-09-29 DIAGNOSIS — Z79899 Other long term (current) drug therapy: Secondary | ICD-10-CM

## 2021-09-29 LAB — CBC WITH DIFFERENTIAL (CANCER CENTER ONLY)
Abs Immature Granulocytes: 0.02 10*3/uL (ref 0.00–0.07)
Basophils Absolute: 0.1 10*3/uL (ref 0.0–0.1)
Basophils Relative: 2 %
Eosinophils Absolute: 0.2 10*3/uL (ref 0.0–0.5)
Eosinophils Relative: 4 %
HCT: 36 % (ref 36.0–46.0)
Hemoglobin: 12.3 g/dL (ref 12.0–15.0)
Immature Granulocytes: 0 %
Lymphocytes Relative: 20 %
Lymphs Abs: 1.2 10*3/uL (ref 0.7–4.0)
MCH: 32.8 pg (ref 26.0–34.0)
MCHC: 34.2 g/dL (ref 30.0–36.0)
MCV: 96 fL (ref 80.0–100.0)
Monocytes Absolute: 0.6 10*3/uL (ref 0.1–1.0)
Monocytes Relative: 9 %
Neutro Abs: 3.9 10*3/uL (ref 1.7–7.7)
Neutrophils Relative %: 65 %
Platelet Count: 282 10*3/uL (ref 150–400)
RBC: 3.75 MIL/uL — ABNORMAL LOW (ref 3.87–5.11)
RDW: 16 % — ABNORMAL HIGH (ref 11.5–15.5)
WBC Count: 6 10*3/uL (ref 4.0–10.5)
nRBC: 0 % (ref 0.0–0.2)

## 2021-09-29 LAB — CMP (CANCER CENTER ONLY)
ALT: 33 U/L (ref 0–44)
AST: 28 U/L (ref 15–41)
Albumin: 3.9 g/dL (ref 3.5–5.0)
Alkaline Phosphatase: 100 U/L (ref 38–126)
Anion gap: 7 (ref 5–15)
BUN: 11 mg/dL (ref 8–23)
CO2: 26 mmol/L (ref 22–32)
Calcium: 9.3 mg/dL (ref 8.9–10.3)
Chloride: 105 mmol/L (ref 98–111)
Creatinine: 0.73 mg/dL (ref 0.44–1.00)
GFR, Estimated: >60 mL/min (ref >60)
Glucose, Bld: 251 mg/dL — ABNORMAL HIGH (ref 70–99)
Potassium: 3.8 mmol/L (ref 3.5–5.1)
Sodium: 138 mmol/L (ref 135–145)
Total Bilirubin: 0.4 mg/dL (ref 0.3–1.2)
Total Protein: 6.9 g/dL (ref 6.5–8.1)

## 2021-09-29 MED ORDER — SODIUM CHLORIDE 0.9% FLUSH
10.0000 mL | Freq: Once | INTRAVENOUS | Status: AC
  Administered 2021-09-29: 10 mL

## 2021-09-29 MED ORDER — PROCHLORPERAZINE MALEATE 10 MG PO TABS: 10.0000 mg | ORAL_TABLET | Freq: Four times a day (QID) | ORAL | 1 refills | Status: DC | PRN

## 2021-09-29 MED ORDER — DEXTROSE 5 % IV SOLN: Freq: Once | INTRAVENOUS | Status: AC

## 2021-09-29 MED ORDER — PALONOSETRON HCL INJECTION 0.25 MG/5ML
0.2500 mg | Freq: Once | INTRAVENOUS | Status: AC
  Administered 2021-09-29: 0.25 mg via INTRAVENOUS
  Filled 2021-09-29: qty 5

## 2021-09-29 MED ORDER — ACETAMINOPHEN 325 MG PO TABS
650.0000 mg | ORAL_TABLET | Freq: Once | ORAL | Status: AC
  Administered 2021-09-29: 650 mg via ORAL
  Filled 2021-09-29: qty 2

## 2021-09-29 MED ORDER — SODIUM CHLORIDE 0.9% FLUSH
10.0000 mL | INTRAVENOUS | Status: DC | PRN
  Administered 2021-09-29: 10 mL

## 2021-09-29 MED ORDER — DIPHENHYDRAMINE HCL 25 MG PO CAPS
25.0000 mg | ORAL_CAPSULE | Freq: Once | ORAL | Status: AC
  Administered 2021-09-29: 25 mg via ORAL
  Filled 2021-09-29: qty 1

## 2021-09-29 MED ORDER — HEPARIN SOD (PORK) LOCK FLUSH 100 UNIT/ML IV SOLN
500.0000 [IU] | Freq: Once | INTRAVENOUS | Status: AC | PRN
  Administered 2021-09-29: 500 [IU]

## 2021-09-29 MED ORDER — CYANOCOBALAMIN 1000 MCG/ML IJ SOLN
1000.0000 ug | Freq: Once | INTRAMUSCULAR | Status: AC
  Administered 2021-09-29: 1000 ug via INTRAMUSCULAR
  Filled 2021-09-29: qty 1

## 2021-09-29 MED ORDER — SODIUM CHLORIDE 0.9 % IV SOLN
10.0000 mg | Freq: Once | INTRAVENOUS | Status: AC
  Administered 2021-09-29: 10 mg via INTRAVENOUS
  Filled 2021-09-29: qty 10

## 2021-09-29 MED ORDER — FAM-TRASTUZUMAB DERUXTECAN-NXKI CHEMO 100 MG IV SOLR
4.5000 mg/kg | Freq: Once | INTRAVENOUS | Status: AC
  Administered 2021-09-29: 400 mg via INTRAVENOUS
  Filled 2021-09-29: qty 20

## 2021-09-29 NOTE — Progress Notes (Signed)
Sabrina [name]  Clinical Social [name] was referred by Marine scientist for questions about insurance.  Clinical Social Worker contacted patient by phone  to offer support and assess for needs and patient asked CSW to speak with her spouse, [name]. Per [name], pt has had insurance through [name] but, as she has not been able to return, coverage is ending. Options are to use [employer] for the rest of the year or switch to another plan. They are concerned about maintaining the best level of coverage for pt's care. CSW answered basic questions on insurance and referred pt/ spouse to call [employer] through [employer] to be able to speak more in-depth about Medicare plan options and coverage. No other needs at this time.        [place], [place] Worker [employer]

## 2021-09-29 NOTE — Assessment & Plan Note (Addendum)
08/04/20:Right mastectomy (Dr. 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 Neoadjuvant chemotherapy with TCH Perjeta 6 cycles followed by Kadcyla maintenance discontinued 11/11/2020 because of peripheral neuropathy 2. Followed bymastectomywith sentinel lymph node study8/2/22 3. Followed by adjuvant radiation therapy9/16/2022-11/04/2020 4.Decided not to do antiestrogen therapy because her ER was 0% on final path. 5.Consideration for neratinib ----------------------------------------------------------------------------------------------------------------- Right mastectomy area skin changes: Initially treated for shingles but since lesions did not improve skin biopsy was obtained on 07/14/2021: Infiltrating carcinoma consistent with breast origin CK7 and GA TA 3 positive, ER 0%, PR 0%, HER2 2+ by IHC, FISH negative ratio 1.56, copy #2.1  CT CAP 08/03/2021: Solid 0.8 cm right lower lobe lung nodule. Bone scan 08/03/2021: No definite evidence of metastatic disease Echocardiogram 07/22/2021: EF 60 to 65%  Current treatment: Cycle 3 Enhertu Enhertu toxicities: Fatigue that lasted for 5 days, hair thinning 2. nausea: Takes: Zofran and Compazine 3.  Insomnia 4.  Worsening neuropathy  Because of this we will reduce the dosage of Enhertu today.   Pain related to the tumors in the chest wall: On oxycodone. She is currently taking gabapenti.  Pain is slightly better.  She stopped Cymbalta.  Lung nodule: Patient does not want to go through bronchoscopy and biopsy at this time.  Return to clinic in 3 weeks for cycle 4 Our plans to do scans after 4 cycles.

## 2021-09-29 NOTE — Patient Instructions (Signed)
[facility] MEDICAL ONCOLOGY  Discharge Instructions: Thank you for choosing [facility] to provide your oncology and hematology care.   If you have a lab appointment with the Cancer Center, please go directly to the Cancer Center and check in at the registration area.   Wear comfortable clothing and clothing appropriate for easy access to any Portacath or PICC line.   We strive to give you quality time with your provider. You may need to reschedule your appointment if you arrive late (15 or more minutes).  Arriving late affects you and other patients whose appointments are after yours.  Also, if you miss three or more appointments without notifying the office, you may be dismissed from the clinic at the provider's discretion.      For prescription refill requests, have your pharmacy contact our office and allow 72 hours for refills to be completed.    Today you received the following chemotherapy and/or immunotherapy agents: Enhertu      To help prevent nausea and vomiting after your treatment, we encourage you to take your nausea medication as directed.  BELOW ARE SYMPTOMS THAT SHOULD BE REPORTED IMMEDIATELY: *FEVER GREATER THAN 100.4 F (38 C) OR HIGHER *CHILLS OR SWEATING *NAUSEA AND VOMITING THAT IS NOT CONTROLLED WITH YOUR NAUSEA MEDICATION *UNUSUAL SHORTNESS OF BREATH *UNUSUAL BRUISING OR BLEEDING *URINARY PROBLEMS (pain or burning when urinating, or frequent urination) *BOWEL PROBLEMS (unusual diarrhea, constipation, pain near the anus) TENDERNESS IN MOUTH AND THROAT WITH OR WITHOUT PRESENCE OF ULCERS (sore throat, sores in mouth, or a toothache) UNUSUAL RASH, SWELLING OR PAIN  UNUSUAL VAGINAL DISCHARGE OR ITCHING   Items with * indicate a potential emergency and should be followed up as soon as possible or go to the Emergency Department if any problems should occur.  Please show the CHEMOTHERAPY ALERT CARD or IMMUNOTHERAPY ALERT CARD at check-in to  the Emergency Department and triage nurse.  Should you have questions after your visit or need to cancel or reschedule your appointment, please contact [facility]ICAL [facility]COL[facility]  Dept: [phone]  and follow the prompts.  Office hours are 8:00 a.m. to 4:30 p.m. Monday - Friday. Please note that voicemails left after 4:00 p.m. may not be returned until the following business day.  We are closed weekends and major holidays. You have access to a nurse at all times for urgent questions. Please call the main number to the clinic Dept: [phone] and follow the prompts.   For any non-urgent questions, you may also contact your provider using MyChart. We now offer e-Visits for anyone 18 and older to request care online for non-urgent symptoms. For details visit [email or web address].   Also download the MyChart app! Go to the app store, search "MyChart", open the app, select [place], and log in with your MyChart username and password.  Masks are optional in the cancer centers. If you would like for your care team to wear a mask while they are taking care of you, please let them know. You may have one support person who is at least 66 years old accompany you for your appointments. 

## 2021-09-29 NOTE — Patient Instructions (Signed)

## 2021-10-08 ENCOUNTER — Ambulatory Visit (HOSPITAL_COMMUNITY)
Admission: RE | Admit: 2021-10-08 | Discharge: 2021-10-08 | Disposition: A | Discharge status: Home / Self Care | Payer: Medicare Other | Source: Ambulatory Visit | Attending: Adult Health | Admitting: Adult Health

## 2021-10-08 ENCOUNTER — Other Ambulatory Visit (HOSPITAL_COMMUNITY): Payer: Managed Care, Other (non HMO)

## 2021-10-08 DIAGNOSIS — Z5181 Encounter for therapeutic drug level monitoring: Secondary | ICD-10-CM | POA: Diagnosis not present

## 2021-10-08 DIAGNOSIS — Z0189 Encounter for other specified special examinations: Secondary | ICD-10-CM | POA: Diagnosis not present

## 2021-10-08 DIAGNOSIS — I5030 Unspecified diastolic (congestive) heart failure: Secondary | ICD-10-CM

## 2021-10-08 DIAGNOSIS — I517 Cardiomegaly: Secondary | ICD-10-CM | POA: Insufficient documentation

## 2021-10-08 DIAGNOSIS — Z79899 Other long term (current) drug therapy: Secondary | ICD-10-CM | POA: Diagnosis not present

## 2021-10-08 LAB — ECHOCARDIOGRAM COMPLETE
AR max vel: 2.79 cm2
AV Peak grad: 8.8 mmHg
Ao pk vel: 1.49 m/s
Area-P 1/2: 3.3 cm2
S' Lateral: 2.6 cm

## 2021-10-18 NOTE — Progress Notes (Signed)
Patient Care Team: [name], MD as PCP - General (Hematology and Oncology) Rolm Bookbinder, MD as Consulting Physician (General Surgery) Kyung Rudd, MD as Consulting Physician (Radiation Oncology)  DIAGNOSIS: No diagnosis found.  SUMMARY OF ONCOLOGIC HISTORY: Oncology History  Malignant neoplasm of upper-outer quadrant of right breast in female, estrogen receptor positive ([place])  03/10/2020 Initial Diagnosis   Palpable right breast mass x6 months.  Mammogram revealed left breast cysts (intraductal papilloma), 11 cm right breast mass and 2 masses 2.6 cm each in the right axilla: Biopsy grade 3 IDC ER 5% weak, PR 0%, Ki-67 25%, HER-2 3+ IHC positive   03/10/2020 Cancer Staging   Staging form: Breast, AJCC 8th Edition - Clinical stage from 03/10/2020: Stage IIIA (cT3, cN1, cM0, G3, ER+, PR-, HER2+) - Signed by [name], MD on 03/10/2020 Stage prefix: Initial diagnosis   03/19/2020 - 07/01/2020 Chemotherapy   TCH Perjeta   08/04/2020 Surgery   Right mastectomy (Dr. 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, high-grade DCIS with necrosis and calcifications, and 2 right axillary lymph nodes positive for metastatic carcinoma   08/04/2020 Cancer Staging   Staging form: Breast, AJCC 8th Edition - Pathologic stage from 08/04/2020: No Stage Recommended (ypTis (DCIS), pN2, cM0) - Signed by Gardenia Phlegm, NP on 02/05/2021 Stage prefix: Post-therapy   08/21/2020 - 10/23/2020 Chemotherapy   Discontinued due to peripheral neuropathy Patient is on Treatment Plan : BREAST ADO-Trastuzumab Emtansine (Kadcyla) q21d      08/2020 -  Anti-estrogen oral therapy    Decided not to do antiestrogen therapy because her ER was 0% on final path.   10/06/2020 - 11/04/2020 Radiation Therapy   Adjuvant radiation    Relapse/Recurrence   Right mastectomy area skin changes: Initially treated for shingles but since lesions did not improve skin biopsy was  obtained on 07/14/2021: Infiltrating carcinoma consistent with breast origin CK7 and GA TA 3 positive, ER 0%, PR 0%, HER2 2+ by IHC, FISH negative ratio 1.56, copy #2.1   07/26/2021 -  Chemotherapy   Patient is on Treatment Plan : BREAST METASTATIC Fam-Trastuzumab Deruxtecan-nxki (Enhertu) (5.4) q21d     07/28/2021 - 07/28/2021 Chemotherapy   Patient is on Treatment Plan : BREAST METASTATIC fam-trastuzumab deruxtecan-nxki (Enhertu) q21d       CHIEF COMPLIANT: Cycle 4 Enhertu  INTERVAL HISTORY: Sabrina Woods is a  66 y.o. female with above-mentioned history of right breast cancer. She presents to the clinic today for a follow-up   ALLERGIES:  has No Known Allergies.  MEDICATIONS:  Current Outpatient Medications  Medication Sig Dispense Refill   ALPRAZolam (XANAX) 0.25 MG tablet Take 1 tablet (0.25 mg total) by mouth 2 (two) times daily as needed for anxiety. 60 tablet 3   DULoxetine (CYMBALTA) 20 MG capsule Take 20 mg by mouth daily. (Patient not taking: Reported on 09/09/2021)     DULoxetine 40 MG CPEP Take 40 mg by mouth daily. (Patient not taking: Reported on 09/09/2021) 90 capsule 3   esomeprazole (NEXIUM) 20 MG capsule Take 20 mg by mouth daily.     gabapentin (NEURONTIN) 600 MG tablet Take 1 tablet (600 mg total) by mouth 3 (three) times daily. 90 tablet 3   lidocaine-prilocaine (EMLA) cream APPLY TO AFFECTED AREA ONCE AS DIRECTED     loratadine (CLARITIN) 10 MG tablet Take 10 mg by mouth daily as needed for rhinitis.     Multiple Vitamin (MULTIVITAMIN) capsule Take 1 capsule by mouth daily.  nicotine (NICODERM CQ - DOSED IN MG/24 HR) 7 mg/24hr patch PLACE 1 PATCH (7 MG TOTAL) ONTO THE SKIN DAILY.     ondansetron (ZOFRAN) 24 MG tablet Take 24 mg by mouth once.     oxyCODONE-acetaminophen (PERCOCET/ROXICET) 5-325 MG tablet Take 1 tablet by mouth every 8 (eight) hours as needed for severe pain. 60 tablet 0   prochlorperazine (COMPAZINE) 10 MG tablet Take 1 tablet (10 mg total) by  mouth every 6 (six) hours as needed (Nausea or vomiting). 30 tablet 1   UNABLE TO FIND Take by mouth in the morning, at noon, and at bedtime. palmitoylethanolamide luteolin.     No current facility-administered medications for this visit.    PHYSICAL EXAMINATION: ECOG PERFORMANCE STATUS: {CHL ONC ECOG PS:[phone]}  There were no vitals filed for this visit. There were no vitals filed for this visit.  BREAST:*** No palpable masses or nodules in either right or left breasts. No palpable axillary supraclavicular or infraclavicular adenopathy no breast tenderness or nipple discharge. (exam performed in the presence of a chaperone)  LABORATORY DATA:  I have reviewed the data as listed    Latest Ref Rng & Units 09/29/2021    9:17 AM 09/09/2021    8:46 AM 08/18/2021    9:34 AM  CMP  Glucose 70 - 99 mg/dL 251  218  222   BUN 8 - 23 mg/dL $Remove'11  10  10   'JfiszwK$ Creatinine 0.44 - 1.00 mg/dL 0.73  0.69  0.69   Sodium 135 - 145 mmol/L 138  137  137   Potassium 3.5 - 5.1 mmol/L 3.8  3.8  3.9   Chloride 98 - 111 mmol/L 105  106  103   CO2 22 - 32 mmol/L $RemoveB'26  25  29   'KEqsFyyV$ Calcium 8.9 - 10.3 mg/dL 9.3  9.7  9.9   Total Protein 6.5 - 8.1 g/dL 6.9  6.7  7.0   Total Bilirubin 0.3 - 1.2 mg/dL 0.4  0.3  0.3   Alkaline Phos 38 - 126 U/L 100  87  92   AST 15 - 41 U/L $Remo'28  21  16   'ixQyX$ ALT 0 - 44 U/L 33  25  22     Lab Results  Component Value Date   WBC 6.0 09/29/2021   HGB 12.3 09/29/2021   HCT 36.0 09/29/2021   MCV 96.0 09/29/2021   PLT 282 09/29/2021   NEUTROABS 3.9 09/29/2021    ASSESSMENT & PLAN:  No problem-specific Assessment & Plan notes found for this encounter.    No orders of the defined types were placed in this encounter.  The patient has a good understanding of the overall plan. she agrees with it. she will call with any problems that may develop before the next visit here. Total time spent: 30 mins including face to face time and time spent for planning, charting and co-ordination of  care   Suzzette Righter, [place] 10/18/21    .dsi

## 2021-10-19 MED FILL — Dexamethasone Sodium Phosphate Inj 100 MG/10ML: INTRAMUSCULAR | Qty: 1 | Status: AC

## 2021-10-20 ENCOUNTER — Inpatient Hospital Stay: Payer: Medicare Other | Attending: Hematology and Oncology | Admitting: Hematology and Oncology

## 2021-10-20 ENCOUNTER — Inpatient Hospital Stay: Payer: Medicare Other

## 2021-10-20 VITALS — BP 130/95 | HR 93 | Temp 97.2°F | Resp 18 | Ht 66.0 in | Wt 198.1 lb

## 2021-10-20 DIAGNOSIS — C50411 Malignant neoplasm of upper-outer quadrant of right female breast: Secondary | ICD-10-CM

## 2021-10-20 DIAGNOSIS — Z170 Estrogen receptor positive status [ER+]: Secondary | ICD-10-CM | POA: Diagnosis not present

## 2021-10-20 DIAGNOSIS — R5383 Other fatigue: Secondary | ICD-10-CM

## 2021-10-20 DIAGNOSIS — Z5112 Encounter for antineoplastic immunotherapy: Secondary | ICD-10-CM | POA: Insufficient documentation

## 2021-10-20 DIAGNOSIS — C773 Secondary and unspecified malignant neoplasm of axilla and upper limb lymph nodes: Secondary | ICD-10-CM | POA: Insufficient documentation

## 2021-10-20 LAB — CMP (CANCER CENTER ONLY)
ALT: 34 U/L (ref 0–44)
AST: 28 U/L (ref 15–41)
Albumin: 3.8 g/dL (ref 3.5–5.0)
Alkaline Phosphatase: 103 U/L (ref 38–126)
Anion gap: 7 (ref 5–15)
BUN: 9 mg/dL (ref 8–23)
CO2: 25 mmol/L (ref 22–32)
Calcium: 9.2 mg/dL (ref 8.9–10.3)
Chloride: 105 mmol/L (ref 98–111)
Creatinine: 0.72 mg/dL (ref 0.44–1.00)
GFR, Estimated: >60 mL/min (ref >60)
Glucose, Bld: 213 mg/dL — ABNORMAL HIGH (ref 70–99)
Potassium: 3.9 mmol/L (ref 3.5–5.1)
Sodium: 137 mmol/L (ref 135–145)
Total Bilirubin: 0.4 mg/dL (ref 0.3–1.2)
Total Protein: 6.8 g/dL (ref 6.5–8.1)

## 2021-10-20 LAB — CBC WITH DIFFERENTIAL (CANCER CENTER ONLY)
Abs Immature Granulocytes: 0.03 10*3/uL (ref 0.00–0.07)
Basophils Absolute: 0.1 10*3/uL (ref 0.0–0.1)
Basophils Relative: 2 %
Eosinophils Absolute: 0.2 10*3/uL (ref 0.0–0.5)
Eosinophils Relative: 3 %
HCT: 36.3 % (ref 36.0–46.0)
Hemoglobin: 12.5 g/dL (ref 12.0–15.0)
Immature Granulocytes: 1 %
Lymphocytes Relative: 22 %
Lymphs Abs: 1.4 10*3/uL (ref 0.7–4.0)
MCH: 33.9 pg (ref 26.0–34.0)
MCHC: 34.4 g/dL (ref 30.0–36.0)
MCV: 98.4 fL (ref 80.0–100.0)
Monocytes Absolute: 0.7 10*3/uL (ref 0.1–1.0)
Monocytes Relative: 11 %
Neutro Abs: 4 10*3/uL (ref 1.7–7.7)
Neutrophils Relative %: 61 %
Platelet Count: 270 10*3/uL (ref 150–400)
RBC: 3.69 MIL/uL — ABNORMAL LOW (ref 3.87–5.11)
RDW: 15.8 % — ABNORMAL HIGH (ref 11.5–15.5)
WBC Count: 6.4 10*3/uL (ref 4.0–10.5)
nRBC: 0 % (ref 0.0–0.2)

## 2021-10-20 MED ORDER — SODIUM CHLORIDE 0.9 % IV SOLN
10.0000 mg | Freq: Once | INTRAVENOUS | Status: AC
  Administered 2021-10-20: 10 mg via INTRAVENOUS
  Filled 2021-10-20: qty 10

## 2021-10-20 MED ORDER — PALONOSETRON HCL INJECTION 0.25 MG/5ML
0.2500 mg | Freq: Once | INTRAVENOUS | Status: AC
  Administered 2021-10-20: 0.25 mg via INTRAVENOUS
  Filled 2021-10-20: qty 5

## 2021-10-20 MED ORDER — CYANOCOBALAMIN 1000 MCG/ML IJ SOLN
1000.0000 ug | Freq: Once | INTRAMUSCULAR | Status: AC
  Administered 2021-10-20: 1000 ug via INTRAMUSCULAR
  Filled 2021-10-20: qty 1

## 2021-10-20 MED ORDER — DEXTROSE 5 % IV SOLN: Freq: Once | INTRAVENOUS | Status: AC

## 2021-10-20 MED ORDER — SODIUM CHLORIDE 0.9% FLUSH
10.0000 mL | Freq: Once | INTRAVENOUS | Status: AC
  Administered 2021-10-20: 10 mL

## 2021-10-20 MED ORDER — FAM-TRASTUZUMAB DERUXTECAN-NXKI CHEMO 100 MG IV SOLR
4.5000 mg/kg | Freq: Once | INTRAVENOUS | Status: AC
  Administered 2021-10-20: 400 mg via INTRAVENOUS
  Filled 2021-10-20: qty 20

## 2021-10-20 MED ORDER — SODIUM CHLORIDE 0.9% FLUSH
10.0000 mL | INTRAVENOUS | Status: DC | PRN
  Administered 2021-10-20: 10 mL

## 2021-10-20 MED ORDER — ACETAMINOPHEN 325 MG PO TABS
650.0000 mg | ORAL_TABLET | Freq: Once | ORAL | Status: AC
  Administered 2021-10-20: 650 mg via ORAL
  Filled 2021-10-20: qty 2

## 2021-10-20 MED ORDER — HEPARIN SOD (PORK) LOCK FLUSH 100 UNIT/ML IV SOLN
500.0000 [IU] | Freq: Once | INTRAVENOUS | Status: AC | PRN
  Administered 2021-10-20: 500 [IU]

## 2021-10-20 MED ORDER — SODIUM CHLORIDE 0.9 % IV SOLN
150.0000 mg | Freq: Once | INTRAVENOUS | Status: AC
  Administered 2021-10-20: 150 mg via INTRAVENOUS
  Filled 2021-10-20: qty 150

## 2021-10-20 MED ORDER — DIPHENHYDRAMINE HCL 25 MG PO CAPS
25.0000 mg | ORAL_CAPSULE | Freq: Once | ORAL | Status: AC
  Administered 2021-10-20: 25 mg via ORAL
  Filled 2021-10-20: qty 1

## 2021-10-20 NOTE — Patient Instructions (Signed)
[facility] MEDICAL [facility]COLOGY  Discharge Instructions: Thank you for choosing [facility] to provide your oncology and hematology care.   If you have a lab appointment with the Cancer Center, please go directly to the Cancer Center and check in at the registration area.   Wear comfortable clothing and clothing appropriate for easy access to any Portacath or PICC line.   We strive to give you quality time with your provider. You may need to reschedule your appointment if you arrive late (15 or more minutes).  Arriving late affects you and other patients whose appointments are after yours.  Also, if you miss three or more appointments without notifying the office, you may be dismissed from the clinic at the provider's discretion.      For prescription refill requests, have your pharmacy contact our office and allow 72 hours for refills to be completed.    Today you received the following chemotherapy and/or immunotherapy agents: enhertu      To help prevent nausea and vomiting after your treatment, we encourage you to take your nausea medication as directed.  BELOW ARE SYMPTOMS THAT SHOULD BE REPORTED IMMEDIATELY: *FEVER GREATER THAN 100.4 F (38 C) OR HIGHER *CHILLS OR SWEATING *NAUSEA AND VOMITING THAT IS NOT CONTROLLED WITH YOUR NAUSEA MEDICATION *UNUSUAL SHORTNESS OF BREATH *UNUSUAL BRUISING OR BLEEDING *URINARY PROBLEMS (pain or burning when urinating, or frequent urination) *BOWEL PROBLEMS (unusual diarrhea, constipation, pain near the anus) TENDERNESS IN MOUTH AND THROAT WITH OR WITHOUT PRESENCE OF ULCERS (sore throat, sores in mouth, or a toothache) UNUSUAL RASH, SWELLING OR PAIN  UNUSUAL VAGINAL DISCHARGE OR ITCHING   Items with * indicate a potential emergency and should be followed up as soon as possible or go to the Emergency Department if any problems should occur.  Please show the CHEMOTHERAPY ALERT CARD or IMMUNOTHERAPY ALERT CARD at check-in to  the Emergency Department and triage nurse.  Should you have questions after your visit or need to cancel or reschedule your appointment, please contact [facility]COL[facility]  Dept: [phone]  and follow the prompts.  Office hours are 8:00 a.m. to 4:30 p.m. Monday - Friday. Please note that voicemails left after 4:00 p.m. may not be returned until the following business day.  We are closed weekends and major holidays. You have access to a nurse at all times for urgent questions. Please call the main number to the clinic Dept: [phone] and follow the prompts.   For any non-urgent questions, you may also contact your provider using MyChart. We now offer e-Visits for anyone 18 and older to request care online for non-urgent symptoms. For details visit [email or web address].   Also download the MyChart app! Go to the app store, search "MyChart", open the app, select [facility], and log in with your MyChart username and password.  Masks are optional in the cancer centers. If you would like for your care team to wear a mask while they are taking care of you, please let them know. You may have one support person who is at least 66 years old accompany you for your appointments. 

## 2021-10-20 NOTE — Assessment & Plan Note (Addendum)
08/04/20:Right mastectomy (Dr. 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 Neoadjuvant chemotherapy with TCH Perjeta 6 cycles followed by Kadcyla maintenance discontinued 11/11/2020 because of peripheral neuropathy 2. Followed bymastectomywith sentinel lymph node study8/2/22 3. Followed by adjuvant radiation therapy9/16/2022-11/04/2020 4.Decided not to do antiestrogen therapy because her ER was 0% on final path. 5.Consideration for neratinib ----------------------------------------------------------------------------------------------------------------- Right mastectomy area skin changes: Initially treated for shingles but since lesions did not improve skin biopsy was obtained on 07/14/2021: Infiltrating carcinoma consistent with breast origin CK7 and GA TA 3 positive, ER 0%, PR 0%, HER2 2+ by IHC, FISH negative ratio 1.56, copy #2.1  CT CAP 08/03/2021: Solid 0.8 cm right lower lobe lung nodule. Bone scan 08/03/2021: No definite evidence of metastatic disease Echocardiogram 07/22/2021: EF 60 to 65%  Current treatment:Cycle 5 Enhertu Enhertu toxicities:  1. Fatigue that lasted for 5 days, hair thinning 2. nausea: Takes: Zofran and Compazine 3.  Insomnia 4.  Worsening neuropathy: Reduced the dosage of Enhertu  We discussed the role of scans.  The next scan we will do would be CT of the chest but she would like to wait until after Christmas for next set of scans. This cutaneous metastases are improving and therefore we are fine with waiting till later for the scan.

## 2021-10-21 ENCOUNTER — Other Ambulatory Visit: Payer: Self-pay

## 2021-10-25 ENCOUNTER — Ambulatory Visit: Payer: Managed Care, Other (non HMO) | Attending: Adult Health

## 2021-10-25 VITALS — Wt 196.4 lb

## 2021-10-25 DIAGNOSIS — Z483 Aftercare following surgery for neoplasm: Secondary | ICD-10-CM | POA: Insufficient documentation

## 2021-10-25 NOTE — Therapy (Signed)
  OUTPATIENT PHYSICAL THERAPY SOZO SCREENING NOTE   Patient Name: Sabrina Woods MRN: [id number] DOB:12/17/1955, 67 y.o., female Today's Date: 10/25/2021  PCP: [name], MD REFERRING PROVIDER: Wilber Bihari Rippey of Session - 10/25/21 1621     Visit Number 13   # unchanged due to screen only   PT Start Time 1618    PT Stop Time 1623    PT Time Calculation (min) 5 min    Activity Tolerance Patient tolerated treatment well    Behavior During Therapy [facility] for tasks assessed/performed             Past Medical History:  Diagnosis Date   Acid reflux    Anxiety    Breast cancer ([place])    Breast cancer ([place]) 2022   Depression    HISTORY OF   Fibroid    PONV (postoperative nausea and vomiting)    Smoker    Past Surgical History:  Procedure Laterality Date   CESAREAN SECTION     X 2   MASTECTOMY     MASTECTOMY WITH AXILLARY LYMPH NODE DISSECTION Right 08/04/2020   Procedure: RIGHT MASTECTOMY WITH RIGHT AXILLARY LYMPH NODE DISSECTION;  Surgeon: Rolm Bookbinder, MD;  Location: [place];  Service: General;  Laterality: Right;   PORTACATH PLACEMENT Left 03/17/2020   Procedure: INSERTION PORT-A-CATH;  Surgeon: Rolm Bookbinder, MD;  Location: [place];  Service: General;  Laterality: Left;   PORTACATH PLACEMENT Left 08/05/2021   Procedure: PORT PLACEMENT WITH ULTRASOUND GUIDANCE;  Surgeon: Rolm Bookbinder, MD;  Location: [place];  Service: General;  Laterality: Left;  LMA   Patient Active Problem List   Diagnosis Date Noted   Fatigue 04/08/2021   S/P mastectomy, right 08/04/2020   Chemotherapy-induced peripheral neuropathy ([place]) 05/20/2020   Malignant neoplasm of upper-outer quadrant of right breast in female, estrogen receptor positive ([place]) 03/10/2020   Fibroid    Smoker    Depression     REFERRING DIAG: right breast cancer at risk for lymphedema  THERAPY DIAG:  Aftercare following surgery for  neoplasm  PERTINENT HISTORY: R breast cancer s/p R mastectomy on 08/04/20 with 2 R positive axillary nodes, high grade DCIS. Has completed chemo as of Oct 2022 and radiation 11/04/20  PRECAUTIONS: right UE Lymphedema risk, None  SUBJECTIVE: Pt returns after 1 month for reassess after having a high change from baseline that indicated subclinical lymphedema.   PAIN:  Are you having pain? No  SOZO SCREENING: Patient was assessed today using the SOZO machine to determine the lymphedema index score. This was compared to her baseline score. It was determined that she is within the recommended range when compared to her baseline and no further action is needed at this time. She will continue SOZO screenings. These are done every 3 months for 2 years post operatively followed by every 6 months for 2 years, and then annually.   L-DEX FLOWSHEETS - 10/25/21 1600       L-DEX LYMPHEDEMA SCREENING   Measurement Type Unilateral    L-DEX MEASUREMENT EXTREMITY Upper Extremity    POSITION  Standing    DOMINANT SIDE Right    At Risk Side Right    BASELINE SCORE (UNILATERAL) -3.5    L-DEX SCORE (UNILATERAL) -3.6    VALUE CHANGE (UNILAT) -0.1               Otelia Limes, PTA 10/25/2021, 4:24 PM

## 2021-10-26 ENCOUNTER — Other Ambulatory Visit: Payer: Self-pay

## 2021-11-02 NOTE — Progress Notes (Signed)
Patient Care Team: [name], MD as PCP - General (Hematology and Oncology) Rolm Bookbinder, MD as Consulting Physician (General Surgery) Kyung Rudd, MD as Consulting Physician (Radiation Oncology)  DIAGNOSIS:  Encounter Diagnosis  Name Primary?   Malignant neoplasm of upper-outer quadrant of right breast in female, estrogen receptor positive ([place]) Yes    SUMMARY OF ONCOLOGIC HISTORY: Oncology History  Malignant neoplasm of upper-outer quadrant of right breast in female, estrogen receptor positive ([place])  03/10/2020 Initial Diagnosis   Palpable right breast mass x6 months.  Mammogram revealed left breast cysts (intraductal papilloma), 11 cm right breast mass and 2 masses 2.6 cm each in the right axilla: Biopsy grade 3 IDC ER 5% weak, PR 0%, Ki-67 25%, HER-2 3+ IHC positive   03/10/2020 Cancer Staging   Staging form: Breast, AJCC 8th Edition - Clinical stage from 03/10/2020: Stage IIIA (cT3, cN1, cM0, G3, ER+, PR-, HER2+) - Signed by [name], MD on 03/10/2020 Stage prefix: Initial diagnosis   03/19/2020 - 07/01/2020 Chemotherapy   TCH Perjeta   08/04/2020 Surgery   Right mastectomy (Dr. 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, high-grade DCIS with necrosis and calcifications, and 2 right axillary lymph nodes positive for metastatic carcinoma   08/04/2020 Cancer Staging   Staging form: Breast, AJCC 8th Edition - Pathologic stage from 08/04/2020: No Stage Recommended (ypTis (DCIS), pN2, cM0) - Signed by Gardenia Phlegm, NP on 02/05/2021 Stage prefix: Post-therapy   08/21/2020 - 10/23/2020 Chemotherapy   Discontinued due to peripheral neuropathy Patient is on Treatment Plan : BREAST ADO-Trastuzumab Emtansine (Kadcyla) q21d      08/2020 -  Anti-estrogen oral therapy    Decided not to do antiestrogen therapy because her ER was 0% on final path.   10/06/2020 - 11/04/2020 Radiation Therapy   Adjuvant radiation     Relapse/Recurrence   Right mastectomy area skin changes: Initially treated for shingles but since lesions did not improve skin biopsy was obtained on 07/14/2021: Infiltrating carcinoma consistent with breast origin CK7 and GA TA 3 positive, ER 0%, PR 0%, HER2 2+ by IHC, FISH negative ratio 1.56, copy #2.1   07/26/2021 -  Chemotherapy   Patient is on Treatment Plan : BREAST METASTATIC Fam-Trastuzumab Deruxtecan-nxki (Enhertu) (5.4) q21d     07/28/2021 - 07/28/2021 Chemotherapy   Patient is on Treatment Plan : BREAST METASTATIC fam-trastuzumab deruxtecan-nxki (Enhertu) q21d       CHIEF COMPLIANT: Follow-up right breast cancer  INTERVAL HISTORY: Sabrina Woods is a 66 y.o. female with above-mentioned history of right breast cancer. She presents to the clinic today for a follow-up. She states that she is having more fatigue. But the nausea has got better. Appetite is getting better. She says she is not able to run. She can walk and do daily activities. 45 minutes she is down. First week after treatment fatigue is about a 4 on a 1-10 scale and after a couple days its a 3. The neuropathy is still there and thinning of the hair. Chest is all dried up now.   ALLERGIES:  has No Known Allergies.  MEDICATIONS:  Current Outpatient Medications  Medication Sig Dispense Refill   ALPRAZolam (XANAX) 0.25 MG tablet Take 1 tablet (0.25 mg total) by mouth 2 (two) times daily as needed for anxiety. 60 tablet 3   esomeprazole (NEXIUM) 20 MG capsule Take 20 mg by mouth daily.     gabapentin (NEURONTIN) 600 MG tablet Take 1 tablet (600 mg total) by  mouth 3 (three) times daily. 90 tablet 3   lidocaine-prilocaine (EMLA) cream APPLY TO AFFECTED AREA ONCE AS DIRECTED     loratadine (CLARITIN) 10 MG tablet Take 10 mg by mouth daily as needed for rhinitis.     Multiple Vitamin (MULTIVITAMIN) capsule Take 1 capsule by mouth daily.     nicotine (NICODERM CQ - DOSED IN MG/24 HR) 7 mg/24hr patch PLACE 1 PATCH (7 MG TOTAL)  ONTO THE SKIN DAILY.     ondansetron (ZOFRAN) 24 MG tablet Take 24 mg by mouth once.     prochlorperazine (COMPAZINE) 10 MG tablet Take 1 tablet (10 mg total) by mouth every 6 (six) hours as needed (Nausea or vomiting). 30 tablet 1   UNABLE TO FIND Take by mouth in the morning, at noon, and at bedtime. palmitoylethanolamide luteolin.     No current facility-administered medications for this visit.    PHYSICAL EXAMINATION: ECOG PERFORMANCE STATUS: 1 - Symptomatic but completely ambulatory  Vitals:   11/09/21 0954  BP: (!) 160/75  Pulse: 87  Resp: 18  Temp: (!) 97.5 F (36.4 C)  SpO2: 99%   Filed Weights   11/09/21 0954  Weight: 199 lb 3.2 oz (90.4 kg)      LABORATORY DATA:  I have reviewed the data as listed    Latest Ref Rng & Units 10/20/2021    8:46 AM 09/29/2021    9:17 AM 09/09/2021    8:46 AM  CMP  Glucose 70 - 99 mg/dL 213  251  218   BUN 8 - 23 mg/dL _0 Creatinine 0.44 - 1.00 mg/dL 0.72  0.73  0.69   Sodium 135 - 145 mmol/L 137  138  137   Potassium 3.5 - 5.1 mmol/L 3.9  3.8  3.8   Chloride 98 - 111 mmol/L 105  105  106   CO2 22 - 32 mmol/L _1 Calcium 8.9 - 10.3 mg/dL 9.2  9.3  9.7   Total Protein 6.5 - 8.1 g/dL 6.8  6.9  6.7   Total Bilirubin 0.3 - 1.2 mg/dL 0.4  0.4  0.3   Alkaline Phos 38 - 126 U/L 103  100  87   AST 15 - 41 U/L _2 ALT 0 - 44 U/L 34  33  25     Lab Results  Component Value Date   WBC 5.3 11/09/2021   HGB 12.1 11/09/2021   HCT 35.9 (L) 11/09/2021   MCV 100.0 11/09/2021   PLT 252 11/09/2021   NEUTROABS 3.3 11/09/2021    ASSESSMENT & PLAN:  Malignant neoplasm of upper-outer quadrant of right breast in female, estrogen receptor positive ([place]) 08/04/20: Right mastectomy (Dr. 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  Neoadjuvant chemotherapy with TCH Perjeta 6 cycles followed by Kadcyla maintenance discontinued 11/11/2020 because of peripheral neuropathy 2. Followed by mastectomy with sentinel lymph node study 08/04/20 3. Followed by adjuvant radiation therapy 09/18/2020- 11/04/2020 4.  Decided not to do antiestrogen therapy because her ER was 0% on final path. 5.  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  Current treatment: Enhertu ----------------------------------------------------------------------------------------------------------------- Right mastectomy area skin changes: Initially treated for shingles but since lesions did not improve skin biopsy was obtained on 07/14/2021: Infiltrating carcinoma consistent with breast origin CK7 and  GA TA 3 positive, ER 0%, PR 0%, HER2 2+ by IHC, FISH negative ratio 1.56, copy #2.1   CT CAP 08/03/2021: Solid 0.8 cm right lower lobe lung nodule. Bone scan 08/03/2021: No definite evidence of metastatic disease  Echocardiogram 07/22/2021: EF 60 to 65%    Current treatment: Cycle 6 Enhertu Enhertu toxicities:  1. Fatigue that lasted for 5 days, hair thinning 2. nausea: Takes: Zofran and Compazine, added Emend to her regimen.  Markedly better 3.  Insomnia 4.  Worsening neuropathy: Reduced the dosage of Enhertu, stable   CT of the chest: She would like to wait until after Christmas for next set of scans. This cutaneous metastases are improving and therefore we are fine with waiting till later for the scan.  Return to clinic every 3 weeks for Enhertu      No orders of the defined types were placed in this encounter.  The patient has a good understanding of the overall plan. she agrees with it. she will call with any problems that may develop before the next visit here. Total time spent: 30 mins including face to face time and time spent for planning, charting and co-ordination of care   Harriette Ohara, MD 11/09/21    I Gardiner Coins am scribing for Dr. Lindi Adie  I  have reviewed the above documentation for accuracy and completeness, and I agree with the above.

## 2021-11-08 MED FILL — Dexamethasone Sodium Phosphate Inj 100 MG/10ML: INTRAMUSCULAR | Qty: 1 | Status: AC

## 2021-11-08 MED FILL — Fosaprepitant Dimeglumine For IV Infusion 150 MG (Base Eq): INTRAVENOUS | Qty: 5 | Status: AC

## 2021-11-09 ENCOUNTER — Inpatient Hospital Stay: Payer: Medicare Other

## 2021-11-09 ENCOUNTER — Inpatient Hospital Stay (HOSPITAL_BASED_OUTPATIENT_CLINIC_OR_DEPARTMENT_OTHER): Payer: Medicare Other | Admitting: Hematology and Oncology

## 2021-11-09 ENCOUNTER — Inpatient Hospital Stay: Payer: Medicare Other | Attending: Hematology and Oncology

## 2021-11-09 VITALS — BP 160/75 | HR 87 | Temp 97.5°F | Resp 18 | Ht 66.0 in | Wt 199.2 lb

## 2021-11-09 DIAGNOSIS — C773 Secondary and unspecified malignant neoplasm of axilla and upper limb lymph nodes: Secondary | ICD-10-CM | POA: Insufficient documentation

## 2021-11-09 DIAGNOSIS — G620 Drug-induced polyneuropathy: Secondary | ICD-10-CM | POA: Insufficient documentation

## 2021-11-09 DIAGNOSIS — R5383 Other fatigue: Secondary | ICD-10-CM

## 2021-11-09 DIAGNOSIS — Z23 Encounter for immunization: Secondary | ICD-10-CM | POA: Diagnosis not present

## 2021-11-09 DIAGNOSIS — Z5112 Encounter for antineoplastic immunotherapy: Secondary | ICD-10-CM | POA: Diagnosis not present

## 2021-11-09 DIAGNOSIS — C50411 Malignant neoplasm of upper-outer quadrant of right female breast: Secondary | ICD-10-CM

## 2021-11-09 DIAGNOSIS — Z170 Estrogen receptor positive status [ER+]: Secondary | ICD-10-CM | POA: Diagnosis not present

## 2021-11-09 DIAGNOSIS — E032 Hypothyroidism due to medicaments and other exogenous substances: Secondary | ICD-10-CM | POA: Insufficient documentation

## 2021-11-09 DIAGNOSIS — C792 Secondary malignant neoplasm of skin: Secondary | ICD-10-CM | POA: Insufficient documentation

## 2021-11-09 LAB — CMP (CANCER CENTER ONLY)
ALT: 32 U/L (ref 0–44)
AST: 28 U/L (ref 15–41)
Albumin: 3.6 g/dL (ref 3.5–5.0)
Alkaline Phosphatase: 102 U/L (ref 38–126)
Anion gap: 7 (ref 5–15)
BUN: 9 mg/dL (ref 8–23)
CO2: 25 mmol/L (ref 22–32)
Calcium: 9 mg/dL (ref 8.9–10.3)
Chloride: 106 mmol/L (ref 98–111)
Creatinine: 0.77 mg/dL (ref 0.44–1.00)
GFR, Estimated: >60 mL/min (ref >60)
Glucose, Bld: 191 mg/dL — ABNORMAL HIGH (ref 70–99)
Potassium: 3.9 mmol/L (ref 3.5–5.1)
Sodium: 138 mmol/L (ref 135–145)
Total Bilirubin: 0.3 mg/dL (ref 0.3–1.2)
Total Protein: 6.6 g/dL (ref 6.5–8.1)

## 2021-11-09 LAB — CBC WITH DIFFERENTIAL (CANCER CENTER ONLY)
Abs Immature Granulocytes: 0.02 10*3/uL (ref 0.00–0.07)
Basophils Absolute: 0.1 10*3/uL (ref 0.0–0.1)
Basophils Relative: 2 %
Eosinophils Absolute: 0.1 10*3/uL (ref 0.0–0.5)
Eosinophils Relative: 2 %
HCT: 35.9 % — ABNORMAL LOW (ref 36.0–46.0)
Hemoglobin: 12.1 g/dL (ref 12.0–15.0)
Immature Granulocytes: 0 %
Lymphocytes Relative: 23 %
Lymphs Abs: 1.2 10*3/uL (ref 0.7–4.0)
MCH: 33.7 pg (ref 26.0–34.0)
MCHC: 33.7 g/dL (ref 30.0–36.0)
MCV: 100 fL (ref 80.0–100.0)
Monocytes Absolute: 0.6 10*3/uL (ref 0.1–1.0)
Monocytes Relative: 10 %
Neutro Abs: 3.3 10*3/uL (ref 1.7–7.7)
Neutrophils Relative %: 63 %
Platelet Count: 252 10*3/uL (ref 150–400)
RBC: 3.59 MIL/uL — ABNORMAL LOW (ref 3.87–5.11)
RDW: 14.4 % (ref 11.5–15.5)
WBC Count: 5.3 10*3/uL (ref 4.0–10.5)
nRBC: 0 % (ref 0.0–0.2)

## 2021-11-09 MED ORDER — INFLUENZA VAC A&B SA ADJ QUAD 0.5 ML IM PRSY
0.5000 mL | PREFILLED_SYRINGE | Freq: Once | INTRAMUSCULAR | Status: AC
  Administered 2021-11-09: 0.5 mL via INTRAMUSCULAR
  Filled 2021-11-09: qty 0.5

## 2021-11-09 MED ORDER — HEPARIN SOD (PORK) LOCK FLUSH 100 UNIT/ML IV SOLN
500.0000 [IU] | Freq: Once | INTRAVENOUS | Status: AC | PRN
  Administered 2021-11-09: 500 [IU]

## 2021-11-09 MED ORDER — PALONOSETRON HCL INJECTION 0.25 MG/5ML
0.2500 mg | Freq: Once | INTRAVENOUS | Status: AC
  Administered 2021-11-09: 0.25 mg via INTRAVENOUS
  Filled 2021-11-09: qty 5

## 2021-11-09 MED ORDER — SODIUM CHLORIDE 0.9 % IV SOLN
10.0000 mg | Freq: Once | INTRAVENOUS | Status: AC
  Administered 2021-11-09: 10 mg via INTRAVENOUS
  Filled 2021-11-09: qty 10

## 2021-11-09 MED ORDER — FAM-TRASTUZUMAB DERUXTECAN-NXKI CHEMO 100 MG IV SOLR
4.5000 mg/kg | Freq: Once | INTRAVENOUS | Status: AC
  Administered 2021-11-09: 400 mg via INTRAVENOUS
  Filled 2021-11-09: qty 20

## 2021-11-09 MED ORDER — DEXTROSE 5 % IV SOLN: Freq: Once | INTRAVENOUS | Status: AC

## 2021-11-09 MED ORDER — ACETAMINOPHEN 325 MG PO TABS
650.0000 mg | ORAL_TABLET | Freq: Once | ORAL | Status: AC
  Administered 2021-11-09: 650 mg via ORAL
  Filled 2021-11-09: qty 2

## 2021-11-09 MED ORDER — SODIUM CHLORIDE 0.9% FLUSH
10.0000 mL | INTRAVENOUS | Status: DC | PRN
  Administered 2021-11-09: 10 mL

## 2021-11-09 MED ORDER — SODIUM CHLORIDE 0.9 % IV SOLN
150.0000 mg | Freq: Once | INTRAVENOUS | Status: AC
  Administered 2021-11-09: 150 mg via INTRAVENOUS
  Filled 2021-11-09: qty 150

## 2021-11-09 MED ORDER — CYANOCOBALAMIN 1000 MCG/ML IJ SOLN
1000.0000 ug | Freq: Once | INTRAMUSCULAR | Status: AC
  Administered 2021-11-09: 1000 ug via INTRAMUSCULAR
  Filled 2021-11-09: qty 1

## 2021-11-09 MED ORDER — DIPHENHYDRAMINE HCL 25 MG PO CAPS
25.0000 mg | ORAL_CAPSULE | Freq: Once | ORAL | Status: AC
  Administered 2021-11-09: 25 mg via ORAL
  Filled 2021-11-09: qty 1

## 2021-11-09 MED ORDER — SODIUM CHLORIDE 0.9% FLUSH
10.0000 mL | Freq: Once | INTRAVENOUS | Status: AC
  Administered 2021-11-09: 10 mL

## 2021-11-09 NOTE — Assessment & Plan Note (Addendum)
08/04/20: Right mastectomy (Dr. 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 Neoadjuvant chemotherapy with TCH Perjeta 6 cycles followed by Kadcyla maintenance discontinued 11/11/2020 because of peripheral neuropathy 2. Followed by mastectomy with sentinel lymph node study 08/04/20 3. Followed by adjuvant radiation therapy 09/18/2020- 11/04/2020 4.  Decided not to do antiestrogen therapy because her ER was 0% on final path. 5.  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 Bone scan 08/03/2021: No definite evidence of metastatic disease  Echocardiogram 07/22/2021: EF 60 to 65%    Current treatment: Cycle 6 Enhertu Enhertu toxicities:  1. Fatigue that lasted for 5 days, hair thinning 2. nausea: Takes: Zofran and Compazine, added Emend to her regimen.  Markedly better 3.  Insomnia 4.  Worsening neuropathy: Reduced the dosage of Enhertu, stable   CT of the chest: She would like to wait until after Christmas for next set of scans. This cutaneous metastases are improving and therefore we are fine with waiting till later for the scan.  Return to clinic every 3 weeks for [facility]

## 2021-11-09 NOTE — Patient Instructions (Signed)
[facility] MEDICAL ONCOLOGY  Discharge Instructions: Thank you for choosing [facility] to provide your oncology and hematology care.   If you have a lab appointment with the Cancer Center, please go directly to the Cancer Center and check in at the registration area.   Wear comfortable clothing and clothing appropriate for easy access to any Portacath or PICC line.   We strive to give you quality time with your provider. You may need to reschedule your appointment if you arrive late (15 or more minutes).  Arriving late affects you and other patients whose appointments are after yours.  Also, if you miss three or more appointments without notifying the office, you may be dismissed from the clinic at the provider's discretion.      For prescription refill requests, have your pharmacy contact our office and allow 72 hours for refills to be completed.    Today you received the following chemotherapy and/or immunotherapy agents: enhertu      To help prevent nausea and vomiting after your treatment, we encourage you to take your nausea medication as directed.  BELOW ARE SYMPTOMS THAT SHOULD BE REPORTED IMMEDIATELY: *FEVER GREATER THAN 100.4 F (38 C) OR HIGHER *CHILLS OR SWEATING *NAUSEA AND VOMITING THAT IS NOT CONTROLLED WITH YOUR NAUSEA MEDICATION *UNUSUAL SHORTNESS OF BREATH *UNUSUAL BRUISING OR BLEEDING *URINARY PROBLEMS (pain or burning when urinating, or frequent urination) *BOWEL PROBLEMS (unusual diarrhea, constipation, pain near the anus) TENDERNESS IN MOUTH AND THROAT WITH OR WITHOUT PRESENCE OF ULCERS (sore throat, sores in mouth, or a toothache) UNUSUAL RASH, SWELLING OR PAIN  UNUSUAL VAGINAL DISCHARGE OR ITCHING   Items with * indicate a potential emergency and should be followed up as soon as possible or go to the Emergency Department if any problems should occur.  Please show the CHEMOTHERAPY ALERT CARD or IMMUNOTHERAPY ALERT CARD at check-in to  the Emergency Department and triage nurse.  Should you have questions after your visit or need to cancel or reschedule your appointment, please contact [facility]ICAL [facility]COLOG[facility]  Dept: [phone]  and follow the prompts.  Office hours are 8:00 a.m. to 4:30 p.m. Monday - Friday. Please note that voicemails left after 4:00 p.m. may not be returned until the following business day.  We are closed weekends and major holidays. You have access to a nurse at all times for urgent questions. Please call the main number to the clinic Dept: [phone] and follow the prompts.   For any non-urgent questions, you may also contact your provider using MyChart. We now offer e-Visits for anyone 18 and older to request care online for non-urgent symptoms. For details visit [email or web address].   Also download the MyChart app! Go to the app store, search "MyChart", open the app, select [facility], and log in with your MyChart username and password.  Masks are optional in the cancer centers. If you would like for your care team to wear a mask while they are taking care of you, please let them know. You may have one support Camille Dragan who is at least 66 years old accompany you for your appointments. 

## 2021-11-10 ENCOUNTER — Telehealth: Payer: Self-pay | Admitting: Hematology and Oncology

## 2021-11-10 NOTE — Telephone Encounter (Signed)
Scheduled appointment per 11/7 los. Left voicemail.

## 2021-11-26 NOTE — Progress Notes (Addendum)
Patient Care Team: [name], MD as PCP - General (Hematology and Oncology) Rolm Bookbinder, MD as Consulting Physician (General Surgery) Kyung Rudd, MD as Consulting Physician (Radiation Oncology)  DIAGNOSIS:  Encounter Diagnosis  Name Primary?   Malignant neoplasm of upper-outer quadrant of right breast in female, estrogen receptor positive ([place]) Yes    SUMMARY OF ONCOLOGIC HISTORY: Oncology History  Malignant neoplasm of upper-outer quadrant of right breast in female, estrogen receptor positive ([place])  03/10/2020 Initial Diagnosis   Palpable right breast mass x6 months.  Mammogram revealed left breast cysts (intraductal papilloma), 11 cm right breast mass and 2 masses 2.6 cm each in the right axilla: Biopsy grade 3 IDC ER 5% weak, PR 0%, Ki-67 25%, HER-2 3+ IHC positive   03/10/2020 Cancer Staging   Staging form: Breast, AJCC 8th Edition - Clinical stage from 03/10/2020: Stage IIIA (cT3, cN1, cM0, G3, ER+, PR-, HER2+) - Signed by [name], MD on 03/10/2020 Stage prefix: Initial diagnosis   03/19/2020 - 07/01/2020 Chemotherapy   TCH Perjeta   08/04/2020 Surgery   Right mastectomy (Dr. 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, high-grade DCIS with necrosis and calcifications, and 2 right axillary lymph nodes positive for metastatic carcinoma   08/04/2020 Cancer Staging   Staging form: Breast, AJCC 8th Edition - Pathologic stage from 08/04/2020: No Stage Recommended (ypTis (DCIS), pN2, cM0) - Signed by Gardenia Phlegm, NP on 02/05/2021 Stage prefix: Post-therapy   08/21/2020 - 10/23/2020 Chemotherapy   Discontinued due to peripheral neuropathy Patient is on Treatment Plan : BREAST ADO-Trastuzumab Emtansine (Kadcyla) q21d      08/2020 -  Anti-estrogen oral therapy    Decided not to do antiestrogen therapy because her ER was 0% on final path.   10/06/2020 - 11/04/2020 Radiation Therapy   Adjuvant radiation     Relapse/Recurrence   Right mastectomy area skin changes: Initially treated for shingles but since lesions did not improve skin biopsy was obtained on 07/14/2021: Infiltrating carcinoma consistent with breast origin CK7 and GA TA 3 positive, ER 0%, PR 0%, HER2 2+ by IHC, FISH negative ratio 1.56, copy #2.1   07/26/2021 -  Chemotherapy   Patient is on Treatment Plan : BREAST METASTATIC Fam-Trastuzumab Deruxtecan-nxki (Enhertu) (5.4) q21d     07/28/2021 - 07/28/2021 Chemotherapy   Patient is on Treatment Plan : BREAST METASTATIC fam-trastuzumab deruxtecan-nxki (Enhertu) q21d       CHIEF COMPLIANT: Follow-up right breast cancer cycle 7 Enhertu  INTERVAL HISTORY: Sabrina Woods is a 66 y.o female with a history of right breast cancer. She presents to the clinic for a follow-up. She states that the nausea was better . The fatigue was about 5 days. She says the numbness on the feet is still about the same.   ALLERGIES:  has No Known Allergies.  MEDICATIONS:  Current Outpatient Medications  Medication Sig Dispense Refill   ALPRAZolam (XANAX) 0.25 MG tablet Take 1 tablet (0.25 mg total) by mouth 2 (two) times daily as needed for anxiety. 60 tablet 3   esomeprazole (NEXIUM) 20 MG capsule Take 20 mg by mouth daily.     gabapentin (NEURONTIN) 600 MG tablet Take 1 tablet (600 mg total) by mouth 3 (three) times daily. 90 tablet 3   lidocaine-prilocaine (EMLA) cream APPLY TO AFFECTED AREA ONCE AS DIRECTED     loratadine (CLARITIN) 10 MG tablet Take 10 mg by mouth daily as needed for rhinitis.     Multiple Vitamin (MULTIVITAMIN) capsule  Take 1 capsule by mouth daily.     nicotine (NICODERM CQ - DOSED IN MG/24 HR) 7 mg/24hr patch PLACE 1 PATCH (7 MG TOTAL) ONTO THE SKIN DAILY.     ondansetron (ZOFRAN) 24 MG tablet Take 24 mg by mouth once.     prochlorperazine (COMPAZINE) 10 MG tablet Take 1 tablet (10 mg total) by mouth every 6 (six) hours as needed (Nausea or vomiting). 30 tablet 1   UNABLE TO FIND  Take by mouth in the morning, at noon, and at bedtime. palmitoylethanolamide luteolin.     No current facility-administered medications for this visit.    PHYSICAL EXAMINATION: ECOG PERFORMANCE STATUS: 1 - Symptomatic but completely ambulatory  Vitals:   11/30/21 0913  BP: (!) 159/81  Pulse: 90  Resp: 18  Temp: (!) 97.2 F (36.2 C)  SpO2: 99%   Filed Weights   11/30/21 0913  Weight: 199 lb 3.2 oz (90.4 kg)      LABORATORY DATA:  I have reviewed the data as listed    Latest Ref Rng & Units 11/09/2021    9:40 AM 10/20/2021    8:46 AM 09/29/2021    9:17 AM  CMP  Glucose 70 - 99 mg/dL 191  213  251   BUN 8 - 23 mg/dL _0 Creatinine 0.44 - 1.00 mg/dL 0.77  0.72  0.73   Sodium 135 - 145 mmol/L 138  137  138   Potassium 3.5 - 5.1 mmol/L 3.9  3.9  3.8   Chloride 98 - 111 mmol/L 106  105  105   CO2 22 - 32 mmol/L _1 Calcium 8.9 - 10.3 mg/dL 9.0  9.2  9.3   Total Protein 6.5 - 8.1 g/dL 6.6  6.8  6.9   Total Bilirubin 0.3 - 1.2 mg/dL 0.3  0.4  0.4   Alkaline Phos 38 - 126 U/L 102  103  100   AST 15 - 41 U/L _2 ALT 0 - 44 U/L 32  34  33     Lab Results  Component Value Date   WBC 4.7 11/30/2021   HGB 12.6 11/30/2021   HCT 37.6 11/30/2021   MCV 100.5 (H) 11/30/2021   PLT 253 11/30/2021   NEUTROABS 2.9 11/30/2021    ASSESSMENT & PLAN:  Malignant neoplasm of upper-outer quadrant of right breast in female, estrogen receptor positive ([place]) 08/04/20: Right mastectomy (Dr. 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 Neoadjuvant chemotherapy with TCH Perjeta 6 cycles followed by Kadcyla maintenance discontinued 11/11/2020 because of peripheral neuropathy 2. Followed by mastectomy with sentinel lymph node study 08/04/20 3. Followed by adjuvant radiation therapy 09/18/2020-  11/04/2020 4.  Decided not to do antiestrogen therapy because her ER was 0% on final path. 5.  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 Bone scan 08/03/2021: No definite evidence of metastatic disease  Echocardiogram 07/22/2021: EF 60 to 65%  Current treatment: Cycle 7 Enhertu Enhertu toxicities:  1. Fatigue that lasted for 5 days, hair thinning 2. nausea: Takes: Zofran and Compazine, added Emend to her regimen.  Markedly better 3.  Insomnia 4.  Worsening neuropathy: Reduced the dosage of Enhertu, stable 5.  Hypothyroidism: We will send a prescription for Synthroid.  CT of the chest: Will be done in January. This cutaneous metastases are improving and therefore we are fine with waiting till later for the scan.   Return to clinic every 3 weeks for Enhertu    Orders Placed This Encounter  Procedures   CT Chest W Contrast    Standing Status:   Future    Standing Expiration Date:   11/30/2022    Order Specific Question:   If indicated for the ordered procedure, I authorize the administration of contrast media per Radiology protocol    Answer:   Yes    Order Specific Question:   Does the patient have a contrast media/X-ray dye allergy?    Answer:   No    Order Specific Question:   Preferred imaging location?    Answer:   [facility]   The patient has a good understanding of the overall plan. she agrees with it. she will call with any problems that may develop before the next visit here. Total time  spent: 30 mins including face to face time and time spent for planning, charting and co-ordination of care   Harriette Ohara, MD 11/30/21   I Gardiner Coins am scribing for Dr. Lindi Adie  I have reviewed the above documentation for accuracy and completeness, and I agree with the above.

## 2021-11-29 MED FILL — Dexamethasone Sodium Phosphate Inj 100 MG/10ML: INTRAMUSCULAR | Qty: 1 | Status: AC

## 2021-11-29 MED FILL — Fosaprepitant Dimeglumine For IV Infusion 150 MG (Base Eq): INTRAVENOUS | Qty: 5 | Status: AC

## 2021-11-30 ENCOUNTER — Inpatient Hospital Stay: Payer: Medicare Other

## 2021-11-30 ENCOUNTER — Other Ambulatory Visit: Payer: Self-pay | Admitting: *Deleted

## 2021-11-30 ENCOUNTER — Inpatient Hospital Stay (HOSPITAL_BASED_OUTPATIENT_CLINIC_OR_DEPARTMENT_OTHER): Payer: Medicare Other | Admitting: Hematology and Oncology

## 2021-11-30 VITALS — BP 159/81 | HR 90 | Temp 97.2°F | Resp 18 | Ht 66.0 in | Wt 199.2 lb

## 2021-11-30 DIAGNOSIS — Z170 Estrogen receptor positive status [ER+]: Secondary | ICD-10-CM | POA: Diagnosis not present

## 2021-11-30 DIAGNOSIS — Z5112 Encounter for antineoplastic immunotherapy: Secondary | ICD-10-CM | POA: Diagnosis not present

## 2021-11-30 DIAGNOSIS — R5383 Other fatigue: Secondary | ICD-10-CM

## 2021-11-30 DIAGNOSIS — C50411 Malignant neoplasm of upper-outer quadrant of right female breast: Secondary | ICD-10-CM

## 2021-11-30 LAB — CMP (CANCER CENTER ONLY)
ALT: 36 U/L (ref 0–44)
AST: 33 U/L (ref 15–41)
Albumin: 3.9 g/dL (ref 3.5–5.0)
Alkaline Phosphatase: 108 U/L (ref 38–126)
Anion gap: 6 (ref 5–15)
BUN: 8 mg/dL (ref 8–23)
CO2: 26 mmol/L (ref 22–32)
Calcium: 9.6 mg/dL (ref 8.9–10.3)
Chloride: 106 mmol/L (ref 98–111)
Creatinine: 0.74 mg/dL (ref 0.44–1.00)
GFR, Estimated: >60 mL/min (ref >60)
Glucose, Bld: 221 mg/dL — ABNORMAL HIGH (ref 70–99)
Potassium: 3.7 mmol/L (ref 3.5–5.1)
Sodium: 138 mmol/L (ref 135–145)
Total Bilirubin: 0.3 mg/dL (ref 0.3–1.2)
Total Protein: 7 g/dL (ref 6.5–8.1)

## 2021-11-30 LAB — CBC WITH DIFFERENTIAL (CANCER CENTER ONLY)
Abs Immature Granulocytes: 0.01 10*3/uL (ref 0.00–0.07)
Basophils Absolute: 0.1 10*3/uL (ref 0.0–0.1)
Basophils Relative: 1 %
Eosinophils Absolute: 0.2 10*3/uL (ref 0.0–0.5)
Eosinophils Relative: 3 %
HCT: 37.6 % (ref 36.0–46.0)
Hemoglobin: 12.6 g/dL (ref 12.0–15.0)
Immature Granulocytes: 0 %
Lymphocytes Relative: 23 %
Lymphs Abs: 1.1 10*3/uL (ref 0.7–4.0)
MCH: 33.7 pg (ref 26.0–34.0)
MCHC: 33.5 g/dL (ref 30.0–36.0)
MCV: 100.5 fL — ABNORMAL HIGH (ref 80.0–100.0)
Monocytes Absolute: 0.5 10*3/uL (ref 0.1–1.0)
Monocytes Relative: 10 %
Neutro Abs: 2.9 10*3/uL (ref 1.7–7.7)
Neutrophils Relative %: 63 %
Platelet Count: 253 10*3/uL (ref 150–400)
RBC: 3.74 MIL/uL — ABNORMAL LOW (ref 3.87–5.11)
RDW: 13.6 % (ref 11.5–15.5)
WBC Count: 4.7 10*3/uL (ref 4.0–10.5)
nRBC: 0 % (ref 0.0–0.2)

## 2021-11-30 MED ORDER — LEVOTHYROXINE SODIUM 50 MCG PO TABS: 50.0000 ug | ORAL_TABLET | Freq: Every day | ORAL | 3 refills | Status: DC

## 2021-11-30 MED ORDER — ACETAMINOPHEN 325 MG PO TABS
650.0000 mg | ORAL_TABLET | Freq: Once | ORAL | Status: AC
  Administered 2021-11-30: 650 mg via ORAL
  Filled 2021-11-30: qty 2

## 2021-11-30 MED ORDER — SODIUM CHLORIDE 0.9 % IV SOLN
10.0000 mg | Freq: Once | INTRAVENOUS | Status: AC
  Administered 2021-11-30: 10 mg via INTRAVENOUS
  Filled 2021-11-30: qty 10

## 2021-11-30 MED ORDER — SODIUM CHLORIDE 0.9% FLUSH
10.0000 mL | INTRAVENOUS | Status: DC | PRN
  Administered 2021-11-30: 10 mL

## 2021-11-30 MED ORDER — DEXTROSE 5 % IV SOLN: Freq: Once | INTRAVENOUS | Status: AC

## 2021-11-30 MED ORDER — SODIUM CHLORIDE 0.9% FLUSH
10.0000 mL | Freq: Once | INTRAVENOUS | Status: AC
  Administered 2021-11-30: 10 mL

## 2021-11-30 MED ORDER — SODIUM CHLORIDE 0.9 % IV SOLN
150.0000 mg | Freq: Once | INTRAVENOUS | Status: AC
  Administered 2021-11-30: 150 mg via INTRAVENOUS
  Filled 2021-11-30: qty 150

## 2021-11-30 MED ORDER — DIPHENHYDRAMINE HCL 25 MG PO CAPS
25.0000 mg | ORAL_CAPSULE | Freq: Once | ORAL | Status: AC
  Administered 2021-11-30: 25 mg via ORAL
  Filled 2021-11-30: qty 1

## 2021-11-30 MED ORDER — HEPARIN SOD (PORK) LOCK FLUSH 100 UNIT/ML IV SOLN
500.0000 [IU] | Freq: Once | INTRAVENOUS | Status: AC | PRN
  Administered 2021-11-30: 500 [IU]

## 2021-11-30 MED ORDER — FAM-TRASTUZUMAB DERUXTECAN-NXKI CHEMO 100 MG IV SOLR
4.0500 mg/kg | Freq: Once | INTRAVENOUS | Status: AC
  Administered 2021-11-30: 360 mg via INTRAVENOUS
  Filled 2021-11-30: qty 18

## 2021-11-30 MED ORDER — CYANOCOBALAMIN 1000 MCG/ML IJ SOLN
1000.0000 ug | Freq: Once | INTRAMUSCULAR | Status: AC
  Administered 2021-11-30: 1000 ug via INTRAMUSCULAR
  Filled 2021-11-30: qty 1

## 2021-11-30 MED ORDER — PALONOSETRON HCL INJECTION 0.25 MG/5ML
0.2500 mg | Freq: Once | INTRAVENOUS | Status: AC
  Administered 2021-11-30: 0.25 mg via INTRAVENOUS
  Filled 2021-11-30: qty 5

## 2021-11-30 NOTE — Addendum Note (Signed)
Addended by: [name] on: 11/30/2021 10:47 AM   Modules accepted: Orders

## 2021-11-30 NOTE — Progress Notes (Signed)
Confirmed Enhertu dose w/ Dr. Lindi Adie (used credit to avoid waste for today's dose only).  Kennith Center, Pharm.D., CPP 11/30/2021'@9'$ :54 AM

## 2021-11-30 NOTE — Patient Instructions (Addendum)
[facility]COL[facility]  Discharge Instructions: Thank you for choosing [facility] to provide your oncology and hematology care.   If you have a lab appointment with the [facility], please go directly to the [facility] and check in at the registration area.   Wear comfortable clothing and clothing appropriate for easy access to any Portacath or PICC line.   We strive to give you quality time with your provider. You may need to reschedule your appointment if you arrive late (15 or more minutes).  Arriving late affects you and other patients whose appointments are after yours.  Also, if you miss three or more appointments without notifying the office, you may be dismissed from the clinic at the provider's discretion.      For prescription refill requests, have your pharmacy contact our office and allow 72 hours for refills to be completed.    Today you received the following chemotherapy and/or immunotherapy agent: Fam Traztusumab (Enhertu)    To help prevent nausea and vomiting after your treatment, we encourage you to take your nausea medication as directed.  BELOW ARE SYMPTOMS THAT SHOULD BE REPORTED IMMEDIATELY: *FEVER GREATER THAN 100.4 F (38 C) OR HIGHER *CHILLS OR SWEATING *NAUSEA AND VOMITING THAT IS NOT CONTROLLED WITH YOUR NAUSEA MEDICATION *UNUSUAL SHORTNESS OF BREATH *UNUSUAL BRUISING OR BLEEDING *URINARY PROBLEMS (pain or burning when urinating, or frequent urination) *BOWEL PROBLEMS (unusual diarrhea, constipation, pain near the anus) TENDERNESS IN MOUTH AND THROAT WITH OR WITHOUT PRESENCE OF ULCERS (sore throat, sores in mouth, or a toothache) UNUSUAL RASH, SWELLING OR PAIN  UNUSUAL VAGINAL DISCHARGE OR ITCHING   Items with * indicate a potential emergency and should be followed up as soon as possible or go to the Emergency Department if any problems should occur.  Please show the CHEMOTHERAPY ALERT CARD or IMMUNOTHERAPY ALERT CARD at  check-in to the Emergency Department and triage nurse.  Should you have questions after your visit or need to cancel or reschedule your appointment, please contact [facility]  Dept: [phone]  and follow the prompts.  Office hours are 8:00 a.m. to 4:30 p.m. Monday - Friday. Please note that voicemails left after 4:00 p.m. may not be returned until the following business day.  We are closed weekends and major holidays. You have access to a nurse at all times for urgent questions. Please call the main number to the clinic Dept: [phone] and follow the prompts.   For any non-urgent questions, you may also contact your provider using MyChart. We now offer e-Visits for anyone 53 and older to request care online for non-urgent symptoms. For details visit [email or web address].   Also download the MyChart app! Go to the app store, search "MyChart", open the app, select [facility], and log in with your MyChart username and password.  Masks are optional in the cancer centers. If you would like for your care team to wear a mask while they are taking care of you, please let them know. You may have one support person who is at least 66 years old accompany you for your appointments. Fam-Trastuzumab Deruxtecan Injection What is this medication? FAM-TRASTUZUMAB DERUXTECAN (fam-tras TOOZ eu mab DER ux TEE kan) treats some types of cancer. It works by blocking a protein that causes cancer cells to grow and multiply. This helps to slow or stop the spread of cancer cells. This medicine may be used for other purposes; ask your health care provider or pharmacist  if you have questions. COMMON BRAND NAME(S): ENHERTU What should I tell my care team before I take this medication? They need to know if you have any of these conditions: Heart disease Heart failure Infection, especially a viral infection, such as chickenpox, cold sores, or herpes Liver disease Lung or breathing  disease, such as asthma or COPD An unusual or allergic reaction to fam-trastuzumab deruxtecan, other medications, foods, dyes, or preservatives Pregnant or trying to get pregnant Breast-feeding How should I use this medication? This medication is injected into a vein. It is given by your care team in a hospital or clinic setting. A special MedGuide will be given to you before each treatment. Be sure to read this information carefully each time. Talk to your care team about the use of this medication in children. Special care may be needed. Overdosage: If you think you have taken too much of this medicine contact a poison control center or emergency room at once. NOTE: This medicine is only for you. Do not share this medicine with others. What if I miss a dose? It is important not to miss your dose. Call your care team if you are unable to keep an appointment. What may interact with this medication? Interactions are not expected. This list may not describe all possible interactions. Give your health care provider a list of all the medicines, herbs, non-prescription drugs, or dietary supplements you use. Also tell them if you smoke, drink alcohol, or use illegal drugs. Some items may interact with your medicine. What should I watch for while using this medication? Visit your care team for regular checks on your progress. Tell your care team if your symptoms do not start to get better or if they get worse. Your condition will be monitored carefully while you are receiving this medication. Do not become pregnant while taking this medication or for 7 months after stopping it. Women should inform their care team if they wish to become pregnant or think they might be pregnant. Men should not father a child while taking this medication and for 4 months after stopping it. There is potential for serious side effects to an unborn child. Talk to your care team for more information. Do not breast-feed an  infant while taking this medication or for 7 months after the last dose. This medication has caused decreased sperm counts in some men. This may make it more difficult to father a child. Talk to your care team if you are concerned about your fertility. This medication may increase your risk to bruise or bleed. Call your care team if you notice any unusual bleeding. Be careful brushing or flossing your teeth or using a toothpick because you may get an infection or bleed more easily. If you have any dental work done, tell your dentist you are receiving this medication. This medication may cause dry eyes and blurred vision. If you wear contact lenses, you may feel some discomfort. Lubricating eye drops may help. See your care team if the problem does not go away or is severe. This medication may increase your risk of getting an infection. Call your care team for advice if you get a fever, chills, sore throat, or other symptoms of a cold or flu. Do not treat yourself. Try to avoid being around people who are sick. Avoid taking medications that contain aspirin, acetaminophen, ibuprofen, naproxen, or ketoprofen unless instructed by your care team. These medications may hide a fever. What side effects may I notice from receiving  this medication? Side effects that you should report to your care team as soon as possible: Allergic reactions--skin rash, itching, hives, swelling of the face, lips, tongue, or throat Dry cough, shortness of breath or trouble breathing Infection--fever, chills, cough, sore throat, wounds that don't heal, pain or trouble when passing urine, general feeling of discomfort or being unwell Heart failure--shortness of breath, swelling of the ankles, feet, or hands, sudden weight gain, unusual weakness or fatigue Unusual bruising or bleeding Side effects that usually do not require medical attention (report these to your care team if they continue or are  bothersome): Constipation Diarrhea Hair loss Muscle pain Nausea Vomiting This list may not describe all possible side effects. Call your doctor for medical advice about side effects. You may report side effects to FDA at 1-800-FDA-1088. Where should I keep my medication? This medication is given in a hospital or clinic. It will not be stored at home. NOTE: This sheet is a summary. It may not cover all possible information. If you have questions about this medicine, talk to your doctor, pharmacist, or health care provider.  2023 Elsevier/Gold Standard (2020-09-02 00:00:00) Vitamin B12 Injection What is this medication? Vitamin B12 (VAHY tuh min B12) prevents and treats low vitamin B12 levels in your body. It is used in people who do not get enough vitamin B12 from their diet or when their digestive tract does not absorb enough. Vitamin B12 plays an important role in maintaining the health of your nervous system and red blood cells. This medicine may be used for other purposes; ask your health care provider or pharmacist if you have questions. COMMON BRAND NAME(S): B-12 Compliance Kit, B-12 Injection Kit, Cyomin, Dodex, LA-12, Nutri-Twelve, Physicians EZ Use B-12, Primabalt What should I tell my care team before I take this medication? They need to know if you have any of these conditions: Kidney disease Leber's disease Megaloblastic anemia An unusual or allergic reaction to cyanocobalamin, cobalt, other medications, foods, dyes, or preservatives Pregnant or trying to get pregnant Breast-feeding How should I use this medication? This medication is injected into a muscle or deeply under the skin. It is usually given in a clinic or care team's office. However, your care team may teach you how to inject yourself. Follow all instructions. Talk to your care team about the use of this medication in children. Special care may be needed. Overdosage: If you think you have taken too much of this  medicine contact a poison control center or emergency room at once. NOTE: This medicine is only for you. Do not share this medicine with others. What if I miss a dose? If you are given your dose at a clinic or care team's office, call to reschedule your appointment. If you give your own injections, and you miss a dose, take it as soon as you can. If it is almost time for your next dose, take only that dose. Do not take double or extra doses. What may interact with this medication? Alcohol Colchicine This list may not describe all possible interactions. Give your health care provider a list of all the medicines, herbs, non-prescription drugs, or dietary supplements you use. Also tell them if you smoke, drink alcohol, or use illegal drugs. Some items may interact with your medicine. What should I watch for while using this medication? Visit your care team regularly. You may need blood work done while you are taking this medication. You may need to follow a special diet. Talk to your care team.  Limit your alcohol intake and avoid smoking to get the best benefit. What side effects may I notice from receiving this medication? Side effects that you should report to your care team as soon as possible: Allergic reactions--skin rash, itching, hives, swelling of the face, lips, tongue, or throat Swelling of the ankles, hands, or feet Trouble breathing Side effects that usually do not require medical attention (report to your care team if they continue or are bothersome): Diarrhea This list may not describe all possible side effects. Call your doctor for medical advice about side effects. You may report side effects to FDA at 1-800-FDA-1088. Where should I keep my medication? Keep out of the reach of children. Store at room temperature between 15 and 30 degrees C (59 and 85 degrees F). Protect from light. Throw away any unused medication after the expiration date. NOTE: This sheet is a summary. It may not  cover all possible information. If you have questions about this medicine, talk to your doctor, pharmacist, or health care provider.  2023 Elsevier/Gold Standard (2007-02-10 00:00:00)

## 2021-11-30 NOTE — Assessment & Plan Note (Signed)
08/04/20: Right mastectomy (Dr. 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 Neoadjuvant chemotherapy with TCH Perjeta 6 cycles followed by Kadcyla maintenance discontinued 11/11/2020 because of peripheral neuropathy 2. Followed by mastectomy with sentinel lymph node study 08/04/20 3. Followed by adjuvant radiation therapy 09/18/2020- 11/04/2020 4.  Decided not to do antiestrogen therapy because her ER was 0% on final path. 5.  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 Bone scan 08/03/2021: No definite evidence of metastatic disease  Echocardiogram 07/22/2021: EF 60 to 65%    Current treatment: Cycle 7 Enhertu Enhertu toxicities:  1. Fatigue that lasted for 5 days, hair thinning 2. nausea: Takes: Zofran and Compazine, added Emend to her regimen.  Markedly better 3.  Insomnia 4.  Worsening neuropathy: Reduced the dosage of Enhertu, stable   CT of the chest: She would like to wait until after Christmas for next set of scans. This cutaneous metastases are improving and therefore we are fine with waiting till later for the scan.   Return to clinic every 3 weeks for [facility]

## 2021-12-21 ENCOUNTER — Other Ambulatory Visit: Payer: Managed Care, Other (non HMO)

## 2021-12-21 ENCOUNTER — Ambulatory Visit: Payer: Managed Care, Other (non HMO)

## 2021-12-21 ENCOUNTER — Ambulatory Visit: Payer: Managed Care, Other (non HMO) | Admitting: Hematology and Oncology

## 2021-12-28 MED FILL — Dexamethasone Sodium Phosphate Inj 100 MG/10ML: INTRAMUSCULAR | Qty: 1 | Status: AC

## 2021-12-28 MED FILL — Fosaprepitant Dimeglumine For IV Infusion 150 MG (Base Eq): INTRAVENOUS | Qty: 5 | Status: AC

## 2021-12-29 ENCOUNTER — Other Ambulatory Visit: Payer: Self-pay

## 2021-12-29 ENCOUNTER — Inpatient Hospital Stay: Payer: Medicare Other | Attending: Hematology and Oncology

## 2021-12-29 ENCOUNTER — Inpatient Hospital Stay: Payer: Medicare Other

## 2021-12-29 ENCOUNTER — Ambulatory Visit: Payer: Managed Care, Other (non HMO) | Admitting: Hematology and Oncology

## 2021-12-29 VITALS — BP 164/90 | HR 88 | Temp 98.0°F | Resp 16 | Ht 66.0 in | Wt 200.1 lb

## 2021-12-29 DIAGNOSIS — Z5112 Encounter for antineoplastic immunotherapy: Secondary | ICD-10-CM | POA: Diagnosis present

## 2021-12-29 DIAGNOSIS — R5383 Other fatigue: Secondary | ICD-10-CM

## 2021-12-29 DIAGNOSIS — Z170 Estrogen receptor positive status [ER+]: Secondary | ICD-10-CM

## 2021-12-29 DIAGNOSIS — C50411 Malignant neoplasm of upper-outer quadrant of right female breast: Secondary | ICD-10-CM | POA: Insufficient documentation

## 2021-12-29 LAB — CMP (CANCER CENTER ONLY)
ALT: 36 U/L (ref 0–44)
AST: 33 U/L (ref 15–41)
Albumin: 3.7 g/dL (ref 3.5–5.0)
Alkaline Phosphatase: 109 U/L (ref 38–126)
Anion gap: 6 (ref 5–15)
BUN: 10 mg/dL (ref 8–23)
CO2: 26 mmol/L (ref 22–32)
Calcium: 9.5 mg/dL (ref 8.9–10.3)
Chloride: 104 mmol/L (ref 98–111)
Creatinine: 0.71 mg/dL (ref 0.44–1.00)
GFR, Estimated: >60 mL/min (ref >60)
Glucose, Bld: 305 mg/dL — ABNORMAL HIGH (ref 70–99)
Potassium: 3.8 mmol/L (ref 3.5–5.1)
Sodium: 136 mmol/L (ref 135–145)
Total Bilirubin: 0.3 mg/dL (ref 0.3–1.2)
Total Protein: 6.8 g/dL (ref 6.5–8.1)

## 2021-12-29 LAB — CBC WITH DIFFERENTIAL (CANCER CENTER ONLY)
Abs Immature Granulocytes: 0.02 10*3/uL (ref 0.00–0.07)
Basophils Absolute: 0.1 10*3/uL (ref 0.0–0.1)
Basophils Relative: 1 %
Eosinophils Absolute: 0.1 10*3/uL (ref 0.0–0.5)
Eosinophils Relative: 2 %
HCT: 38 % (ref 36.0–46.0)
Hemoglobin: 12.9 g/dL (ref 12.0–15.0)
Immature Granulocytes: 0 %
Lymphocytes Relative: 22 %
Lymphs Abs: 1.3 10*3/uL (ref 0.7–4.0)
MCH: 33.1 pg (ref 26.0–34.0)
MCHC: 33.9 g/dL (ref 30.0–36.0)
MCV: 97.4 fL (ref 80.0–100.0)
Monocytes Absolute: 0.4 10*3/uL (ref 0.1–1.0)
Monocytes Relative: 8 %
Neutro Abs: 3.9 10*3/uL (ref 1.7–7.7)
Neutrophils Relative %: 67 %
Platelet Count: 197 10*3/uL (ref 150–400)
RBC: 3.9 MIL/uL (ref 3.87–5.11)
RDW: 13.1 % (ref 11.5–15.5)
WBC Count: 5.9 10*3/uL (ref 4.0–10.5)
nRBC: 0 % (ref 0.0–0.2)

## 2021-12-29 MED ORDER — HEPARIN SOD (PORK) LOCK FLUSH 100 UNIT/ML IV SOLN
500.0000 [IU] | Freq: Once | INTRAVENOUS | Status: AC | PRN
  Administered 2021-12-29: 500 [IU]

## 2021-12-29 MED ORDER — SODIUM CHLORIDE 0.9 % IV SOLN
10.0000 mg | Freq: Once | INTRAVENOUS | Status: AC
  Administered 2021-12-29: 10 mg via INTRAVENOUS
  Filled 2021-12-29: qty 10

## 2021-12-29 MED ORDER — SODIUM CHLORIDE 0.9 % IV SOLN
150.0000 mg | Freq: Once | INTRAVENOUS | Status: AC
  Administered 2021-12-29: 150 mg via INTRAVENOUS
  Filled 2021-12-29: qty 150

## 2021-12-29 MED ORDER — FAM-TRASTUZUMAB DERUXTECAN-NXKI CHEMO 100 MG IV SOLR
4.5000 mg/kg | Freq: Once | INTRAVENOUS | Status: AC
  Administered 2021-12-29: 400 mg via INTRAVENOUS
  Filled 2021-12-29: qty 20

## 2021-12-29 MED ORDER — ACETAMINOPHEN 325 MG PO TABS
650.0000 mg | ORAL_TABLET | Freq: Once | ORAL | Status: AC
  Administered 2021-12-29: 650 mg via ORAL
  Filled 2021-12-29: qty 2

## 2021-12-29 MED ORDER — SODIUM CHLORIDE 0.9% FLUSH
10.0000 mL | INTRAVENOUS | Status: DC | PRN
  Administered 2021-12-29: 10 mL

## 2021-12-29 MED ORDER — DIPHENHYDRAMINE HCL 25 MG PO CAPS
25.0000 mg | ORAL_CAPSULE | Freq: Once | ORAL | Status: AC
  Administered 2021-12-29: 25 mg via ORAL
  Filled 2021-12-29: qty 1

## 2021-12-29 MED ORDER — SODIUM CHLORIDE 0.9% FLUSH
10.0000 mL | Freq: Once | INTRAVENOUS | Status: AC
  Administered 2021-12-29: 10 mL

## 2021-12-29 MED ORDER — PALONOSETRON HCL INJECTION 0.25 MG/5ML
0.2500 mg | Freq: Once | INTRAVENOUS | Status: AC
  Administered 2021-12-29: 0.25 mg via INTRAVENOUS
  Filled 2021-12-29: qty 5

## 2021-12-29 MED ORDER — CYANOCOBALAMIN 1000 MCG/ML IJ SOLN
1000.0000 ug | Freq: Once | INTRAMUSCULAR | Status: AC
  Administered 2021-12-29: 1000 ug via INTRAMUSCULAR
  Filled 2021-12-29: qty 1

## 2021-12-29 MED ORDER — DEXTROSE 5 % IV SOLN: Freq: Once | INTRAVENOUS | Status: AC

## 2021-12-29 NOTE — Patient Instructions (Signed)
[facility]COL[facility]  Discharge Instructions: Thank you for choosing [facility] to provide your oncology and hematology care.   If you have a lab appointment with the [facility], please go directly to the [facility] and check in at the registration area.   Wear comfortable clothing and clothing appropriate for easy access to any Portacath or PICC line.   We strive to give you quality time with your provider. You may need to reschedule your appointment if you arrive late (15 or more minutes).  Arriving late affects you and other patients whose appointments are after yours.  Also, if you miss three or more appointments without notifying the office, you may be dismissed from the clinic at the provider's discretion.      For prescription refill requests, have your pharmacy contact our office and allow 72 hours for refills to be completed.    Today you received the following chemotherapy and/or immunotherapy agents: Enhertu.      To help prevent nausea and vomiting after your treatment, we encourage you to take your nausea medication as directed.  BELOW ARE SYMPTOMS THAT SHOULD BE REPORTED IMMEDIATELY: *FEVER GREATER THAN 100.4 F (38 C) OR HIGHER *CHILLS OR SWEATING *NAUSEA AND VOMITING THAT IS NOT CONTROLLED WITH YOUR NAUSEA MEDICATION *UNUSUAL SHORTNESS OF BREATH *UNUSUAL BRUISING OR BLEEDING *URINARY PROBLEMS (pain or burning when urinating, or frequent urination) *BOWEL PROBLEMS (unusual diarrhea, constipation, pain near the anus) TENDERNESS IN MOUTH AND THROAT WITH OR WITHOUT PRESENCE OF ULCERS (sore throat, sores in mouth, or a toothache) UNUSUAL RASH, SWELLING OR PAIN  UNUSUAL VAGINAL DISCHARGE OR ITCHING   Items with * indicate a potential emergency and should be followed up as soon as possible or go to the Emergency Department if any problems should occur.  Please show the CHEMOTHERAPY ALERT CARD or IMMUNOTHERAPY ALERT CARD at check-in to  the Emergency Department and triage nurse.  Should you have questions after your visit or need to cancel or reschedule your appointment, please contact [facility]  Dept: [phone]  and follow the prompts.  Office hours are 8:00 a.m. to 4:30 p.m. Monday - Friday. Please note that voicemails left after 4:00 p.m. may not be returned until the following business day.  We are closed weekends and major holidays. You have access to a nurse at all times for urgent questions. Please call the main number to the clinic Dept: [phone] and follow the prompts.   For any non-urgent questions, you may also contact your provider using MyChart. We now offer e-Visits for anyone 78 and older to request care online for non-urgent symptoms. For details visit [email or web address].   Also download the MyChart app! Go to the app store, search "MyChart", open the app, select [facility], and log in with your MyChart username and password.

## 2021-12-30 ENCOUNTER — Telehealth: Payer: Self-pay | Admitting: Hematology and Oncology

## 2021-12-30 NOTE — Telephone Encounter (Signed)
Scheduled appointments per WQ. Patient is aware. 

## 2021-12-31 ENCOUNTER — Other Ambulatory Visit: Payer: Self-pay

## 2022-01-13 ENCOUNTER — Encounter: Payer: Self-pay | Admitting: Hematology and Oncology

## 2022-01-14 ENCOUNTER — Ambulatory Visit (HOSPITAL_COMMUNITY)
Admission: RE | Admit: 2022-01-14 | Discharge: 2022-01-14 | Disposition: A | Discharge status: Home / Self Care | Payer: Medicare Other | Source: Ambulatory Visit | Attending: Hematology and Oncology | Admitting: Hematology and Oncology

## 2022-01-14 DIAGNOSIS — Z170 Estrogen receptor positive status [ER+]: Secondary | ICD-10-CM | POA: Insufficient documentation

## 2022-01-14 DIAGNOSIS — C50411 Malignant neoplasm of upper-outer quadrant of right female breast: Secondary | ICD-10-CM | POA: Insufficient documentation

## 2022-01-14 MED ORDER — SODIUM CHLORIDE (PF) 0.9 % IJ SOLN
INTRAMUSCULAR | Status: AC
  Filled 2022-01-14: qty 50

## 2022-01-14 MED ORDER — HEPARIN SOD (PORK) LOCK FLUSH 100 UNIT/ML IV SOLN
500.0000 [IU] | Freq: Once | INTRAVENOUS | Status: AC
  Administered 2022-01-14: 500 [IU] via INTRAVENOUS

## 2022-01-14 MED ORDER — HEPARIN SOD (PORK) LOCK FLUSH 100 UNIT/ML IV SOLN
INTRAVENOUS | Status: AC
  Filled 2022-01-14: qty 5

## 2022-01-14 MED ORDER — IOHEXOL 300 MG/ML  SOLN
75.0000 mL | Freq: Once | INTRAMUSCULAR | Status: AC | PRN
  Administered 2022-01-14: 75 mL via INTRAVENOUS

## 2022-01-18 MED FILL — Dexamethasone Sodium Phosphate Inj 100 MG/10ML: INTRAMUSCULAR | Qty: 1 | Status: AC

## 2022-01-18 MED FILL — Fosaprepitant Dimeglumine For IV Infusion 150 MG (Base Eq): INTRAVENOUS | Qty: 5 | Status: AC

## 2022-01-19 ENCOUNTER — Inpatient Hospital Stay (HOSPITAL_BASED_OUTPATIENT_CLINIC_OR_DEPARTMENT_OTHER): Payer: Medicare Other | Admitting: Adult Health

## 2022-01-19 ENCOUNTER — Other Ambulatory Visit: Payer: Self-pay | Admitting: *Deleted

## 2022-01-19 ENCOUNTER — Encounter: Payer: Self-pay | Admitting: Adult Health

## 2022-01-19 ENCOUNTER — Inpatient Hospital Stay: Payer: Medicare Other | Attending: Hematology and Oncology

## 2022-01-19 ENCOUNTER — Inpatient Hospital Stay: Payer: Medicare Other

## 2022-01-19 VITALS — BP 157/84 | HR 87 | Temp 97.5°F | Resp 16 | Wt 201.1 lb

## 2022-01-19 VITALS — BP 153/79 | HR 73 | Resp 18

## 2022-01-19 DIAGNOSIS — G620 Drug-induced polyneuropathy: Secondary | ICD-10-CM

## 2022-01-19 DIAGNOSIS — Z170 Estrogen receptor positive status [ER+]: Secondary | ICD-10-CM

## 2022-01-19 DIAGNOSIS — C773 Secondary and unspecified malignant neoplasm of axilla and upper limb lymph nodes: Secondary | ICD-10-CM | POA: Diagnosis not present

## 2022-01-19 DIAGNOSIS — C50411 Malignant neoplasm of upper-outer quadrant of right female breast: Secondary | ICD-10-CM | POA: Insufficient documentation

## 2022-01-19 DIAGNOSIS — I10 Essential (primary) hypertension: Secondary | ICD-10-CM | POA: Insufficient documentation

## 2022-01-19 DIAGNOSIS — E119 Type 2 diabetes mellitus without complications: Secondary | ICD-10-CM

## 2022-01-19 DIAGNOSIS — R5383 Other fatigue: Secondary | ICD-10-CM

## 2022-01-19 DIAGNOSIS — T451X5A Adverse effect of antineoplastic and immunosuppressive drugs, initial encounter: Secondary | ICD-10-CM

## 2022-01-19 DIAGNOSIS — Z5112 Encounter for antineoplastic immunotherapy: Secondary | ICD-10-CM | POA: Insufficient documentation

## 2022-01-19 LAB — CBC WITH DIFFERENTIAL (CANCER CENTER ONLY)
Abs Immature Granulocytes: 0.01 10*3/uL (ref 0.00–0.07)
Basophils Absolute: 0.1 10*3/uL (ref 0.0–0.1)
Basophils Relative: 2 %
Eosinophils Absolute: 0.2 10*3/uL (ref 0.0–0.5)
Eosinophils Relative: 3 %
HCT: 38.9 % (ref 36.0–46.0)
Hemoglobin: 13.4 g/dL (ref 12.0–15.0)
Immature Granulocytes: 0 %
Lymphocytes Relative: 22 %
Lymphs Abs: 1.3 10*3/uL (ref 0.7–4.0)
MCH: 33.3 pg (ref 26.0–34.0)
MCHC: 34.4 g/dL (ref 30.0–36.0)
MCV: 96.5 fL (ref 80.0–100.0)
Monocytes Absolute: 0.5 10*3/uL (ref 0.1–1.0)
Monocytes Relative: 8 %
Neutro Abs: 3.8 10*3/uL (ref 1.7–7.7)
Neutrophils Relative %: 65 %
Platelet Count: 241 10*3/uL (ref 150–400)
RBC: 4.03 MIL/uL (ref 3.87–5.11)
RDW: 13.4 % (ref 11.5–15.5)
WBC Count: 5.9 10*3/uL (ref 4.0–10.5)
nRBC: 0 % (ref 0.0–0.2)

## 2022-01-19 LAB — CMP (CANCER CENTER ONLY)
ALT: 39 U/L (ref 0–44)
AST: 39 U/L (ref 15–41)
Albumin: 3.7 g/dL (ref 3.5–5.0)
Alkaline Phosphatase: 116 U/L (ref 38–126)
Anion gap: 6 (ref 5–15)
BUN: 9 mg/dL (ref 8–23)
CO2: 26 mmol/L (ref 22–32)
Calcium: 9.6 mg/dL (ref 8.9–10.3)
Chloride: 105 mmol/L (ref 98–111)
Creatinine: 0.71 mg/dL (ref 0.44–1.00)
GFR, Estimated: >60 mL/min (ref >60)
Glucose, Bld: 221 mg/dL — ABNORMAL HIGH (ref 70–99)
Potassium: 3.5 mmol/L (ref 3.5–5.1)
Sodium: 137 mmol/L (ref 135–145)
Total Bilirubin: 0.4 mg/dL (ref 0.3–1.2)
Total Protein: 6.6 g/dL (ref 6.5–8.1)

## 2022-01-19 LAB — HEMOGLOBIN A1C
Hgb A1c MFr Bld: 8.5 % — ABNORMAL HIGH (ref 4.8–5.6)
Mean Plasma Glucose: 197.25 mg/dL

## 2022-01-19 MED ORDER — DIPHENHYDRAMINE HCL 25 MG PO CAPS
25.0000 mg | ORAL_CAPSULE | Freq: Once | ORAL | Status: AC
  Administered 2022-01-19: 25 mg via ORAL
  Filled 2022-01-19: qty 1

## 2022-01-19 MED ORDER — ACETAMINOPHEN 325 MG PO TABS
650.0000 mg | ORAL_TABLET | Freq: Once | ORAL | Status: AC
  Administered 2022-01-19: 650 mg via ORAL
  Filled 2022-01-19: qty 2

## 2022-01-19 MED ORDER — SODIUM CHLORIDE 0.9% FLUSH
10.0000 mL | INTRAVENOUS | Status: DC | PRN
  Administered 2022-01-19: 10 mL

## 2022-01-19 MED ORDER — SODIUM CHLORIDE 0.9 % IV SOLN
10.0000 mg | Freq: Once | INTRAVENOUS | Status: AC
  Administered 2022-01-19: 10 mg via INTRAVENOUS
  Filled 2022-01-19: qty 10

## 2022-01-19 MED ORDER — BLOOD GLUCOSE MONITOR KIT: PACK | 0 refills | Status: DC

## 2022-01-19 MED ORDER — DEXTROSE 5 % IV SOLN: Freq: Once | INTRAVENOUS | Status: AC

## 2022-01-19 MED ORDER — HEPARIN SOD (PORK) LOCK FLUSH 100 UNIT/ML IV SOLN
500.0000 [IU] | Freq: Once | INTRAVENOUS | Status: AC | PRN
  Administered 2022-01-19: 500 [IU]

## 2022-01-19 MED ORDER — PALONOSETRON HCL INJECTION 0.25 MG/5ML
0.2500 mg | Freq: Once | INTRAVENOUS | Status: AC
  Administered 2022-01-19: 0.25 mg via INTRAVENOUS
  Filled 2022-01-19: qty 5

## 2022-01-19 MED ORDER — SODIUM CHLORIDE 0.9% FLUSH
10.0000 mL | Freq: Once | INTRAVENOUS | Status: AC
  Administered 2022-01-19: 10 mL

## 2022-01-19 MED ORDER — CYANOCOBALAMIN 1000 MCG/ML IJ SOLN
1000.0000 ug | Freq: Once | INTRAMUSCULAR | Status: AC
  Administered 2022-01-19: 1000 ug via INTRAMUSCULAR
  Filled 2022-01-19: qty 1

## 2022-01-19 MED ORDER — SODIUM CHLORIDE 0.9 % IV SOLN
150.0000 mg | Freq: Once | INTRAVENOUS | Status: AC
  Administered 2022-01-19: 150 mg via INTRAVENOUS
  Filled 2022-01-19: qty 150

## 2022-01-19 MED ORDER — FAM-TRASTUZUMAB DERUXTECAN-NXKI CHEMO 100 MG IV SOLR
4.4000 mg/kg | Freq: Once | INTRAVENOUS | Status: AC
  Administered 2022-01-19: 400 mg via INTRAVENOUS
  Filled 2022-01-19: qty 20

## 2022-01-19 MED ORDER — METFORMIN HCL ER 500 MG PO TB24: 500.0000 mg | ORAL_TABLET | Freq: Two times a day (BID) | ORAL | 1 refills | Status: DC

## 2022-01-19 NOTE — Assessment & Plan Note (Signed)
I reviewed with her that her blood pressure is elevated and we are referring to cardiology for further evaluation and management that will occur as a look at her heart risk and bigger picture.  I will not start any medication at this time but will defer to cardiology.  We did discuss sodium limitation today.

## 2022-01-19 NOTE — Assessment & Plan Note (Signed)
She continues on gabapentin twice daily and is still experiencing neuropathy.  I reviewed that it is quite possible her elevated sugars over the past few months are contributing to the neuropathy.  She will take the gabapentin 600 mg twice daily and she is considering cutting the tablet in half since it is scored to 300 mg in the middle of the day.  I am hopeful that we may see improvement in her symptoms as her diabetes will begin treatment today.

## 2022-01-19 NOTE — Patient Instructions (Signed)
Diabetes Mellitus and Nutrition, Adult When you have diabetes, or diabetes mellitus, it is very important to have healthy eating habits because your blood sugar (glucose) levels are greatly affected by what you eat and drink. Eating healthy foods in the right amounts, at about the same times every day, can help you: Manage your blood glucose. Lower your risk of heart disease. Improve your blood pressure. Reach or maintain a healthy weight. What can affect my meal plan? Every person with diabetes is different, and each person has different needs for a meal plan. Your health care provider may recommend that you work with a dietitian to make a meal plan that is best for you. Your meal plan may vary depending on factors such as: The calories you need. The medicines you take. Your weight. Your blood glucose, blood pressure, and cholesterol levels. Your activity level. Other health conditions you have, such as heart or kidney disease. How do carbohydrates affect me? Carbohydrates, also called carbs, affect your blood glucose level more than any other type of food. Eating carbs raises the amount of glucose in your blood. It is important to know how many carbs you can safely have in each meal. This is different for every person. Your dietitian can help you calculate how many carbs you should have at each meal and for each snack. How does alcohol affect me? Alcohol can cause a decrease in blood glucose (hypoglycemia), especially if you use insulin or take certain diabetes medicines by mouth. Hypoglycemia can be a life-threatening condition. Symptoms of hypoglycemia, such as sleepiness, dizziness, and confusion, are similar to symptoms of having too much alcohol. Do not drink alcohol if: Your health care provider tells you not to drink. You are pregnant, may be pregnant, or are planning to become pregnant. If you drink alcohol: Limit how much you have to: 0-1 drink a day for women. 0-2 drinks a day  for men. Know how much alcohol is in your drink. In the [place] one drink equals one 12 oz bottle of beer (355 mL), one 5 oz glass of wine (148 mL), or one 1 oz glass of hard liquor (44 mL). Keep yourself hydrated with water, diet soda, or unsweetened iced tea. Keep in mind that regular soda, juice, and other mixers may contain a lot of sugar and must be counted as carbs. What are tips for following this plan?  Reading food labels Start by checking the serving size on the Nutrition Facts label of packaged foods and drinks. The number of calories and the amount of carbs, fats, and other nutrients listed on the label are based on one serving of the item. Many items contain more than one serving per package. Check the total grams (g) of carbs in one serving. Check the number of grams of saturated fats and trans fats in one serving. Choose foods that have a low amount or none of these fats. Check the number of milligrams (mg) of salt (sodium) in one serving. Most people should limit total sodium intake to less than 2,300 mg per day. Always check the nutrition information of foods labeled as "low-fat" or "nonfat." These foods may be higher in added sugar or refined carbs and should be avoided. Talk to your dietitian to identify your daily goals for nutrients listed on the label. Shopping Avoid buying canned, pre-made, or processed foods. These foods tend to be high in fat, sodium, and added sugar. Shop around the outside edge of the grocery store. This is where you   will most often find fresh fruits and vegetables, bulk grains, fresh meats, and fresh dairy products. Cooking Use low-heat cooking methods, such as baking, instead of high-heat cooking methods, such as deep frying. Cook using healthy oils, such as olive, canola, or sunflower oil. Avoid cooking with butter, cream, or high-fat meats. Meal planning Eat meals and snacks regularly, preferably at the same times every day. Avoid going long periods  of time without eating. Eat foods that are high in fiber, such as fresh fruits, vegetables, beans, and whole grains. Eat 4-6 oz (112-168 g) of lean protein each day, such as lean meat, chicken, fish, eggs, or tofu. One ounce (oz) (28 g) of lean protein is equal to: 1 oz (28 g) of meat, chicken, or fish. 1 egg.  cup (62 g) of tofu. Eat some foods each day that contain healthy fats, such as avocado, nuts, seeds, and fish. What foods should I eat? Fruits Berries. Apples. Oranges. Peaches. Apricots. Plums. Grapes. Mangoes. Papayas. Pomegranates. Kiwi. Cherries. Vegetables Leafy greens, including lettuce, spinach, kale, chard, collard greens, mustard greens, and cabbage. Beets. Cauliflower. Broccoli. Carrots. Green beans. Tomatoes. Peppers. Onions. Cucumbers. Brussels sprouts. Grains Whole grains, such as whole-wheat or whole-grain bread, crackers, tortillas, cereal, and pasta. Unsweetened oatmeal. Quinoa. Brown or wild rice. Meats and other proteins Seafood. Poultry without skin. Lean cuts of poultry and beef. Tofu. Nuts. Seeds. Dairy Low-fat or fat-free dairy products such as milk, yogurt, and cheese. The items listed above may not be a complete list of foods and beverages you can eat and drink. Contact a dietitian for more information. What foods should I avoid? Fruits Fruits canned with syrup. Vegetables Canned vegetables. Frozen vegetables with butter or cream sauce. Grains Refined white flour and flour products such as bread, pasta, snack foods, and cereals. Avoid all processed foods. Meats and other proteins Fatty cuts of meat. Poultry with skin. Breaded or fried meats. Processed meat. Avoid saturated fats. Dairy Full-fat yogurt, cheese, or milk. Beverages Sweetened drinks, such as soda or iced tea. The items listed above may not be a complete list of foods and beverages you should avoid. Contact a dietitian for more information. Questions to ask a health care provider Do I need  to meet with a certified diabetes care and education specialist? Do I need to meet with a dietitian? What number can I call if I have questions? When are the best times to check my blood glucose? Where to find more information: American Diabetes Association: diabetes.org Academy of Nutrition and Dietetics: eatright.[employer] of Diabetes and Digestive and Kidney Diseases: [employer] Association of Diabetes Care & Education Specialists: diabeteseducator.org Summary It is important to have healthy eating habits because your blood sugar (glucose) levels are greatly affected by what you eat and drink. It is important to use alcohol carefully. A healthy meal plan will help you manage your blood glucose and lower your risk of heart disease. Your health care provider may recommend that you work with a dietitian to make a meal plan that is best for you. This information is not intended to replace advice given to you by your health care provider. Make sure you discuss any questions you have with your health care provider. Document Revised: 07/24/2019 Document Reviewed: 07/24/2019 Elsevier Patient Education  [place]. Metformin Extended-Release Tablets What is this medication? METFORMIN (met FOR min) treats type 2 diabetes. It controls blood sugar (glucose) and helps your body use insulin effectively. This medication is often combined with changes to diet  and exercise. This medicine may be used for other purposes; ask your health care provider or pharmacist if you have questions. COMMON BRAND NAME(S): Fortamet, Glucophage XR, Glumetza What should I tell my care team before I take this medication? They need to know if you have any of these conditions: Anemia Dehydration Frequently drink alcohol Heart disease Kidney disease Liver disease Polycystic ovary syndrome Serious infection or injury Vomiting An unusual or allergic reaction to metformin, other medications, foods,  dyes, or preservatives Pregnant or trying to get pregnant Breastfeeding How should I use this medication? Take this medication by mouth with water. Take it as directed on the prescription label at the same time every day. Take it with food at the start of a meal. Do not cut, crush, or chew this medication. Swallow the tablets whole. Keep taking it unless your care team tells you to stop. Talk to your care team about the use of this medication in children. Special care may be needed. Overdosage: If you think you have taken too much of this medicine contact a poison control center or emergency room at once. NOTE: This medicine is only for you. Do not share this medicine with others. What if I miss a dose? If you miss a dose, take it as soon as you can. If it is almost time for your next dose, take only that dose. Do not take double or extra doses. What may interact with this medication? Do not take this medication with any of the following: Certain contrast medications given before X-rays, CT scans, MRI, or other procedures Dofetilide This medication may also interact with the following: Acetazolamide Alcohol Certain antivirals for HIV or hepatitis Certain medications for blood pressure, heart disease, irregular heart beat Cimetidine Dichlorphenamide Digoxin Diuretics Estrogen or progestin hormones Glycopyrrolate Isoniazid Lamotrigine Memantine Methazolamide Metoclopramide Midodrine Niacin Phenothiazines, such as chlorpromazine, mesoridazine, prochlorperazine, thioridazine Phenytoin Ranolazine Steroid medications, such as prednisone or cortisone Stimulant medications for attention disorders, weight loss, or to stay awake Thyroid medications Topiramate Trospium Vandetanib Zonisamide This list may not describe all possible interactions. Give your health care provider a list of all the medicines, herbs, non-prescription drugs, or dietary supplements you use. Also tell them if you  smoke, drink alcohol, or use illegal drugs. Some items may interact with your medicine. What should I watch for while using this medication? Visit your care team for regular checks on your progress. A test called the HbA1C (A1C) will be monitored. This is a simple blood test. It measures your blood sugar control over the last 2 to 3 months. You will receive this test every 3 to 6 months. Using this medication with insulin or a sulfonylurea may increase your risk of hypoglycemia. Learn how to check your blood sugar. Learn the symptoms of low and high blood sugar and how to manage them. Always carry a quick-source of sugar with you in case you have symptoms of low blood sugar. Examples include hard sugar candy or glucose tablets. Make sure others know that you can choke if you eat or drink when you develop serious symptoms of low blood sugar, such as seizures or unconsciousness. They must get medical help at once. Tell your care team if you have high blood sugar. You might need to change the dose of your medication. If you are sick or exercising more than usual, you might need to change the dose of your medication. Do not skip meals. Ask your care team if you should avoid alcohol. Many nonprescription cough  and cold products contain sugar or alcohol. These can affect blood sugar. This medication may cause you to ovulate, which may increase your chances of becoming pregnant. Talk with your care team about contraception while you are taking this medication. Contact your care team if you think you might be pregnant. The tablet shell for some brands of this medication does not dissolve. This is normal. The tablet shell may appear whole in the stool. This is not a cause for concern. If you are going to need surgery, an MRI, CT scan, or other procedure, tell your health care provider that you are taking this medication. You may need to stop taking this medication before the procedure. Wear a medical ID bracelet  or chain. Carry a card that describes your condition. List the medications and doses you take on the card. This medication may cause a decrease in folic acid and vitamin B12. You should make sure that you get enough vitamins while you are taking this medication. Discuss the foods you eat and the vitamins you take with your care team. What side effects may I notice from receiving this medication? Side effects that you should report to your care team as soon as possible: Allergic reactions--skin rash, itching, hives, swelling of the face, lips, tongue, or throat High lactic acid level--muscle pain or cramps, stomach pain, trouble breathing, general discomfort or fatigue Low vitamin B12 level--pain, tingling, or numbness in the hands or feet, muscle weakness, dizziness, confusion, difficulty concentrating Side effects that usually do not require medical attention (report to your care team if they continue or are bothersome): Diarrhea Gas Headache Metallic taste in mouth Nausea This list may not describe all possible side effects. Call your doctor for medical advice about side effects. You may report side effects to FDA at 1-800-FDA-1088. Where should I keep my medication? Keep out of the reach of children and pets. Store between 15 and 30 degrees C (59 and 86 degrees F). Protect from light. Get rid of any unused medication after the expiration date. To get rid of medications that are no longer needed or expired: Take the medication to a medication take-back program. Check with your pharmacy or law enforcement to find a location. If you cannot return the medication, check the label or package insert to see if the medication should be thrown out in the garbage or flushed down the toilet. If you are not sure, ask your care team. If it is safe to put in the trash, empty the medication out of the container. Mix the medication with cat litter, dirt, coffee grounds, or other unwanted substance. Seal the  mixture in a bag or container. Put it in the trash. NOTE: This sheet is a summary. It may not cover all possible information. If you have questions about this medicine, talk to your doctor, pharmacist, or health care provider.  2023 Elsevier/Gold Standard (2021-07-19 00:00:00)

## 2022-01-19 NOTE — Assessment & Plan Note (Signed)
Sabrina Woods is a 67 year old woman with metastatic breast cancer currently on treatment with Enhertu.  She has no clinical or radiographic signs of progression of her metastatic breast cancer.  We reviewed her restaging scans in detail.  She needs a repeat echocardiogram which I placed orders for today.  I reviewed her labs in detail and on review of her c-Met her glucose appears to have been elevated 200s to 300s for a lot of her past treatments.  Upon further investigation when she saw neurology an A1c was drawn which was 8.7.    I reviewed with her and her husband that in order for her to stay on the Enhertu for as long as it is working we need to ensure her heart functions optimally.  We discussed her high risk for cardiac disease and I placed a referral to our cardio oncologist, Dr. Harl Bowie.

## 2022-01-19 NOTE — Progress Notes (Signed)
Per Wilber Bihari NP OK to proceed with today with ECHO from 10/08/2021 left EF 60-65%. Pt to have updated ECHO prior to next tx.

## 2022-01-19 NOTE — Assessment & Plan Note (Signed)
I reviewed that she has type 2 diabetes.  Gave her detailed information about this.  We briefly reviewed diet recommendations and I placed orders for metformin XR twice daily.  I recommended healthy diet and exercise and I gave her a handout on both diet and diabetes along with metformin and her after visit summary.  We are rechecking an A1c today to get her baseline upon treatment initiation.

## 2022-01-19 NOTE — Progress Notes (Signed)
[facility] Follow up:    Sabrina Lose, MD [place] [id number]   DIAGNOSIS:  Cancer Staging  Malignant neoplasm of upper-outer quadrant of right breast in female, estrogen receptor positive ([place]) Staging form: Breast, AJCC 8th Edition - Clinical stage from 03/10/2020: Stage IIIA (cT3, cN1, cM0, G3, ER+, PR-, HER2+) - Signed by Sabrina Lose, MD on 03/10/2020 Stage prefix: Initial diagnosis - Pathologic stage from 08/04/2020: No Stage Recommended (ypTis (DCIS), pN2, cM0) - Signed by Gardenia Phlegm, NP on 02/05/2021 Stage prefix: Post-therapy   SUMMARY OF ONCOLOGIC HISTORY: Oncology History  Malignant neoplasm of upper-outer quadrant of right breast in female, estrogen receptor positive ([place])  03/10/2020 Initial Diagnosis   Palpable right breast mass x6 months.  Mammogram revealed left breast cysts (intraductal papilloma), 11 cm right breast mass and 2 masses 2.6 cm each in the right axilla: Biopsy grade 3 IDC ER 5% weak, PR 0%, Ki-67 25%, HER-2 3+ IHC positive   03/10/2020 Cancer Staging   Staging form: Breast, AJCC 8th Edition - Clinical stage from 03/10/2020: Stage IIIA (cT3, cN1, cM0, G3, ER+, PR-, HER2+) - Signed by Sabrina Lose, MD on 03/10/2020 Stage prefix: Initial diagnosis   03/19/2020 - 07/01/2020 Chemotherapy   [facility]   08/04/2020 Surgery   Right mastectomy (Dr. 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, high-grade DCIS with necrosis and calcifications, and 2 right axillary lymph nodes positive for metastatic carcinoma   08/04/2020 Cancer Staging   Staging form: Breast, AJCC 8th Edition - Pathologic stage from 08/04/2020: No Stage Recommended (ypTis (DCIS), pN2, cM0) - Signed by Gardenia Phlegm, NP on 02/05/2021 Stage prefix: Post-therapy   08/21/2020 - 10/23/2020 Chemotherapy   Discontinued due to peripheral neuropathy Patient is on Treatment Plan : BREAST  ADO-Trastuzumab Emtansine ([place]) q21d      08/2020 -  Anti-estrogen oral therapy    Decided not to do antiestrogen therapy because her ER was 0% on final path.   10/06/2020 - 11/04/2020 Radiation Therapy   Adjuvant radiation    Relapse/Recurrence   Right mastectomy area skin changes: Initially treated for shingles but since lesions did not improve skin biopsy was obtained on 07/14/2021: Infiltrating carcinoma consistent with breast origin CK7 and GA TA 3 positive, ER 0%, PR 0%, HER2 2+ by IHC, FISH negative ratio 1.56, copy #2.1   07/26/2021 -  Chemotherapy   Patient is on Treatment Plan : BREAST METASTATIC Fam-Trastuzumab Deruxtecan-nxki (Enhertu) (5.4) q21d     01/14/2022 Imaging   CT Chest: Slight decrease in size of the right lower lobe pulmonary nodule. Stable tiny non solid nodule in the superior segment of left lower lobe. No new pulmonary nodules. Stable surgical changes from right mastectomy and axillary lymph node dissection. No new developing lymph node enlargement or other mass lesion.  Chest port. Fatty liver infiltration. Aortic Atherosclerosis (ICD10-I70.0).     CURRENT THERAPY:  INTERVAL HISTORY: Sabrina Woods 67 y.o. female returns for follow-up of her metastatic breast cancer.  She is currently receiving Enhertu therapy.  She says for about a week following her treatment she feels tired and has a decreased appetite.   Her most recent restaging scans occurred on January 12 with CT chest that showed no progression of her breast cancer.  Additionally it did note aortic atherosclerosis.  Her most recent echocardiogram occurred on October 08, 2021 demonstrating a left ventricular ejection fraction of 60 to 65% and global longitudinal strain  was normal.  A-31.4% B-157/84 C-most recent records of cholesterol are 67 years old, current smoker D-no particular diet/undiagnosed and uncontrolled type 2 diabetes E-none  Sabrina Woods continues to experience neuropathy.  She decreased her  gabapentin to '600mg'$  PO BID, due to increased fatigue, but noted the neuroapthy was better controlled at '600mg'$  TID.    Patient Active Problem List   Diagnosis Date Noted   Essential (primary) hypertension 01/19/2022   Type 2 diabetes mellitus without complication, without long-term current use of insulin ([place]) 01/19/2022   Fatigue 04/08/2021   S/P mastectomy, right 08/04/2020   Chemotherapy-induced peripheral neuropathy ([place]) 05/20/2020   Malignant neoplasm of upper-outer quadrant of right breast in female, estrogen receptor positive ([place]) 03/10/2020   Fibroid    Smoker    Depression     has No Known Allergies.  MEDICAL HISTORY: Past Medical History:  Diagnosis Date   Acid reflux    Anxiety    Breast cancer ([place])    Breast cancer ([place]) 2022   Depression    HISTORY OF   Fibroid    PONV (postoperative nausea and vomiting)    Smoker     SURGICAL HISTORY: Past Surgical History:  Procedure Laterality Date   CESAREAN SECTION     X 2   MASTECTOMY     MASTECTOMY WITH AXILLARY LYMPH NODE DISSECTION Right 08/04/2020   Procedure: RIGHT MASTECTOMY WITH RIGHT AXILLARY LYMPH NODE DISSECTION;  Surgeon: Rolm Bookbinder, MD;  Location: [facility] OR;  Service: General;  Laterality: Right;   PORTACATH PLACEMENT Left 03/17/2020   Procedure: INSERTION PORT-A-CATH;  Surgeon: Rolm Bookbinder, MD;  Location: [facility];  Service: General;  Laterality: Left;   PORTACATH PLACEMENT Left 08/05/2021   Procedure: PORT PLACEMENT WITH ULTRASOUND GUIDANCE;  Surgeon: Rolm Bookbinder, MD;  Location: [facility];  Service: General;  Laterality: Left;  LMA    SOCIAL HISTORY: Social History   Socioeconomic History   Marital status: Married    Spouse name: Not on file   Number of children: Not on file   Years of education: Not on file   Highest education level: Not on file  Occupational History   Not on file  Tobacco Use   Smoking status: Every Day    Packs/day: 0.50     Types: Cigarettes   Smokeless tobacco: Never  Vaping Use   Vaping Use: Never used  Substance and Sexual Activity   Alcohol use: Yes    Comment: occasionally   Drug use: Never   Sexual activity: Yes    Birth control/protection: Post-menopausal  Other Topics Concern   Not on file  Social History Narrative   Right handed    Caffeine- 5-6 cups per day    Social Determinants of Health   Financial Resource Strain: Low Risk  (09/01/2020)   Overall Financial Resource Strain (CARDIA)    Difficulty of Paying Living Expenses: Not hard at all  Food Insecurity: No Food Insecurity (09/01/2020)   Hunger Vital Sign    Worried About Running Out of Food in the Last Year: Never true    [place] in the Last Year: Never true  Transportation Needs: No Transportation Needs (09/01/2020)   PRAPARE - Hydrologist (Medical): No    Lack of Transportation (Non-Medical): No  Physical Activity: Not on file  Stress: Not on file  Social Connections: Not on file  Intimate Partner Violence: Not on file    FAMILY HISTORY: Family History  Problem Relation Age of Onset   Breast cancer Mother    Diabetes Mother    Hypertension Mother    Hypertension Father    Hypertension Sister    Hypertension Brother     Review of Systems  Constitutional:  Positive for appetite change and fatigue. Negative for chills, fever and unexpected weight change.  HENT:   Negative for hearing loss, lump/mass and trouble swallowing.   Eyes:  Negative for eye problems and icterus.  Respiratory:  Negative for chest tightness, cough and shortness of breath.   Cardiovascular:  Negative for chest pain, leg swelling and palpitations.  Gastrointestinal:  Negative for abdominal distention, abdominal pain, constipation, diarrhea, nausea and vomiting.  Endocrine: Negative for hot flashes.  Genitourinary:  Negative for difficulty urinating.   Musculoskeletal:  Negative for arthralgias.  Skin:  Negative  for itching and rash.  Neurological:  Positive for numbness. Negative for dizziness, extremity weakness and headaches.  Hematological:  Negative for adenopathy. Does not bruise/bleed easily.  Psychiatric/Behavioral:  Negative for depression. The patient is not nervous/anxious.      PHYSICAL EXAMINATION  ECOG PERFORMANCE STATUS: 1 - Symptomatic but completely ambulatory  Vitals:   01/19/22 0916  BP: (!) 157/84  Pulse: 87  Resp: 16  Temp: (!) 97.5 F (36.4 C)  SpO2: 97%    Physical Exam Constitutional:      General: She is not in acute distress.    Appearance: Normal appearance. She is not toxic-appearing.  HENT:     Head: Normocephalic and atraumatic.  Eyes:     General: No scleral icterus. Cardiovascular:     Rate and Rhythm: Normal rate and regular rhythm.     Pulses: Normal pulses.     Heart sounds: Normal heart sounds.  Pulmonary:     Effort: Pulmonary effort is normal.     Breath sounds: Normal breath sounds.  Abdominal:     General: Abdomen is flat. Bowel sounds are normal. There is no distension.     Palpations: Abdomen is soft.     Tenderness: There is no abdominal tenderness.  Musculoskeletal:        General: No swelling.     Cervical back: Neck supple.  Lymphadenopathy:     Cervical: No cervical adenopathy.  Skin:    General: Skin is warm and dry.     Findings: No rash.  Neurological:     General: No focal deficit present.     Mental Status: She is alert.  Psychiatric:        Mood and Affect: Mood normal.        Behavior: Behavior normal.     LABORATORY DATA:  CBC    Component Value Date/Time   WBC 5.9 01/19/2022 0848   WBC 6.8 08/18/2021 0934   RBC 4.03 01/19/2022 0848   HGB 13.4 01/19/2022 0848   HCT 38.9 01/19/2022 0848   PLT 241 01/19/2022 0848   MCV 96.5 01/19/2022 0848   MCH 33.3 01/19/2022 0848   MCHC 34.4 01/19/2022 0848   RDW 13.4 01/19/2022 0848   LYMPHSABS 1.3 01/19/2022 0848   MONOABS 0.5 01/19/2022 0848   EOSABS 0.2  01/19/2022 0848   BASOSABS 0.1 01/19/2022 0848    CMP     Component Value Date/Time   NA 137 01/19/2022 0848   K 3.5 01/19/2022 0848   CL 105 01/19/2022 0848   CO2 26 01/19/2022 0848   GLUCOSE 221 (H) 01/19/2022 0848   BUN 9 01/19/2022 0848   CREATININE  0.71 01/19/2022 0848   CALCIUM 9.6 01/19/2022 0848   PROT 6.6 01/19/2022 0848   ALBUMIN 3.7 01/19/2022 0848   AST 39 01/19/2022 0848   ALT 39 01/19/2022 0848   ALKPHOS 116 01/19/2022 0848   BILITOT 0.4 01/19/2022 0848   GFRNONAA >60 01/19/2022 0848     ASSESSMENT and THERAPY PLAN:   Malignant neoplasm of upper-outer quadrant of right breast in female, estrogen receptor positive ([place]) Sabrina Woods is a 67 year old woman with metastatic breast cancer currently on treatment with Enhertu.  She has no clinical or radiographic signs of progression of her metastatic breast cancer.  We reviewed her restaging scans in detail.  She needs a repeat echocardiogram which I placed orders for today.  I reviewed her labs in detail and on review of her c-Met her glucose appears to have been elevated 200s to 300s for a lot of her past treatments.  Upon further investigation when she saw neurology an A1c was drawn which was 8.7.    I reviewed with her and her husband that in order for her to stay on the Enhertu for as long as it is working we need to ensure her heart functions optimally.  We discussed her high risk for cardiac disease and I placed a referral to our cardio oncologist, Dr. Harl Bowie.  Type 2 diabetes mellitus without complication, without long-term current use of insulin ([place]) I reviewed that she has type 2 diabetes.  Gave her detailed information about this.  We briefly reviewed diet recommendations and I placed orders for metformin XR twice daily.  I recommended healthy diet and exercise and I gave her a handout on both diet and diabetes along with metformin and her after visit summary.  We are rechecking an A1c today to get her baseline upon  treatment initiation.  Chemotherapy-induced peripheral neuropathy ([place]) She continues on gabapentin twice daily and is still experiencing neuropathy.  I reviewed that it is quite possible her elevated sugars over the past few months are contributing to the neuropathy.  She will take the gabapentin 600 mg twice daily and she is considering cutting the tablet in half since it is scored to 300 mg in the middle of the day.  I am hopeful that we may see improvement in her symptoms as her diabetes will begin treatment today.  Essential (primary) hypertension I reviewed with her that her blood pressure is elevated and we are referring to cardiology for further evaluation and management that will occur as a look at her heart risk and bigger picture.  I will not start any medication at this time but will defer to cardiology.  We did discuss sodium limitation today.  All questions were answered. The patient knows to call the clinic with any problems, questions or concerns. We can certainly see the patient much sooner if necessary.  Total encounter time:40 minutes*in face-to-face visit time, chart review, lab review, care coordination, order entry, and documentation of the encounter time.  Wilber Bihari, NP 01/19/22 10:15 AM Medical Oncology and Hematology [facility] [place], [place] [address] Tel. [phone]    Fax. [phone]  *Total Encounter Time as defined by the Centers for Medicare and Medicaid Services includes, in addition to the face-to-face time of a patient visit (documented in the note above) non-face-to-face time: obtaining and reviewing outside history, ordering and reviewing medications, tests or procedures, care coordination (communications with other health care professionals or caregivers) and documentation in the medical record.

## 2022-01-19 NOTE — Patient Instructions (Signed)
[place] ONCOL[facility]  Discharge Instructions: Thank you for choosing [facility] to provide your oncology and hematology care.   If you have a lab appointment with the [facility], please go directly to the [facility] and check in at the registration area.   Wear comfortable clothing and clothing appropriate for easy access to any Portacath or PICC line.   We strive to give you quality time with your provider. You may need to reschedule your appointment if you arrive late (15 or more minutes).  Arriving late affects you and other patients whose appointments are after yours.  Also, if you miss three or more appointments without notifying the office, you may be dismissed from the clinic at the provider's discretion.      For prescription refill requests, have your pharmacy contact our office and allow 72 hours for refills to be completed.    Today you received the following chemotherapy and/or immunotherapy agents: Enhertu      To help prevent nausea and vomiting after your treatment, we encourage you to take your nausea medication as directed.  BELOW ARE SYMPTOMS THAT SHOULD BE REPORTED IMMEDIATELY: *FEVER GREATER THAN 100.4 F (38 C) OR HIGHER *CHILLS OR SWEATING *NAUSEA AND VOMITING THAT IS NOT CONTROLLED WITH YOUR NAUSEA MEDICATION *UNUSUAL SHORTNESS OF BREATH *UNUSUAL BRUISING OR BLEEDING *URINARY PROBLEMS (pain or burning when urinating, or frequent urination) *BOWEL PROBLEMS (unusual diarrhea, constipation, pain near the anus) TENDERNESS IN MOUTH AND THROAT WITH OR WITHOUT PRESENCE OF ULCERS (sore throat, sores in mouth, or a toothache) UNUSUAL RASH, SWELLING OR PAIN  UNUSUAL VAGINAL DISCHARGE OR ITCHING   Items with * indicate a potential emergency and should be followed up as soon as possible or go to the Emergency Department if any problems should occur.  Please show the CHEMOTHERAPY ALERT CARD or IMMUNOTHERAPY ALERT CARD at check-in to  the Emergency Department and triage nurse.  Should you have questions after your visit or need to cancel or reschedule your appointment, please contact [facility]  Dept: [phone]  and follow the prompts.  Office hours are 8:00 a.m. to 4:30 p.m. Monday - Friday. Please note that voicemails left after 4:00 p.m. may not be returned until the following business day.  We are closed weekends and major holidays. You have access to a nurse at all times for urgent questions. Please call the main number to the clinic Dept: [phone] and follow the prompts.   For any non-urgent questions, you may also contact your provider using MyChart. We now offer e-Visits for anyone 32 and older to request care online for non-urgent symptoms. For details visit [email or web address].   Also download the MyChart app! Go to the app store, search "MyChart", open the app, select [facility], and log in with your MyChart username and password.

## 2022-01-24 ENCOUNTER — Ambulatory Visit: Payer: Medicare Other | Attending: Adult Health

## 2022-01-24 VITALS — Wt 199.0 lb

## 2022-01-24 DIAGNOSIS — Z483 Aftercare following surgery for neoplasm: Secondary | ICD-10-CM | POA: Insufficient documentation

## 2022-01-24 NOTE — Therapy (Signed)
OUTPATIENT PHYSICAL THERAPY SOZO SCREENING NOTE   Patient Name: Sabrina Woods MRN: [id number] DOB:06/09/1955, 67 y.o., female Today's Date: 01/24/2022  PCP: [name], MD REFERRING PROVIDER: Wilber Bihari [place] [facility] - 01/24/22 1623     Visit Number 13   # unchanged due to screen only   PT Start Time 9371    PT Stop Time 1626    PT Time Calculation (min) 5 min    Activity Tolerance Patient tolerated treatment well    Behavior During Therapy [facility] for tasks assessed/performed             Past Medical History:  Diagnosis Date   Acid reflux    Anxiety    Breast cancer ([place])    Breast cancer ([place]) 2022   Depression    HISTORY OF   Fibroid    PONV (postoperative nausea and vomiting)    Smoker    Past Surgical History:  Procedure Laterality Date   CESAREAN SECTION     X 2   MASTECTOMY     MASTECTOMY WITH AXILLARY LYMPH NODE DISSECTION Right 08/04/2020   Procedure: RIGHT MASTECTOMY WITH RIGHT AXILLARY LYMPH NODE DISSECTION;  Surgeon: Rolm Bookbinder, MD;  Location: [facility];  Service: General;  Laterality: Right;   PORTACATH PLACEMENT Left 03/17/2020   Procedure: INSERTION PORT-A-CATH;  Surgeon: Rolm Bookbinder, MD;  Location: [facility];  Service: General;  Laterality: Left;   PORTACATH PLACEMENT Left 08/05/2021   Procedure: PORT PLACEMENT WITH ULTRASOUND GUIDANCE;  Surgeon: Rolm Bookbinder, MD;  Location: [facility];  Service: General;  Laterality: Left;  LMA   Patient Active Problem List   Diagnosis Date Noted   Essential (primary) hypertension 01/19/2022   Type 2 diabetes mellitus without complication, without long-term current use of insulin ([place]) 01/19/2022   Fatigue 04/08/2021   S/P mastectomy, right 08/04/2020   Chemotherapy-induced peripheral neuropathy ([place]) 05/20/2020   Malignant neoplasm of upper-outer quadrant of right breast in female, estrogen receptor positive ([place]) 03/10/2020   Fibroid     Smoker    Depression     REFERRING DIAG: right breast cancer at risk for lymphedema  THERAPY DIAG:  Aftercare following surgery for neoplasm  PERTINENT HISTORY: R breast cancer s/p R mastectomy on 08/04/20 with 2 R positive axillary nodes, high grade DCIS. Has completed chemo as of Oct 2022 and radiation 11/04/20  PRECAUTIONS: right UE Lymphedema risk, None  SUBJECTIVE: Pt returns for her 3 month L-Dex screen.   PAIN:  Are you having pain? No  SOZO SCREENING: Patient was assessed today using the SOZO machine to determine the lymphedema index score. This was compared to her baseline score. It was determined that she is within the recommended range when compared to her baseline and no further action is needed at this time. She will continue SOZO screenings. These are done every 3 months for 2 years post operatively followed by every 6 months for 2 years, and then annually.   L-DEX FLOWSHEETS - 01/24/22 1600       L-DEX LYMPHEDEMA SCREENING   Measurement Type Unilateral    L-DEX MEASUREMENT EXTREMITY Upper Extremity    POSITION  Standing    DOMINANT SIDE Right    At Risk Side Right    BASELINE SCORE (UNILATERAL) -3.5    L-DEX SCORE (UNILATERAL) -3.4    VALUE CHANGE (UNILAT) 0.1               Otelia Limes, PTA  01/24/2022, 4:24 PM

## 2022-01-25 ENCOUNTER — Other Ambulatory Visit: Payer: Self-pay

## 2022-01-25 NOTE — Progress Notes (Signed)
Cardiology Office Note:    Date:  01/28/2022   ID:  Sabrina Woods, DOB Oct 19, 1955, MRN [id number]  PCP:  [name], MD   [facility] Providers Cardiologist:  None     Referring MD: [name], MD   No chief complaint on file. Breast cancer on cardiotoxic chemo  History of Present Illness:    Sabrina Woods is a 67 y.o. female with a hx of R breast cancer Stage IIIA ER+ HER2+ diagnosed 03/2020, started pertuzumab 03/2020-10/2020 stopped 2/2 peripheral neuropathy, started trastuzumab 07/2021 -, R mastectomy 08/2020, R XRT therapy fall 2022 referral from Sabrina Woods to assess cardiotoxic risk and mitigate CVD risk.  No cardiac disease history  She denies angina, dyspnea on exertion, lower extremity edema, PND or orthopnea.    CVD Risk: -anti-Her2 therapy since March 22 -HTN- well controlled -HLD- pending lipid panel -DM2- A1c 8.5 - still smoking  Prior cardiotoxic chemo/prior malgnancy:   Prior cardiac dx: -none  Cardiology Studies:  03/16/2020 TTE- EF 65-70%, GLS - 20%, normal RV function, no significant valve disease 06/29/2020 TTE-unchanged 10/08/2020 TTE EF 65-70%, GLS -24 07/22/2021 TTE EF 60-65%, GLS attempted 10/08/2021 TTE EF 60-65%, GLS -19   Past Medical History:  Diagnosis Date   Acid reflux    Anxiety    Breast cancer ([place])    Breast cancer ([place]) 2022   Depression    HISTORY OF   Fibroid    PONV (postoperative nausea and vomiting)    Smoker     Past Surgical History:  Procedure Laterality Date   CESAREAN SECTION     X 2   MASTECTOMY     MASTECTOMY WITH AXILLARY LYMPH NODE DISSECTION Right 08/04/2020   Procedure: RIGHT MASTECTOMY WITH RIGHT AXILLARY LYMPH NODE DISSECTION;  Surgeon: Sabrina Bookbinder, MD;  Location: [facility];  Service: General;  Laterality: Right;   PORTACATH PLACEMENT Left 03/17/2020   Procedure: INSERTION PORT-A-CATH;  Surgeon: Sabrina Bookbinder, MD;  Location: [facility];  Service: General;   Laterality: Left;   PORTACATH PLACEMENT Left 08/05/2021   Procedure: PORT PLACEMENT WITH ULTRASOUND GUIDANCE;  Surgeon: Sabrina Bookbinder, MD;  Location: [facility];  Service: General;  Laterality: Left;  LMA    Current Medications: Current Outpatient Medications on File Prior to Visit  Medication Sig Dispense Refill   ALPRAZolam (XANAX) 0.25 MG tablet Take 1 tablet (0.25 mg total) by mouth 2 (two) times daily as needed for anxiety. 60 tablet 3   blood glucose meter kit and supplies KIT Dispense based on patient and insurance preference. Use up to four times daily as directed. 1 each 0   esomeprazole (NEXIUM) 20 MG capsule Take 20 mg by mouth daily.     gabapentin (NEURONTIN) 600 MG tablet Take 1 tablet (600 mg total) by mouth 3 (three) times daily. 90 tablet 3   lidocaine-prilocaine (EMLA) cream APPLY TO AFFECTED AREA ONCE AS DIRECTED     loratadine (CLARITIN) 10 MG tablet Take 10 mg by mouth daily as needed for rhinitis.     metFORMIN (GLUCOPHAGE-XR) 500 MG 24 hr tablet Take 1 tablet (500 mg total) by mouth 2 (two) times daily with a meal. 60 tablet 1   Multiple Vitamin (MULTIVITAMIN) capsule Take 1 capsule by mouth daily.     nicotine (NICODERM CQ - DOSED IN MG/24 HR) 7 mg/24hr patch PLACE 1 PATCH (7 MG TOTAL) ONTO THE SKIN DAILY.     ondansetron (ZOFRAN) 24 MG tablet Take 24 mg by  mouth once.     prochlorperazine (COMPAZINE) 10 MG tablet Take 1 tablet (10 mg total) by mouth every 6 (six) hours as needed (Nausea or vomiting). 30 tablet 1   UNABLE TO FIND Take by mouth in the morning, at noon, and at bedtime. palmitoylethanolamide luteolin.     No current facility-administered medications on file prior to visit.     Allergies:   Patient has no known allergies.   Social History   Socioeconomic History   Marital status: Married    Spouse name: Not on file   Number of children: Not on file   Years of education: Not on file   Highest education level: Not on file   Occupational History   Not on file  Tobacco Use   Smoking status: Every Day    Packs/day: 0.50    Types: Cigarettes   Smokeless tobacco: Never  Vaping Use   Vaping Use: Never used  Substance and Sexual Activity   Alcohol use: Yes    Comment: occasionally   Drug use: Never   Sexual activity: Yes    Birth control/protection: Post-menopausal  Other Topics Concern   Not on file  Social History Narrative   Right handed    Caffeine- 5-6 cups per day    Social Determinants of Health   Financial Resource Strain: Low Risk  (09/01/2020)   Overall Financial Resource Strain (CARDIA)    Difficulty of Paying Living Expenses: Not hard at all  Food Insecurity: No Food Insecurity (09/01/2020)   Hunger Vital Sign    Worried About Running Out of Food in the Last Year: Never true    [place] in the Last Year: Never true  Transportation Needs: No Transportation Needs (09/01/2020)   PRAPARE - Hydrologist (Medical): No    Lack of Transportation (Non-Medical): No  Physical Activity: Not on file  Stress: Not on file  Social Connections: Not on file     Family History: The patient's family history includes Breast cancer in her mother; Diabetes in her mother; Hypertension in her brother, father, mother, and sister.  ROS:   Please see the history of present illness.     All other systems reviewed and are negative.  EKGs/Labs/Other Studies Reviewed:    The following studies were reviewed today:   EKG:  EKG is  ordered today.  The ekg ordered today demonstrates   01/26/2022- NSR  Recent Labs: 05/24/2021: TSH 1.900 01/19/2022: ALT 39; BUN 9; Creatinine 0.71; Hemoglobin 13.4; Platelet Count 241; Potassium 3.5; Sodium 137 01/26/2022: BNP WILL FOLLOW    Recent Lipid Panel    Component Value Date/Time   CHOL 162 01/26/2022 0951   TRIG 110 01/26/2022 0951   HDL 54 01/26/2022 0951   CHOLHDL 3.0 01/26/2022 0951   CHOLHDL 5.0 03/09/2011 0816   VLDL 27  03/09/2011 0816   LDLCALC 88 01/26/2022 0951     Risk Assessment/Calculations:     Physical Exam:    VS:   Vitals:   01/26/22 0917  BP: 128/80  Pulse: 90  SpO2: 98%      Wt Readings from Last 3 Encounters:  01/26/22 198 lb 3.2 oz (89.9 kg)  01/24/22 199 lb (90.3 kg)  01/19/22 201 lb 1.6 oz (91.2 kg)     GEN:  Well nourished, well developed in no acute distress HEENT: Normal NECK: No JVD; No carotid bruits LYMPHATICS: No lymphadenopathy CARDIAC: RRR, no murmurs, rubs, gallops RESPIRATORY:  Clear to  auscultation without rales, wheezing or rhonchi  ABDOMEN: Soft, non-tender, non-distended MUSCULOSKELETAL:  No edema; No deformity  SKIN: Warm and dry NEUROLOGIC:  Alert and oriented x 3 PSYCHIATRIC:  Normal affect   ASSESSMENT:   R breast CA/Cardio-Onc: on her2 therapy.  Patient of Dr. Lindi Adie and Sabrina Woods. Her GLS has remained normal. Can plan for repeat limited TTE with GLS in 3 months after this upcoming study. If HER2 extended past a year, can plan to further space out her TTE surveillance with normal GLS.  DM2: goal A1c < 7; elevated. Will start farxiga 10 mg daily  HLD: lipid panel today. Start crestor 5 mg daily with DM2 diagnosis.  Smoking cessation- providing a list with her  PLAN:    In order of problems listed above:  Lipid panel, BNP PCP list Limited TTE GLS 04/2022 Farxiga 10 mg daily Crestor 5 mg daily Follow up 6 months           Medication Adjustments/Labs and Tests Ordered: Current medicines are reviewed at length with the patient today.  Concerns regarding medicines are outlined above.  Orders Placed This Encounter  Procedures   Lipid panel   Brain natriuretic peptide   EKG 12-Lead   ECHOCARDIOGRAM LIMITED   No orders of the defined types were placed in this encounter.   Patient Instructions  Medication Instructions:  Farxiga 10 mg daily Crestor 5 mg daily  *If you need a refill on your cardiac medications before your  next appointment, please call your pharmacy*   Lab Work: Your physician recommends that you complete lab work today. BNP, Fasting Lipid   If you have labs (blood work) drawn today and your tests are completely normal, you will receive your results only by: MyChart Message (if you have MyChart) OR A paper copy in the mail If you have any lab test that is abnormal or we need to change your treatment, we will call you to review the results.   Testing/Procedures: Your physician has requested that you have an Limited echocardiogram in April 2024. Echocardiography is a painless test that uses sound waves to create images of your heart. It provides your doctor with information about the size and shape of your heart and how well your heart's chambers and valves are working. This procedure takes approximately one hour. There are no restrictions for this procedure. Please do NOT wear cologne, perfume, aftershave, or lotions (deodorant is allowed). Please arrive 15 minutes prior to your appointment time.    Follow-Up: At [facility], you and your health needs are our priority.  As part of our continuing mission to provide you with exceptional heart care, we have created designated Provider Care Teams.  These Care Teams include your primary Cardiologist (physician) and Advanced Practice Providers (APPs -  Physician Assistants and Nurse Practitioners) who all work together to provide you with the care you need, when you need it.  We recommend signing up for the patient portal called "MyChart".  Sign up information is provided on this After Visit Summary.  MyChart is used to connect with patients for Virtual Visits (Telemedicine).  Patients are able to view lab/test results, encounter notes, upcoming appointments, etc.  Non-urgent messages can be sent to your provider as well.   To learn more about what you can do with MyChart, go to [email or web address].    Your next appointment:   Keep  Follow up   Provider:   Dr. Harl Bowie   Other Instructions    Signed,  Janina Mayo, MD  01/28/2022 10:53 AM    [place]

## 2022-01-26 ENCOUNTER — Ambulatory Visit: Payer: Medicare Other | Attending: Internal Medicine | Admitting: Internal Medicine

## 2022-01-26 ENCOUNTER — Encounter: Payer: Self-pay | Admitting: Internal Medicine

## 2022-01-26 VITALS — BP 128/80 | HR 90 | Ht 66.0 in | Wt 198.2 lb

## 2022-01-26 DIAGNOSIS — R0689 Other abnormalities of breathing: Secondary | ICD-10-CM | POA: Insufficient documentation

## 2022-01-26 DIAGNOSIS — T451X5A Adverse effect of antineoplastic and immunosuppressive drugs, initial encounter: Secondary | ICD-10-CM | POA: Insufficient documentation

## 2022-01-26 DIAGNOSIS — Z79899 Other long term (current) drug therapy: Secondary | ICD-10-CM | POA: Diagnosis not present

## 2022-01-26 DIAGNOSIS — G620 Drug-induced polyneuropathy: Secondary | ICD-10-CM | POA: Insufficient documentation

## 2022-01-26 NOTE — Patient Instructions (Addendum)
Medication Instructions:  Farxiga 10 mg daily Crestor 5 mg daily  *If you need a refill on your cardiac medications before your next appointment, please call your pharmacy*   Lab Work: Your physician recommends that you complete lab work today. BNP, Fasting Lipid   If you have labs (blood work) drawn today and your tests are completely normal, you will receive your results only by: MyChart Message (if you have MyChart) OR A paper copy in the mail If you have any lab test that is abnormal or we need to change your treatment, we will call you to review the results.   Testing/Procedures: Your physician has requested that you have an Limited echocardiogram in April 2024. Echocardiography is a painless test that uses sound waves to create images of your heart. It provides your doctor with information about the size and shape of your heart and how well your heart's chambers and valves are working. This procedure takes approximately one hour. There are no restrictions for this procedure. Please do NOT wear cologne, perfume, aftershave, or lotions (deodorant is allowed). Please arrive 15 minutes prior to your appointment time.    Follow-Up: At [facility], you and your health needs are our priority.  As part of our continuing mission to provide you with exceptional heart care, we have created designated Provider Care Teams.  These Care Teams include your primary Cardiologist (physician) and Advanced Practice Providers (APPs -  Physician Assistants and Nurse Practitioners) who all work together to provide you with the care you need, when you need it.  We recommend signing up for the patient portal called "MyChart".  Sign up information is provided on this After Visit Summary.  MyChart is used to connect with patients for Virtual Visits (Telemedicine).  Patients are able to view lab/test results, encounter notes, upcoming appointments, etc.  Non-urgent messages can be sent to your provider  as well.   To learn more about what you can do with MyChart, go to [email or web address].    Your next appointment:   Keep Follow up   Provider:   Dr. Harl Bowie   Other Instructions

## 2022-01-27 ENCOUNTER — Other Ambulatory Visit: Payer: Self-pay

## 2022-01-28 ENCOUNTER — Ambulatory Visit (HOSPITAL_COMMUNITY)
Admission: RE | Admit: 2022-01-28 | Discharge: 2022-01-28 | Disposition: A | Discharge status: Home / Self Care | Payer: Medicare Other | Source: Ambulatory Visit | Attending: Adult Health | Admitting: Adult Health

## 2022-01-28 DIAGNOSIS — Z170 Estrogen receptor positive status [ER+]: Secondary | ICD-10-CM | POA: Diagnosis not present

## 2022-01-28 DIAGNOSIS — I10 Essential (primary) hypertension: Secondary | ICD-10-CM | POA: Diagnosis not present

## 2022-01-28 DIAGNOSIS — C50411 Malignant neoplasm of upper-outer quadrant of right female breast: Secondary | ICD-10-CM | POA: Insufficient documentation

## 2022-01-28 DIAGNOSIS — Z0189 Encounter for other specified special examinations: Secondary | ICD-10-CM | POA: Diagnosis not present

## 2022-01-28 LAB — ECHOCARDIOGRAM COMPLETE
Area-P 1/2: 4.17 cm2
Calc EF: 66.3 %
S' Lateral: 2.2 cm
Single Plane A2C EF: 62.3 %
Single Plane A4C EF: 72.2 %

## 2022-01-28 LAB — BRAIN NATRIURETIC PEPTIDE: BNP: 6 pg/mL (ref 0.0–100.0)

## 2022-01-28 LAB — LIPID PANEL
Chol/HDL Ratio: 3 ratio (ref 0.0–4.4)
Cholesterol, Total: 162 mg/dL (ref 100–199)
HDL: 54 mg/dL (ref >39)
LDL Chol Calc (NIH): 88 mg/dL (ref 0–99)
Triglycerides: 110 mg/dL (ref 0–149)
VLDL Cholesterol Cal: 20 mg/dL (ref 5–40)

## 2022-01-28 NOTE — Progress Notes (Signed)
Echocardiogram 2D Echocardiogram has been performed.  Fidel Levy 01/28/2022, 9:51 AM

## 2022-01-31 ENCOUNTER — Telehealth: Payer: Self-pay | Admitting: Internal Medicine

## 2022-01-31 MED ORDER — DAPAGLIFLOZIN PROPANEDIOL 10 MG PO TABS: 10.0000 mg | ORAL_TABLET | Freq: Every day | ORAL | 3 refills | Status: DC

## 2022-01-31 MED ORDER — ROSUVASTATIN CALCIUM 5 MG PO TABS: 5.0000 mg | ORAL_TABLET | Freq: Every day | ORAL | 3 refills | Status: DC

## 2022-01-31 NOTE — Telephone Encounter (Signed)
Spoke with pt regarding medications that were not sent in during her office visit. Per Dr. Nelly Laurence office note pt should be on Crestor '5mg'$  and Farxiga '10mg'$  sent to pt's pharmacy of choice. Pt verbalizes understanding.

## 2022-01-31 NOTE — Telephone Encounter (Signed)
Patient calling in bout medication that suppose to be called in for her but is not listed under current medication.

## 2022-02-03 NOTE — Telephone Encounter (Addendum)
Patient needs prior auth for [facility] Glade- will forward to have completed.   KEY: [id number]

## 2022-02-03 NOTE — Telephone Encounter (Signed)
**Note De-Identified Simra Fiebig Obfuscation** Sabrina Woods (Key: [id number]) PA Case ID #: [id number] Rx #: [phone] Outcome: Approved today Coverage Start Date:02/03/2022;Coverage End Date:01/02/2098; Authorization Expiration Date: 01/01/2098 Drug Dapagliflozin Propanediol '10MG'$  tablets ePA cloud logo Engineer, technical sales PA Form [phone] [place])  [place] [id number] [place]Ph: [phone] of this approval.

## 2022-02-08 MED FILL — Dexamethasone Sodium Phosphate Inj 100 MG/10ML: INTRAMUSCULAR | Qty: 1 | Status: AC

## 2022-02-08 MED FILL — Fosaprepitant Dimeglumine For IV Infusion 150 MG (Base Eq): INTRAVENOUS | Qty: 5 | Status: AC

## 2022-02-09 ENCOUNTER — Inpatient Hospital Stay: Payer: Medicare Other | Attending: Hematology and Oncology

## 2022-02-09 ENCOUNTER — Inpatient Hospital Stay: Payer: Medicare Other

## 2022-02-09 ENCOUNTER — Inpatient Hospital Stay (HOSPITAL_BASED_OUTPATIENT_CLINIC_OR_DEPARTMENT_OTHER): Payer: Medicare Other | Admitting: Adult Health

## 2022-02-09 ENCOUNTER — Encounter: Payer: Self-pay | Admitting: Adult Health

## 2022-02-09 VITALS — BP 150/70 | HR 74 | Resp 17

## 2022-02-09 DIAGNOSIS — C50411 Malignant neoplasm of upper-outer quadrant of right female breast: Secondary | ICD-10-CM | POA: Diagnosis not present

## 2022-02-09 DIAGNOSIS — E119 Type 2 diabetes mellitus without complications: Secondary | ICD-10-CM | POA: Diagnosis not present

## 2022-02-09 DIAGNOSIS — R110 Nausea: Secondary | ICD-10-CM | POA: Diagnosis not present

## 2022-02-09 DIAGNOSIS — Z170 Estrogen receptor positive status [ER+]: Secondary | ICD-10-CM | POA: Insufficient documentation

## 2022-02-09 DIAGNOSIS — R911 Solitary pulmonary nodule: Secondary | ICD-10-CM | POA: Insufficient documentation

## 2022-02-09 DIAGNOSIS — I10 Essential (primary) hypertension: Secondary | ICD-10-CM | POA: Insufficient documentation

## 2022-02-09 DIAGNOSIS — Z5112 Encounter for antineoplastic immunotherapy: Secondary | ICD-10-CM | POA: Insufficient documentation

## 2022-02-09 DIAGNOSIS — C773 Secondary and unspecified malignant neoplasm of axilla and upper limb lymph nodes: Secondary | ICD-10-CM | POA: Diagnosis not present

## 2022-02-09 DIAGNOSIS — E032 Hypothyroidism due to medicaments and other exogenous substances: Secondary | ICD-10-CM | POA: Diagnosis not present

## 2022-02-09 DIAGNOSIS — R5383 Other fatigue: Secondary | ICD-10-CM | POA: Diagnosis not present

## 2022-02-09 DIAGNOSIS — G620 Drug-induced polyneuropathy: Secondary | ICD-10-CM | POA: Diagnosis not present

## 2022-02-09 LAB — CMP (CANCER CENTER ONLY)
ALT: 41 U/L (ref 0–44)
AST: 36 U/L (ref 15–41)
Albumin: 3.8 g/dL (ref 3.5–5.0)
Alkaline Phosphatase: 124 U/L (ref 38–126)
Anion gap: 9 (ref 5–15)
BUN: 7 mg/dL — ABNORMAL LOW (ref 8–23)
CO2: 26 mmol/L (ref 22–32)
Calcium: 9.6 mg/dL (ref 8.9–10.3)
Chloride: 104 mmol/L (ref 98–111)
Creatinine: 0.63 mg/dL (ref 0.44–1.00)
GFR, Estimated: >60 mL/min (ref >60)
Glucose, Bld: 204 mg/dL — ABNORMAL HIGH (ref 70–99)
Potassium: 3.6 mmol/L (ref 3.5–5.1)
Sodium: 139 mmol/L (ref 135–145)
Total Bilirubin: 0.4 mg/dL (ref 0.3–1.2)
Total Protein: 6.8 g/dL (ref 6.5–8.1)

## 2022-02-09 LAB — CBC WITH DIFFERENTIAL (CANCER CENTER ONLY)
Abs Immature Granulocytes: 0.02 10*3/uL (ref 0.00–0.07)
Basophils Absolute: 0.1 10*3/uL (ref 0.0–0.1)
Basophils Relative: 1 %
Eosinophils Absolute: 0.1 10*3/uL (ref 0.0–0.5)
Eosinophils Relative: 2 %
HCT: 40.5 % (ref 36.0–46.0)
Hemoglobin: 13.8 g/dL (ref 12.0–15.0)
Immature Granulocytes: 0 %
Lymphocytes Relative: 19 %
Lymphs Abs: 1.2 10*3/uL (ref 0.7–4.0)
MCH: 32.6 pg (ref 26.0–34.0)
MCHC: 34.1 g/dL (ref 30.0–36.0)
MCV: 95.7 fL (ref 80.0–100.0)
Monocytes Absolute: 0.5 10*3/uL (ref 0.1–1.0)
Monocytes Relative: 8 %
Neutro Abs: 4.3 10*3/uL (ref 1.7–7.7)
Neutrophils Relative %: 70 %
Platelet Count: 253 10*3/uL (ref 150–400)
RBC: 4.23 MIL/uL (ref 3.87–5.11)
RDW: 13.7 % (ref 11.5–15.5)
WBC Count: 6.2 10*3/uL (ref 4.0–10.5)
nRBC: 0 % (ref 0.0–0.2)

## 2022-02-09 MED ORDER — SODIUM CHLORIDE 0.9% FLUSH
10.0000 mL | Freq: Once | INTRAVENOUS | Status: AC
  Administered 2022-02-09: 10 mL

## 2022-02-09 MED ORDER — ACETAMINOPHEN 325 MG PO TABS
650.0000 mg | ORAL_TABLET | Freq: Once | ORAL | Status: AC
  Administered 2022-02-09: 650 mg via ORAL
  Filled 2022-02-09: qty 2

## 2022-02-09 MED ORDER — DIPHENHYDRAMINE HCL 25 MG PO CAPS
25.0000 mg | ORAL_CAPSULE | Freq: Once | ORAL | Status: AC
  Administered 2022-02-09: 25 mg via ORAL
  Filled 2022-02-09: qty 1

## 2022-02-09 MED ORDER — SODIUM CHLORIDE 0.9 % IV SOLN
10.0000 mg | Freq: Once | INTRAVENOUS | Status: AC
  Administered 2022-02-09: 10 mg via INTRAVENOUS
  Filled 2022-02-09: qty 10

## 2022-02-09 MED ORDER — PALONOSETRON HCL INJECTION 0.25 MG/5ML
0.2500 mg | Freq: Once | INTRAVENOUS | Status: AC
  Administered 2022-02-09: 0.25 mg via INTRAVENOUS
  Filled 2022-02-09: qty 5

## 2022-02-09 MED ORDER — SODIUM CHLORIDE 0.9% FLUSH
10.0000 mL | INTRAVENOUS | Status: DC | PRN
  Administered 2022-02-09: 10 mL

## 2022-02-09 MED ORDER — CYANOCOBALAMIN 1000 MCG/ML IJ SOLN
1000.0000 ug | Freq: Once | INTRAMUSCULAR | Status: AC
  Administered 2022-02-09: 1000 ug via INTRAMUSCULAR
  Filled 2022-02-09: qty 1

## 2022-02-09 MED ORDER — SODIUM CHLORIDE 0.9 % IV SOLN
150.0000 mg | Freq: Once | INTRAVENOUS | Status: AC
  Administered 2022-02-09: 150 mg via INTRAVENOUS
  Filled 2022-02-09: qty 150

## 2022-02-09 MED ORDER — HEPARIN SOD (PORK) LOCK FLUSH 100 UNIT/ML IV SOLN
500.0000 [IU] | Freq: Once | INTRAVENOUS | Status: AC | PRN
  Administered 2022-02-09: 500 [IU]

## 2022-02-09 MED ORDER — FAM-TRASTUZUMAB DERUXTECAN-NXKI CHEMO 100 MG IV SOLR
4.4000 mg/kg | Freq: Once | INTRAVENOUS | Status: AC
  Administered 2022-02-09: 400 mg via INTRAVENOUS
  Filled 2022-02-09: qty 20

## 2022-02-09 MED ORDER — DEXTROSE 5 % IV SOLN: Freq: Once | INTRAVENOUS | Status: AC

## 2022-02-09 NOTE — Progress Notes (Signed)
[facility] Follow up:    Sabrina Lose, MD [place] [id number]   DIAGNOSIS:  Cancer Staging  Malignant neoplasm of upper-outer quadrant of right breast in female, estrogen receptor positive ([place]) Staging form: Breast, AJCC 8th Edition - Clinical stage from 03/10/2020: Stage IIIA (cT3, cN1, cM0, G3, ER+, PR-, HER2+) - Signed by Sabrina Lose, MD on 03/10/2020 Stage prefix: Initial diagnosis - Pathologic stage from 08/04/2020: No Stage Recommended (ypTis (DCIS), pN2, cM0) - Signed by Gardenia Phlegm, NP on 02/05/2021 Stage prefix: Post-therapy   SUMMARY OF ONCOLOGIC HISTORY: Oncology History  Malignant neoplasm of upper-outer quadrant of right breast in female, estrogen receptor positive ([place])  03/10/2020 Initial Diagnosis   Palpable right breast mass x6 months.  Mammogram revealed left breast cysts (intraductal papilloma), 11 cm right breast mass and 2 masses 2.6 cm each in the right axilla: Biopsy grade 3 IDC ER 5% weak, PR 0%, Ki-67 25%, HER-2 3+ IHC positive   03/10/2020 Cancer Staging   Staging form: Breast, AJCC 8th Edition - Clinical stage from 03/10/2020: Stage IIIA (cT3, cN1, cM0, G3, ER+, PR-, HER2+) - Signed by Sabrina Lose, MD on 03/10/2020 Stage prefix: Initial diagnosis   03/19/2020 - 07/01/2020 Chemotherapy   TCH Perjeta   08/04/2020 Surgery   Right mastectomy (Dr. 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, high-grade DCIS with necrosis and calcifications, and 2 right axillary lymph nodes positive for metastatic carcinoma   08/04/2020 Cancer Staging   Staging form: Breast, AJCC 8th Edition - Pathologic stage from 08/04/2020: No Stage Recommended (ypTis (DCIS), pN2, cM0) - Signed by Gardenia Phlegm, NP on 02/05/2021 Stage prefix: Post-therapy   08/21/2020 - 10/23/2020 Chemotherapy   Discontinued due to peripheral neuropathy Patient is on Treatment Plan : BREAST  ADO-Trastuzumab Emtansine (Kadcyla) q21d      08/2020 -  Anti-estrogen oral therapy    Decided not to do antiestrogen therapy because her ER was 0% on final path.   10/06/2020 - 11/04/2020 Radiation Therapy   Adjuvant radiation    Relapse/Recurrence   Right mastectomy area skin changes: Initially treated for shingles but since lesions did not improve skin biopsy was obtained on 07/14/2021: Infiltrating carcinoma consistent with breast origin CK7 and GA TA 3 positive, ER 0%, PR 0%, HER2 2+ by IHC, FISH negative ratio 1.56, copy #2.1   07/26/2021 -  Chemotherapy   Patient is on Treatment Plan : BREAST METASTATIC Fam-Trastuzumab Deruxtecan-nxki (Enhertu) (5.4) q21d     01/14/2022 Imaging   CT Chest: Slight decrease in size of the right lower lobe pulmonary nodule. Stable tiny non solid nodule in the superior segment of left lower lobe. No new pulmonary nodules. Stable surgical changes from right mastectomy and axillary lymph node dissection. No new developing lymph node enlargement or other mass lesion.  Chest port. Fatty liver infiltration. Aortic Atherosclerosis (ICD10-I70.0).     CURRENT THERAPY:  Enhertu  INTERVAL HISTORY: Sabrina Woods 67 y.o. female returns for follow-up of her metastatic breast cancer on treatment with Enhertu.  She is tolerating treatment well and notes that days 3-7 following treatment she feels tired and mildly nauseated.  Sabrina Woods's most recent restaging occurred on January 14, 2022 demonstrating no sign of cancer progression and a slight decrease in the right lower lobe pulmonary nodule.  She was also recently diagnosed with diabetes and began metformin which she did not tolerate well because of diarrhea.  She is taking 1 tablet  a day and has loose bowel movements.  Also recently seen by Dr. Harl Bowie who prescribed Farxiga.  She did not start taking this because she was uncertain what side effects she could expect.  She also wanted to know if the Crestor was absolutely  necessary for her to take.  Her most recent echocardiogram occurred on January 28, 2022 demonstrating a left ventricular ejection fraction of 55 to 60%.   Patient Active Problem List   Diagnosis Date Noted   Essential (primary) hypertension 01/19/2022   Type 2 diabetes mellitus without complication, without long-term current use of insulin ([place]) 01/19/2022   Fatigue 04/08/2021   S/P mastectomy, right 08/04/2020   Chemotherapy-induced peripheral neuropathy ([place]) 05/20/2020   Malignant neoplasm of upper-outer quadrant of right breast in female, estrogen receptor positive ([place]) 03/10/2020   Fibroid    Smoker    Depression     has No Known Allergies.  MEDICAL HISTORY: Past Medical History:  Diagnosis Date   Acid reflux    Anxiety    Breast cancer ([place])    Breast cancer ([place]) 2022   Depression    HISTORY OF   Fibroid    PONV (postoperative nausea and vomiting)    Smoker     SURGICAL HISTORY: Past Surgical History:  Procedure Laterality Date   CESAREAN SECTION     X 2   MASTECTOMY     MASTECTOMY WITH AXILLARY LYMPH NODE DISSECTION Right 08/04/2020   Procedure: RIGHT MASTECTOMY WITH RIGHT AXILLARY LYMPH NODE DISSECTION;  Surgeon: Rolm Bookbinder, MD;  Location: [facility] OR;  Service: General;  Laterality: Right;   PORTACATH PLACEMENT Left 03/17/2020   Procedure: INSERTION PORT-A-CATH;  Surgeon: Rolm Bookbinder, MD;  Location: [facility];  Service: General;  Laterality: Left;   PORTACATH PLACEMENT Left 08/05/2021   Procedure: PORT PLACEMENT WITH ULTRASOUND GUIDANCE;  Surgeon: Rolm Bookbinder, MD;  Location: [facility];  Service: General;  Laterality: Left;  LMA    SOCIAL HISTORY: Social History   Socioeconomic History   Marital status: Married    Spouse name: Not on file   Number of children: Not on file   Years of education: Not on file   Highest education level: Not on file  Occupational History   Not on file  Tobacco Use   Smoking  status: Every Day    Packs/day: 0.50    Types: Cigarettes   Smokeless tobacco: Never  Vaping Use   Vaping Use: Never used  Substance and Sexual Activity   Alcohol use: Yes    Comment: occasionally   Drug use: Never   Sexual activity: Yes    Birth control/protection: Post-menopausal  Other Topics Concern   Not on file  Social History Narrative   Right handed    Caffeine- 5-6 cups per day    Social Determinants of Health   Financial Resource Strain: Low Risk  (09/01/2020)   Overall Financial Resource Strain (CARDIA)    Difficulty of Paying Living Expenses: Not hard at all  Food Insecurity: No Food Insecurity (09/01/2020)   Hunger Vital Sign    Worried About Running Out of Food in the Last Year: Never true    [place] in the Last Year: Never true  Transportation Needs: No Transportation Needs (09/01/2020)   PRAPARE - Hydrologist (Medical): No    Lack of Transportation (Non-Medical): No  Physical Activity: Not on file  Stress: Not on file  Social Connections: Not on  file  Intimate Partner Violence: Not on file    FAMILY HISTORY: Family History  Problem Relation Age of Onset   Breast cancer Mother    Diabetes Mother    Hypertension Mother    Hypertension Father    Hypertension Sister    Hypertension Brother     Review of Systems  Constitutional:  Positive for fatigue. Negative for appetite change, chills, fever and unexpected weight change.  HENT:   Negative for hearing loss, lump/mass and trouble swallowing.   Eyes:  Negative for eye problems and icterus.  Respiratory:  Negative for chest tightness, cough and shortness of breath.   Cardiovascular:  Negative for chest pain, leg swelling and palpitations.  Gastrointestinal:  Positive for nausea. Negative for abdominal distention, abdominal pain, constipation, diarrhea and vomiting.  Endocrine: Negative for hot flashes.  Genitourinary:  Negative for difficulty urinating.    Musculoskeletal:  Negative for arthralgias.  Skin:  Negative for itching and rash.  Neurological:  Negative for dizziness, extremity weakness, headaches and numbness.  Hematological:  Negative for adenopathy. Does not bruise/bleed easily.  Psychiatric/Behavioral:  Negative for depression. The patient is not nervous/anxious.       PHYSICAL EXAMINATION  ECOG PERFORMANCE STATUS: 1 - Symptomatic but completely ambulatory  Vitals:   02/09/22 0927  BP: (!) 151/73  Pulse: 93  Resp: 16  Temp: 97.8 F (36.6 C)  SpO2: 98%    Physical Exam Constitutional:      General: She is not in acute distress.    Appearance: Normal appearance. She is not toxic-appearing.  HENT:     Head: Normocephalic and atraumatic.  Eyes:     General: No scleral icterus. Cardiovascular:     Rate and Rhythm: Normal rate and regular rhythm.     Pulses: Normal pulses.     Heart sounds: Normal heart sounds.  Pulmonary:     Effort: Pulmonary effort is normal.     Breath sounds: Normal breath sounds.  Abdominal:     General: Abdomen is flat. Bowel sounds are normal. There is no distension.     Palpations: Abdomen is soft.     Tenderness: There is no abdominal tenderness.  Musculoskeletal:        General: No swelling.     Cervical back: Neck supple.  Lymphadenopathy:     Cervical: No cervical adenopathy.  Skin:    General: Skin is warm and dry.     Findings: No rash.  Neurological:     General: No focal deficit present.     Mental Status: She is alert.  Psychiatric:        Mood and Affect: Mood normal.        Behavior: Behavior normal.     LABORATORY DATA:  CBC    Component Value Date/Time   WBC 6.2 02/09/2022 0858   WBC 6.8 08/18/2021 0934   RBC 4.23 02/09/2022 0858   HGB 13.8 02/09/2022 0858   HCT 40.5 02/09/2022 0858   PLT 253 02/09/2022 0858   MCV 95.7 02/09/2022 0858   MCH 32.6 02/09/2022 0858   MCHC 34.1 02/09/2022 0858   RDW 13.7 02/09/2022 0858   LYMPHSABS 1.2 02/09/2022 0858    MONOABS 0.5 02/09/2022 0858   EOSABS 0.1 02/09/2022 0858   BASOSABS 0.1 02/09/2022 0858    CMP     Component Value Date/Time   NA 139 02/09/2022 0858   K 3.6 02/09/2022 0858   CL 104 02/09/2022 0858   CO2 26 02/09/2022 0858  GLUCOSE 204 (H) 02/09/2022 0858   BUN 7 (L) 02/09/2022 0858   CREATININE 0.63 02/09/2022 0858   CALCIUM 9.6 02/09/2022 0858   PROT 6.8 02/09/2022 0858   ALBUMIN 3.8 02/09/2022 0858   AST 36 02/09/2022 0858   ALT 41 02/09/2022 0858   ALKPHOS 124 02/09/2022 0858   BILITOT 0.4 02/09/2022 0858   GFRNONAA >60 02/09/2022 0858        ASSESSMENT and THERAPY PLAN:   Malignant neoplasm of upper-outer quadrant of right breast in female, estrogen receptor positive ([place]) Sabrina Woods is a 67 year old woman with stage IV HER2 positive breast cancer currently being treated with Enhertu.  She is tolerating her treatment well.  Her most recent echocardiogram was stable and her most recent restaging showed no progression of disease.  Her labs today are normal she has no increased symptoms and she will proceed with her treatment today.  We discussed her cardiac risk due to her diabetes, tobacco use, hypertension, and cancer treatment.  I let her know that these factors increase her risk for cardiac disease.  Also since she is a smoker and has lung issues related to this sometimes symptoms of cardiac disease such as shortness of breath can be confused with her pulmonary issues from smoking.  I highly recommended that she take the medications as prescribed by Dr. Harl Bowie and she plans to do so.  She and her husband have began to modify their diet to be more diabetic friendly and plan to continue to do so.  We discussed how this can impact her blood sugar moving forward.  We will see her back in 3 weeks for labs, follow-up, and her next visit.   All questions were answered. The patient knows to call the clinic with any problems, questions or concerns. We can certainly see the patient  much sooner if necessary.  Total encounter time:30 minutes*in face-to-face visit time, chart review, lab review, care coordination, order entry, and documentation of the encounter time.    Wilber Bihari, NP 02/09/22 12:08 PM Medical Oncology and Hematology [facility] [place], [place] [address] Tel. [phone]    Fax. [phone]  *Total Encounter Time as defined by the Centers for Medicare and Medicaid Services includes, in addition to the face-to-face time of a patient visit (documented in the note above) non-face-to-face time: obtaining and reviewing outside history, ordering and reviewing medications, tests or procedures, care coordination (communications with other health care professionals or caregivers) and documentation in the medical record.

## 2022-02-09 NOTE — Assessment & Plan Note (Signed)
Dot is a 68 year old woman with stage IV HER2 positive breast cancer currently being treated with Enhertu.  She is tolerating her treatment well.  Her most recent echocardiogram was stable and her most recent restaging showed no progression of disease.  Her labs today are normal she has no increased symptoms and she will proceed with her treatment today.  We discussed her cardiac risk due to her diabetes, tobacco use, hypertension, and cancer treatment.  I let her know that these factors increase her risk for cardiac disease.  Also since she is a smoker and has lung issues related to this sometimes symptoms of cardiac disease such as shortness of breath can be confused with her pulmonary issues from smoking.  I highly recommended that she take the medications as prescribed by Dr. Harl Bowie and she plans to do so.  She and her husband have began to modify their diet to be more diabetic friendly and plan to continue to do so.  We discussed how this can impact her blood sugar moving forward.  We will see her back in 3 weeks for labs, follow-up, and her next visit.

## 2022-02-10 ENCOUNTER — Other Ambulatory Visit: Payer: Self-pay | Admitting: Adult Health

## 2022-02-10 DIAGNOSIS — E119 Type 2 diabetes mellitus without complications: Secondary | ICD-10-CM

## 2022-02-11 ENCOUNTER — Telehealth: Payer: Self-pay | Admitting: Hematology and Oncology

## 2022-02-11 NOTE — Telephone Encounter (Signed)
Scheduled appointments per WQ. Left voicemail.

## 2022-02-16 ENCOUNTER — Encounter: Payer: Self-pay | Admitting: Hematology and Oncology

## 2022-02-22 ENCOUNTER — Encounter: Payer: Self-pay | Admitting: Hematology and Oncology

## 2022-02-28 NOTE — Progress Notes (Signed)
Patient Care Team: [name], MD as PCP - General (Hematology and Oncology) Rolm Bookbinder, MD as Consulting Physician (General Surgery) Kyung Rudd, MD as Consulting Physician (Radiation Oncology)  DIAGNOSIS: No diagnosis found.  SUMMARY OF ONCOLOGIC HISTORY: Oncology History  Malignant neoplasm of upper-outer quadrant of right breast in female, estrogen receptor positive ([place])  03/10/2020 Initial Diagnosis   Palpable right breast mass x6 months.  Mammogram revealed left breast cysts (intraductal papilloma), 11 cm right breast mass and 2 masses 2.6 cm each in the right axilla: Biopsy grade 3 IDC ER 5% weak, PR 0%, Ki-67 25%, HER-2 3+ IHC positive   03/10/2020 Cancer Staging   Staging form: Breast, AJCC 8th Edition - Clinical stage from 03/10/2020: Stage IIIA (cT3, cN1, cM0, G3, ER+, PR-, HER2+) - Signed by [name], MD on 03/10/2020 Stage prefix: Initial diagnosis   03/19/2020 - 07/01/2020 Chemotherapy   TCH Perjeta   08/04/2020 Surgery   Right mastectomy (Dr. 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, high-grade DCIS with necrosis and calcifications, and 2 right axillary lymph nodes positive for metastatic carcinoma   08/04/2020 Cancer Staging   Staging form: Breast, AJCC 8th Edition - Pathologic stage from 08/04/2020: No Stage Recommended (ypTis (DCIS), pN2, cM0) - Signed by Gardenia Phlegm, NP on 02/05/2021 Stage prefix: Post-therapy   08/21/2020 - 10/23/2020 Chemotherapy   Discontinued due to peripheral neuropathy Patient is on Treatment Plan : BREAST ADO-Trastuzumab Emtansine (Kadcyla) q21d      08/2020 -  Anti-estrogen oral therapy    Decided not to do antiestrogen therapy because her ER was 0% on final path.   10/06/2020 - 11/04/2020 Radiation Therapy   Adjuvant radiation    Relapse/Recurrence   Right mastectomy area skin changes: Initially treated for shingles but since lesions did not improve skin biopsy was  obtained on 07/14/2021: Infiltrating carcinoma consistent with breast origin CK7 and GA TA 3 positive, ER 0%, PR 0%, HER2 2+ by IHC, FISH negative ratio 1.56, copy #2.1   07/26/2021 -  Chemotherapy   Patient is on Treatment Plan : BREAST METASTATIC Fam-Trastuzumab Deruxtecan-nxki (Enhertu) (5.4) q21d     01/14/2022 Imaging   CT Chest: Slight decrease in size of the right lower lobe pulmonary nodule. Stable tiny non solid nodule in the superior segment of left lower lobe. No new pulmonary nodules. Stable surgical changes from right mastectomy and axillary lymph node dissection. No new developing lymph node enlargement or other mass lesion.  Chest port. Fatty liver infiltration. Aortic Atherosclerosis (ICD10-I70.0).     CHIEF COMPLIANT: Follow-up right breast cancer  INTERVAL HISTORY: Sabrina Woods is a  female with above-mentioned history of right breast cancer. She presents to the clinic today for a follow-up.   ALLERGIES:  has No Known Allergies.  MEDICATIONS:  Current Outpatient Medications  Medication Sig Dispense Refill   ALPRAZolam (XANAX) 0.25 MG tablet Take 1 tablet (0.25 mg total) by mouth 2 (two) times daily as needed for anxiety. 60 tablet 3   blood glucose meter kit and supplies KIT Dispense based on patient and insurance preference. Use up to four times daily as directed. 1 each 0   dapagliflozin propanediol (FARXIGA) 10 MG TABS tablet Take 1 tablet (10 mg total) by mouth daily. 90 tablet 3   esomeprazole (NEXIUM) 20 MG capsule Take 20 mg by mouth daily.     gabapentin (NEURONTIN) 600 MG tablet Take 1 tablet (600 mg total) by mouth 3 (three) times daily. [place]  tablet 3   lidocaine-prilocaine (EMLA) cream APPLY TO AFFECTED AREA ONCE AS DIRECTED     loratadine (CLARITIN) 10 MG tablet Take 10 mg by mouth daily as needed for rhinitis.     metFORMIN (GLUCOPHAGE-XR) 500 MG 24 hr tablet TAKE 1 TABLET BY MOUTH 2 TIMES DAILY WITH A MEAL. 180 tablet 1   Multiple Vitamin (MULTIVITAMIN)  capsule Take 1 capsule by mouth daily.     nicotine (NICODERM CQ - DOSED IN MG/24 HR) 7 mg/24hr patch PLACE 1 PATCH (7 MG TOTAL) ONTO THE SKIN DAILY.     ondansetron (ZOFRAN) 24 MG tablet Take 24 mg by mouth once.     prochlorperazine (COMPAZINE) 10 MG tablet Take 1 tablet (10 mg total) by mouth every 6 (six) hours as needed (Nausea or vomiting). 30 tablet 1   rosuvastatin (CRESTOR) 5 MG tablet Take 1 tablet (5 mg total) by mouth daily. 90 tablet 3   UNABLE TO FIND Take by mouth in the morning, at noon, and at bedtime. palmitoylethanolamide luteolin.     No current facility-administered medications for this visit.    PHYSICAL EXAMINATION: ECOG PERFORMANCE STATUS: {CHL ONC ECOG PS:[id number]}  There were no vitals filed for this visit. There were no vitals filed for this visit.  BREAST:*** No palpable masses or nodules in either right or left breasts. No palpable axillary supraclavicular or infraclavicular adenopathy no breast tenderness or nipple discharge. (exam performed in the presence of a chaperone)  LABORATORY DATA:  I have reviewed the data as listed    Latest Ref Rng & Units 02/09/2022    8:58 AM 01/19/2022    8:48 AM 12/29/2021    8:50 AM  CMP  Glucose 70 - 99 mg/dL 204  221  305   BUN 8 - 23 mg/dL '7  9  10   '$ Creatinine 0.44 - 1.00 mg/dL 0.63  0.71  0.71   Sodium 135 - 145 mmol/L 139  137  136   Potassium 3.5 - 5.1 mmol/L 3.6  3.5  3.8   Chloride 98 - 111 mmol/L 104  105  104   CO2 22 - 32 mmol/L '26  26  26   '$ Calcium 8.9 - 10.3 mg/dL 9.6  9.6  9.5   Total Protein 6.5 - 8.1 g/dL 6.8  6.6  6.8   Total Bilirubin 0.3 - 1.2 mg/dL 0.4  0.4  0.3   Alkaline Phos 38 - 126 U/L 124  116  109   AST 15 - 41 U/L 36  39  33   ALT 0 - 44 U/L 41  39  36     Lab Results  Component Value Date   WBC 6.2 02/09/2022   HGB 13.8 02/09/2022   HCT 40.5 02/09/2022   MCV 95.7 02/09/2022   PLT 253 02/09/2022   NEUTROABS 4.3 02/09/2022    ASSESSMENT & PLAN:  No problem-specific Assessment  & Plan notes found for this encounter.    No orders of the defined types were placed in this encounter.  The patient has a good understanding of the overall plan. she agrees with it. she will call with any problems that may develop before the next visit here. Total time spent: 30 mins including face to face time and time spent for planning, charting and co-ordination of care   Suzzette Righter, [place] 02/28/22    I Gardiner Coins am acting as a Education administrator for [employer]  ***

## 2022-03-01 MED FILL — Fosaprepitant Dimeglumine For IV Infusion 150 MG (Base Eq): INTRAVENOUS | Qty: 5 | Status: AC

## 2022-03-01 MED FILL — Dexamethasone Sodium Phosphate Inj 100 MG/10ML: INTRAMUSCULAR | Qty: 1 | Status: AC

## 2022-03-02 ENCOUNTER — Inpatient Hospital Stay: Payer: Medicare Other

## 2022-03-02 ENCOUNTER — Inpatient Hospital Stay (HOSPITAL_BASED_OUTPATIENT_CLINIC_OR_DEPARTMENT_OTHER): Payer: Medicare Other | Admitting: Hematology and Oncology

## 2022-03-02 VITALS — BP 159/83 | HR 93 | Temp 97.7°F | Resp 18 | Ht 66.0 in | Wt 200.7 lb

## 2022-03-02 DIAGNOSIS — R5383 Other fatigue: Secondary | ICD-10-CM

## 2022-03-02 DIAGNOSIS — Z170 Estrogen receptor positive status [ER+]: Secondary | ICD-10-CM | POA: Diagnosis not present

## 2022-03-02 DIAGNOSIS — C50411 Malignant neoplasm of upper-outer quadrant of right female breast: Secondary | ICD-10-CM | POA: Diagnosis not present

## 2022-03-02 DIAGNOSIS — Z5112 Encounter for antineoplastic immunotherapy: Secondary | ICD-10-CM | POA: Diagnosis not present

## 2022-03-02 LAB — CBC WITH DIFFERENTIAL (CANCER CENTER ONLY)
Abs Immature Granulocytes: 0.01 10*3/uL (ref 0.00–0.07)
Basophils Absolute: 0.1 10*3/uL (ref 0.0–0.1)
Basophils Relative: 1 %
Eosinophils Absolute: 0.1 10*3/uL (ref 0.0–0.5)
Eosinophils Relative: 2 %
HCT: 38.9 % (ref 36.0–46.0)
Hemoglobin: 13.2 g/dL (ref 12.0–15.0)
Immature Granulocytes: 0 %
Lymphocytes Relative: 23 %
Lymphs Abs: 1.1 10*3/uL (ref 0.7–4.0)
MCH: 33 pg (ref 26.0–34.0)
MCHC: 33.9 g/dL (ref 30.0–36.0)
MCV: 97.3 fL (ref 80.0–100.0)
Monocytes Absolute: 0.5 10*3/uL (ref 0.1–1.0)
Monocytes Relative: 10 %
Neutro Abs: 3.2 10*3/uL (ref 1.7–7.7)
Neutrophils Relative %: 64 %
Platelet Count: 194 10*3/uL (ref 150–400)
RBC: 4 MIL/uL (ref 3.87–5.11)
RDW: 14.1 % (ref 11.5–15.5)
WBC Count: 5 10*3/uL (ref 4.0–10.5)
nRBC: 0 % (ref 0.0–0.2)

## 2022-03-02 LAB — CMP (CANCER CENTER ONLY)
ALT: 47 U/L — ABNORMAL HIGH (ref 0–44)
AST: 45 U/L — ABNORMAL HIGH (ref 15–41)
Albumin: 3.8 g/dL (ref 3.5–5.0)
Alkaline Phosphatase: 120 U/L (ref 38–126)
Anion gap: 7 (ref 5–15)
BUN: 7 mg/dL — ABNORMAL LOW (ref 8–23)
CO2: 25 mmol/L (ref 22–32)
Calcium: 8.9 mg/dL (ref 8.9–10.3)
Chloride: 107 mmol/L (ref 98–111)
Creatinine: 0.62 mg/dL (ref 0.44–1.00)
GFR, Estimated: >60 mL/min (ref >60)
Glucose, Bld: 191 mg/dL — ABNORMAL HIGH (ref 70–99)
Potassium: 3.6 mmol/L (ref 3.5–5.1)
Sodium: 139 mmol/L (ref 135–145)
Total Bilirubin: 0.5 mg/dL (ref 0.3–1.2)
Total Protein: 6.4 g/dL — ABNORMAL LOW (ref 6.5–8.1)

## 2022-03-02 MED ORDER — HEPARIN SOD (PORK) LOCK FLUSH 100 UNIT/ML IV SOLN: 500.0000 [IU] | Freq: Once | INTRAVENOUS | Status: DC | PRN

## 2022-03-02 MED ORDER — SODIUM CHLORIDE 0.9% FLUSH
10.0000 mL | Freq: Once | INTRAVENOUS | Status: AC
  Administered 2022-03-02: 10 mL

## 2022-03-02 MED ORDER — DEXTROSE 5 % IV SOLN: Freq: Once | INTRAVENOUS | Status: AC

## 2022-03-02 MED ORDER — SODIUM CHLORIDE 0.9% FLUSH: 10.0000 mL | INTRAVENOUS | Status: DC | PRN

## 2022-03-02 MED ORDER — CYANOCOBALAMIN 1000 MCG/ML IJ SOLN
1000.0000 ug | Freq: Once | INTRAMUSCULAR | Status: AC
  Administered 2022-03-02: 1000 ug via INTRAMUSCULAR
  Filled 2022-03-02: qty 1

## 2022-03-02 MED ORDER — PALONOSETRON HCL INJECTION 0.25 MG/5ML
0.2500 mg | Freq: Once | INTRAVENOUS | Status: AC
  Administered 2022-03-02: 0.25 mg via INTRAVENOUS
  Filled 2022-03-02: qty 5

## 2022-03-02 MED ORDER — FAM-TRASTUZUMAB DERUXTECAN-NXKI CHEMO 100 MG IV SOLR
3.2000 mg/kg | Freq: Once | INTRAVENOUS | Status: AC
  Administered 2022-03-02: 300 mg via INTRAVENOUS
  Filled 2022-03-02: qty 15

## 2022-03-02 MED ORDER — SODIUM CHLORIDE 0.9 % IV SOLN
10.0000 mg | Freq: Once | INTRAVENOUS | Status: AC
  Administered 2022-03-02: 10 mg via INTRAVENOUS
  Filled 2022-03-02: qty 10

## 2022-03-02 MED ORDER — DIPHENHYDRAMINE HCL 25 MG PO CAPS
25.0000 mg | ORAL_CAPSULE | Freq: Once | ORAL | Status: AC
  Administered 2022-03-02: 25 mg via ORAL
  Filled 2022-03-02: qty 1

## 2022-03-02 MED ORDER — SODIUM CHLORIDE 0.9 % IV SOLN
150.0000 mg | Freq: Once | INTRAVENOUS | Status: AC
  Administered 2022-03-02: 150 mg via INTRAVENOUS
  Filled 2022-03-02: qty 150

## 2022-03-02 MED ORDER — ACETAMINOPHEN 325 MG PO TABS
650.0000 mg | ORAL_TABLET | Freq: Once | ORAL | Status: AC
  Administered 2022-03-02: 650 mg via ORAL
  Filled 2022-03-02: qty 2

## 2022-03-02 NOTE — Assessment & Plan Note (Addendum)
08/04/20: Right mastectomy (Dr. 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 Neoadjuvant chemotherapy with TCH Perjeta 6 cycles followed by Kadcyla maintenance discontinued 11/11/2020 because of peripheral neuropathy 2. Followed by mastectomy with sentinel lymph node study 08/04/20 3. Followed by adjuvant radiation therapy 09/18/2020- 11/04/2020 4.  Decided not to do antiestrogen therapy because her ER was 0% on final path. 5.  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 Bone scan 08/03/2021: No definite evidence of metastatic disease  Echocardiogram 07/22/2021: EF 60 to 65%    Current treatment: Cycle 11 Enhertu Enhertu toxicities:  1. Fatigue that lasted for 5 days, hair thinning 2. nausea: We will reduce the dosage of Enhertu today. 3.  Insomnia 4.  Worsening neuropathy: Reduced the dosage of Enhertu, stable 5.  Hypothyroidism: We will send a prescription for Synthroid. 6.  Diabetes: We cannot use Decadron because of her diabetes.  She is now on [place].   CT of the chest: 01/14/2022: Slight decrease in the right lower lobe pulmonary nodule.  Stable tiny nodule left lower lobe.  Fatty liver This cutaneous metastases are improving clinically Return to clinic every 3 weeks for Enhertu

## 2022-03-15 NOTE — Progress Notes (Signed)
Patient Care Team: [name], MD as PCP - General (Hematology and Oncology) Rolm Bookbinder, MD as Consulting Physician (General Surgery) Kyung Rudd, MD as Consulting Physician (Radiation Oncology)  DIAGNOSIS: No diagnosis found.  SUMMARY OF ONCOLOGIC HISTORY: Oncology History  Malignant neoplasm of upper-outer quadrant of right breast in female, estrogen receptor positive ([place])  03/10/2020 Initial Diagnosis   Palpable right breast mass x6 months.  Mammogram revealed left breast cysts (intraductal papilloma), 11 cm right breast mass and 2 masses 2.6 cm each in the right axilla: Biopsy grade 3 IDC ER 5% weak, PR 0%, Ki-67 25%, HER-2 3+ IHC positive   03/10/2020 Cancer Staging   Staging form: Breast, AJCC 8th Edition - Clinical stage from 03/10/2020: Stage IIIA (cT3, cN1, cM0, G3, ER+, PR-, HER2+) - Signed by [name], MD on 03/10/2020 Stage prefix: Initial diagnosis   03/19/2020 - 07/01/2020 Chemotherapy   TCH Perjeta   08/04/2020 Surgery   Right mastectomy (Dr. 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, high-grade DCIS with necrosis and calcifications, and 2 right axillary lymph nodes positive for metastatic carcinoma   08/04/2020 Cancer Staging   Staging form: Breast, AJCC 8th Edition - Pathologic stage from 08/04/2020: No Stage Recommended (ypTis (DCIS), pN2, cM0) - Signed by Gardenia Phlegm, NP on 02/05/2021 Stage prefix: Post-therapy   08/21/2020 - 10/23/2020 Chemotherapy   Discontinued due to peripheral neuropathy Patient is on Treatment Plan : BREAST ADO-Trastuzumab Emtansine (Kadcyla) q21d      08/2020 -  Anti-estrogen oral therapy    Decided not to do antiestrogen therapy because her ER was 0% on final path.   10/06/2020 - 11/04/2020 Radiation Therapy   Adjuvant radiation    Relapse/Recurrence   Right mastectomy area skin changes: Initially treated for shingles but since lesions did not improve skin biopsy was  obtained on 07/14/2021: Infiltrating carcinoma consistent with breast origin CK7 and GA TA 3 positive, ER 0%, PR 0%, HER2 2+ by IHC, FISH negative ratio 1.56, copy #2.1   07/26/2021 -  Chemotherapy   Patient is on Treatment Plan : BREAST METASTATIC Fam-Trastuzumab Deruxtecan-nxki (Enhertu) (5.4) q21d     01/14/2022 Imaging   CT Chest: Slight decrease in size of the right lower lobe pulmonary nodule. Stable tiny non solid nodule in the superior segment of left lower lobe. No new pulmonary nodules. Stable surgical changes from right mastectomy and axillary lymph node dissection. No new developing lymph node enlargement or other mass lesion.  Chest port. Fatty liver infiltration. Aortic Atherosclerosis (ICD10-I70.0).     CHIEF COMPLIANT:  Follow-up right breast cancer/ On Enhertu   INTERVAL HISTORY: Sabrina Woods is a  67 yo  female with above-mentioned history of right breast cancer. She presents to the clinic today for a follow-up.    ALLERGIES:  has No Known Allergies.  MEDICATIONS:  Current Outpatient Medications  Medication Sig Dispense Refill   ALPRAZolam (XANAX) 0.25 MG tablet Take 1 tablet (0.25 mg total) by mouth 2 (two) times daily as needed for anxiety. 60 tablet 3   blood glucose meter kit and supplies KIT Dispense based on patient and insurance preference. Use up to four times daily as directed. 1 each 0   dapagliflozin propanediol (FARXIGA) 10 MG TABS tablet Take 1 tablet (10 mg total) by mouth daily. 90 tablet 3   esomeprazole (NEXIUM) 20 MG capsule Take 20 mg by mouth daily.     gabapentin (NEURONTIN) 600 MG tablet Take 1 tablet (600 mg  total) by mouth 3 (three) times daily. 90 tablet 3   lidocaine-prilocaine (EMLA) cream APPLY TO AFFECTED AREA ONCE AS DIRECTED     loratadine (CLARITIN) 10 MG tablet Take 10 mg by mouth daily as needed for rhinitis.     Multiple Vitamin (MULTIVITAMIN) capsule Take 1 capsule by mouth daily.     nicotine (NICODERM CQ - DOSED IN MG/24 HR) 7  mg/24hr patch PLACE 1 PATCH (7 MG TOTAL) ONTO THE SKIN DAILY.     ondansetron (ZOFRAN) 24 MG tablet Take 24 mg by mouth once.     prochlorperazine (COMPAZINE) 10 MG tablet Take 1 tablet (10 mg total) by mouth every 6 (six) hours as needed (Nausea or vomiting). 30 tablet 1   rosuvastatin (CRESTOR) 5 MG tablet Take 1 tablet (5 mg total) by mouth daily. 90 tablet 3   UNABLE TO FIND Take by mouth in the morning, at noon, and at bedtime. palmitoylethanolamide luteolin.     No current facility-administered medications for this visit.    PHYSICAL EXAMINATION: ECOG PERFORMANCE STATUS: {CHL ONC ECOG PS:[phone]}  There were no vitals filed for this visit. There were no vitals filed for this visit.  BREAST:*** No palpable masses or nodules in either right or left breasts. No palpable axillary supraclavicular or infraclavicular adenopathy no breast tenderness or nipple discharge. (exam performed in the presence of a chaperone)  LABORATORY DATA:  I have reviewed the data as listed    Latest Ref Rng & Units 03/02/2022    8:25 AM 02/09/2022    8:58 AM 01/19/2022    8:48 AM  CMP  Glucose 70 - 99 mg/dL 191  204  221   BUN 8 - 23 mg/dL 7  7  9    Creatinine 0.44 - 1.00 mg/dL 0.62  0.63  0.71   Sodium 135 - 145 mmol/L 139  139  137   Potassium 3.5 - 5.1 mmol/L 3.6  3.6  3.5   Chloride 98 - 111 mmol/L 107  104  105   CO2 22 - 32 mmol/L 25  26  26    Calcium 8.9 - 10.3 mg/dL 8.9  9.6  9.6   Total Protein 6.5 - 8.1 g/dL 6.4  6.8  6.6   Total Bilirubin 0.3 - 1.2 mg/dL 0.5  0.4  0.4   Alkaline Phos 38 - 126 U/L 120  124  116   AST 15 - 41 U/L 45  36  39   ALT 0 - 44 U/L 47  41  39     Lab Results  Component Value Date   WBC 5.0 03/02/2022   HGB 13.2 03/02/2022   HCT 38.9 03/02/2022   MCV 97.3 03/02/2022   PLT 194 03/02/2022   NEUTROABS 3.2 03/02/2022    ASSESSMENT & PLAN:  No problem-specific Assessment & Plan notes found for this encounter.    No orders of the defined types were placed  in this encounter.  The patient has a good understanding of the overall plan. she agrees with it. she will call with any problems that may develop before the next visit here. Total time spent: 30 mins including face to face time and time spent for planning, charting and co-ordination of care   Suzzette Righter, [place] 03/15/22    I Gardiner Coins am acting as a Education administrator for [employer]  ***

## 2022-03-23 MED FILL — Fosaprepitant Dimeglumine For IV Infusion 150 MG (Base Eq): INTRAVENOUS | Qty: 5 | Status: AC

## 2022-03-23 MED FILL — Dexamethasone Sodium Phosphate Inj 100 MG/10ML: INTRAMUSCULAR | Qty: 1 | Status: AC

## 2022-03-24 ENCOUNTER — Inpatient Hospital Stay (HOSPITAL_BASED_OUTPATIENT_CLINIC_OR_DEPARTMENT_OTHER): Payer: Medicare Other | Admitting: Hematology and Oncology

## 2022-03-24 ENCOUNTER — Inpatient Hospital Stay: Payer: Medicare Other | Attending: Hematology and Oncology

## 2022-03-24 ENCOUNTER — Inpatient Hospital Stay: Payer: Medicare Other

## 2022-03-24 VITALS — BP 146/64 | HR 88 | Temp 97.2°F | Resp 18 | Ht 66.0 in | Wt 198.0 lb

## 2022-03-24 DIAGNOSIS — Z5112 Encounter for antineoplastic immunotherapy: Secondary | ICD-10-CM | POA: Insufficient documentation

## 2022-03-24 DIAGNOSIS — C773 Secondary and unspecified malignant neoplasm of axilla and upper limb lymph nodes: Secondary | ICD-10-CM | POA: Insufficient documentation

## 2022-03-24 DIAGNOSIS — E032 Hypothyroidism due to medicaments and other exogenous substances: Secondary | ICD-10-CM | POA: Insufficient documentation

## 2022-03-24 DIAGNOSIS — Z170 Estrogen receptor positive status [ER+]: Secondary | ICD-10-CM | POA: Diagnosis not present

## 2022-03-24 DIAGNOSIS — C50411 Malignant neoplasm of upper-outer quadrant of right female breast: Secondary | ICD-10-CM

## 2022-03-24 DIAGNOSIS — R5383 Other fatigue: Secondary | ICD-10-CM

## 2022-03-24 LAB — CBC WITH DIFFERENTIAL (CANCER CENTER ONLY)
Abs Immature Granulocytes: 0.01 10*3/uL (ref 0.00–0.07)
Basophils Absolute: 0.1 10*3/uL (ref 0.0–0.1)
Basophils Relative: 1 %
Eosinophils Absolute: 0.1 10*3/uL (ref 0.0–0.5)
Eosinophils Relative: 3 %
HCT: 40.6 % (ref 36.0–46.0)
Hemoglobin: 13.8 g/dL (ref 12.0–15.0)
Immature Granulocytes: 0 %
Lymphocytes Relative: 22 %
Lymphs Abs: 1.2 10*3/uL (ref 0.7–4.0)
MCH: 32.9 pg (ref 26.0–34.0)
MCHC: 34 g/dL (ref 30.0–36.0)
MCV: 96.7 fL (ref 80.0–100.0)
Monocytes Absolute: 0.5 10*3/uL (ref 0.1–1.0)
Monocytes Relative: 9 %
Neutro Abs: 3.4 10*3/uL (ref 1.7–7.7)
Neutrophils Relative %: 65 %
Platelet Count: 195 10*3/uL (ref 150–400)
RBC: 4.2 MIL/uL (ref 3.87–5.11)
RDW: 14.1 % (ref 11.5–15.5)
WBC Count: 5.2 10*3/uL (ref 4.0–10.5)
nRBC: 0 % (ref 0.0–0.2)

## 2022-03-24 LAB — CMP (CANCER CENTER ONLY)
ALT: 55 U/L — ABNORMAL HIGH (ref 0–44)
AST: 54 U/L — ABNORMAL HIGH (ref 15–41)
Albumin: 3.9 g/dL (ref 3.5–5.0)
Alkaline Phosphatase: 123 U/L (ref 38–126)
Anion gap: 6 (ref 5–15)
BUN: 8 mg/dL (ref 8–23)
CO2: 26 mmol/L (ref 22–32)
Calcium: 9.6 mg/dL (ref 8.9–10.3)
Chloride: 107 mmol/L (ref 98–111)
Creatinine: 0.62 mg/dL (ref 0.44–1.00)
GFR, Estimated: >60 mL/min (ref >60)
Glucose, Bld: 197 mg/dL — ABNORMAL HIGH (ref 70–99)
Potassium: 3.9 mmol/L (ref 3.5–5.1)
Sodium: 139 mmol/L (ref 135–145)
Total Bilirubin: 0.5 mg/dL (ref 0.3–1.2)
Total Protein: 6.9 g/dL (ref 6.5–8.1)

## 2022-03-24 MED ORDER — ACETAMINOPHEN 325 MG PO TABS
650.0000 mg | ORAL_TABLET | Freq: Once | ORAL | Status: AC
  Administered 2022-03-24: 650 mg via ORAL
  Filled 2022-03-24: qty 2

## 2022-03-24 MED ORDER — CYANOCOBALAMIN 1000 MCG/ML IJ SOLN
1000.0000 ug | Freq: Once | INTRAMUSCULAR | Status: AC
  Administered 2022-03-24: 1000 ug via INTRAMUSCULAR
  Filled 2022-03-24: qty 1

## 2022-03-24 MED ORDER — FAM-TRASTUZUMAB DERUXTECAN-NXKI CHEMO 100 MG IV SOLR
3.2000 mg/kg | Freq: Once | INTRAVENOUS | Status: AC
  Administered 2022-03-24: 300 mg via INTRAVENOUS
  Filled 2022-03-24: qty 15

## 2022-03-24 MED ORDER — DIPHENHYDRAMINE HCL 25 MG PO CAPS
25.0000 mg | ORAL_CAPSULE | Freq: Once | ORAL | Status: AC
  Administered 2022-03-24: 25 mg via ORAL
  Filled 2022-03-24: qty 1

## 2022-03-24 MED ORDER — DEXTROSE 5 % IV SOLN: Freq: Once | INTRAVENOUS | Status: AC

## 2022-03-24 MED ORDER — SODIUM CHLORIDE 0.9 % IV SOLN
150.0000 mg | Freq: Once | INTRAVENOUS | Status: AC
  Administered 2022-03-24: 150 mg via INTRAVENOUS
  Filled 2022-03-24: qty 150

## 2022-03-24 MED ORDER — HEPARIN SOD (PORK) LOCK FLUSH 100 UNIT/ML IV SOLN
500.0000 [IU] | Freq: Once | INTRAVENOUS | Status: AC | PRN
  Administered 2022-03-24: 500 [IU]

## 2022-03-24 MED ORDER — PALONOSETRON HCL INJECTION 0.25 MG/5ML
0.2500 mg | Freq: Once | INTRAVENOUS | Status: AC
  Administered 2022-03-24: 0.25 mg via INTRAVENOUS
  Filled 2022-03-24: qty 5

## 2022-03-24 MED ORDER — SODIUM CHLORIDE 0.9% FLUSH
10.0000 mL | INTRAVENOUS | Status: DC | PRN
  Administered 2022-03-24: 10 mL

## 2022-03-24 MED ORDER — SODIUM CHLORIDE 0.9 % IV SOLN
10.0000 mg | Freq: Once | INTRAVENOUS | Status: AC
  Administered 2022-03-24: 10 mg via INTRAVENOUS
  Filled 2022-03-24: qty 10

## 2022-03-24 NOTE — Assessment & Plan Note (Signed)
08/04/20: Right mastectomy (Dr. 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 Neoadjuvant chemotherapy with TCH Perjeta 6 cycles followed by Kadcyla maintenance discontinued 11/11/2020 because of peripheral neuropathy 2. Followed by mastectomy with sentinel lymph node study 08/04/20 3. Followed by adjuvant radiation therapy 09/18/2020- 11/04/2020 4.  Decided not to do antiestrogen therapy because her ER was 0% on final path. 5.  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 Bone scan 08/03/2021: No definite evidence of metastatic disease  Echocardiogram 07/22/2021: EF 60 to 65%    Current treatment: Cycle 12 Enhertu Enhertu toxicities:  1. Fatigue that lasted for 5 days, hair thinning 2. nausea: We will reduce the dosage of Enhertu today. 3.  Insomnia 4.  Worsening neuropathy: Reduced the dosage of Enhertu, stable 5.  Hypothyroidism: We will send a prescription for Synthroid. 6.  Diabetes: We cannot use Decadron because of her diabetes.  She is now on [place].   CT of the chest: 01/14/2022: Slight decrease in the right lower lobe pulmonary nodule.  Stable tiny nodule left lower lobe.  Fatty liver This cutaneous metastases are improving clinically Return to clinic every 3 weeks for Enhertu

## 2022-03-24 NOTE — Patient Instructions (Signed)
[facility]  Discharge Instructions: Thank you for choosing [facility] to provide your oncology and hematology care.   If you have a lab appointment with the [facility], please go directly to the [facility] and check in at the registration area.   Wear comfortable clothing and clothing appropriate for easy access to any Portacath or PICC line.   We strive to give you quality time with your provider. You may need to reschedule your appointment if you arrive late (15 or more minutes).  Arriving late affects you and other patients whose appointments are after yours.  Also, if you miss three or more appointments without notifying the office, you may be dismissed from the clinic at the provider's discretion.      For prescription refill requests, have your pharmacy contact our office and allow 72 hours for refills to be completed.    Today you received the following chemotherapy and/or immunotherapy agents: fam-trastuzumab-deruxtecan-nxki      To help prevent nausea and vomiting after your treatment, we encourage you to take your nausea medication as directed.  BELOW ARE SYMPTOMS THAT SHOULD BE REPORTED IMMEDIATELY: *FEVER GREATER THAN 100.4 F (38 C) OR HIGHER *CHILLS OR SWEATING *NAUSEA AND VOMITING THAT IS NOT CONTROLLED WITH YOUR NAUSEA MEDICATION *UNUSUAL SHORTNESS OF BREATH *UNUSUAL BRUISING OR BLEEDING *URINARY PROBLEMS (pain or burning when urinating, or frequent urination) *BOWEL PROBLEMS (unusual diarrhea, constipation, pain near the anus) TENDERNESS IN MOUTH AND THROAT WITH OR WITHOUT PRESENCE OF ULCERS (sore throat, sores in mouth, or a toothache) UNUSUAL RASH, SWELLING OR PAIN  UNUSUAL VAGINAL DISCHARGE OR ITCHING   Items with * indicate a potential emergency and should be followed up as soon as possible or go to the Emergency Department if any problems should occur.  Please show the CHEMOTHERAPY ALERT CARD or  IMMUNOTHERAPY ALERT CARD at check-in to the Emergency Department and triage nurse.  Should you have questions after your visit or need to cancel or reschedule your appointment, please contact [facility]  Dept: [phone]  and follow the prompts.  Office hours are 8:00 a.m. to 4:30 p.m. Monday - Friday. Please note that voicemails left after 4:00 p.m. may not be returned until the following business day.  We are closed weekends and major holidays. You have access to a nurse at all times for urgent questions. Please call the main number to the clinic Dept: [phone] and follow the prompts.   For any non-urgent questions, you may also contact your provider using MyChart. We now offer e-Visits for anyone 60 and older to request care online for non-urgent symptoms. For details visit [email or web address].   Also download the MyChart app! Go to the app store, search "MyChart", open the app, select [facility], and log in with your MyChart username and password.

## 2022-04-11 ENCOUNTER — Encounter: Payer: Self-pay | Admitting: Hematology and Oncology

## 2022-04-12 ENCOUNTER — Ambulatory Visit (HOSPITAL_COMMUNITY)
Admission: RE | Admit: 2022-04-12 | Discharge: 2022-04-12 | Disposition: A | Discharge status: Home / Self Care | Payer: Medicare Other | Source: Ambulatory Visit | Attending: Hematology and Oncology | Admitting: Hematology and Oncology

## 2022-04-12 DIAGNOSIS — Z170 Estrogen receptor positive status [ER+]: Secondary | ICD-10-CM | POA: Insufficient documentation

## 2022-04-12 DIAGNOSIS — C50411 Malignant neoplasm of upper-outer quadrant of right female breast: Secondary | ICD-10-CM | POA: Diagnosis present

## 2022-04-12 MED ORDER — HEPARIN SOD (PORK) LOCK FLUSH 100 UNIT/ML IV SOLN
500.0000 [IU] | Freq: Once | INTRAVENOUS | Status: AC
  Administered 2022-04-12: 500 [IU] via INTRAVENOUS

## 2022-04-12 MED ORDER — IOHEXOL 300 MG/ML  SOLN
100.0000 mL | Freq: Once | INTRAMUSCULAR | Status: AC | PRN
  Administered 2022-04-12: 100 mL via INTRAVENOUS

## 2022-04-13 MED FILL — Dexamethasone Sodium Phosphate Inj 100 MG/10ML: INTRAMUSCULAR | Qty: 1 | Status: AC

## 2022-04-13 MED FILL — Fosaprepitant Dimeglumine For IV Infusion 150 MG (Base Eq): INTRAVENOUS | Qty: 5 | Status: AC

## 2022-04-13 NOTE — Progress Notes (Addendum)
Patient Care Team: [name], MD as PCP - General (Hematology and Oncology) [name], MD as Consulting Physician (General Surgery) Allisen Puffer, MD as Consulting Physician (Radiation Oncology)  DIAGNOSIS:  Encounter Diagnoses  Name Primary?   Malignant neoplasm of upper-outer quadrant of right breast in female, estrogen receptor positive Yes   Cardiotoxicity     SUMMARY OF ONCOLOGIC HISTORY: Oncology History  Malignant neoplasm of upper-outer quadrant of right breast in female, estrogen receptor positive  03/10/2020 Initial Diagnosis   Palpable right breast mass x6 months.  Mammogram revealed left breast cysts (intraductal papilloma), 11 cm right breast mass and 2 masses 2.6 cm each in the right axilla: Biopsy grade 3 IDC ER 5% weak, PR 0%, Ki-67 25%, HER-2 3+ IHC positive   03/10/2020 Cancer Staging   Staging form: Breast, AJCC 8th Edition - Clinical stage from 03/10/2020: Stage IIIA (cT3, cN1, cM0, G3, ER+, PR-, HER2+) - Signed by [name], MD on 03/10/2020 Stage prefix: Initial diagnosis   03/19/2020 - 07/01/2020 Chemotherapy   TCH Perjeta   08/04/2020 Surgery   Right mastectomy (Dr. 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, high-grade DCIS with necrosis and calcifications, and 2 right axillary lymph nodes positive for metastatic carcinoma   08/04/2020 Cancer Staging   Staging form: Breast, AJCC 8th Edition - Pathologic stage from 08/04/2020: No Stage Recommended (ypTis (DCIS), pN2, cM0) - Signed by Loa Socks, NP on 02/05/2021 Stage prefix: Post-therapy   08/21/2020 - 10/23/2020 Chemotherapy   Discontinued due to peripheral neuropathy Patient is on Treatment Plan : BREAST ADO-Trastuzumab Emtansine (Kadcyla) q21d      08/2020 -  Anti-estrogen oral therapy    Decided not to do antiestrogen therapy because her ER was 0% on final path.   10/06/2020 - 11/04/2020 Radiation Therapy   Adjuvant radiation     Relapse/Recurrence   Right mastectomy area skin changes: Initially treated for shingles but since lesions did not improve skin biopsy was obtained on 07/14/2021: Infiltrating carcinoma consistent with breast origin CK7 and GA TA 3 positive, ER 0%, PR 0%, HER2 2+ by IHC, FISH negative ratio 1.56, copy #2.1   07/26/2021 -  Chemotherapy   Patient is on Treatment Plan : BREAST METASTATIC Fam-Trastuzumab Deruxtecan-nxki (Enhertu) (5.4) q21d     01/14/2022 Imaging   CT Chest: Slight decrease in size of the right lower lobe pulmonary nodule. Stable tiny non solid nodule in the superior segment of left lower lobe. No new pulmonary nodules. Stable surgical changes from right mastectomy and axillary lymph node dissection. No new developing lymph node enlargement or other mass lesion.  Chest port. Fatty liver infiltration. Aortic Atherosclerosis (ICD10-I70.0).     CHIEF COMPLIANT:  Follow-up right breast cancer/ On cycle 13 Enhertu   INTERVAL HISTORY: Sabrina Woods is a  67 yo  female with above-mentioned history of right breast cancer. She presents to the clinic today for a follow-up.She denies any pain in the pelvis area. Denies any bleeding. She does have some mild fatigue, constipation and nausea. She is exhausted she says she is restless for about 2 days.   ALLERGIES:  has No Known Allergies.  MEDICATIONS:  Current Outpatient Medications  Medication Sig Dispense Refill   ACCU-CHEK GUIDE test strip See admin instructions.     ALPRAZolam (XANAX) 0.25 MG tablet Take 1 tablet (0.25 mg total) by mouth 2 (two) times daily as needed for anxiety. 60 tablet 3   blood glucose meter kit and supplies  KIT Dispense based on patient and insurance preference. Use up to four times daily as directed. 1 each 0   dapagliflozin propanediol (FARXIGA) 10 MG TABS tablet Take 1 tablet (10 mg total) by mouth daily. 90 tablet 3   esomeprazole (NEXIUM) 20 MG capsule Take 20 mg by mouth daily.     gabapentin (NEURONTIN) 600  MG tablet Take 1 tablet (600 mg total) by mouth 3 (three) times daily. 90 tablet 3   lidocaine-prilocaine (EMLA) cream APPLY TO AFFECTED AREA ONCE AS DIRECTED     loratadine (CLARITIN) 10 MG tablet Take 10 mg by mouth daily as needed for rhinitis.     Multiple Vitamin (MULTIVITAMIN) capsule Take 1 capsule by mouth daily.     nicotine (NICODERM CQ - DOSED IN MG/24 HR) 7 mg/24hr patch PLACE 1 PATCH (7 MG TOTAL) ONTO THE SKIN DAILY.     ondansetron (ZOFRAN) 24 MG tablet Take 24 mg by mouth once.     prochlorperazine (COMPAZINE) 10 MG tablet Take 1 tablet (10 mg total) by mouth every 6 (six) hours as needed (Nausea or vomiting). 30 tablet 1   Semaglutide,0.25 or 0.5MG /DOS, (OZEMPIC, 0.25 OR 0.5 MG/DOSE,) 2 MG/1.5ML SOPN Inject 0.25 mg into the skin once a week. 4 mL 0   UNABLE TO FIND Take by mouth in the morning, at noon, and at bedtime. palmitoylethanolamide luteolin.     No current facility-administered medications for this visit.   Facility-Administered Medications Ordered in Other Visits  Medication Dose Route Frequency Provider Last Rate Last Admin   acetaminophen (TYLENOL) tablet 650 mg  650 mg Oral Once [name], MD       cyanocobalamin (VITAMIN B12) injection 1,000 mcg  1,000 mcg Intramuscular Once Causey, Larna Daughters, NP       dexamethasone (DECADRON) 10 mg in sodium chloride 0.9 % 50 mL IVPB  10 mg Intravenous Once [name], MD       dextrose 5 % solution   Intravenous Once [name], MD       diphenhydrAMINE (BENADRYL) capsule 25 mg  25 mg Oral Once [name], MD       fam-trastuzumab deruxtecan-nxki (ENHERTU) 300 mg in dextrose 5 % 100 mL chemo infusion  3.2 mg/kg (Treatment Plan Recorded) Intravenous Once [name], MD       fosaprepitant (EMEND) 150 mg in sodium chloride 0.9 % 145 mL IVPB  150 mg Intravenous Once [name], MD       palonosetron (ALOXI) injection 0.25 mg  0.25 mg Intravenous Once [name], MD        PHYSICAL EXAMINATION: ECOG  PERFORMANCE STATUS: 1 - Symptomatic but completely ambulatory  Vitals:   04/14/22 0901  BP: (!) 143/69  Pulse: 88  Resp: 18  Temp: (!) 97.3 F (36.3 C)  SpO2: 97%   Filed Weights   04/14/22 0901  Weight: 198 lb 6.4 oz (90 kg)      LABORATORY DATA:  I have reviewed the data as listed    Latest Ref Rng & Units 04/14/2022    8:43 AM 03/24/2022    8:50 AM 03/02/2022    8:25 AM  CMP  Glucose 70 - 99 mg/dL 161  096  045   BUN 8 - 23 mg/dL 9  8  7    Creatinine 0.44 - 1.00 mg/dL 4.09  8.11  9.14   Sodium 135 - 145 mmol/L 138  139  139   Potassium 3.5 - 5.1 mmol/L 3.7  3.9  3.6  Chloride 98 - 111 mmol/L 106  107  107   CO2 22 - 32 mmol/L 25  26  25    Calcium 8.9 - 10.3 mg/dL 9.5  9.6  8.9   Total Protein 6.5 - 8.1 g/dL 7.0  6.9  6.4   Total Bilirubin 0.3 - 1.2 mg/dL 0.5  0.5  0.5   Alkaline Phos 38 - 126 U/L 126  123  120   AST 15 - 41 U/L 40  54  45   ALT 0 - 44 U/L 40  55  47     Lab Results  Component Value Date   WBC 5.1 04/14/2022   HGB 13.5 04/14/2022   HCT 40.3 04/14/2022   MCV 98.1 04/14/2022   PLT 191 04/14/2022   NEUTROABS 3.2 04/14/2022    ASSESSMENT & PLAN:  Malignant neoplasm of upper-outer quadrant of right breast in female, estrogen receptor positive ([facility]) 08/04/20: Right mastectomy (Dr. 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 Neoadjuvant chemotherapy with TCH Perjeta 6 cycles followed by Kadcyla maintenance discontinued 11/11/2020 because of peripheral neuropathy 2. Followed by mastectomy with sentinel lymph node study 08/04/20 3. Followed by adjuvant radiation therapy 09/18/2020- 11/04/2020 4.  Decided not to do antiestrogen therapy because her ER was 0% on final path. 5.  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  Current treatment:  Enhertu ----------------------------------------------------------------------------------------------------------------- Right mastectomy area skin changes: Initially treated for shingles but since lesions did not improve skin biopsy was obtained on 07/14/2021: Infiltrating carcinoma consistent with breast origin CK7 and GA TA 3 positive, ER 0%, PR 0%, HER2 2+ by IHC, FISH negative ratio 1.56, copy #2.1   CT CAP 08/03/2021: Solid 0.8 cm right lower lobe lung nodule. Bone scan 08/03/2021: No definite evidence of metastatic disease  Echocardiogram 07/22/2021: EF 60 to 65%    Current treatment: Cycle 13 Enhertu Enhertu toxicities:  1. Fatigue that lasted for 5 days, hair thinning 2. nausea: We will reduce the dosage of Enhertu today. 3.  Insomnia 4.  Worsening neuropathy: Reduced the dosage of Enhertu, stable 5.  Diabetes She is going to walk for exercise.  She has tried diet and exercise but has not been able to lose weight.   CT CAP 04/13/2022: Similar tiny solid and nonsolid pulmonary nodules, no acute process of metastatic disease in the abdomen or pelvis, hepatic steatosis, pelvic congestion syndrome  This cutaneous metastases are improving clinically and remained to be in great state. Return to clinic every 3 weeks for Enhertu     Orders Placed This Encounter  Procedures   ECHOCARDIOGRAM COMPLETE    Standing Status:   Future    Standing Expiration Date:   04/14/2023    Order Specific Question:   Where should this test be performed    Answer:   [facility]    Order Specific Question:   Perflutren DEFINITY (image enhancing agent) should be administered unless hypersensitivity or allergy exist    Answer:   Administer Perflutren    Order Specific Question:   Reason for exam-Echo    Answer:   Chemo  [id number]   The patient has a good understanding of the overall plan. she agrees with it. she will call with any problems that may develop before the next visit here. Total time spent: 30 mins  including face to face time and time spent for planning,  charting and co-ordination of care   Tamsen Meek, MD 04/14/22    I Janan Ridge am acting as a Neurosurgeon for [employer]  I have reviewed the above documentation for accuracy and completeness, and I agree with the above.

## 2022-04-14 ENCOUNTER — Inpatient Hospital Stay: Payer: Medicare Other | Attending: Hematology and Oncology

## 2022-04-14 ENCOUNTER — Inpatient Hospital Stay: Payer: Medicare Other | Admitting: Hematology and Oncology

## 2022-04-14 ENCOUNTER — Telehealth: Payer: Self-pay

## 2022-04-14 ENCOUNTER — Inpatient Hospital Stay: Payer: Medicare Other

## 2022-04-14 VITALS — BP 143/69 | HR 88 | Temp 97.3°F | Resp 18 | Ht 66.0 in | Wt 198.4 lb

## 2022-04-14 DIAGNOSIS — Z5112 Encounter for antineoplastic immunotherapy: Secondary | ICD-10-CM | POA: Diagnosis present

## 2022-04-14 DIAGNOSIS — E032 Hypothyroidism due to medicaments and other exogenous substances: Secondary | ICD-10-CM | POA: Insufficient documentation

## 2022-04-14 DIAGNOSIS — Z170 Estrogen receptor positive status [ER+]: Secondary | ICD-10-CM | POA: Diagnosis not present

## 2022-04-14 DIAGNOSIS — R5383 Other fatigue: Secondary | ICD-10-CM | POA: Diagnosis not present

## 2022-04-14 DIAGNOSIS — C50411 Malignant neoplasm of upper-outer quadrant of right female breast: Secondary | ICD-10-CM

## 2022-04-14 DIAGNOSIS — C773 Secondary and unspecified malignant neoplasm of axilla and upper limb lymph nodes: Secondary | ICD-10-CM | POA: Diagnosis not present

## 2022-04-14 DIAGNOSIS — G620 Drug-induced polyneuropathy: Secondary | ICD-10-CM | POA: Insufficient documentation

## 2022-04-14 DIAGNOSIS — K5900 Constipation, unspecified: Secondary | ICD-10-CM | POA: Insufficient documentation

## 2022-04-14 DIAGNOSIS — R110 Nausea: Secondary | ICD-10-CM | POA: Diagnosis not present

## 2022-04-14 DIAGNOSIS — E119 Type 2 diabetes mellitus without complications: Secondary | ICD-10-CM | POA: Diagnosis not present

## 2022-04-14 DIAGNOSIS — R911 Solitary pulmonary nodule: Secondary | ICD-10-CM | POA: Insufficient documentation

## 2022-04-14 DIAGNOSIS — I427 Cardiomyopathy due to drug and external agent: Secondary | ICD-10-CM | POA: Diagnosis not present

## 2022-04-14 LAB — CMP (CANCER CENTER ONLY)
ALT: 40 U/L (ref 0–44)
AST: 40 U/L (ref 15–41)
Albumin: 3.8 g/dL (ref 3.5–5.0)
Alkaline Phosphatase: 126 U/L (ref 38–126)
Anion gap: 7 (ref 5–15)
BUN: 9 mg/dL (ref 8–23)
CO2: 25 mmol/L (ref 22–32)
Calcium: 9.5 mg/dL (ref 8.9–10.3)
Chloride: 106 mmol/L (ref 98–111)
Creatinine: 0.63 mg/dL (ref 0.44–1.00)
GFR, Estimated: >60 mL/min (ref >60)
Glucose, Bld: 203 mg/dL — ABNORMAL HIGH (ref 70–99)
Potassium: 3.7 mmol/L (ref 3.5–5.1)
Sodium: 138 mmol/L (ref 135–145)
Total Bilirubin: 0.5 mg/dL (ref 0.3–1.2)
Total Protein: 7 g/dL (ref 6.5–8.1)

## 2022-04-14 LAB — CBC WITH DIFFERENTIAL (CANCER CENTER ONLY)
Abs Immature Granulocytes: 0.02 10*3/uL (ref 0.00–0.07)
Basophils Absolute: 0.1 10*3/uL (ref 0.0–0.1)
Basophils Relative: 1 %
Eosinophils Absolute: 0.1 10*3/uL (ref 0.0–0.5)
Eosinophils Relative: 3 %
HCT: 40.3 % (ref 36.0–46.0)
Hemoglobin: 13.5 g/dL (ref 12.0–15.0)
Immature Granulocytes: 0 %
Lymphocytes Relative: 23 %
Lymphs Abs: 1.2 10*3/uL (ref 0.7–4.0)
MCH: 32.8 pg (ref 26.0–34.0)
MCHC: 33.5 g/dL (ref 30.0–36.0)
MCV: 98.1 fL (ref 80.0–100.0)
Monocytes Absolute: 0.4 10*3/uL (ref 0.1–1.0)
Monocytes Relative: 9 %
Neutro Abs: 3.2 10*3/uL (ref 1.7–7.7)
Neutrophils Relative %: 64 %
Platelet Count: 191 10*3/uL (ref 150–400)
RBC: 4.11 MIL/uL (ref 3.87–5.11)
RDW: 14.1 % (ref 11.5–15.5)
WBC Count: 5.1 10*3/uL (ref 4.0–10.5)
nRBC: 0 % (ref 0.0–0.2)

## 2022-04-14 MED ORDER — SODIUM CHLORIDE 0.9 % IV SOLN
150.0000 mg | Freq: Once | INTRAVENOUS | Status: AC
  Administered 2022-04-14: 150 mg via INTRAVENOUS
  Filled 2022-04-14: qty 150

## 2022-04-14 MED ORDER — FAM-TRASTUZUMAB DERUXTECAN-NXKI CHEMO 100 MG IV SOLR
3.2000 mg/kg | Freq: Once | INTRAVENOUS | Status: AC
  Administered 2022-04-14: 300 mg via INTRAVENOUS
  Filled 2022-04-14: qty 15

## 2022-04-14 MED ORDER — SODIUM CHLORIDE 0.9% FLUSH
10.0000 mL | Freq: Once | INTRAVENOUS | Status: AC
  Administered 2022-04-14: 10 mL

## 2022-04-14 MED ORDER — DIPHENHYDRAMINE HCL 25 MG PO CAPS
25.0000 mg | ORAL_CAPSULE | Freq: Once | ORAL | Status: AC
  Administered 2022-04-14: 25 mg via ORAL
  Filled 2022-04-14: qty 1

## 2022-04-14 MED ORDER — ACETAMINOPHEN 325 MG PO TABS
650.0000 mg | ORAL_TABLET | Freq: Once | ORAL | Status: AC
  Administered 2022-04-14: 650 mg via ORAL
  Filled 2022-04-14: qty 2

## 2022-04-14 MED ORDER — OZEMPIC (0.25 OR 0.5 MG/DOSE) 2 MG/1.5ML ~~LOC~~ SOPN: 0.2500 mg | PEN_INJECTOR | SUBCUTANEOUS | 0 refills | Status: DC

## 2022-04-14 MED ORDER — SODIUM CHLORIDE 0.9 % IV SOLN
10.0000 mg | Freq: Once | INTRAVENOUS | Status: AC
  Administered 2022-04-14: 10 mg via INTRAVENOUS
  Filled 2022-04-14: qty 10

## 2022-04-14 MED ORDER — CYANOCOBALAMIN 1000 MCG/ML IJ SOLN
1000.0000 ug | Freq: Once | INTRAMUSCULAR | Status: AC
  Administered 2022-04-14: 1000 ug via INTRAMUSCULAR

## 2022-04-14 MED ORDER — PALONOSETRON HCL INJECTION 0.25 MG/5ML
0.2500 mg | Freq: Once | INTRAVENOUS | Status: AC
  Administered 2022-04-14: 0.25 mg via INTRAVENOUS
  Filled 2022-04-14: qty 5

## 2022-04-14 MED ORDER — DEXTROSE 5 % IV SOLN: Freq: Once | INTRAVENOUS | Status: AC

## 2022-04-14 NOTE — Assessment & Plan Note (Signed)
08/04/20: Right mastectomy (Dr. 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 Neoadjuvant chemotherapy with TCH Perjeta 6 cycles followed by Kadcyla maintenance discontinued 11/11/2020 because of peripheral neuropathy 2. Followed by mastectomy with sentinel lymph node study 08/04/20 3. Followed by adjuvant radiation therapy 09/18/2020- 11/04/2020 4.  Decided not to do antiestrogen therapy because her ER was 0% on final path. 5.  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 Bone scan 08/03/2021: No definite evidence of metastatic disease  Echocardiogram 07/22/2021: EF 60 to 65%    Current treatment: Cycle 13 Enhertu Enhertu toxicities:  1. Fatigue that lasted for 5 days, hair thinning 2. nausea: We will reduce the dosage of Enhertu today. 3.  Insomnia 4.  Worsening neuropathy: Reduced the dosage of Enhertu, stable 5.  Hypothyroidism: We will send a prescription for Synthroid. 6.  Diabetes: She stopped Marcelline Deist because of dizziness   CT CAP 04/13/2022: Similar tiny solid and nonsolid pulmonary nodules, no acute process of metastatic disease in the abdomen or pelvis, hepatic steatosis, pelvic congestion syndrome  This cutaneous metastases are improving clinically Return to clinic every 3 weeks for Enhertu

## 2022-04-14 NOTE — Telephone Encounter (Signed)
Notified Patient of prior authorization approval for Ozempic Pen. Medication is approved through 04/14/2023. Co-pay for medication is $600. Advised patient of Patient Assistance program and will provide information and assist with application. No other needs or concerns voiced at this time.

## 2022-04-18 ENCOUNTER — Telehealth (HOSPITAL_COMMUNITY): Payer: Self-pay | Admitting: Hematology and Oncology

## 2022-04-18 NOTE — Telephone Encounter (Signed)
Patient called and cancelled the echocardiogram that you ordered and was scheduled  for 04/26/22 and states that the Oncologist was going to schedule?  Order will be removed from the echo WQ. Thank you

## 2022-04-20 ENCOUNTER — Telehealth: Payer: Self-pay

## 2022-04-20 NOTE — Telephone Encounter (Signed)
Returned Pt's call regarding ozempic. Per Marta Lamas, Pt is not eligible for patient assistance as part of medicare part D and co-pay is $600. Pt reports she would be willing to try a pill, but farxiga causes dizziness and metformin causes diarrhea. Will confer with MD for other options.  Also, Pt ordered for ECHO but not scheduled. ECHO required prior to 05/05/22 tx. Pt scheduled for 05/04/22 at [facility]. Pt verbalized understanding.

## 2022-04-20 NOTE — Telephone Encounter (Signed)
Spoke with patient regarding Ozempic and her copayment. Reminded Patient that I had spoke with her last week regarding eligibility for copayment assistance. Patient is not eligible for Patient Assistance Copayment Card through [employer] due to her Medicare Part D coverage. Patient did not call her Part D Company to find out what medication would be covered with a lower copayment. Patient stated that she would be willing to try a pill. She had previously been prescribed Metformin which caused her diarrhea, and Marcelline Deist which caused her dizziness. Patient states that Marcelline Deist just made her feel bad. Provider's Nurse notified that Patient is willing to try Metformin again but not [place].

## 2022-04-21 ENCOUNTER — Other Ambulatory Visit: Payer: Self-pay | Admitting: *Deleted

## 2022-04-21 MED ORDER — GLIPIZIDE 5 MG PO TABS: 5.0000 mg | ORAL_TABLET | Freq: Every day | ORAL | 1 refills | Status: DC

## 2022-04-21 NOTE — Progress Notes (Signed)
Per MD pt to start Glipizide 5 mg p.o daily for tx of type 2 diabetes since pt was not able to tolerate Metformin or afford the $600 co-pay for Ozempic.  MD also recommends pt getting established with PCP for treatment of type 2 DM.  RN attempt x1 to contact pt, no answer.  LVM for pt.

## 2022-04-25 ENCOUNTER — Ambulatory Visit: Payer: Medicare Other | Attending: Adult Health

## 2022-04-25 VITALS — Wt 197.5 lb

## 2022-04-25 DIAGNOSIS — Z483 Aftercare following surgery for neoplasm: Secondary | ICD-10-CM

## 2022-04-25 NOTE — Therapy (Signed)
OUTPATIENT PHYSICAL THERAPY SOZO SCREENING NOTE   Patient Name: Sabrina Woods MRN: [id number] DOB:06/13/1955, 67 y.o., female Today's Date: 04/25/2022  PCP: [name], MD REFERRING PROVIDER: [name], MD   PT End of Session - 04/25/22 1623     Visit Number 13   # unchanged due to screen only   PT Start Time 1621    PT Stop Time 1625    PT Time Calculation (min) 4 min    Activity Tolerance Patient tolerated treatment well    Behavior During Therapy [facility] for tasks assessed/performed             Past Medical History:  Diagnosis Date   Acid reflux    Anxiety    Breast cancer (HCC)    Breast cancer (HCC) 2022   Depression    HISTORY OF   Fibroid    PONV (postoperative nausea and vomiting)    Smoker    Past Surgical History:  Procedure Laterality Date   CESAREAN SECTION     X 2   MASTECTOMY     MASTECTOMY WITH AXILLARY LYMPH NODE DISSECTION Right 08/04/2020   Procedure: RIGHT MASTECTOMY WITH RIGHT AXILLARY LYMPH NODE DISSECTION;  Surgeon: [name], MD;  Location: [facility] OR;  Service: General;  Laterality: Right;   PORTACATH PLACEMENT Left 03/17/2020   Procedure: INSERTION PORT-A-CATH;  Surgeon: [name], MD;  Location: [facility];  Service: General;  Laterality: Left;   PORTACATH PLACEMENT Left 08/05/2021   Procedure: PORT PLACEMENT WITH ULTRASOUND GUIDANCE;  Surgeon: [name], MD;  Location:  SURGERY CENTER;  Service: General;  Laterality: Left;  LMA   Patient Active Problem List   Diagnosis Date Noted   Essential (primary) hypertension 01/19/2022   Type 2 diabetes mellitus without complication, without long-term current use of insulin 01/19/2022   Fatigue 04/08/2021   S/P mastectomy, right 08/04/2020   Chemotherapy-induced peripheral neuropathy 05/20/2020   Malignant neoplasm of upper-outer quadrant of right breast in female, estrogen receptor positive 03/10/2020   Fibroid    Smoker    Depression      REFERRING DIAG: right breast cancer at risk for lymphedema  THERAPY DIAG:  Aftercare following surgery for neoplasm  PERTINENT HISTORY: R breast cancer s/p R mastectomy on 08/04/20 with 2 R positive axillary nodes, high grade DCIS. Has completed chemo as of Oct 2022 and radiation 11/04/20  PRECAUTIONS: right UE Lymphedema risk, None  SUBJECTIVE: Pt returns for her 3 month L-Dex screen.   PAIN:  Are you having pain? No  SOZO SCREENING: Patient was assessed today using the SOZO machine to determine the lymphedema index score. This was compared to her baseline score. It was determined that she is within the recommended range when compared to her baseline and no further action is needed at this time. She will continue SOZO screenings. These are done every 3 months for 2 years post operatively followed by every 6 months for 2 years, and then annually.   L-DEX FLOWSHEETS - 04/25/22 1600       L-DEX LYMPHEDEMA SCREENING   Measurement Type Unilateral    L-DEX MEASUREMENT EXTREMITY Upper Extremity    POSITION  Standing    DOMINANT SIDE Right    At Risk Side Right    BASELINE SCORE (UNILATERAL) -3.5    L-DEX SCORE (UNILATERAL) 1.7    VALUE CHANGE (UNILAT) 5.2               Hermenia Bers, PTA 04/25/2022, 4:24 PM

## 2022-04-26 ENCOUNTER — Other Ambulatory Visit: Payer: Self-pay

## 2022-04-26 ENCOUNTER — Ambulatory Visit (HOSPITAL_COMMUNITY): Payer: Medicare Other

## 2022-04-29 ENCOUNTER — Telehealth: Payer: Self-pay | Admitting: Hematology and Oncology

## 2022-04-29 NOTE — Telephone Encounter (Signed)
Scheduled appointments per WQ. Patient is aware of the made appointments.  

## 2022-05-04 ENCOUNTER — Ambulatory Visit (HOSPITAL_COMMUNITY)
Admission: RE | Admit: 2022-05-04 | Discharge: 2022-05-04 | Disposition: A | Discharge status: Home / Self Care | Payer: Medicare Other | Source: Ambulatory Visit | Attending: Hematology and Oncology | Admitting: Hematology and Oncology

## 2022-05-04 DIAGNOSIS — Z0189 Encounter for other specified special examinations: Secondary | ICD-10-CM | POA: Diagnosis not present

## 2022-05-04 DIAGNOSIS — E119 Type 2 diabetes mellitus without complications: Secondary | ICD-10-CM | POA: Insufficient documentation

## 2022-05-04 DIAGNOSIS — Z01818 Encounter for other preprocedural examination: Secondary | ICD-10-CM | POA: Diagnosis present

## 2022-05-04 DIAGNOSIS — Z170 Estrogen receptor positive status [ER+]: Secondary | ICD-10-CM | POA: Diagnosis not present

## 2022-05-04 DIAGNOSIS — I427 Cardiomyopathy due to drug and external agent: Secondary | ICD-10-CM | POA: Insufficient documentation

## 2022-05-04 DIAGNOSIS — C50411 Malignant neoplasm of upper-outer quadrant of right female breast: Secondary | ICD-10-CM | POA: Diagnosis not present

## 2022-05-04 DIAGNOSIS — I119 Hypertensive heart disease without heart failure: Secondary | ICD-10-CM | POA: Diagnosis not present

## 2022-05-04 LAB — ECHOCARDIOGRAM COMPLETE
Area-P 1/2: 2.94 cm2
S' Lateral: 2.6 cm

## 2022-05-04 MED FILL — Fosaprepitant Dimeglumine For IV Infusion 150 MG (Base Eq): INTRAVENOUS | Qty: 5 | Status: AC

## 2022-05-04 MED FILL — Dexamethasone Sodium Phosphate Inj 100 MG/10ML: INTRAMUSCULAR | Qty: 1 | Status: AC

## 2022-05-04 NOTE — Progress Notes (Signed)
Patient Care Team: [name], MD as PCP - General (Hematology and Oncology) [name], MD as Consulting Physician (General Surgery) Emmer Puffer, MD as Consulting Physician (Radiation Oncology)  DIAGNOSIS: No diagnosis found.  SUMMARY OF ONCOLOGIC HISTORY: Oncology History  Malignant neoplasm of upper-outer quadrant of right breast in female, estrogen receptor positive (HCC)  03/10/2020 Initial Diagnosis   Palpable right breast mass x6 months.  Mammogram revealed left breast cysts (intraductal papilloma), 11 cm right breast mass and 2 masses 2.6 cm each in the right axilla: Biopsy grade 3 IDC ER 5% weak, PR 0%, Ki-67 25%, HER-2 3+ IHC positive   03/10/2020 Cancer Staging   Staging form: Breast, AJCC 8th Edition - Clinical stage from 03/10/2020: Stage IIIA (cT3, cN1, cM0, G3, ER+, PR-, HER2+) - Signed by [name], MD on 03/10/2020 Stage prefix: Initial diagnosis   03/19/2020 - 07/01/2020 Chemotherapy   TCH Perjeta   08/04/2020 Surgery   Right mastectomy (Dr. 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, high-grade DCIS with necrosis and calcifications, and 2 right axillary lymph nodes positive for metastatic carcinoma   08/04/2020 Cancer Staging   Staging form: Breast, AJCC 8th Edition - Pathologic stage from 08/04/2020: No Stage Recommended (ypTis (DCIS), pN2, cM0) - Signed by Loa Socks, NP on 02/05/2021 Stage prefix: Post-therapy   08/21/2020 - 10/23/2020 Chemotherapy   Discontinued due to peripheral neuropathy Patient is on Treatment Plan : BREAST ADO-Trastuzumab Emtansine (Kadcyla) q21d      08/2020 -  Anti-estrogen oral therapy    Decided not to do antiestrogen therapy because her ER was 0% on final path.   10/06/2020 - 11/04/2020 Radiation Therapy   Adjuvant radiation    Relapse/Recurrence   Right mastectomy area skin changes: Initially treated for shingles but since lesions did not improve skin biopsy was  obtained on 07/14/2021: Infiltrating carcinoma consistent with breast origin CK7 and GA TA 3 positive, ER 0%, PR 0%, HER2 2+ by IHC, FISH negative ratio 1.56, copy #2.1   07/26/2021 -  Chemotherapy   Patient is on Treatment Plan : BREAST METASTATIC Fam-Trastuzumab Deruxtecan-nxki (Enhertu) (5.4) q21d     01/14/2022 Imaging   CT Chest: Slight decrease in size of the right lower lobe pulmonary nodule. Stable tiny non solid nodule in the superior segment of left lower lobe. No new pulmonary nodules. Stable surgical changes from right mastectomy and axillary lymph node dissection. No new developing lymph node enlargement or other mass lesion.  Chest port. Fatty liver infiltration. Aortic Atherosclerosis (ICD10-I70.0).     CHIEF COMPLIANT:   INTERVAL HISTORY: Sabrina Woods is a   ALLERGIES:  has No Known Allergies.  MEDICATIONS:  Current Outpatient Medications  Medication Sig Dispense Refill   ACCU-CHEK GUIDE test strip See admin instructions.     ALPRAZolam (XANAX) 0.25 MG tablet Take 1 tablet (0.25 mg total) by mouth 2 (two) times daily as needed for anxiety. 60 tablet 3   blood glucose meter kit and supplies KIT Dispense based on patient and insurance preference. Use up to four times daily as directed. 1 each 0   dapagliflozin propanediol (FARXIGA) 10 MG TABS tablet Take 1 tablet (10 mg total) by mouth daily. 90 tablet 3   esomeprazole (NEXIUM) 20 MG capsule Take 20 mg by mouth daily.     gabapentin (NEURONTIN) 600 MG tablet Take 1 tablet (600 mg total) by mouth 3 (three) times daily. 90 tablet 3   glipiZIDE (GLUCOTROL) 5 MG tablet Take  1 tablet (5 mg total) by mouth daily before breakfast. 30 tablet 1   lidocaine-prilocaine (EMLA) cream APPLY TO AFFECTED AREA ONCE AS DIRECTED     loratadine (CLARITIN) 10 MG tablet Take 10 mg by mouth daily as needed for rhinitis.     Multiple Vitamin (MULTIVITAMIN) capsule Take 1 capsule by mouth daily.     nicotine (NICODERM CQ - DOSED IN MG/24 HR) 7  mg/24hr patch PLACE 1 PATCH (7 MG TOTAL) ONTO THE SKIN DAILY.     ondansetron (ZOFRAN) 24 MG tablet Take 24 mg by mouth once.     prochlorperazine (COMPAZINE) 10 MG tablet Take 1 tablet (10 mg total) by mouth every 6 (six) hours as needed (Nausea or vomiting). 30 tablet 1   Semaglutide,0.25 or 0.5MG /DOS, (OZEMPIC, 0.25 OR 0.5 MG/DOSE,) 2 MG/1.5ML SOPN Inject 0.25 mg into the skin once a week. 4 mL 0   UNABLE TO FIND Take by mouth in the morning, at noon, and at bedtime. palmitoylethanolamide luteolin.     No current facility-administered medications for this visit.    PHYSICAL EXAMINATION: ECOG PERFORMANCE STATUS: {CHL ONC ECOG PS:[id number]}  There were no vitals filed for this visit. There were no vitals filed for this visit.  BREAST:*** No palpable masses or nodules in either right or left breasts. No palpable axillary supraclavicular or infraclavicular adenopathy no breast tenderness or nipple discharge. (exam performed in the presence of a chaperone)  LABORATORY DATA:  I have reviewed the data as listed    Latest Ref Rng & Units 04/14/2022    8:43 AM 03/24/2022    8:50 AM 03/02/2022    8:25 AM  CMP  Glucose 70 - 99 mg/dL 161  096  045   BUN 8 - 23 mg/dL 9  8  7    Creatinine 0.44 - 1.00 mg/dL 4.09  8.11  9.14   Sodium 135 - 145 mmol/L 138  139  139   Potassium 3.5 - 5.1 mmol/L 3.7  3.9  3.6   Chloride 98 - 111 mmol/L 106  107  107   CO2 22 - 32 mmol/L 25  26  25    Calcium 8.9 - 10.3 mg/dL 9.5  9.6  8.9   Total Protein 6.5 - 8.1 g/dL 7.0  6.9  6.4   Total Bilirubin 0.3 - 1.2 mg/dL 0.5  0.5  0.5   Alkaline Phos 38 - 126 U/L 126  123  120   AST 15 - 41 U/L 40  54  45   ALT 0 - 44 U/L 40  55  47     Lab Results  Component Value Date   WBC 5.1 04/14/2022   HGB 13.5 04/14/2022   HCT 40.3 04/14/2022   MCV 98.1 04/14/2022   PLT 191 04/14/2022   NEUTROABS 3.2 04/14/2022    ASSESSMENT & PLAN:  No problem-specific Assessment & Plan notes found for this encounter.    No  orders of the defined types were placed in this encounter.  The patient has a good understanding of the overall plan. she agrees with it. she will call with any problems that may develop before the next visit here. Total time spent: 30 mins including face to face time and time spent for planning, charting and co-ordination of care   Sherlyn Lick, CMA 05/04/22    I Janan Ridge am acting as a Neurosurgeon for [employer]  ***

## 2022-05-05 ENCOUNTER — Inpatient Hospital Stay: Payer: Medicare Other

## 2022-05-05 ENCOUNTER — Inpatient Hospital Stay: Payer: Medicare Other | Attending: Hematology and Oncology | Admitting: Hematology and Oncology

## 2022-05-05 VITALS — BP 149/58 | HR 85 | Temp 97.2°F | Resp 18 | Ht 66.0 in | Wt 197.5 lb

## 2022-05-05 DIAGNOSIS — C792 Secondary malignant neoplasm of skin: Secondary | ICD-10-CM | POA: Diagnosis not present

## 2022-05-05 DIAGNOSIS — C50411 Malignant neoplasm of upper-outer quadrant of right female breast: Secondary | ICD-10-CM | POA: Diagnosis not present

## 2022-05-05 DIAGNOSIS — Z5112 Encounter for antineoplastic immunotherapy: Secondary | ICD-10-CM | POA: Diagnosis present

## 2022-05-05 DIAGNOSIS — G620 Drug-induced polyneuropathy: Secondary | ICD-10-CM | POA: Insufficient documentation

## 2022-05-05 DIAGNOSIS — C773 Secondary and unspecified malignant neoplasm of axilla and upper limb lymph nodes: Secondary | ICD-10-CM | POA: Insufficient documentation

## 2022-05-05 DIAGNOSIS — Z170 Estrogen receptor positive status [ER+]: Secondary | ICD-10-CM | POA: Insufficient documentation

## 2022-05-05 DIAGNOSIS — E119 Type 2 diabetes mellitus without complications: Secondary | ICD-10-CM | POA: Diagnosis not present

## 2022-05-05 DIAGNOSIS — R5383 Other fatigue: Secondary | ICD-10-CM

## 2022-05-05 LAB — CMP (CANCER CENTER ONLY)
ALT: 40 U/L (ref 0–44)
AST: 37 U/L (ref 15–41)
Albumin: 3.8 g/dL (ref 3.5–5.0)
Alkaline Phosphatase: 120 U/L (ref 38–126)
Anion gap: 5 (ref 5–15)
BUN: 9 mg/dL (ref 8–23)
CO2: 27 mmol/L (ref 22–32)
Calcium: 9.4 mg/dL (ref 8.9–10.3)
Chloride: 105 mmol/L (ref 98–111)
Creatinine: 0.6 mg/dL (ref 0.44–1.00)
GFR, Estimated: >60 mL/min (ref >60)
Glucose, Bld: 204 mg/dL — ABNORMAL HIGH (ref 70–99)
Potassium: 3.7 mmol/L (ref 3.5–5.1)
Sodium: 137 mmol/L (ref 135–145)
Total Bilirubin: 0.5 mg/dL (ref 0.3–1.2)
Total Protein: 6.8 g/dL (ref 6.5–8.1)

## 2022-05-05 LAB — CBC WITH DIFFERENTIAL (CANCER CENTER ONLY)
Abs Immature Granulocytes: 0.02 10*3/uL (ref 0.00–0.07)
Basophils Absolute: 0.1 10*3/uL (ref 0.0–0.1)
Basophils Relative: 2 %
Eosinophils Absolute: 0.1 10*3/uL (ref 0.0–0.5)
Eosinophils Relative: 3 %
HCT: 40.7 % (ref 36.0–46.0)
Hemoglobin: 13.7 g/dL (ref 12.0–15.0)
Immature Granulocytes: 0 %
Lymphocytes Relative: 22 %
Lymphs Abs: 1.1 10*3/uL (ref 0.7–4.0)
MCH: 33 pg (ref 26.0–34.0)
MCHC: 33.7 g/dL (ref 30.0–36.0)
MCV: 98.1 fL (ref 80.0–100.0)
Monocytes Absolute: 0.5 10*3/uL (ref 0.1–1.0)
Monocytes Relative: 10 %
Neutro Abs: 3.2 10*3/uL (ref 1.7–7.7)
Neutrophils Relative %: 63 %
Platelet Count: 166 10*3/uL (ref 150–400)
RBC: 4.15 MIL/uL (ref 3.87–5.11)
RDW: 13.7 % (ref 11.5–15.5)
WBC Count: 5 10*3/uL (ref 4.0–10.5)
nRBC: 0 % (ref 0.0–0.2)

## 2022-05-05 MED ORDER — FAM-TRASTUZUMAB DERUXTECAN-NXKI CHEMO 100 MG IV SOLR
3.2000 mg/kg | Freq: Once | INTRAVENOUS | Status: AC
  Administered 2022-05-05: 300 mg via INTRAVENOUS
  Filled 2022-05-05: qty 15

## 2022-05-05 MED ORDER — HEPARIN SOD (PORK) LOCK FLUSH 100 UNIT/ML IV SOLN
500.0000 [IU] | Freq: Once | INTRAVENOUS | Status: AC | PRN
  Administered 2022-05-05: 500 [IU]

## 2022-05-05 MED ORDER — SODIUM CHLORIDE 0.9 % IV SOLN
150.0000 mg | Freq: Once | INTRAVENOUS | Status: AC
  Administered 2022-05-05: 150 mg via INTRAVENOUS
  Filled 2022-05-05: qty 150

## 2022-05-05 MED ORDER — SODIUM CHLORIDE 0.9 % IV SOLN
10.0000 mg | Freq: Once | INTRAVENOUS | Status: AC
  Administered 2022-05-05: 10 mg via INTRAVENOUS
  Filled 2022-05-05: qty 10

## 2022-05-05 MED ORDER — SODIUM CHLORIDE 0.9% FLUSH
10.0000 mL | Freq: Once | INTRAVENOUS | Status: AC
  Administered 2022-05-05: 10 mL

## 2022-05-05 MED ORDER — DIPHENHYDRAMINE HCL 25 MG PO CAPS
25.0000 mg | ORAL_CAPSULE | Freq: Once | ORAL | Status: AC
  Administered 2022-05-05: 25 mg via ORAL
  Filled 2022-05-05: qty 1

## 2022-05-05 MED ORDER — PALONOSETRON HCL INJECTION 0.25 MG/5ML
0.2500 mg | Freq: Once | INTRAVENOUS | Status: AC
  Administered 2022-05-05: 0.25 mg via INTRAVENOUS
  Filled 2022-05-05: qty 5

## 2022-05-05 MED ORDER — DEXTROSE 5 % IV SOLN: Freq: Once | INTRAVENOUS | Status: AC

## 2022-05-05 MED ORDER — ACETAMINOPHEN 325 MG PO TABS
650.0000 mg | ORAL_TABLET | Freq: Once | ORAL | Status: AC
  Administered 2022-05-05: 650 mg via ORAL
  Filled 2022-05-05: qty 2

## 2022-05-05 MED ORDER — SODIUM CHLORIDE 0.9% FLUSH
10.0000 mL | INTRAVENOUS | Status: DC | PRN
  Administered 2022-05-05: 10 mL

## 2022-05-05 NOTE — Patient Instructions (Signed)
[facility]  Discharge Instructions: Thank you for choosing [facility] to provide your oncology and hematology care.   If you have a lab appointment with the Cancer Center, please go directly to the Cancer Center and check in at the registration area.   Wear comfortable clothing and clothing appropriate for easy access to any Portacath or PICC line.   We strive to give you quality time with your provider. You may need to reschedule your appointment if you arrive late (15 or more minutes).  Arriving late affects you and other patients whose appointments are after yours.  Also, if you miss three or more appointments without notifying the office, you may be dismissed from the clinic at the provider's discretion.      For prescription refill requests, have your pharmacy contact our office and allow 72 hours for refills to be completed.    Today you received the following chemotherapy and/or immunotherapy agents: fam-trastuzumab deruxtecan-nxki      To help prevent nausea and vomiting after your treatment, we encourage you to take your nausea medication as directed.  BELOW ARE SYMPTOMS THAT SHOULD BE REPORTED IMMEDIATELY: *FEVER GREATER THAN 100.4 F (38 C) OR HIGHER *CHILLS OR SWEATING *NAUSEA AND VOMITING THAT IS NOT CONTROLLED WITH YOUR NAUSEA MEDICATION *UNUSUAL SHORTNESS OF BREATH *UNUSUAL BRUISING OR BLEEDING *URINARY PROBLEMS (pain or burning when urinating, or frequent urination) *BOWEL PROBLEMS (unusual diarrhea, constipation, pain near the anus) TENDERNESS IN MOUTH AND THROAT WITH OR WITHOUT PRESENCE OF ULCERS (sore throat, sores in mouth, or a toothache) UNUSUAL RASH, SWELLING OR PAIN  UNUSUAL VAGINAL DISCHARGE OR ITCHING   Items with * indicate a potential emergency and should be followed up as soon as possible or go to the Emergency Department if any problems should occur.  Please show the CHEMOTHERAPY ALERT CARD or  IMMUNOTHERAPY ALERT CARD at check-in to the Emergency Department and triage nurse.  Should you have questions after your visit or need to cancel or reschedule your appointment, please contact [facility]  Dept: [phone]  and follow the prompts.  Office hours are 8:00 a.m. to 4:30 p.m. Monday - Friday. Please note that voicemails left after 4:00 p.m. may not be returned until the following business day.  We are closed weekends and major holidays. You have access to a nurse at all times for urgent questions. Please call the main number to the clinic Dept: [phone] and follow the prompts.   For any non-urgent questions, you may also contact your provider using MyChart. We now offer e-Visits for anyone 18 and older to request care online for non-urgent symptoms. For details visit [email or web address].   Also download the MyChart app! Go to the app store, search "MyChart", open the app, select [employer], and log in with your MyChart username and password.   

## 2022-05-05 NOTE — Assessment & Plan Note (Signed)
08/04/20: Right mastectomy (Dr. 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 Neoadjuvant chemotherapy with TCH Perjeta 6 cycles followed by Kadcyla maintenance discontinued 11/11/2020 because of peripheral neuropathy 2. Followed by mastectomy with sentinel lymph node study 08/04/20 3. Followed by adjuvant radiation therapy 09/18/2020- 11/04/2020 4.  Decided not to do antiestrogen therapy because her ER was 0% on final path. 5.  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 Bone scan 08/03/2021: No definite evidence of metastatic disease  Echocardiogram 07/22/2021: EF 60 to 65%    Current treatment: Cycle 14 Enhertu Enhertu toxicities:  1. Fatigue that lasted for 5 days, hair thinning 2. nausea: We will reduce the dosage of Enhertu today. 3.  Insomnia 4.  Worsening neuropathy: Reduced the dosage of Enhertu, stable 5.  Hypothyroidism: On Synthroid. 6.  Diabetes: On Ozempic   CT CAP 04/13/2022: Similar tiny solid and nonsolid pulmonary nodules, no acute process of metastatic disease in the abdomen or pelvis, hepatic steatosis, pelvic congestion syndrome   This cutaneous metastases are improving clinically and remained to be in great state. Return to clinic every 3 weeks for [facility]

## 2022-05-24 NOTE — Progress Notes (Signed)
Patient Care Team: [name], MD as PCP - General (Hematology and Oncology) [name], MD as Consulting Physician (General Surgery) Chemika Puffer, MD as Consulting Physician (Radiation Oncology)  DIAGNOSIS: No diagnosis found.  SUMMARY OF ONCOLOGIC HISTORY: Oncology History  Malignant neoplasm of upper-outer quadrant of right breast in female, estrogen receptor positive (HCC)  03/10/2020 Initial Diagnosis   Palpable right breast mass x6 months.  Mammogram revealed left breast cysts (intraductal papilloma), 11 cm right breast mass and 2 masses 2.6 cm each in the right axilla: Biopsy grade 3 IDC ER 5% weak, PR 0%, Ki-67 25%, HER-2 3+ IHC positive   03/10/2020 Cancer Staging   Staging form: Breast, AJCC 8th Edition - Clinical stage from 03/10/2020: Stage IIIA (cT3, cN1, cM0, G3, ER+, PR-, HER2+) - Signed by [name], MD on 03/10/2020 Stage prefix: Initial diagnosis   03/19/2020 - 07/01/2020 Chemotherapy   TCH Perjeta   08/04/2020 Surgery   Right mastectomy (Dr. 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, high-grade DCIS with necrosis and calcifications, and 2 right axillary lymph nodes positive for metastatic carcinoma   08/04/2020 Cancer Staging   Staging form: Breast, AJCC 8th Edition - Pathologic stage from 08/04/2020: No Stage Recommended (ypTis (DCIS), pN2, cM0) - Signed by Loa Socks, NP on 02/05/2021 Stage prefix: Post-therapy   08/21/2020 - 10/23/2020 Chemotherapy   Discontinued due to peripheral neuropathy Patient is on Treatment Plan : BREAST ADO-Trastuzumab Emtansine (Kadcyla) q21d      08/2020 -  Anti-estrogen oral therapy    Decided not to do antiestrogen therapy because her ER was 0% on final path.   10/06/2020 - 11/04/2020 Radiation Therapy   Adjuvant radiation    Relapse/Recurrence   Right mastectomy area skin changes: Initially treated for shingles but since lesions did not improve skin biopsy was  obtained on 07/14/2021: Infiltrating carcinoma consistent with breast origin CK7 and GA TA 3 positive, ER 0%, PR 0%, HER2 2+ by IHC, FISH negative ratio 1.56, copy #2.1   07/26/2021 -  Chemotherapy   Patient is on Treatment Plan : BREAST METASTATIC Fam-Trastuzumab Deruxtecan-nxki (Enhertu) (5.4) q21d     01/14/2022 Imaging   CT Chest: Slight decrease in size of the right lower lobe pulmonary nodule. Stable tiny non solid nodule in the superior segment of left lower lobe. No new pulmonary nodules. Stable surgical changes from right mastectomy and axillary lymph node dissection. No new developing lymph node enlargement or other mass lesion.  Chest port. Fatty liver infiltration. Aortic Atherosclerosis (ICD10-I70.0).     CHIEF COMPLIANT: Follow-up right breast cancer/ On cycle 15 Enhertu   INTERVAL HISTORY: Sabrina Woods is a 67 yo  female with above-mentioned history of right breast cancer. She presents to the clinic today for a follow-up.     ALLERGIES:  has No Known Allergies.  MEDICATIONS:  Current Outpatient Medications  Medication Sig Dispense Refill   ACCU-CHEK GUIDE test strip See admin instructions.     ALPRAZolam (XANAX) 0.25 MG tablet Take 1 tablet (0.25 mg total) by mouth 2 (two) times daily as needed for anxiety. 60 tablet 3   blood glucose meter kit and supplies KIT Dispense based on patient and insurance preference. Use up to four times daily as directed. 1 each 0   dapagliflozin propanediol (FARXIGA) 10 MG TABS tablet Take 1 tablet (10 mg total) by mouth daily. 90 tablet 3   esomeprazole (NEXIUM) 20 MG capsule Take 20 mg by mouth daily.  gabapentin (NEURONTIN) 600 MG tablet Take 1 tablet (600 mg total) by mouth 3 (three) times daily. 90 tablet 3   glipiZIDE (GLUCOTROL) 5 MG tablet Take 1 tablet (5 mg total) by mouth daily before breakfast. 30 tablet 1   lidocaine-prilocaine (EMLA) cream APPLY TO AFFECTED AREA ONCE AS DIRECTED     loratadine (CLARITIN) 10 MG tablet Take 10  mg by mouth daily as needed for rhinitis.     Multiple Vitamin (MULTIVITAMIN) capsule Take 1 capsule by mouth daily.     nicotine (NICODERM CQ - DOSED IN MG/24 HR) 7 mg/24hr patch PLACE 1 PATCH (7 MG TOTAL) ONTO THE SKIN DAILY.     ondansetron (ZOFRAN) 24 MG tablet Take 24 mg by mouth once.     prochlorperazine (COMPAZINE) 10 MG tablet Take 1 tablet (10 mg total) by mouth every 6 (six) hours as needed (Nausea or vomiting). 30 tablet 1   Semaglutide,0.25 or 0.5MG /DOS, (OZEMPIC, 0.25 OR 0.5 MG/DOSE,) 2 MG/1.5ML SOPN Inject 0.25 mg into the skin once a week. 4 mL 0   UNABLE TO FIND Take by mouth in the morning, at noon, and at bedtime. palmitoylethanolamide luteolin.     No current facility-administered medications for this visit.    PHYSICAL EXAMINATION: ECOG PERFORMANCE STATUS: {CHL ONC ECOG PS:[id number]}  There were no vitals filed for this visit. There were no vitals filed for this visit.  BREAST:*** No palpable masses or nodules in either right or left breasts. No palpable axillary supraclavicular or infraclavicular adenopathy no breast tenderness or nipple discharge. (exam performed in the presence of a chaperone)  LABORATORY DATA:  I have reviewed the data as listed    Latest Ref Rng & Units 05/05/2022    9:02 AM 04/14/2022    8:43 AM 03/24/2022    8:50 AM  CMP  Glucose 70 - 99 mg/dL 010  272  536   BUN 8 - 23 mg/dL 9  9  8    Creatinine 0.44 - 1.00 mg/dL 6.44  0.34  7.42   Sodium 135 - 145 mmol/L 137  138  139   Potassium 3.5 - 5.1 mmol/L 3.7  3.7  3.9   Chloride 98 - 111 mmol/L 105  106  107   CO2 22 - 32 mmol/L 27  25  26    Calcium 8.9 - 10.3 mg/dL 9.4  9.5  9.6   Total Protein 6.5 - 8.1 g/dL 6.8  7.0  6.9   Total Bilirubin 0.3 - 1.2 mg/dL 0.5  0.5  0.5   Alkaline Phos 38 - 126 U/L 120  126  123   AST 15 - 41 U/L 37  40  54   ALT 0 - 44 U/L 40  40  55     Lab Results  Component Value Date   WBC 5.0 05/05/2022   HGB 13.7 05/05/2022   HCT 40.7 05/05/2022   MCV 98.1  05/05/2022   PLT 166 05/05/2022   NEUTROABS 3.2 05/05/2022    ASSESSMENT & PLAN:  No problem-specific Assessment & Plan notes found for this encounter.    No orders of the defined types were placed in this encounter.  The patient has a good understanding of the overall plan. she agrees with it. she will call with any problems that may develop before the next visit here. Total time spent: 30 mins including face to face time and time spent for planning, charting and co-ordination of care   Sherlyn Lick, CMA 05/24/22  I Janan Ridge am acting as a Neurosurgeon for [employer]  ***

## 2022-05-25 MED FILL — Fosaprepitant Dimeglumine For IV Infusion 150 MG (Base Eq): INTRAVENOUS | Qty: 5 | Status: AC

## 2022-05-25 MED FILL — Dexamethasone Sodium Phosphate Inj 100 MG/10ML: INTRAMUSCULAR | Qty: 1 | Status: AC

## 2022-05-26 ENCOUNTER — Encounter: Payer: Self-pay | Admitting: Hematology and Oncology

## 2022-05-26 ENCOUNTER — Inpatient Hospital Stay: Payer: Medicare Other

## 2022-05-26 ENCOUNTER — Inpatient Hospital Stay (HOSPITAL_BASED_OUTPATIENT_CLINIC_OR_DEPARTMENT_OTHER): Payer: Medicare Other | Admitting: Hematology and Oncology

## 2022-05-26 ENCOUNTER — Other Ambulatory Visit (HOSPITAL_COMMUNITY): Payer: Self-pay

## 2022-05-26 VITALS — BP 145/70 | HR 81 | Temp 97.8°F | Resp 18 | Wt 196.9 lb

## 2022-05-26 VITALS — BP 131/64 | HR 70 | Resp 18

## 2022-05-26 DIAGNOSIS — C50411 Malignant neoplasm of upper-outer quadrant of right female breast: Secondary | ICD-10-CM

## 2022-05-26 DIAGNOSIS — R5383 Other fatigue: Secondary | ICD-10-CM

## 2022-05-26 DIAGNOSIS — Z170 Estrogen receptor positive status [ER+]: Secondary | ICD-10-CM

## 2022-05-26 DIAGNOSIS — Z5112 Encounter for antineoplastic immunotherapy: Secondary | ICD-10-CM | POA: Diagnosis not present

## 2022-05-26 LAB — CMP (CANCER CENTER ONLY)
ALT: 35 U/L (ref 0–44)
AST: 34 U/L (ref 15–41)
Albumin: 3.9 g/dL (ref 3.5–5.0)
Alkaline Phosphatase: 126 U/L (ref 38–126)
Anion gap: 7 (ref 5–15)
BUN: 9 mg/dL (ref 8–23)
CO2: 26 mmol/L (ref 22–32)
Calcium: 9.4 mg/dL (ref 8.9–10.3)
Chloride: 106 mmol/L (ref 98–111)
Creatinine: 0.65 mg/dL (ref 0.44–1.00)
GFR, Estimated: >60 mL/min (ref >60)
Glucose, Bld: 201 mg/dL — ABNORMAL HIGH (ref 70–99)
Potassium: 3.9 mmol/L (ref 3.5–5.1)
Sodium: 139 mmol/L (ref 135–145)
Total Bilirubin: 0.4 mg/dL (ref 0.3–1.2)
Total Protein: 6.8 g/dL (ref 6.5–8.1)

## 2022-05-26 LAB — CBC WITH DIFFERENTIAL (CANCER CENTER ONLY)
Abs Immature Granulocytes: 0.01 10*3/uL (ref 0.00–0.07)
Basophils Absolute: 0.1 10*3/uL (ref 0.0–0.1)
Basophils Relative: 1 %
Eosinophils Absolute: 0.2 10*3/uL (ref 0.0–0.5)
Eosinophils Relative: 3 %
HCT: 39.7 % (ref 36.0–46.0)
Hemoglobin: 13.6 g/dL (ref 12.0–15.0)
Immature Granulocytes: 0 %
Lymphocytes Relative: 25 %
Lymphs Abs: 1.3 10*3/uL (ref 0.7–4.0)
MCH: 33.3 pg (ref 26.0–34.0)
MCHC: 34.3 g/dL (ref 30.0–36.0)
MCV: 97.3 fL (ref 80.0–100.0)
Monocytes Absolute: 0.5 10*3/uL (ref 0.1–1.0)
Monocytes Relative: 10 %
Neutro Abs: 3.2 10*3/uL (ref 1.7–7.7)
Neutrophils Relative %: 61 %
Platelet Count: 164 10*3/uL (ref 150–400)
RBC: 4.08 MIL/uL (ref 3.87–5.11)
RDW: 13.6 % (ref 11.5–15.5)
WBC Count: 5.2 10*3/uL (ref 4.0–10.5)
nRBC: 0 % (ref 0.0–0.2)

## 2022-05-26 MED ORDER — SODIUM CHLORIDE 0.9 % IV SOLN
10.0000 mg | Freq: Once | INTRAVENOUS | Status: AC
  Administered 2022-05-26: 10 mg via INTRAVENOUS
  Filled 2022-05-26: qty 10

## 2022-05-26 MED ORDER — DIPHENHYDRAMINE HCL 25 MG PO CAPS
25.0000 mg | ORAL_CAPSULE | Freq: Once | ORAL | Status: AC
  Administered 2022-05-26: 25 mg via ORAL
  Filled 2022-05-26: qty 1

## 2022-05-26 MED ORDER — FAM-TRASTUZUMAB DERUXTECAN-NXKI CHEMO 100 MG IV SOLR
3.2000 mg/kg | Freq: Once | INTRAVENOUS | Status: AC
  Administered 2022-05-26: 300 mg via INTRAVENOUS
  Filled 2022-05-26: qty 15

## 2022-05-26 MED ORDER — SODIUM CHLORIDE 0.9 % IV SOLN
150.0000 mg | Freq: Once | INTRAVENOUS | Status: AC
  Administered 2022-05-26: 150 mg via INTRAVENOUS
  Filled 2022-05-26: qty 150

## 2022-05-26 MED ORDER — SODIUM CHLORIDE 0.9% FLUSH
10.0000 mL | INTRAVENOUS | Status: DC | PRN
  Administered 2022-05-26: 10 mL

## 2022-05-26 MED ORDER — ACETAMINOPHEN 325 MG PO TABS
650.0000 mg | ORAL_TABLET | Freq: Once | ORAL | Status: AC
  Administered 2022-05-26: 650 mg via ORAL
  Filled 2022-05-26: qty 2

## 2022-05-26 MED ORDER — PALONOSETRON HCL INJECTION 0.25 MG/5ML
0.2500 mg | Freq: Once | INTRAVENOUS | Status: AC
  Administered 2022-05-26: 0.25 mg via INTRAVENOUS
  Filled 2022-05-26: qty 5

## 2022-05-26 MED ORDER — HEPARIN SOD (PORK) LOCK FLUSH 100 UNIT/ML IV SOLN
500.0000 [IU] | Freq: Once | INTRAVENOUS | Status: AC | PRN
  Administered 2022-05-26: 500 [IU]

## 2022-05-26 MED ORDER — OZEMPIC (0.25 OR 0.5 MG/DOSE) 2 MG/3ML ~~LOC~~ SOPN
0.2500 mg | PEN_INJECTOR | SUBCUTANEOUS | 0 refills | Status: DC
  Filled 2022-05-26: qty 3, 28d supply, fill #0

## 2022-05-26 MED ORDER — SODIUM CHLORIDE 0.9% FLUSH
10.0000 mL | Freq: Once | INTRAVENOUS | Status: AC
  Administered 2022-05-26: 10 mL

## 2022-05-26 MED ORDER — CYANOCOBALAMIN 1000 MCG/ML IJ SOLN
1000.0000 ug | Freq: Once | INTRAMUSCULAR | Status: AC
  Administered 2022-05-26: 1000 ug via INTRAMUSCULAR
  Filled 2022-05-26: qty 1

## 2022-05-26 MED ORDER — DEXTROSE 5 % IV SOLN: Freq: Once | INTRAVENOUS | Status: AC

## 2022-05-26 NOTE — Patient Instructions (Signed)
[facility]  Discharge Instructions: Thank you for choosing [facility] to provide your oncology and hematology care.   If you have a lab appointment with the Cancer Center, please go directly to the Cancer Center and check in at the registration area.   Wear comfortable clothing and clothing appropriate for easy access to any Portacath or PICC line.   We strive to give you quality time with your provider. You may need to reschedule your appointment if you arrive late (15 or more minutes).  Arriving late affects you and other patients whose appointments are after yours.  Also, if you miss three or more appointments without notifying the office, you may be dismissed from the clinic at the provider's discretion.      For prescription refill requests, have your pharmacy contact our office and allow 72 hours for refills to be completed.    Today you received the following chemotherapy and/or immunotherapy agents: fam-trastuzumab deruxtecan-nxki      To help prevent nausea and vomiting after your treatment, we encourage you to take your nausea medication as directed.  BELOW ARE SYMPTOMS THAT SHOULD BE REPORTED IMMEDIATELY: *FEVER GREATER THAN 100.4 F (38 C) OR HIGHER *CHILLS OR SWEATING *NAUSEA AND VOMITING THAT IS NOT CONTROLLED WITH YOUR NAUSEA MEDICATION *UNUSUAL SHORTNESS OF BREATH *UNUSUAL BRUISING OR BLEEDING *URINARY PROBLEMS (pain or burning when urinating, or frequent urination) *BOWEL PROBLEMS (unusual diarrhea, constipation, pain near the anus) TENDERNESS IN MOUTH AND THROAT WITH OR WITHOUT PRESENCE OF ULCERS (sore throat, sores in mouth, or a toothache) UNUSUAL RASH, SWELLING OR PAIN  UNUSUAL VAGINAL DISCHARGE OR ITCHING   Items with * indicate a potential emergency and should be followed up as soon as possible or go to the Emergency Department if any problems should occur.  Please show the CHEMOTHERAPY ALERT CARD or  IMMUNOTHERAPY ALERT CARD at check-in to the Emergency Department and triage nurse.  Should you have questions after your visit or need to cancel or reschedule your appointment, please contact [facility]  Dept: [phone]  and follow the prompts.  Office hours are 8:00 a.m. to 4:30 p.m. Monday - Friday. Please note that voicemails left after 4:00 p.m. may not be returned until the following business day.  We are closed weekends and major holidays. You have access to a nurse at all times for urgent questions. Please call the main number to the clinic Dept: [phone] and follow the prompts.   For any non-urgent questions, you may also contact your provider using MyChart. We now offer e-Visits for anyone 18 and older to request care online for non-urgent symptoms. For details visit [email or web address].   Also download the MyChart app! Go to the app store, search "MyChart", open the app, select , and log in with your MyChart username and password.   

## 2022-05-26 NOTE — Assessment & Plan Note (Signed)
08/04/20: Right mastectomy (Dr. 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 Neoadjuvant chemotherapy with TCH Perjeta 6 cycles followed by Kadcyla maintenance discontinued 11/11/2020 because of peripheral neuropathy 2. Followed by mastectomy with sentinel lymph node study 08/04/20 3. Followed by adjuvant radiation therapy 09/18/2020- 11/04/2020 4.  Decided not to do antiestrogen therapy because her ER was 0% on final path. 5.  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 Bone scan 08/03/2021: No definite evidence of metastatic disease  Echocardiogram 07/22/2021: EF 60 to 65%    Current treatment: Cycle 15 Enhertu Enhertu toxicities:  1. Fatigue that lasted for 3-5 days 2. nausea: Much improved 3.  Insomnia 4.  Worsening neuropathy: stable 5.  Hair thinning 6.  Diabetes: Patient will find a primary care physician to discuss options for treatment.  I suggested GLP-1 treatments like Ozempic to help reduce weight and control her sugars.   CT CAP 04/13/2022: Similar tiny solid and nonsolid pulmonary nodules, no acute process of metastatic disease in the abdomen or pelvis, hepatic steatosis, pelvic congestion syndrome   This cutaneous metastases are improving clinically Return to clinic every 3 weeks for Enhertu

## 2022-05-28 ENCOUNTER — Other Ambulatory Visit (HOSPITAL_COMMUNITY): Payer: Self-pay

## 2022-05-28 ENCOUNTER — Encounter: Payer: Self-pay | Admitting: Hematology and Oncology

## 2022-05-31 ENCOUNTER — Other Ambulatory Visit: Payer: Self-pay

## 2022-05-31 ENCOUNTER — Other Ambulatory Visit (HOSPITAL_COMMUNITY): Payer: Self-pay

## 2022-06-16 ENCOUNTER — Encounter: Payer: Self-pay | Admitting: Adult Health

## 2022-06-16 ENCOUNTER — Inpatient Hospital Stay: Payer: Medicare Other

## 2022-06-16 ENCOUNTER — Inpatient Hospital Stay: Payer: Medicare Other | Attending: Hematology and Oncology

## 2022-06-16 ENCOUNTER — Other Ambulatory Visit: Payer: Self-pay

## 2022-06-16 ENCOUNTER — Inpatient Hospital Stay (HOSPITAL_BASED_OUTPATIENT_CLINIC_OR_DEPARTMENT_OTHER): Payer: Medicare Other | Admitting: Adult Health

## 2022-06-16 VITALS — BP 149/78 | HR 83 | Temp 97.2°F | Resp 18 | Ht 66.0 in | Wt 194.3 lb

## 2022-06-16 DIAGNOSIS — Z170 Estrogen receptor positive status [ER+]: Secondary | ICD-10-CM | POA: Insufficient documentation

## 2022-06-16 DIAGNOSIS — Z5112 Encounter for antineoplastic immunotherapy: Secondary | ICD-10-CM | POA: Insufficient documentation

## 2022-06-16 DIAGNOSIS — E119 Type 2 diabetes mellitus without complications: Secondary | ICD-10-CM | POA: Insufficient documentation

## 2022-06-16 DIAGNOSIS — G620 Drug-induced polyneuropathy: Secondary | ICD-10-CM | POA: Diagnosis not present

## 2022-06-16 DIAGNOSIS — K5900 Constipation, unspecified: Secondary | ICD-10-CM | POA: Insufficient documentation

## 2022-06-16 DIAGNOSIS — C773 Secondary and unspecified malignant neoplasm of axilla and upper limb lymph nodes: Secondary | ICD-10-CM | POA: Insufficient documentation

## 2022-06-16 DIAGNOSIS — C50411 Malignant neoplasm of upper-outer quadrant of right female breast: Secondary | ICD-10-CM | POA: Insufficient documentation

## 2022-06-16 DIAGNOSIS — R5383 Other fatigue: Secondary | ICD-10-CM

## 2022-06-16 LAB — CMP (CANCER CENTER ONLY)
ALT: 37 U/L (ref 0–44)
AST: 37 U/L (ref 15–41)
Albumin: 3.7 g/dL (ref 3.5–5.0)
Alkaline Phosphatase: 121 U/L (ref 38–126)
Anion gap: 7 (ref 5–15)
BUN: 6 mg/dL — ABNORMAL LOW (ref 8–23)
CO2: 25 mmol/L (ref 22–32)
Calcium: 9.5 mg/dL (ref 8.9–10.3)
Chloride: 106 mmol/L (ref 98–111)
Creatinine: 0.63 mg/dL (ref 0.44–1.00)
GFR, Estimated: >60 mL/min (ref >60)
Glucose, Bld: 172 mg/dL — ABNORMAL HIGH (ref 70–99)
Potassium: 3.7 mmol/L (ref 3.5–5.1)
Sodium: 138 mmol/L (ref 135–145)
Total Bilirubin: 0.4 mg/dL (ref 0.3–1.2)
Total Protein: 7.1 g/dL (ref 6.5–8.1)

## 2022-06-16 LAB — CBC WITH DIFFERENTIAL (CANCER CENTER ONLY)
Abs Immature Granulocytes: 0.01 10*3/uL (ref 0.00–0.07)
Basophils Absolute: 0.1 10*3/uL (ref 0.0–0.1)
Basophils Relative: 1 %
Eosinophils Absolute: 0.2 10*3/uL (ref 0.0–0.5)
Eosinophils Relative: 3 %
HCT: 42 % (ref 36.0–46.0)
Hemoglobin: 14.2 g/dL (ref 12.0–15.0)
Immature Granulocytes: 0 %
Lymphocytes Relative: 23 %
Lymphs Abs: 1.3 10*3/uL (ref 0.7–4.0)
MCH: 33 pg (ref 26.0–34.0)
MCHC: 33.8 g/dL (ref 30.0–36.0)
MCV: 97.7 fL (ref 80.0–100.0)
Monocytes Absolute: 0.5 10*3/uL (ref 0.1–1.0)
Monocytes Relative: 9 %
Neutro Abs: 3.8 10*3/uL (ref 1.7–7.7)
Neutrophils Relative %: 64 %
Platelet Count: 194 10*3/uL (ref 150–400)
RBC: 4.3 MIL/uL (ref 3.87–5.11)
RDW: 13.7 % (ref 11.5–15.5)
WBC Count: 6 10*3/uL (ref 4.0–10.5)
nRBC: 0 % (ref 0.0–0.2)

## 2022-06-16 MED ORDER — PALONOSETRON HCL INJECTION 0.25 MG/5ML
0.2500 mg | Freq: Once | INTRAVENOUS | Status: AC
  Administered 2022-06-16: 0.25 mg via INTRAVENOUS
  Filled 2022-06-16: qty 5

## 2022-06-16 MED ORDER — HEPARIN SOD (PORK) LOCK FLUSH 100 UNIT/ML IV SOLN
500.0000 [IU] | Freq: Once | INTRAVENOUS | Status: AC | PRN
  Administered 2022-06-16: 500 [IU]

## 2022-06-16 MED ORDER — DIPHENHYDRAMINE HCL 25 MG PO CAPS
25.0000 mg | ORAL_CAPSULE | Freq: Once | ORAL | Status: AC
  Administered 2022-06-16: 25 mg via ORAL
  Filled 2022-06-16: qty 1

## 2022-06-16 MED ORDER — ACETAMINOPHEN 325 MG PO TABS
650.0000 mg | ORAL_TABLET | Freq: Once | ORAL | Status: AC
  Administered 2022-06-16: 650 mg via ORAL
  Filled 2022-06-16: qty 2

## 2022-06-16 MED ORDER — SODIUM CHLORIDE 0.9 % IV SOLN
10.0000 mg | Freq: Once | INTRAVENOUS | Status: AC
  Administered 2022-06-16: 10 mg via INTRAVENOUS
  Filled 2022-06-16: qty 10

## 2022-06-16 MED ORDER — SODIUM CHLORIDE 0.9 % IV SOLN
150.0000 mg | Freq: Once | INTRAVENOUS | Status: AC
  Administered 2022-06-16: 150 mg via INTRAVENOUS
  Filled 2022-06-16: qty 150

## 2022-06-16 MED ORDER — SODIUM CHLORIDE 0.9% FLUSH
10.0000 mL | INTRAVENOUS | Status: DC | PRN
  Administered 2022-06-16: 10 mL

## 2022-06-16 MED ORDER — SODIUM CHLORIDE 0.9% FLUSH
10.0000 mL | Freq: Once | INTRAVENOUS | Status: AC
  Administered 2022-06-16: 10 mL

## 2022-06-16 MED ORDER — FAM-TRASTUZUMAB DERUXTECAN-NXKI CHEMO 100 MG IV SOLR
3.2000 mg/kg | Freq: Once | INTRAVENOUS | Status: AC
  Administered 2022-06-16: 300 mg via INTRAVENOUS
  Filled 2022-06-16: qty 15

## 2022-06-16 MED ORDER — DEXTROSE 5 % IV SOLN: Freq: Once | INTRAVENOUS | Status: AC

## 2022-06-16 NOTE — Assessment & Plan Note (Signed)
08/04/20: Right mastectomy (Dr. 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 Neoadjuvant chemotherapy with TCH Perjeta 6 cycles followed by Kadcyla maintenance discontinued 11/11/2020 because of peripheral neuropathy 2. Followed by mastectomy with sentinel lymph node study 08/04/20 3. Followed by adjuvant radiation therapy 09/18/2020- 11/04/2020 4.  Decided not to do antiestrogen therapy because her ER was 0% on final path. 5.  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 Bone scan 08/03/2021: No definite evidence of metastatic disease  Echocardiogram 07/22/2021: EF 60 to 65%    Current treatment: Enhertu Enhertu toxicities:  1. Fatigue: Managed with energy conservation 2.  Constipation: Managed with over-the-counter regimen. 3.  Insomnia 4.  Worsening neuropathy: stable 5.  Hair thinning 6.  Diabetes: On Ozempic.  Sugars are improved.  Restaging CT chest ordered for mid July.  I also entered more orders so she can return every 3 weeks for 2 more cycles while we obtain repeat imaging.

## 2022-06-16 NOTE — Progress Notes (Signed)
[facility] Cancer Follow up:    [name], MD [address] [place] [address]   DIAGNOSIS:  Cancer Staging  Malignant neoplasm of upper-outer quadrant of right breast in female, estrogen receptor positive (HCC) Staging form: Breast, AJCC 8th Edition - Clinical stage from 03/10/2020: Stage IIIA (cT3, cN1, cM0, G3, ER+, PR-, HER2+) - Signed by [name], MD on 03/10/2020 Stage prefix: Initial diagnosis - Pathologic stage from 08/04/2020: No Stage Recommended (ypTis (DCIS), pN2, cM0) - Signed by Loa Socks, NP on 02/05/2021 Stage prefix: Post-therapy   SUMMARY OF ONCOLOGIC HISTORY: Oncology History  Malignant neoplasm of upper-outer quadrant of right breast in female, estrogen receptor positive (HCC)  03/10/2020 Initial Diagnosis   Palpable right breast mass x6 months.  Mammogram revealed left breast cysts (intraductal papilloma), 11 cm right breast mass and 2 masses 2.6 cm each in the right axilla: Biopsy grade 3 IDC ER 5% weak, PR 0%, Ki-67 25%, HER-2 3+ IHC positive   03/10/2020 Cancer Staging   Staging form: Breast, AJCC 8th Edition - Clinical stage from 03/10/2020: Stage IIIA (cT3, cN1, cM0, G3, ER+, PR-, HER2+) - Signed by [name], MD on 03/10/2020 Stage prefix: Initial diagnosis   03/19/2020 - 07/01/2020 Chemotherapy   TCH Perjeta   08/04/2020 Surgery   Right mastectomy (Dr. 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, high-grade DCIS with necrosis and calcifications, and 2 right axillary lymph nodes positive for metastatic carcinoma   08/04/2020 Cancer Staging   Staging form: Breast, AJCC 8th Edition - Pathologic stage from 08/04/2020: No Stage Recommended (ypTis (DCIS), pN2, cM0) - Signed by Loa Socks, NP on 02/05/2021 Stage prefix: Post-therapy   08/21/2020 - 10/23/2020 Chemotherapy   Discontinued due to peripheral neuropathy Patient is on Treatment Plan : BREAST  ADO-Trastuzumab Emtansine (Kadcyla) q21d      08/2020 -  Anti-estrogen oral therapy    Decided not to do antiestrogen therapy because her ER was 0% on final path.   10/06/2020 - 11/04/2020 Radiation Therapy   Adjuvant radiation    Relapse/Recurrence   Right mastectomy area skin changes: Initially treated for shingles but since lesions did not improve skin biopsy was obtained on 07/14/2021: Infiltrating carcinoma consistent with breast origin CK7 and GA TA 3 positive, ER 0%, PR 0%, HER2 2+ by IHC, FISH negative ratio 1.56, copy #2.1   07/26/2021 -  Chemotherapy   Patient is on Treatment Plan : BREAST METASTATIC Fam-Trastuzumab Deruxtecan-nxki (Enhertu) (5.4) q21d     01/14/2022 Imaging   CT Chest: Slight decrease in size of the right lower lobe pulmonary nodule. Stable tiny non solid nodule in the superior segment of left lower lobe. No new pulmonary nodules. Stable surgical changes from right mastectomy and axillary lymph node dissection. No new developing lymph node enlargement or other mass lesion.  Chest port. Fatty liver infiltration. Aortic Atherosclerosis (ICD10-I70.0).     CURRENT THERAPY: Enhertu  INTERVAL HISTORY: YAZLYNN BIRKELAND 67 y.o. female returns for follow-up and evaluation prior to receiving Enhertu.  She is doing well today.  She tells me that on the day of treatment she experiences constipation that lasts for a couple days.  She is managing this with stool softeners and MiraLAX.  She denies any cough shortness of breath chest pain or funny heartbeats.  She notes a slight decreased appetite however she is taking Ozempic that she started 2 weeks ago.  Her blood sugar today is 172 which is about  30 points improved from previous blood sugars obtained here.      Patient Active Problem List   Diagnosis Date Noted   Essential (primary) hypertension 01/19/2022   Type 2 diabetes mellitus without complication, without long-term current use of insulin (HCC) 01/19/2022   Fatigue  04/08/2021   S/P mastectomy, right 08/04/2020   Chemotherapy-induced peripheral neuropathy (HCC) 05/20/2020   Malignant neoplasm of upper-outer quadrant of right breast in female, estrogen receptor positive (HCC) 03/10/2020   Fibroid    Smoker    Depression     has No Known Allergies.  MEDICAL HISTORY: Past Medical History:  Diagnosis Date   Acid reflux    Anxiety    Breast cancer (HCC)    Breast cancer (HCC) 2022   Depression    HISTORY OF   Fibroid    PONV (postoperative nausea and vomiting)    Smoker     SURGICAL HISTORY: Past Surgical History:  Procedure Laterality Date   CESAREAN SECTION     X 2   MASTECTOMY     MASTECTOMY WITH AXILLARY LYMPH NODE DISSECTION Right 08/04/2020   Procedure: RIGHT MASTECTOMY WITH RIGHT AXILLARY LYMPH NODE DISSECTION;  Surgeon: [name], MD;  Location: [facility] OR;  Service: General;  Laterality: Right;   PORTACATH PLACEMENT Left 03/17/2020   Procedure: INSERTION PORT-A-CATH;  Surgeon: [name], MD;  Location: [facility];  Service: General;  Laterality: Left;   PORTACATH PLACEMENT Left 08/05/2021   Procedure: PORT PLACEMENT WITH ULTRASOUND GUIDANCE;  Surgeon: [name], MD;  Location: [facility];  Service: General;  Laterality: Left;  LMA    SOCIAL HISTORY: Social History   Socioeconomic History   Marital status: Married    Spouse name: Not on file   Number of children: Not on file   Years of education: Not on file   Highest education level: Not on file  Occupational History   Not on file  Tobacco Use   Smoking status: Every Day    Packs/day: .5    Types: Cigarettes   Smokeless tobacco: Never  Vaping Use   Vaping Use: Never used  Substance and Sexual Activity   Alcohol use: Yes    Comment: occasionally   Drug use: Never   Sexual activity: Yes    Birth control/protection: Post-menopausal  Other Topics Concern   Not on file  Social History Narrative   Right handed     Caffeine- 5-6 cups per day    Social Determinants of Health   Financial Resource Strain: Low Risk  (09/01/2020)   Overall Financial Resource Strain (CARDIA)    Difficulty of Paying Living Expenses: Not hard at all  Food Insecurity: No Food Insecurity (09/01/2020)   Hunger Vital Sign    Worried About Running Out of Food in the Last Year: Never true    Ran Out of Food in the Last Year: Never true  Transportation Needs: No Transportation Needs (09/01/2020)   PRAPARE - Administrator, Civil Service (Medical): No    Lack of Transportation (Non-Medical): No  Physical Activity: Not on file  Stress: Not on file  Social Connections: Not on file  Intimate Partner Violence: Not on file    FAMILY HISTORY: Family History  Problem Relation Age of Onset   Breast cancer Mother    Diabetes Mother    Hypertension Mother    Hypertension Father    Hypertension Sister    Hypertension Brother     Review of Systems  Constitutional:  Positive for fatigue. Negative for appetite change, chills, fever and unexpected weight change.  HENT:   Negative for hearing loss, lump/mass and trouble swallowing.   Eyes:  Negative for eye problems and icterus.  Respiratory:  Negative for chest tightness, cough and shortness of breath.   Cardiovascular:  Negative for chest pain, leg swelling and palpitations.  Gastrointestinal:  Positive for constipation. Negative for abdominal distention, abdominal pain, diarrhea, nausea and vomiting.  Endocrine: Negative for hot flashes.  Genitourinary:  Negative for difficulty urinating.   Musculoskeletal:  Negative for arthralgias.  Skin:  Negative for itching and rash.  Neurological:  Negative for dizziness, extremity weakness, headaches and numbness.  Hematological:  Negative for adenopathy. Does not bruise/bleed easily.  Psychiatric/Behavioral:  Negative for depression. The patient is not nervous/anxious.       PHYSICAL EXAMINATION   Onc Performance Status -  06/16/22 0850       ECOG Perf Status   ECOG Perf Status Restricted in physically strenuous activity but ambulatory and able to carry out work of a light or sedentary nature, e.g., light house work, office work      KPS SCALE   KPS % SCORE Able to carry on normal activity, minor s/s of disease             Vitals:   06/16/22 0846  BP: (!) 149/78  Pulse: 83  Resp: 18  Temp: (!) 97.2 F (36.2 C)  SpO2: 98%    Physical Exam Constitutional:      General: She is not in acute distress.    Appearance: Normal appearance. She is not toxic-appearing.  HENT:     Head: Normocephalic and atraumatic.     Mouth/Throat:     Mouth: Mucous membranes are moist.     Pharynx: Oropharynx is clear. No oropharyngeal exudate or posterior oropharyngeal erythema.  Eyes:     General: No scleral icterus. Cardiovascular:     Rate and Rhythm: Normal rate and regular rhythm.     Pulses: Normal pulses.     Heart sounds: Normal heart sounds.  Pulmonary:     Effort: Pulmonary effort is normal.     Breath sounds: Normal breath sounds.  Abdominal:     General: Abdomen is flat. Bowel sounds are normal. There is no distension.     Palpations: Abdomen is soft.     Tenderness: There is no abdominal tenderness.  Musculoskeletal:        General: No swelling.     Cervical back: Neck supple.  Lymphadenopathy:     Cervical: No cervical adenopathy.  Skin:    General: Skin is warm and dry.     Findings: No rash.  Neurological:     General: No focal deficit present.     Mental Status: She is alert.  Psychiatric:        Mood and Affect: Mood normal.        Behavior: Behavior normal.     LABORATORY DATA:  CBC    Component Value Date/Time   WBC 6.0 06/16/2022 0823   WBC 6.8 08/18/2021 0934   RBC 4.30 06/16/2022 0823   HGB 14.2 06/16/2022 0823   HCT 42.0 06/16/2022 0823   PLT 194 06/16/2022 0823   MCV 97.7 06/16/2022 0823   MCH 33.0 06/16/2022 0823   MCHC 33.8 06/16/2022 0823   RDW 13.7  06/16/2022 0823   LYMPHSABS 1.3 06/16/2022 0823   MONOABS 0.5 06/16/2022 0823   EOSABS 0.2 06/16/2022 1610  BASOSABS 0.1 06/16/2022 0823    CMP     Component Value Date/Time   NA 138 06/16/2022 0823   K 3.7 06/16/2022 0823   CL 106 06/16/2022 0823   CO2 25 06/16/2022 0823   GLUCOSE 172 (H) 06/16/2022 0823   BUN 6 (L) 06/16/2022 0823   CREATININE 0.63 06/16/2022 0823   CALCIUM 9.5 06/16/2022 0823   PROT 7.1 06/16/2022 0823   ALBUMIN 3.7 06/16/2022 0823   AST 37 06/16/2022 0823   ALT 37 06/16/2022 0823   ALKPHOS 121 06/16/2022 0823   BILITOT 0.4 06/16/2022 0823   GFRNONAA >60 06/16/2022 0823     ASSESSMENT and THERAPY PLAN:   Malignant neoplasm of upper-outer quadrant of right breast in female, estrogen receptor positive (HCC) 08/04/20: Right mastectomy (Dr. 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 Neoadjuvant chemotherapy with TCH Perjeta 6 cycles followed by Kadcyla maintenance discontinued 11/11/2020 because of peripheral neuropathy 2. Followed by mastectomy with sentinel lymph node study 08/04/20 3. Followed by adjuvant radiation therapy 09/18/2020- 11/04/2020 4.  Decided not to do antiestrogen therapy because her ER was 0% on final path. 5.  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 Bone scan 08/03/2021: No definite  evidence of metastatic disease  Echocardiogram 07/22/2021: EF 60 to 65%    Current treatment: Enhertu Enhertu toxicities:  1. Fatigue: Managed with energy conservation 2.  Constipation: Managed with over-the-counter regimen. 3.  Insomnia 4.  Worsening neuropathy: stable 5.  Hair thinning 6.  Diabetes: On Ozempic.  Sugars are improved.  Restaging CT chest ordered for mid July.  I also entered more orders so she can return every 3 weeks for 2 more cycles while we obtain repeat imaging.   All questions were answered. The patient knows to call the clinic with any problems, questions or concerns. We can certainly see the patient much sooner if necessary.  Total encounter time:20 minutes*in face-to-face visit time, chart review, lab review, care coordination, order entry, and documentation of the encounter time.    Lillard Anes, NP 06/16/22 9:40 AM Medical Oncology and Hematology [facility] [address] [place], [place] [address] Tel. [phone]    Fax. [phone]  *Total Encounter Time as defined by the Centers for Medicare and Medicaid Services includes, in addition to the face-to-face time of a patient visit (documented in the note above) non-face-to-face time: obtaining and reviewing outside history, ordering and reviewing medications, tests or procedures, care coordination (communications with other health care professionals or caregivers) and documentation in the medical record.

## 2022-06-16 NOTE — Patient Instructions (Signed)
[facility]  Discharge Instructions: Thank you for choosing [facility] to provide your oncology and hematology care.   If you have a lab appointment with the Cancer Center, please go directly to the Cancer Center and check in at the registration area.   Wear comfortable clothing and clothing appropriate for easy access to any Portacath or PICC line.   We strive to give you quality time with your provider. You may need to reschedule your appointment if you arrive late (15 or more minutes).  Arriving late affects you and other patients whose appointments are after yours.  Also, if you miss three or more appointments without notifying the office, you may be dismissed from the clinic at the provider's discretion.      For prescription refill requests, have your pharmacy contact our office and allow 72 hours for refills to be completed.    Today you received the following chemotherapy and/or immunotherapy agents Enhertu.   To help prevent nausea and vomiting after your treatment, we encourage you to take your nausea medication as directed.  BELOW ARE SYMPTOMS THAT SHOULD BE REPORTED IMMEDIATELY: *FEVER GREATER THAN 100.4 F (38 C) OR HIGHER *CHILLS OR SWEATING *NAUSEA AND VOMITING THAT IS NOT CONTROLLED WITH YOUR NAUSEA MEDICATION *UNUSUAL SHORTNESS OF BREATH *UNUSUAL BRUISING OR BLEEDING *URINARY PROBLEMS (pain or burning when urinating, or frequent urination) *BOWEL PROBLEMS (unusual diarrhea, constipation, pain near the anus) TENDERNESS IN MOUTH AND THROAT WITH OR WITHOUT PRESENCE OF ULCERS (sore throat, sores in mouth, or a toothache) UNUSUAL RASH, SWELLING OR PAIN  UNUSUAL VAGINAL DISCHARGE OR ITCHING   Items with * indicate a potential emergency and should be followed up as soon as possible or go to the Emergency Department if any problems should occur.  Please show the CHEMOTHERAPY ALERT CARD or IMMUNOTHERAPY ALERT CARD at check-in  to the Emergency Department and triage nurse.  Should you have questions after your visit or need to cancel or reschedule your appointment, please contact [facility]  Dept: [phone]  and follow the prompts.  Office hours are 8:00 a.m. to 4:30 p.m. Monday - Friday. Please note that voicemails left after 4:00 p.m. may not be returned until the following business day.  We are closed weekends and major holidays. You have access to a nurse at all times for urgent questions. Please call the main number to the clinic Dept: [phone] and follow the prompts.   For any non-urgent questions, you may also contact your provider using MyChart. We now offer e-Visits for anyone 18 and older to request care online for non-urgent symptoms. For details visit [email or web address].   Also download the MyChart app! Go to the app store, search "MyChart", open the app, select , and log in with your MyChart username and password.   

## 2022-06-18 ENCOUNTER — Other Ambulatory Visit: Payer: Self-pay

## 2022-06-20 ENCOUNTER — Encounter: Payer: Self-pay | Admitting: Hematology and Oncology

## 2022-07-02 NOTE — Progress Notes (Signed)
Patient Care Team: [name], MD as PCP - General (Hematology and Oncology) [name], MD as Consulting Physician (General Surgery) Diani Puffer, MD as Consulting Physician (Radiation Oncology)  DIAGNOSIS: No diagnosis found.  SUMMARY OF ONCOLOGIC HISTORY: Oncology History  Malignant neoplasm of upper-outer quadrant of right breast in female, estrogen receptor positive (HCC)  03/10/2020 Initial Diagnosis   Palpable right breast mass x6 months.  Mammogram revealed left breast cysts (intraductal papilloma), 11 cm right breast mass and 2 masses 2.6 cm each in the right axilla: Biopsy grade 3 IDC ER 5% weak, PR 0%, Ki-67 25%, HER-2 3+ IHC positive   03/10/2020 Cancer Staging   Staging form: Breast, AJCC 8th Edition - Clinical stage from 03/10/2020: Stage IIIA (cT3, cN1, cM0, G3, ER+, PR-, HER2+) - Signed by [name], MD on 03/10/2020 Stage prefix: Initial diagnosis   03/19/2020 - 07/01/2020 Chemotherapy   TCH Perjeta   08/04/2020 Surgery   Right mastectomy (Dr. 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, high-grade DCIS with necrosis and calcifications, and 2 right axillary lymph nodes positive for metastatic carcinoma   08/04/2020 Cancer Staging   Staging form: Breast, AJCC 8th Edition - Pathologic stage from 08/04/2020: No Stage Recommended (ypTis (DCIS), pN2, cM0) - Signed by Loa Socks, NP on 02/05/2021 Stage prefix: Post-therapy   08/21/2020 - 10/23/2020 Chemotherapy   Discontinued due to peripheral neuropathy Patient is on Treatment Plan : BREAST ADO-Trastuzumab Emtansine (Kadcyla) q21d      08/2020 -  Anti-estrogen oral therapy    Decided not to do antiestrogen therapy because her ER was 0% on final path.   10/06/2020 - 11/04/2020 Radiation Therapy   Adjuvant radiation    Relapse/Recurrence   Right mastectomy area skin changes: Initially treated for shingles but since lesions did not improve skin biopsy was  obtained on 07/14/2021: Infiltrating carcinoma consistent with breast origin CK7 and GA TA 3 positive, ER 0%, PR 0%, HER2 2+ by IHC, FISH negative ratio 1.56, copy #2.1   07/26/2021 -  Chemotherapy   Patient is on Treatment Plan : BREAST METASTATIC Fam-Trastuzumab Deruxtecan-nxki (Enhertu) (5.4) q21d     01/14/2022 Imaging   CT Chest: Slight decrease in size of the right lower lobe pulmonary nodule. Stable tiny non solid nodule in the superior segment of left lower lobe. No new pulmonary nodules. Stable surgical changes from right mastectomy and axillary lymph node dissection. No new developing lymph node enlargement or other mass lesion.  Chest port. Fatty liver infiltration. Aortic Atherosclerosis (ICD10-I70.0).     CHIEF COMPLIANT: Follow-up right breast cancer/ On cycle 16 Enhertu   INTERVAL HISTORY: Sabrina Woods is a 67 yo  female with above-mentioned history of right breast cancer. She presents to the clinic today for a follow-up.    ALLERGIES:  has No Known Allergies.  MEDICATIONS:  Current Outpatient Medications  Medication Sig Dispense Refill   ACCU-CHEK GUIDE test strip See admin instructions.     ALPRAZolam (XANAX) 0.25 MG tablet Take 1 tablet (0.25 mg total) by mouth 2 (two) times daily as needed for anxiety. 60 tablet 3   blood glucose meter kit and supplies KIT Dispense based on patient and insurance preference. Use up to four times daily as directed. 1 each 0   dapagliflozin propanediol (FARXIGA) 10 MG TABS tablet Take 1 tablet (10 mg total) by mouth daily. 90 tablet 3   esomeprazole (NEXIUM) 20 MG capsule Take 20 mg by mouth daily.  gabapentin (NEURONTIN) 600 MG tablet Take 1 tablet (600 mg total) by mouth 3 (three) times daily. 90 tablet 3   glipiZIDE (GLUCOTROL) 5 MG tablet Take 1 tablet (5 mg total) by mouth daily before breakfast. 30 tablet 1   lidocaine-prilocaine (EMLA) cream APPLY TO AFFECTED AREA ONCE AS DIRECTED     loratadine (CLARITIN) 10 MG tablet Take 10  mg by mouth daily as needed for rhinitis.     Multiple Vitamin (MULTIVITAMIN) capsule Take 1 capsule by mouth daily.     nicotine (NICODERM CQ - DOSED IN MG/24 HR) 7 mg/24hr patch PLACE 1 PATCH (7 MG TOTAL) ONTO THE SKIN DAILY.     ondansetron (ZOFRAN) 24 MG tablet Take 24 mg by mouth once.     prochlorperazine (COMPAZINE) 10 MG tablet Take 1 tablet (10 mg total) by mouth every 6 (six) hours as needed (Nausea or vomiting). 30 tablet 1   Semaglutide,0.25 or 0.5MG /DOS, (OZEMPIC, 0.25 OR 0.5 MG/DOSE,) 2 MG/3ML SOPN Inject 0.25 mg into the skin once a week. 3 mL 0   UNABLE TO FIND Take by mouth in the morning, at noon, and at bedtime. palmitoylethanolamide luteolin.     No current facility-administered medications for this visit.    PHYSICAL EXAMINATION: ECOG PERFORMANCE STATUS: {CHL ONC ECOG PS:[phone]}  There were no vitals filed for this visit. There were no vitals filed for this visit.  BREAST:*** No palpable masses or nodules in either right or left breasts. No palpable axillary supraclavicular or infraclavicular adenopathy no breast tenderness or nipple discharge. (exam performed in the presence of a chaperone)  LABORATORY DATA:  I have reviewed the data as listed    Latest Ref Rng & Units 06/16/2022    8:23 AM 05/26/2022    9:00 AM 05/05/2022    9:02 AM  CMP  Glucose 70 - 99 mg/dL 811  914  782   BUN 8 - 23 mg/dL 6  9  9    Creatinine 0.44 - 1.00 mg/dL 9.56  2.13  0.86   Sodium 135 - 145 mmol/L 138  139  137   Potassium 3.5 - 5.1 mmol/L 3.7  3.9  3.7   Chloride 98 - 111 mmol/L 106  106  105   CO2 22 - 32 mmol/L 25  26  27    Calcium 8.9 - 10.3 mg/dL 9.5  9.4  9.4   Total Protein 6.5 - 8.1 g/dL 7.1  6.8  6.8   Total Bilirubin 0.3 - 1.2 mg/dL 0.4  0.4  0.5   Alkaline Phos 38 - 126 U/L 121  126  120   AST 15 - 41 U/L 37  34  37   ALT 0 - 44 U/L 37  35  40     Lab Results  Component Value Date   WBC 6.0 06/16/2022   HGB 14.2 06/16/2022   HCT 42.0 06/16/2022   MCV 97.7  06/16/2022   PLT 194 06/16/2022   NEUTROABS 3.8 06/16/2022    ASSESSMENT & PLAN:  No problem-specific Assessment & Plan notes found for this encounter.    No orders of the defined types were placed in this encounter.  The patient has a good understanding of the overall plan. she agrees with it. she will call with any problems that may develop before the next visit here. Total time spent: 30 mins including face to face time and time spent for planning, charting and co-ordination of care   Sherlyn Lick, CMA 07/02/22  I Gardiner Coins am acting as a Education administrator for [employer]  ***

## 2022-07-04 MED FILL — Dexamethasone Sodium Phosphate Inj 100 MG/10ML: INTRAMUSCULAR | Qty: 1 | Status: AC

## 2022-07-04 MED FILL — Fosaprepitant Dimeglumine For IV Infusion 150 MG (Base Eq): INTRAVENOUS | Qty: 5 | Status: AC

## 2022-07-05 ENCOUNTER — Other Ambulatory Visit: Payer: Self-pay

## 2022-07-05 ENCOUNTER — Inpatient Hospital Stay (HOSPITAL_BASED_OUTPATIENT_CLINIC_OR_DEPARTMENT_OTHER): Payer: Medicare Other | Admitting: Hematology and Oncology

## 2022-07-05 ENCOUNTER — Inpatient Hospital Stay: Payer: Medicare Other

## 2022-07-05 ENCOUNTER — Other Ambulatory Visit (HOSPITAL_COMMUNITY): Payer: Self-pay

## 2022-07-05 ENCOUNTER — Inpatient Hospital Stay: Payer: Medicare Other | Attending: Hematology and Oncology

## 2022-07-05 VITALS — BP 162/91 | HR 89 | Temp 97.2°F | Resp 18 | Ht 66.0 in | Wt 192.2 lb

## 2022-07-05 VITALS — BP 138/68 | HR 75 | Temp 97.7°F

## 2022-07-05 DIAGNOSIS — Z170 Estrogen receptor positive status [ER+]: Secondary | ICD-10-CM

## 2022-07-05 DIAGNOSIS — Z5112 Encounter for antineoplastic immunotherapy: Secondary | ICD-10-CM | POA: Insufficient documentation

## 2022-07-05 DIAGNOSIS — C50411 Malignant neoplasm of upper-outer quadrant of right female breast: Secondary | ICD-10-CM | POA: Insufficient documentation

## 2022-07-05 DIAGNOSIS — R911 Solitary pulmonary nodule: Secondary | ICD-10-CM | POA: Insufficient documentation

## 2022-07-05 DIAGNOSIS — E119 Type 2 diabetes mellitus without complications: Secondary | ICD-10-CM | POA: Diagnosis not present

## 2022-07-05 DIAGNOSIS — C773 Secondary and unspecified malignant neoplasm of axilla and upper limb lymph nodes: Secondary | ICD-10-CM | POA: Diagnosis not present

## 2022-07-05 LAB — CBC WITH DIFFERENTIAL (CANCER CENTER ONLY)
Abs Immature Granulocytes: 0.01 10*3/uL (ref 0.00–0.07)
Basophils Absolute: 0.1 10*3/uL (ref 0.0–0.1)
Basophils Relative: 1 %
Eosinophils Absolute: 0.2 10*3/uL (ref 0.0–0.5)
Eosinophils Relative: 3 %
HCT: 41.5 % (ref 36.0–46.0)
Hemoglobin: 13.9 g/dL (ref 12.0–15.0)
Immature Granulocytes: 0 %
Lymphocytes Relative: 20 %
Lymphs Abs: 1.2 10*3/uL (ref 0.7–4.0)
MCH: 32.6 pg (ref 26.0–34.0)
MCHC: 33.5 g/dL (ref 30.0–36.0)
MCV: 97.2 fL (ref 80.0–100.0)
Monocytes Absolute: 0.5 10*3/uL (ref 0.1–1.0)
Monocytes Relative: 8 %
Neutro Abs: 4 10*3/uL (ref 1.7–7.7)
Neutrophils Relative %: 68 %
Platelet Count: 185 10*3/uL (ref 150–400)
RBC: 4.27 MIL/uL (ref 3.87–5.11)
RDW: 13.5 % (ref 11.5–15.5)
WBC Count: 6 10*3/uL (ref 4.0–10.5)
nRBC: 0 % (ref 0.0–0.2)

## 2022-07-05 LAB — CMP (CANCER CENTER ONLY)
ALT: 38 U/L (ref 0–44)
AST: 40 U/L (ref 15–41)
Albumin: 3.7 g/dL (ref 3.5–5.0)
Alkaline Phosphatase: 113 U/L (ref 38–126)
Anion gap: 7 (ref 5–15)
BUN: 6 mg/dL — ABNORMAL LOW (ref 8–23)
CO2: 25 mmol/L (ref 22–32)
Calcium: 9.7 mg/dL (ref 8.9–10.3)
Chloride: 106 mmol/L (ref 98–111)
Creatinine: 0.65 mg/dL (ref 0.44–1.00)
GFR, Estimated: >60 mL/min (ref >60)
Glucose, Bld: 140 mg/dL — ABNORMAL HIGH (ref 70–99)
Potassium: 4 mmol/L (ref 3.5–5.1)
Sodium: 138 mmol/L (ref 135–145)
Total Bilirubin: 0.5 mg/dL (ref 0.3–1.2)
Total Protein: 6.5 g/dL (ref 6.5–8.1)

## 2022-07-05 MED ORDER — FAM-TRASTUZUMAB DERUXTECAN-NXKI CHEMO 100 MG IV SOLR
3.2000 mg/kg | Freq: Once | INTRAVENOUS | Status: AC
  Administered 2022-07-05: 300 mg via INTRAVENOUS
  Filled 2022-07-05: qty 15

## 2022-07-05 MED ORDER — DIPHENHYDRAMINE HCL 25 MG PO CAPS
25.0000 mg | ORAL_CAPSULE | Freq: Once | ORAL | Status: AC
  Administered 2022-07-05: 25 mg via ORAL
  Filled 2022-07-05: qty 1

## 2022-07-05 MED ORDER — SODIUM CHLORIDE 0.9% FLUSH
10.0000 mL | INTRAVENOUS | Status: DC | PRN
  Administered 2022-07-05: 10 mL

## 2022-07-05 MED ORDER — PALONOSETRON HCL INJECTION 0.25 MG/5ML
0.2500 mg | Freq: Once | INTRAVENOUS | Status: AC
  Administered 2022-07-05: 0.25 mg via INTRAVENOUS

## 2022-07-05 MED ORDER — HEPARIN SOD (PORK) LOCK FLUSH 100 UNIT/ML IV SOLN
500.0000 [IU] | Freq: Once | INTRAVENOUS | Status: AC | PRN
  Administered 2022-07-05: 500 [IU]

## 2022-07-05 MED ORDER — ACETAMINOPHEN 325 MG PO TABS
650.0000 mg | ORAL_TABLET | Freq: Once | ORAL | Status: AC
  Administered 2022-07-05: 650 mg via ORAL
  Filled 2022-07-05: qty 2

## 2022-07-05 MED ORDER — SODIUM CHLORIDE 0.9 % IV SOLN
150.0000 mg | Freq: Once | INTRAVENOUS | Status: AC
  Administered 2022-07-05: 150 mg via INTRAVENOUS
  Filled 2022-07-05: qty 150

## 2022-07-05 MED ORDER — DEXTROSE 5 % IV SOLN: Freq: Once | INTRAVENOUS | Status: AC

## 2022-07-05 MED ORDER — SEMAGLUTIDE(0.25 OR 0.5MG/DOS) 2 MG/3ML ~~LOC~~ SOPN
0.5000 mg | PEN_INJECTOR | SUBCUTANEOUS | 1 refills | Status: DC
  Filled 2022-07-05: qty 3, 28d supply, fill #0
  Filled 2022-08-15 (×2): qty 3, 28d supply, fill #1

## 2022-07-05 MED ORDER — SODIUM CHLORIDE 0.9 % IV SOLN
10.0000 mg | Freq: Once | INTRAVENOUS | Status: AC
  Administered 2022-07-05: 10 mg via INTRAVENOUS
  Filled 2022-07-05: qty 10

## 2022-07-05 NOTE — Assessment & Plan Note (Signed)
08/04/20: Right mastectomy (Dr. 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 Neoadjuvant chemotherapy with TCH Perjeta 6 cycles followed by Kadcyla maintenance discontinued 11/11/2020 because of peripheral neuropathy 2. Followed by mastectomy with sentinel lymph node study 08/04/20 3. Followed by adjuvant radiation therapy 09/18/2020- 11/04/2020 4.  Decided not to do antiestrogen therapy because her ER was 0% on final path. 5.  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 Bone scan 08/03/2021: No definite evidence of metastatic disease  Echocardiogram 07/22/2021: EF 60 to 65%    Current treatment: Cycle 15 Enhertu Enhertu toxicities:  1. Fatigue that lasted for 3-5 days 2. nausea: Much improved 3.  Insomnia 4.  Worsening neuropathy: stable 5.  Hair thinning 6.  Diabetes: Patient will be seeing a primary care physician for this  CT scan scheduled for 07/18/2022. If the scan looks good then we can talk about reducing the frequency of treatments. Return to clinic every 3 weeks for Enhertu.

## 2022-07-05 NOTE — Patient Instructions (Signed)
[facility]  Discharge Instructions: Thank you for choosing [facility] to provide your oncology and hematology care.   If you have a lab appointment with the Cancer Center, please go directly to the Cancer Center and check in at the registration area.   Wear comfortable clothing and clothing appropriate for easy access to any Portacath or PICC line.   We strive to give you quality time with your provider. You may need to reschedule your appointment if you arrive late (15 or more minutes).  Arriving late affects you and other patients whose appointments are after yours.  Also, if you miss three or more appointments without notifying the office, you may be dismissed from the clinic at the provider's discretion.      For prescription refill requests, have your pharmacy contact our office and allow 72 hours for refills to be completed.    Today you received the following chemotherapy and/or immunotherapy agents Enhertu.   To help prevent nausea and vomiting after your treatment, we encourage you to take your nausea medication as directed.  BELOW ARE SYMPTOMS THAT SHOULD BE REPORTED IMMEDIATELY: *FEVER GREATER THAN 100.4 F (38 C) OR HIGHER *CHILLS OR SWEATING *NAUSEA AND VOMITING THAT IS NOT CONTROLLED WITH YOUR NAUSEA MEDICATION *UNUSUAL SHORTNESS OF BREATH *UNUSUAL BRUISING OR BLEEDING *URINARY PROBLEMS (pain or burning when urinating, or frequent urination) *BOWEL PROBLEMS (unusual diarrhea, constipation, pain near the anus) TENDERNESS IN MOUTH AND THROAT WITH OR WITHOUT PRESENCE OF ULCERS (sore throat, sores in mouth, or a toothache) UNUSUAL RASH, SWELLING OR PAIN  UNUSUAL VAGINAL DISCHARGE OR ITCHING   Items with * indicate a potential emergency and should be followed up as soon as possible or go to the Emergency Department if any problems should occur.  Please show the CHEMOTHERAPY ALERT CARD or IMMUNOTHERAPY ALERT CARD at check-in  to the Emergency Department and triage nurse.  Should you have questions after your visit or need to cancel or reschedule your appointment, please contact [facility]  Dept: [phone]  and follow the prompts.  Office hours are 8:00 a.m. to 4:30 p.m. Monday - Friday. Please note that voicemails left after 4:00 p.m. may not be returned until the following business day.  We are closed weekends and major holidays. You have access to a nurse at all times for urgent questions. Please call the main number to the clinic Dept: [phone] and follow the prompts.   For any non-urgent questions, you may also contact your provider using MyChart. We now offer e-Visits for anyone 18 and older to request care online for non-urgent symptoms. For details visit [email or web address].   Also download the MyChart app! Go to the app store, search "MyChart", open the app, select [facility], and log in with your MyChart username and password.   

## 2022-07-06 ENCOUNTER — Other Ambulatory Visit: Payer: Self-pay

## 2022-07-13 NOTE — Progress Notes (Unsigned)
New Patient Office Visit  Subjective:   Sabrina Woods 12-Jul-1955 07/14/2022  No chief complaint on file.   HPI: [name] presents today to establish care at [facility]. Introduced to Publishing rights manager role and practice setting.  All questions answered.   Last PCP: *** Last annual physical: *** Concerns: See below    DIABETES MELLITUS: Sabrina Woods presents for the medical management of diabetes.  Current diabetes medication regimen: *** Patient is *** adhering to a diabetic diet.  Patient is *** exercising regularly.  Patient is *** checking BS regularly. Avg: *** Patient is *** checking their feet regularly.  Denies polydipsia, polyphagia, polyuria, open wounds or ulcers on feet.  Lab Results  Component Value Date   HGBA1C 8.5 (H) 01/19/2022    No foot exam found No results found for: "LABMICR", "MICROALBUR"  Wt Readings from Last 3 Encounters:  07/05/22 192 lb 3.2 oz (87.2 kg)  06/16/22 194 lb 4.8 oz (88.1 kg)  05/26/22 196 lb 14.4 oz (89.3 kg)    The following portions of the patient's history were reviewed and updated as appropriate: past medical history, past surgical history, family history, social history, allergies, medications, and problem list.   Patient Active Problem List   Diagnosis Date Noted   Essential (primary) hypertension 01/19/2022   Type 2 diabetes mellitus without complication, without long-term current use of insulin (HCC) 01/19/2022   Fatigue 04/08/2021   S/P mastectomy, right 08/04/2020   Chemotherapy-induced peripheral neuropathy (HCC) 05/20/2020   Malignant neoplasm of upper-outer quadrant of right breast in female, estrogen receptor positive (HCC) 03/10/2020   Fibroid    Smoker    Depression    Past Medical History:  Diagnosis Date   Acid reflux    Anxiety    Breast cancer (HCC)    Breast cancer (HCC) 2022   Depression    HISTORY OF   Fibroid    PONV  (postoperative nausea and vomiting)    Smoker    Past Surgical History:  Procedure Laterality Date   CESAREAN SECTION     X 2   MASTECTOMY     MASTECTOMY WITH AXILLARY LYMPH NODE DISSECTION Right 08/04/2020   Procedure: RIGHT MASTECTOMY WITH RIGHT AXILLARY LYMPH NODE DISSECTION;  Surgeon: [name], MD;  Location: [facility];  Service: General;  Laterality: Right;   PORTACATH PLACEMENT Left 03/17/2020   Procedure: INSERTION PORT-A-CATH;  Surgeon: [name], MD;  Location: [facility];  Service: General;  Laterality: Left;   PORTACATH PLACEMENT Left 08/05/2021   Procedure: PORT PLACEMENT WITH ULTRASOUND GUIDANCE;  Surgeon: [name], MD;  Location: [facility];  Service: General;  Laterality: Left;  LMA   Family History  Problem Relation Age of Onset   Breast cancer Mother    Diabetes Mother    Hypertension Mother    Hypertension Father    Hypertension Sister    Hypertension Brother    Social History   Socioeconomic History   Marital status: Married    Spouse name: Not on file   Number of children: Not on file   Years of education: Not on file   Highest education level: Not on file  Occupational History   Not on file  Tobacco Use   Smoking status: Every Day    Packs/day: .5    Types: Cigarettes   Smokeless tobacco: Never  Vaping Use   Vaping Use: Never used  Substance and  Sexual Activity   Alcohol use: Yes    Comment: occasionally   Drug use: Never   Sexual activity: Yes    Birth control/protection: Post-menopausal  Other Topics Concern   Not on file  Social History Narrative   Right handed    Caffeine- 5-6 cups per day    Social Determinants of Health   Financial Resource Strain: Low Risk  (09/01/2020)   Overall Financial Resource Strain (CARDIA)    Difficulty of Paying Living Expenses: Not hard at all  Food Insecurity: No Food Insecurity (09/01/2020)   Hunger Vital Sign    Worried About Running Out of Food in  the Last Year: Never true    Ran Out of Food in the Last Year: Never true  Transportation Needs: No Transportation Needs (09/01/2020)   PRAPARE - Administrator, Civil Service (Medical): No    Lack of Transportation (Non-Medical): No  Physical Activity: Not on file  Stress: Not on file  Social Connections: Not on file  Intimate Partner Violence: Not on file   Outpatient Medications Prior to Visit  Medication Sig Dispense Refill   ACCU-CHEK GUIDE test strip See admin instructions.     ALPRAZolam (XANAX) 0.25 MG tablet Take 1 tablet (0.25 mg total) by mouth 2 (two) times daily as needed for anxiety. 60 tablet 3   blood glucose meter kit and supplies KIT Dispense based on patient and insurance preference. Use up to four times daily as directed. 1 each 0   dapagliflozin propanediol (FARXIGA) 10 MG TABS tablet Take 1 tablet (10 mg total) by mouth daily. 90 tablet 3   esomeprazole (NEXIUM) 20 MG capsule Take 20 mg by mouth daily.     gabapentin (NEURONTIN) 600 MG tablet Take 1 tablet (600 mg total) by mouth 3 (three) times daily. 90 tablet 3   glipiZIDE (GLUCOTROL) 5 MG tablet Take 1 tablet (5 mg total) by mouth daily before breakfast. 30 tablet 1   lidocaine-prilocaine (EMLA) cream APPLY TO AFFECTED AREA ONCE AS DIRECTED     loratadine (CLARITIN) 10 MG tablet Take 10 mg by mouth daily as needed for rhinitis.     Multiple Vitamin (MULTIVITAMIN) capsule Take 1 capsule by mouth daily.     nicotine (NICODERM CQ - DOSED IN MG/24 HR) 7 mg/24hr patch PLACE 1 PATCH (7 MG TOTAL) ONTO THE SKIN DAILY.     ondansetron (ZOFRAN) 24 MG tablet Take 24 mg by mouth once.     prochlorperazine (COMPAZINE) 10 MG tablet Take 1 tablet (10 mg total) by mouth every 6 (six) hours as needed (Nausea or vomiting). 30 tablet 1   Semaglutide,0.25 or 0.5MG /DOS, 2 MG/3ML SOPN Inject 0.5 mg into the skin once a week 3 mL 1   UNABLE TO FIND Take by mouth in the morning, at noon, and at bedtime. palmitoylethanolamide  luteolin.     No facility-administered medications prior to visit.   No Known Allergies  ROS: A complete ROS was performed with pertinent positives/negatives noted in the HPI. The remainder of the ROS are negative.   Objective:   There were no vitals filed for this visit.  GENERAL: Well-appearing, in NAD. Well nourished.  SKIN: Pink, warm and dry. No rash, lesion, ulceration, or ecchymoses.  Head: Normocephalic. NECK: Trachea midline. Full ROM w/o pain or tenderness. No lymphadenopathy.  EARS: Tympanic membranes are intact, translucent without bulging and without drainage. Appropriate landmarks visualized.  EYES: Conjunctiva clear without exudates. EOMI, PERRL, no drainage present.  THROAT: Uvula  midline. Oropharynx clear. Tonsils non-inflamed without exudate. Mucous membranes pink and moist.  RESPIRATORY: Chest wall symmetrical. Respirations even and non-labored. Breath sounds clear to auscultation bilaterally.  CARDIAC: S1, S2 present, regular rate and rhythm without murmur or gallops. Peripheral pulses 2+ bilaterally.  MSK: Muscle tone and strength appropriate for age. Joints w/o tenderness, redness, or swelling.  EXTREMITIES: Without clubbing, cyanosis, or edema.  NEUROLOGIC: No motor or sensory deficits. Steady, even gait. C2-C12 intact.  PSYCH/MENTAL STATUS: Alert, oriented x 3. Cooperative, appropriate mood and affect.    Health Maintenance Due  Topic Date Due   Medicare Annual Wellness (AWV)  Never done   FOOT EXAM  Never done   OPHTHALMOLOGY EXAM  Never done   Diabetic kidney evaluation - Urine ACR  Never done   Hepatitis C Screening  Never done   Zoster Vaccines- Shingrix (1 of 2) Never done   Colonoscopy  Never done   PAP SMEAR-Modifier  02/03/2021   COVID-19 Vaccine (4 - 2023-24 season) 09/03/2021    No results found for any visits on 07/14/22.     Assessment & Plan:  There are no diagnoses linked to this encounter.    Patient to reach out to office if new,  worrisome, or unresolved symptoms arise or if no improvement in patient's condition. Patient verbalized understanding and is agreeable to treatment plan. All questions answered to patient's satisfaction.    No follow-ups on file.   Of note, portions of this note may have been created with voice recognition software Physicist, medical). While this note has been edited for accuracy, occasional wrong-word or 'sound-a-like' substitutions may have occurred due to the inherent limitations of voice recognition software.  Yolanda Manges, FNP

## 2022-07-14 ENCOUNTER — Encounter: Payer: Self-pay | Admitting: Hematology and Oncology

## 2022-07-14 ENCOUNTER — Telehealth (HOSPITAL_BASED_OUTPATIENT_CLINIC_OR_DEPARTMENT_OTHER): Payer: Self-pay | Admitting: Family Medicine

## 2022-07-14 ENCOUNTER — Ambulatory Visit (INDEPENDENT_AMBULATORY_CARE_PROVIDER_SITE_OTHER): Payer: Medicare Other | Admitting: Family Medicine

## 2022-07-14 ENCOUNTER — Other Ambulatory Visit (HOSPITAL_BASED_OUTPATIENT_CLINIC_OR_DEPARTMENT_OTHER): Payer: Self-pay

## 2022-07-14 ENCOUNTER — Encounter (HOSPITAL_BASED_OUTPATIENT_CLINIC_OR_DEPARTMENT_OTHER): Payer: Self-pay | Admitting: Family Medicine

## 2022-07-14 VITALS — BP 139/81 | HR 88 | Ht 66.0 in | Wt 186.0 lb

## 2022-07-14 DIAGNOSIS — Z7689 Persons encountering health services in other specified circumstances: Secondary | ICD-10-CM

## 2022-07-14 DIAGNOSIS — C50411 Malignant neoplasm of upper-outer quadrant of right female breast: Secondary | ICD-10-CM

## 2022-07-14 DIAGNOSIS — E119 Type 2 diabetes mellitus without complications: Secondary | ICD-10-CM

## 2022-07-14 DIAGNOSIS — Z716 Tobacco abuse counseling: Secondary | ICD-10-CM

## 2022-07-14 DIAGNOSIS — Z1211 Encounter for screening for malignant neoplasm of colon: Secondary | ICD-10-CM

## 2022-07-14 MED ORDER — NICOTINE 14 MG/24HR TD PT24
14.0000 mg | MEDICATED_PATCH | Freq: Every day | TRANSDERMAL | 0 refills | Status: DC
Start: 2022-07-14 — End: 2022-08-24
  Filled 2022-07-14: qty 28, 28d supply, fill #0

## 2022-07-14 NOTE — Assessment & Plan Note (Addendum)
Will obtain lab work today for upcoming visit.  Discussed preventative screening exams with patient and will obtain Pap with [facility] per patient preference

## 2022-07-14 NOTE — Assessment & Plan Note (Signed)
Last A1c 8.5 in January 2024.  Will repeat this with lab work today and adjust Ozempic as needed.  Foot exam and urine albumin completed in office today.  Referral placed to ophthalmology for diabetic eye exam.

## 2022-07-14 NOTE — Telephone Encounter (Signed)
Pt came in today 7/11 for a new pt appt. Labs were ordered as well as a urine microalbumin test. Looking at pt's chart and saw that the urine microalbumin had not been performed and when looking at it, saw that it was put in differently to where lab would not see that it needed to be collected.   Attempted to call pt about this to see if we could get her to come back up to lab at her convenience to do this urine test but unable to reach. Left a detailed message for pt to return call so we can discuss this with her.  I have reordered the urine microalbumin and placed it as future so we can release it when pt does come back to the lab. Will await a return call from pt.

## 2022-07-14 NOTE — Addendum Note (Signed)
Addended by: Wyvonne Lenz on: 07/14/2022 03:42 PM   Modules accepted: Orders

## 2022-07-14 NOTE — Patient Instructions (Signed)
CONSTIPATION: Dietary changes (more leafy greens, vegetables and fruits; less processed foods) Drink 64 oz of water a day. Ensure regular exercise.    Fiber supplementation (Benefiber, FiberCon, Metamucil, or Psyllium). Start slow and increase gradually to full dose.  Add a probiotic (such as Florastor) daily. Over-the-counter stool softeners such as: Docusate or Colace  If you are not able to have regular BM's with the above regimen, you may add miralax 1 tablespoon daily.  Increase or decrease amount/frequency as needed to ensure 1 soft BM/day.   Natural approach for constipation:  "Power Pudding" is a natural mixture that may help your constipation.  To make blend 1 cup applesauce, 1 cup wheat bran, and 3/4 cup prune juice, refrigerate and then take 1 tablespoon daily with a large glass of water as needed.   BOWEL REGIMEN FOR CONSTIPATION  Begin Miralax 1 & 1/2 capfuls three times daily (for example: 8am, 12pm, and 4pm) for two days. Decrease to 1 capful daily after this. Begin Senekot S (2 tablets twice daily) starting on day 2 of using Miralax. Stools should reach consistency of toothpaste. Once this has been achieved, use the Senakot or Colace as needed and begin Colace 100mg  twice daily. Maintenance regimen is using Miralax 1 capful daily and Colace 100mg  twice daily.   2. Increase liquids to at least 6-8 cups of water per day.   3. Increase dietary fiber including raw fruits, vegetables, high fiber grains and breads.   4. Decrease intake of foods that may cause constipation such as dairy (milk, cheeses), processed meats, and white bread.   5. Exercise daily. This can include walking for at least 30 minutes daily.   6. Reserve enough time for bowel movements so you are not rushed. Do no ignore the urge to have a bowel movement.

## 2022-07-14 NOTE — Assessment & Plan Note (Signed)
Patient to use 14 mg nicotine patches daily.  Will trial this for the next 2 to 3 weeks and attempt to decrease to 7 mg patches.  Patient to continue cessation of cigarette smoking.  Discussed potential side effects and symptoms of this medication and safe use and she verbalized understanding.

## 2022-07-14 NOTE — Assessment & Plan Note (Signed)
Managed by Dr Pamelia Hoit.  CBC and CMP reviewed from most recent infusion in July 2024.  Discussed methods to prevent constipation from chemotherapy including high-fiber diet, increasing fruits and vegetable vegetables, hydration, and use of Colace or senna as needed prior to chemotherapy infusion.

## 2022-07-14 NOTE — Assessment & Plan Note (Signed)
Patient declined colonoscopy.  Patient was agreeable to Cologuard and this was ordered to exact sciences Patient declines cscope.   We discussed the risk of this means means there could be underlying issue or malignancy not identified, increased morbidity, and even mortality.   Patient verbalized understanding and acceptance of risk.   Patient knows they can change their mind at any time and we will be happy to coordinate these things for them.

## 2022-07-15 LAB — LIPID PANEL
Chol/HDL Ratio: 3.1 ratio (ref 0.0–4.4)
Cholesterol, Total: 176 mg/dL (ref 100–199)
HDL: 57 mg/dL (ref >39)
LDL Chol Calc (NIH): 102 mg/dL — ABNORMAL HIGH (ref 0–99)
Triglycerides: 95 mg/dL (ref 0–149)
VLDL Cholesterol Cal: 17 mg/dL (ref 5–40)

## 2022-07-15 LAB — HEMOGLOBIN A1C
Est. average glucose Bld gHb Est-mCnc: 200 mg/dL
Hgb A1c MFr Bld: 8.6 % — ABNORMAL HIGH (ref 4.8–5.6)

## 2022-07-15 LAB — TSH: TSH: 2.16 u[IU]/mL (ref 0.450–4.500)

## 2022-07-18 ENCOUNTER — Ambulatory Visit (HOSPITAL_COMMUNITY)
Admission: RE | Admit: 2022-07-18 | Discharge: 2022-07-18 | Disposition: A | Discharge status: Home / Self Care | Payer: Medicare Other | Source: Ambulatory Visit | Attending: Adult Health | Admitting: Adult Health

## 2022-07-18 DIAGNOSIS — Z170 Estrogen receptor positive status [ER+]: Secondary | ICD-10-CM | POA: Insufficient documentation

## 2022-07-18 DIAGNOSIS — C50411 Malignant neoplasm of upper-outer quadrant of right female breast: Secondary | ICD-10-CM | POA: Insufficient documentation

## 2022-07-18 MED ORDER — HEPARIN SOD (PORK) LOCK FLUSH 100 UNIT/ML IV SOLN
INTRAVENOUS | Status: AC
  Filled 2022-07-18: qty 5

## 2022-07-18 MED ORDER — IOHEXOL 300 MG/ML  SOLN
75.0000 mL | Freq: Once | INTRAMUSCULAR | Status: AC | PRN
  Administered 2022-07-18: 75 mL via INTRAVENOUS

## 2022-07-18 MED ORDER — HEPARIN SOD (PORK) LOCK FLUSH 100 UNIT/ML IV SOLN
500.0000 [IU] | Freq: Once | INTRAVENOUS | Status: AC
  Administered 2022-07-18: 500 [IU] via INTRAVENOUS

## 2022-07-18 NOTE — Telephone Encounter (Signed)
Have sent pt a mychart message about the urine that is needing to be collected. Will await a response.

## 2022-07-18 NOTE — Progress Notes (Signed)
Your cholesterol is excellent. Your TSH (thyroid hormone) is normal. With a recent increase in your Ozempic to 0.5 weekly, I would recommend we stay at 0.5mg  dosage for the next 3 months and repeat your labs at that time. Please let me know if you have further questions.

## 2022-07-18 NOTE — Telephone Encounter (Signed)
Pt did review mychart message that was sent.

## 2022-07-25 ENCOUNTER — Ambulatory Visit: Payer: Medicare Other | Admitting: Internal Medicine

## 2022-07-25 ENCOUNTER — Ambulatory Visit: Payer: Medicare Other | Attending: Adult Health | Admitting: Rehabilitation

## 2022-07-25 DIAGNOSIS — Z170 Estrogen receptor positive status [ER+]: Secondary | ICD-10-CM | POA: Insufficient documentation

## 2022-07-25 DIAGNOSIS — Z483 Aftercare following surgery for neoplasm: Secondary | ICD-10-CM | POA: Insufficient documentation

## 2022-07-25 DIAGNOSIS — C50411 Malignant neoplasm of upper-outer quadrant of right female breast: Secondary | ICD-10-CM | POA: Insufficient documentation

## 2022-07-25 NOTE — Therapy (Signed)
OUTPATIENT PHYSICAL THERAPY SOZO SCREENING NOTE   Patient Name: Sabrina Woods MRN: [id number] DOB:03-May-1955, 67 y.o., female Today's Date: 07/25/2022  PCP: Hilbert Bible, FNP REFERRING PROVIDER: Lillard Anes Cornett*   PT End of Session - 07/25/22 1615     Visit Number 13   screen   PT Start Time 1612    PT Stop Time 1616    PT Time Calculation (min) 4 min    Activity Tolerance Patient tolerated treatment well    Behavior During Therapy [facility] for tasks assessed/performed             Past Medical History:  Diagnosis Date   Acid reflux    Anxiety    Breast cancer (HCC)    Breast cancer (HCC) 2022   Depression    HISTORY OF   Fibroid    PONV (postoperative nausea and vomiting)    Smoker    Past Surgical History:  Procedure Laterality Date   CESAREAN SECTION     X 2   MASTECTOMY     MASTECTOMY WITH AXILLARY LYMPH NODE DISSECTION Right 08/04/2020   Procedure: RIGHT MASTECTOMY WITH RIGHT AXILLARY LYMPH NODE DISSECTION;  Surgeon: [name], MD;  Location: [facility] OR;  Service: General;  Laterality: Right;   PORTACATH PLACEMENT Left 03/17/2020   Procedure: INSERTION PORT-A-CATH;  Surgeon: [name], MD;  Location: [facility];  Service: General;  Laterality: Left;   PORTACATH PLACEMENT Left 08/05/2021   Procedure: PORT PLACEMENT WITH ULTRASOUND GUIDANCE;  Surgeon: [name], MD;  Location: [facility];  Service: General;  Laterality: Left;  LMA   Patient Active Problem List   Diagnosis Date Noted   Encounter to establish care with new doctor 07/14/2022   Screening for colon cancer 07/14/2022   Encounter for smoking cessation counseling 07/14/2022   Essential (primary) hypertension 01/19/2022   Type 2 diabetes mellitus without complication, without long-term current use of insulin (HCC) 01/19/2022   Fatigue 04/08/2021   S/P mastectomy, right 08/04/2020   Chemotherapy-induced peripheral neuropathy (HCC)  05/20/2020   Malignant neoplasm of upper-outer quadrant of right breast in female, estrogen receptor positive (HCC) 03/10/2020   Fibroid    Smoker    Depression     REFERRING DIAG: right breast cancer at risk for lymphedema  THERAPY DIAG:  Aftercare following surgery for neoplasm  Malignant neoplasm of upper-outer quadrant of right breast in female, estrogen receptor positive (HCC)  PERTINENT HISTORY: R breast cancer s/p R mastectomy on 08/04/20 with 2 R positive axillary nodes, high grade DCIS. Has completed chemo as of Oct 2022 and radiation 11/04/20  PRECAUTIONS: right UE Lymphedema risk, None  SUBJECTIVE: Pt returns for her 3 month L-Dex screen.   PAIN:  Are you having pain? No  SOZO SCREENING: Patient was assessed today using the SOZO machine to determine the lymphedema index score. This was compared to her baseline score. It was determined that she is within the recommended range when compared to her baseline and no further action is needed at this time. She will continue SOZO screenings. These are done every 3 months for 2 years post operatively followed by every 6 months for 2 years, and then annually.   L-DEX FLOWSHEETS - 07/25/22 1600       L-DEX LYMPHEDEMA SCREENING   Measurement Type Unilateral    L-DEX MEASUREMENT EXTREMITY Upper Extremity    POSITION  Standing    DOMINANT SIDE Right    At Risk Side Right  BASELINE SCORE (UNILATERAL) -3.5    L-DEX SCORE (UNILATERAL) 1.7    VALUE CHANGE (UNILAT) 5.2               Idamae Lusher, PT 07/25/2022, 4:16 PM

## 2022-07-26 MED FILL — Fosaprepitant Dimeglumine For IV Infusion 150 MG (Base Eq): INTRAVENOUS | Qty: 5 | Status: AC

## 2022-07-26 MED FILL — Dexamethasone Sodium Phosphate Inj 100 MG/10ML: INTRAMUSCULAR | Qty: 1 | Status: AC

## 2022-07-27 ENCOUNTER — Inpatient Hospital Stay: Payer: Medicare Other

## 2022-07-27 ENCOUNTER — Telehealth: Payer: Self-pay | Admitting: Hematology and Oncology

## 2022-07-27 ENCOUNTER — Other Ambulatory Visit: Payer: Self-pay

## 2022-07-27 ENCOUNTER — Other Ambulatory Visit: Payer: Self-pay | Admitting: Hematology and Oncology

## 2022-07-27 ENCOUNTER — Inpatient Hospital Stay (HOSPITAL_BASED_OUTPATIENT_CLINIC_OR_DEPARTMENT_OTHER): Payer: Medicare Other | Admitting: Adult Health

## 2022-07-27 ENCOUNTER — Encounter: Payer: Self-pay | Admitting: Adult Health

## 2022-07-27 DIAGNOSIS — Z170 Estrogen receptor positive status [ER+]: Secondary | ICD-10-CM | POA: Diagnosis not present

## 2022-07-27 DIAGNOSIS — R5383 Other fatigue: Secondary | ICD-10-CM

## 2022-07-27 DIAGNOSIS — C50411 Malignant neoplasm of upper-outer quadrant of right female breast: Secondary | ICD-10-CM

## 2022-07-27 DIAGNOSIS — T50915S Adverse effect of multiple unspecified drugs, medicaments and biological substances, sequela: Secondary | ICD-10-CM

## 2022-07-27 DIAGNOSIS — Z5112 Encounter for antineoplastic immunotherapy: Secondary | ICD-10-CM | POA: Diagnosis not present

## 2022-07-27 LAB — CBC WITH DIFFERENTIAL (CANCER CENTER ONLY)
Abs Immature Granulocytes: 0.01 10*3/uL (ref 0.00–0.07)
Basophils Absolute: 0.1 10*3/uL (ref 0.0–0.1)
Basophils Relative: 2 %
Eosinophils Absolute: 0.1 10*3/uL (ref 0.0–0.5)
Eosinophils Relative: 3 %
HCT: 38.3 % (ref 36.0–46.0)
Hemoglobin: 13.2 g/dL (ref 12.0–15.0)
Immature Granulocytes: 0 %
Lymphocytes Relative: 23 %
Lymphs Abs: 1.1 10*3/uL (ref 0.7–4.0)
MCH: 32.8 pg (ref 26.0–34.0)
MCHC: 34.5 g/dL (ref 30.0–36.0)
MCV: 95 fL (ref 80.0–100.0)
Monocytes Absolute: 0.5 10*3/uL (ref 0.1–1.0)
Monocytes Relative: 10 %
Neutro Abs: 3 10*3/uL (ref 1.7–7.7)
Neutrophils Relative %: 62 %
Platelet Count: 165 10*3/uL (ref 150–400)
RBC: 4.03 MIL/uL (ref 3.87–5.11)
RDW: 13.6 % (ref 11.5–15.5)
WBC Count: 4.8 10*3/uL (ref 4.0–10.5)
nRBC: 0 % (ref 0.0–0.2)

## 2022-07-27 LAB — CMP (CANCER CENTER ONLY)
ALT: 33 U/L (ref 0–44)
AST: 36 U/L (ref 15–41)
Albumin: 3.8 g/dL (ref 3.5–5.0)
Alkaline Phosphatase: 121 U/L (ref 38–126)
Anion gap: 6 (ref 5–15)
BUN: 6 mg/dL — ABNORMAL LOW (ref 8–23)
CO2: 26 mmol/L (ref 22–32)
Calcium: 9.4 mg/dL (ref 8.9–10.3)
Chloride: 104 mmol/L (ref 98–111)
Creatinine: 0.61 mg/dL (ref 0.44–1.00)
GFR, Estimated: >60 mL/min (ref >60)
Glucose, Bld: 130 mg/dL — ABNORMAL HIGH (ref 70–99)
Potassium: 3.8 mmol/L (ref 3.5–5.1)
Sodium: 136 mmol/L (ref 135–145)
Total Bilirubin: 0.4 mg/dL (ref 0.3–1.2)
Total Protein: 6.4 g/dL — ABNORMAL LOW (ref 6.5–8.1)

## 2022-07-27 MED ORDER — SODIUM CHLORIDE 0.9% FLUSH
10.0000 mL | INTRAVENOUS | Status: DC | PRN
  Administered 2022-07-27: 10 mL

## 2022-07-27 MED ORDER — SODIUM CHLORIDE 0.9 % IV SOLN
10.0000 mg | Freq: Once | INTRAVENOUS | Status: AC
  Administered 2022-07-27: 10 mg via INTRAVENOUS
  Filled 2022-07-27: qty 10

## 2022-07-27 MED ORDER — FAM-TRASTUZUMAB DERUXTECAN-NXKI CHEMO 100 MG IV SOLR
3.2000 mg/kg | Freq: Once | INTRAVENOUS | Status: AC
  Administered 2022-07-27: 300 mg via INTRAVENOUS
  Filled 2022-07-27: qty 15

## 2022-07-27 MED ORDER — DEXTROSE 5 % IV SOLN: Freq: Once | INTRAVENOUS | Status: AC

## 2022-07-27 MED ORDER — DIPHENHYDRAMINE HCL 25 MG PO CAPS
25.0000 mg | ORAL_CAPSULE | Freq: Once | ORAL | Status: AC
  Administered 2022-07-27: 25 mg via ORAL
  Filled 2022-07-27: qty 1

## 2022-07-27 MED ORDER — PALONOSETRON HCL INJECTION 0.25 MG/5ML
0.2500 mg | Freq: Once | INTRAVENOUS | Status: AC
  Administered 2022-07-27: 0.25 mg via INTRAVENOUS
  Filled 2022-07-27: qty 5

## 2022-07-27 MED ORDER — SODIUM CHLORIDE 0.9 % IV SOLN
150.0000 mg | Freq: Once | INTRAVENOUS | Status: AC
  Administered 2022-07-27: 150 mg via INTRAVENOUS
  Filled 2022-07-27: qty 150

## 2022-07-27 MED ORDER — SODIUM CHLORIDE 0.9% FLUSH
10.0000 mL | Freq: Once | INTRAVENOUS | Status: AC
  Administered 2022-07-27: 10 mL

## 2022-07-27 MED ORDER — ACETAMINOPHEN 325 MG PO TABS
650.0000 mg | ORAL_TABLET | Freq: Once | ORAL | Status: AC
  Administered 2022-07-27: 650 mg via ORAL
  Filled 2022-07-27: qty 2

## 2022-07-27 MED ORDER — HEPARIN SOD (PORK) LOCK FLUSH 100 UNIT/ML IV SOLN
500.0000 [IU] | Freq: Once | INTRAVENOUS | Status: AC | PRN
  Administered 2022-07-27: 500 [IU]

## 2022-07-27 NOTE — Patient Instructions (Signed)
[facility]  Discharge Instructions: Thank you for choosing [facility] to provide your oncology and hematology care.   If you have a lab appointment with the Cancer Center, please go directly to the Cancer Center and check in at the registration area.   Wear comfortable clothing and clothing appropriate for easy access to any Portacath or PICC line.   We strive to give you quality time with your provider. You may need to reschedule your appointment if you arrive late (15 or more minutes).  Arriving late affects you and other patients whose appointments are after yours.  Also, if you miss three or more appointments without notifying the office, you may be dismissed from the clinic at the provider's discretion.      For prescription refill requests, have your pharmacy contact our office and allow 72 hours for refills to be completed.    Today you received the following chemotherapy and/or immunotherapy agents Enhertu.   To help prevent nausea and vomiting after your treatment, we encourage you to take your nausea medication as directed.  BELOW ARE SYMPTOMS THAT SHOULD BE REPORTED IMMEDIATELY: *FEVER GREATER THAN 100.4 F (38 C) OR HIGHER *CHILLS OR SWEATING *NAUSEA AND VOMITING THAT IS NOT CONTROLLED WITH YOUR NAUSEA MEDICATION *UNUSUAL SHORTNESS OF BREATH *UNUSUAL BRUISING OR BLEEDING *URINARY PROBLEMS (pain or burning when urinating, or frequent urination) *BOWEL PROBLEMS (unusual diarrhea, constipation, pain near the anus) TENDERNESS IN MOUTH AND THROAT WITH OR WITHOUT PRESENCE OF ULCERS (sore throat, sores in mouth, or a toothache) UNUSUAL RASH, SWELLING OR PAIN  UNUSUAL VAGINAL DISCHARGE OR ITCHING   Items with * indicate a potential emergency and should be followed up as soon as possible or go to the Emergency Department if any problems should occur.  Please show the CHEMOTHERAPY ALERT CARD or IMMUNOTHERAPY ALERT CARD at check-in  to the Emergency Department and triage nurse.  Should you have questions after your visit or need to cancel or reschedule your appointment, please contact [facility]  Dept: [phone]  and follow the prompts.  Office hours are 8:00 a.m. to 4:30 p.m. Monday - Friday. Please note that voicemails left after 4:00 p.m. may not be returned until the following business day.  We are closed weekends and major holidays. You have access to a nurse at all times for urgent questions. Please call the main number to the clinic Dept: [phone] and follow the prompts.   For any non-urgent questions, you may also contact your provider using MyChart. We now offer e-Visits for anyone 18 and older to request care online for non-urgent symptoms. For details visit [email or web address].   Also download the MyChart app! Go to the app store, search "MyChart", open the app, select [facility], and log in with your MyChart username and password.   

## 2022-07-27 NOTE — Telephone Encounter (Signed)
Scheduled appointments per WQ. Patient is aware of the made appointments.  

## 2022-07-27 NOTE — Assessment & Plan Note (Signed)
08/04/20: Right mastectomy (Dr. 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 Neoadjuvant chemotherapy with TCH Perjeta 6 cycles followed by Kadcyla maintenance discontinued 11/11/2020 because of peripheral neuropathy 2. Followed by mastectomy with sentinel lymph node study 08/04/20 3. Followed by adjuvant radiation therapy 09/18/2020- 11/04/2020 4.  Decided not to do antiestrogen therapy because her ER was 0% on final path. 5.  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 Bone scan 08/03/2021: No definite evidence of metastatic disease  CT chest on July 21, 2022 demonstrating no evidence of metastatic disease progression   Current treatment: Enhertu Enhertu toxicities:  1. Fatigue: Managed with energy conservation 2.  Constipation: Managed with over-the-counter regimen.  Resolves a few days after therapy 3. Diabetes: On Ozempic.  Sugars are improved.   I reviewed her labs with her which are good today.  She will proceed with treatment.  We will see her back in 4 weeks for labs, follow-up, and her next infusion.  I did place orders for an echocardiogram today.

## 2022-07-27 NOTE — Progress Notes (Signed)
[facility] Cancer Follow up:    Sabrina Bible, FNP [address] [place] [address]   DIAGNOSIS:  Cancer Staging  Malignant neoplasm of upper-outer quadrant of right breast in female, estrogen receptor positive (HCC) Staging form: Breast, AJCC 8th Edition - Clinical stage from 03/10/2020: Stage IIIA (cT3, cN1, cM0, G3, ER+, PR-, HER2+) - Signed by [name], MD on 03/10/2020 Stage prefix: Initial diagnosis - Pathologic stage from 08/04/2020: No Stage Recommended (ypTis (DCIS), pN2, cM0) - Signed by Loa Socks, NP on 02/05/2021 Stage prefix: Post-therapy   SUMMARY OF ONCOLOGIC HISTORY: Oncology History  Malignant neoplasm of upper-outer quadrant of right breast in female, estrogen receptor positive (HCC)  03/10/2020 Initial Diagnosis   Palpable right breast mass x6 months.  Mammogram revealed left breast cysts (intraductal papilloma), 11 cm right breast mass and 2 masses 2.6 cm each in the right axilla: Biopsy grade 3 IDC ER 5% weak, PR 0%, Ki-67 25%, HER-2 3+ IHC positive   03/10/2020 Cancer Staging   Staging form: Breast, AJCC 8th Edition - Clinical stage from 03/10/2020: Stage IIIA (cT3, cN1, cM0, G3, ER+, PR-, HER2+) - Signed by [name], MD on 03/10/2020 Stage prefix: Initial diagnosis   03/19/2020 - 07/01/2020 Chemotherapy   TCH Perjeta   08/04/2020 Surgery   Right mastectomy (Dr. 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, high-grade DCIS with necrosis and calcifications, and 2 right axillary lymph nodes positive for metastatic carcinoma   08/04/2020 Cancer Staging   Staging form: Breast, AJCC 8th Edition - Pathologic stage from 08/04/2020: No Stage Recommended (ypTis (DCIS), pN2, cM0) - Signed by Loa Socks, NP on 02/05/2021 Stage prefix: Post-therapy   08/21/2020 - 10/23/2020 Chemotherapy   Discontinued due to peripheral neuropathy Patient is on Treatment Plan :  BREAST ADO-Trastuzumab Emtansine (Kadcyla) q21d      08/2020 -  Anti-estrogen oral therapy    Decided not to do antiestrogen therapy because her ER was 0% on final path.   10/06/2020 - 11/04/2020 Radiation Therapy   Adjuvant radiation    Relapse/Recurrence   Right mastectomy area skin changes: Initially treated for shingles but since lesions did not improve skin biopsy was obtained on 07/14/2021: Infiltrating carcinoma consistent with breast origin CK7 and GA TA 3 positive, ER 0%, PR 0%, HER2 2+ by IHC, FISH negative ratio 1.56, copy #2.1   07/26/2021 -  Chemotherapy   Patient is on Treatment Plan : BREAST METASTATIC Fam-Trastuzumab Deruxtecan-nxki (Enhertu) (5.4) q21d     01/14/2022 Imaging   CT Chest: Slight decrease in size of the right lower lobe pulmonary nodule. Stable tiny non solid nodule in the superior segment of left lower lobe. No new pulmonary nodules. Stable surgical changes from right mastectomy and axillary lymph node dissection. No new developing lymph node enlargement or other mass lesion.  Chest port. Fatty liver infiltration. Aortic Atherosclerosis (ICD10-I70.0).     CURRENT THERAPY:Enhertu  INTERVAL HISTORY: Sabrina Woods 67 y.o. female returns for follow-up prior to receiving Enhertu.  She is tolerating treatment moderately well.  She does note some bowel changes with constipation the second and third day after she receives her treatment but then it resolves.  She denies any new issues today.  She recently underwent a CT chest on July 21, 2022 that demonstrated no evidence of metastatic disease progression.  She endorses mild fatigue.   Patient Active Problem List   Diagnosis Date Noted   Encounter to establish  care with new doctor 07/14/2022   Screening for colon cancer 07/14/2022   Encounter for smoking cessation counseling 07/14/2022   Essential (primary) hypertension 01/19/2022   Type 2 diabetes mellitus without complication, without long-term current use of  insulin (HCC) 01/19/2022   Fatigue 04/08/2021   S/P mastectomy, right 08/04/2020   Chemotherapy-induced peripheral neuropathy (HCC) 05/20/2020   Malignant neoplasm of upper-outer quadrant of right breast in female, estrogen receptor positive (HCC) 03/10/2020   Fibroid    Smoker    Depression     has No Known Allergies.  MEDICAL HISTORY: Past Medical History:  Diagnosis Date   Acid reflux    Anxiety    Breast cancer (HCC)    Breast cancer (HCC) 2022   Depression    HISTORY OF   Fibroid    PONV (postoperative nausea and vomiting)    Smoker     SURGICAL HISTORY: Past Surgical History:  Procedure Laterality Date   CESAREAN SECTION     X 2   MASTECTOMY     MASTECTOMY WITH AXILLARY LYMPH NODE DISSECTION Right 08/04/2020   Procedure: RIGHT MASTECTOMY WITH RIGHT AXILLARY LYMPH NODE DISSECTION;  Surgeon: [name], MD;  Location: [facility] OR;  Service: General;  Laterality: Right;   PORTACATH PLACEMENT Left 03/17/2020   Procedure: INSERTION PORT-A-CATH;  Surgeon: [name], MD;  Location: [facility];  Service: General;  Laterality: Left;   PORTACATH PLACEMENT Left 08/05/2021   Procedure: PORT PLACEMENT WITH ULTRASOUND GUIDANCE;  Surgeon: [name], MD;  Location: [facility];  Service: General;  Laterality: Left;  LMA    SOCIAL HISTORY: Social History   Socioeconomic History   Marital status: Married    Spouse name: Not on file   Number of children: Not on file   Years of education: Not on file   Highest education level: Not on file  Occupational History   Not on file  Tobacco Use   Smoking status: Every Day    Current packs/day: 1.00    Average packs/day: 1 pack/day for 49.6 years (49.6 ttl pk-yrs)    Types: Cigarettes    Start date: 1975   Smokeless tobacco: Never   Tobacco comments:    Using nicotine patch; currently smoking 2-3 cigs per day as of 07/14/22 ep  Vaping Use   Vaping status: Never Used  Substance and  Sexual Activity   Alcohol use: Not Currently   Drug use: Never   Sexual activity: Yes    Birth control/protection: Post-menopausal  Other Topics Concern   Not on file  Social History Narrative   Right handed    Caffeine- 5-6 cups per day    Social Determinants of Health   Financial Resource Strain: Low Risk  (09/01/2020)   Overall Financial Resource Strain (CARDIA)    Difficulty of Paying Living Expenses: Not hard at all  Food Insecurity: No Food Insecurity (09/01/2020)   Hunger Vital Sign    Worried About Running Out of Food in the Last Year: Never true    Ran Out of Food in the Last Year: Never true  Transportation Needs: No Transportation Needs (09/01/2020)   PRAPARE - Administrator, Civil Service (Medical): No    Lack of Transportation (Non-Medical): No  Physical Activity: Not on file  Stress: Not on file  Social Connections: Not on file  Intimate Partner Violence: Not on file    FAMILY HISTORY: Family History  Problem Relation Age of Onset   Cancer Mother  Breast cancer Mother    Diabetes Mother    Hypertension Mother    Hypertension Father    Hypertension Sister    Hypertension Brother     Review of Systems  Constitutional:  Positive for fatigue. Negative for appetite change, chills, fever and unexpected weight change.  HENT:   Negative for hearing loss, lump/mass and trouble swallowing.   Eyes:  Negative for eye problems and icterus.  Respiratory:  Negative for chest tightness, cough and shortness of breath.   Cardiovascular:  Negative for chest pain, leg swelling and palpitations.  Gastrointestinal:  Negative for abdominal distention, abdominal pain, constipation, diarrhea, nausea and vomiting.  Endocrine: Negative for hot flashes.  Genitourinary:  Negative for difficulty urinating.   Musculoskeletal:  Negative for arthralgias.  Skin:  Negative for itching and rash.  Neurological:  Negative for dizziness, extremity weakness, headaches and  numbness.  Hematological:  Negative for adenopathy. Does not bruise/bleed easily.  Psychiatric/Behavioral:  Negative for depression. The patient is not nervous/anxious.       PHYSICAL EXAMINATION    Vitals:   07/27/22 0906  BP: (!) 142/72  Pulse: 80  Resp: 18  Temp: (!) 97.3 F (36.3 C)  SpO2: 97%    Physical Exam Constitutional:      General: She is not in acute distress.    Appearance: Normal appearance. She is not toxic-appearing.  HENT:     Head: Normocephalic and atraumatic.     Mouth/Throat:     Mouth: Mucous membranes are moist.     Pharynx: Oropharynx is clear. No oropharyngeal exudate or posterior oropharyngeal erythema.  Eyes:     General: No scleral icterus. Cardiovascular:     Rate and Rhythm: Normal rate and regular rhythm.     Pulses: Normal pulses.     Heart sounds: Normal heart sounds.  Pulmonary:     Effort: Pulmonary effort is normal.     Breath sounds: Normal breath sounds.  Abdominal:     General: Abdomen is flat. Bowel sounds are normal. There is no distension.     Palpations: Abdomen is soft.     Tenderness: There is no abdominal tenderness.  Musculoskeletal:        General: No swelling.     Cervical back: Neck supple.  Lymphadenopathy:     Cervical: No cervical adenopathy.  Skin:    General: Skin is warm and dry.     Findings: No rash.  Neurological:     General: No focal deficit present.     Mental Status: She is alert.  Psychiatric:        Mood and Affect: Mood normal.        Behavior: Behavior normal.     LABORATORY DATA:  CBC    Component Value Date/Time   WBC 4.8 07/27/2022 0831   WBC 6.8 08/18/2021 0934   RBC 4.03 07/27/2022 0831   HGB 13.2 07/27/2022 0831   HCT 38.3 07/27/2022 0831   PLT 165 07/27/2022 0831   MCV 95.0 07/27/2022 0831   MCH 32.8 07/27/2022 0831   MCHC 34.5 07/27/2022 0831   RDW 13.6 07/27/2022 0831   LYMPHSABS 1.1 07/27/2022 0831   MONOABS 0.5 07/27/2022 0831   EOSABS 0.1 07/27/2022 0831    BASOSABS 0.1 07/27/2022 0831    CMP     Component Value Date/Time   NA 136 07/27/2022 0831   K 3.8 07/27/2022 0831   CL 104 07/27/2022 0831   CO2 26 07/27/2022 0831   GLUCOSE 130 (  H) 07/27/2022 0831   BUN 6 (L) 07/27/2022 0831   CREATININE 0.61 07/27/2022 0831   CALCIUM 9.4 07/27/2022 0831   PROT 6.4 (L) 07/27/2022 0831   ALBUMIN 3.8 07/27/2022 0831   AST 36 07/27/2022 0831   ALT 33 07/27/2022 0831   ALKPHOS 121 07/27/2022 0831   BILITOT 0.4 07/27/2022 0831   GFRNONAA >60 07/27/2022 0831   ASSESSMENT and THERAPY PLAN:   Malignant neoplasm of upper-outer quadrant of right breast in female, estrogen receptor positive (HCC) 08/04/20: Right mastectomy (Dr. 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 Neoadjuvant chemotherapy with TCH Perjeta 6 cycles followed by Kadcyla maintenance discontinued 11/11/2020 because of peripheral neuropathy 2. Followed by mastectomy with sentinel lymph node study 08/04/20 3. Followed by adjuvant radiation therapy 09/18/2020- 11/04/2020 4.  Decided not to do antiestrogen therapy because her ER was 0% on final path. 5.  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 Bone scan 08/03/2021: No definite evidence of metastatic disease  CT chest on July 21, 2022 demonstrating no evidence of metastatic disease  progression   Current treatment: Enhertu Enhertu toxicities:  1. Fatigue: Managed with energy conservation 2.  Constipation: Managed with over-the-counter regimen.  Resolves a few days after therapy 3. Diabetes: On Ozempic.  Sugars are improved.   I reviewed her labs with her which are good today.  She will proceed with treatment.  We will see her back in 4 weeks for labs, follow-up, and her next infusion.  I did place orders for an echocardiogram today.   All questions were answered. The patient knows to call the clinic with any problems, questions or concerns. We can certainly see the patient much sooner if necessary.  Total encounter time:20 minutes*in face-to-face visit time, chart review, lab review, care coordination, order entry, and documentation of the encounter time.  Lillard Anes, NP 07/27/22 9:50 AM Medical Oncology and Hematology [facility] [address] [place], [place] [address] Tel. [phone]    Fax. [phone]  *Total Encounter Time as defined by the Centers for Medicare and Medicaid Services includes, in addition to the face-to-face time of a patient visit (documented in the note above) non-face-to-face time: obtaining and reviewing outside history, ordering and reviewing medications, tests or procedures, care coordination (communications with other health care professionals or caregivers) and documentation in the medical record.

## 2022-08-02 ENCOUNTER — Ambulatory Visit (INDEPENDENT_AMBULATORY_CARE_PROVIDER_SITE_OTHER): Payer: Medicare Other | Admitting: Family Medicine

## 2022-08-02 ENCOUNTER — Encounter (HOSPITAL_BASED_OUTPATIENT_CLINIC_OR_DEPARTMENT_OTHER): Payer: Self-pay | Admitting: Family Medicine

## 2022-08-02 VITALS — BP 136/67 | HR 83 | Ht 66.0 in | Wt 186.0 lb

## 2022-08-02 DIAGNOSIS — Z716 Tobacco abuse counseling: Secondary | ICD-10-CM | POA: Diagnosis not present

## 2022-08-02 DIAGNOSIS — Z0000 Encounter for general adult medical examination without abnormal findings: Secondary | ICD-10-CM | POA: Diagnosis not present

## 2022-08-02 DIAGNOSIS — E119 Type 2 diabetes mellitus without complications: Secondary | ICD-10-CM | POA: Diagnosis not present

## 2022-08-02 NOTE — Patient Instructions (Signed)
Continue with the 14mg  Nicotine Patches.   After 2-3 weeks, we can reduce to 7mg  patches instead.

## 2022-08-02 NOTE — Progress Notes (Addendum)
Subjective:   Sabrina Woods April 19, 1955 08/02/2022  No chief complaint on file.   HPI: Sabrina Woods presents today for re-assessment and management of chronic medical conditions.   SMOKING CESSATION:  Patient reports tolerating Nicotine patches well for the past 2 weeks. She has reduced her cigarette intake to no more than 5 cigarettes per day. Currently using Nicotine 14mg  patches each day. Denies adverse side effects or symptoms.     DIABETES MELLITUS: Sabrina Woods presents for the medical management of diabetes. Currently undergoing chemotherapy for breast cancer treatment as well.  Current diabetes medication regimen: Ozempic 0.5mg  Patient is  adhering to a diabetic diet.  Patient is  exercising regularly.  Patient is not checking BS regularly.  Patient is  checking their feet regularly.  Denies polydipsia, polyphagia, polyuria, open wounds or ulcers on feet.  Lab Results  Component Value Date   HGBA1C 8.6 (H) 07/14/2022    07/14/2022 No results found for: "LABMICR", "MICROALBUR"  Wt Readings from Last 3 Encounters:  08/02/22 186 lb (84.4 kg)  07/27/22 193 lb 6.4 oz (87.7 kg)  07/14/22 186 lb (84.4 kg)      The following portions of the patient's history were reviewed and updated as appropriate: past medical history, past surgical history, family history, social history, allergies, medications, and problem list.   Patient Active Problem List   Diagnosis Date Noted   Encounter to establish care with new doctor 07/14/2022   Screening for colon cancer 07/14/2022   Encounter for smoking cessation counseling 07/14/2022   Essential (primary) hypertension 01/19/2022   Type 2 diabetes mellitus without complication, without long-term current use of insulin (HCC) 01/19/2022   Fatigue 04/08/2021   S/P mastectomy, right 08/04/2020   Chemotherapy-induced peripheral neuropathy (HCC) 05/20/2020   Malignant neoplasm of upper-outer quadrant of right breast  in female, estrogen receptor positive (HCC) 03/10/2020   Fibroid    Smoker    Depression    Past Medical History:  Diagnosis Date   Acid reflux    Anxiety    Breast cancer (HCC)    Breast cancer (HCC) 2022   Depression    HISTORY OF   Fibroid    PONV (postoperative nausea and vomiting)    Smoker    Past Surgical History:  Procedure Laterality Date   CESAREAN SECTION     X 2   MASTECTOMY     MASTECTOMY WITH AXILLARY LYMPH NODE DISSECTION Right 08/04/2020   Procedure: RIGHT MASTECTOMY WITH RIGHT AXILLARY LYMPH NODE DISSECTION;  Surgeon: [name], MD;  Location: [facility] OR;  Service: General;  Laterality: Right;   PORTACATH PLACEMENT Left 03/17/2020   Procedure: INSERTION PORT-A-CATH;  Surgeon: [name], MD;  Location: [facility];  Service: General;  Laterality: Left;   PORTACATH PLACEMENT Left 08/05/2021   Procedure: PORT PLACEMENT WITH ULTRASOUND GUIDANCE;  Surgeon: [name], MD;  Location: [facility];  Service: General;  Laterality: Left;  LMA   Family History  Problem Relation Age of Onset   Cancer Mother    Breast cancer Mother    Diabetes Mother    Hypertension Mother    Hypertension Father    Hypertension Sister    Hypertension Brother    Outpatient Medications Prior to Visit  Medication Sig Dispense Refill   ACCU-CHEK GUIDE test strip See admin instructions.     ALPRAZolam (XANAX) 0.25 MG tablet Take 1 tablet (0.25 mg total) by mouth 2 (two) times daily as needed  for anxiety. 60 tablet 3   blood glucose meter kit and supplies KIT Dispense based on patient and insurance preference. Use up to four times daily as directed. 1 each 0   esomeprazole (NEXIUM) 20 MG capsule Take 20 mg by mouth daily.     gabapentin (NEURONTIN) 600 MG tablet Take 1 tablet (600 mg total) by mouth 3 (three) times daily. 90 tablet 3   lidocaine-prilocaine (EMLA) cream APPLY TO AFFECTED AREA ONCE AS DIRECTED     Multiple Vitamin  (MULTIVITAMIN) capsule Take 1 capsule by mouth daily.     nicotine (NICODERM CQ - DOSED IN MG/24 HOURS) 14 mg/24hr patch Place 1 patch (14 mg total) onto the skin daily. 28 patch 0   ondansetron (ZOFRAN) 24 MG tablet Take 24 mg by mouth once.     OVER THE COUNTER MEDICATION Stool softener     Semaglutide,0.25 or 0.5MG /DOS, 2 MG/3ML SOPN Inject 0.5 mg into the skin once a week 3 mL 1   No facility-administered medications prior to visit.   No Known Allergies   ROS: A complete ROS was performed with pertinent positives/negatives noted in the HPI. The remainder of the ROS are negative.    Objective:   Today's Vitals   08/02/22 0809 08/02/22 0848  BP: 136/67   Pulse: 83   SpO2: 100%   Weight: 186 lb (84.4 kg)   Height: 5\' 6"  (1.676 m)   PainSc:  0-No pain    Physical Exam          GENERAL: Well-appearing, in NAD. Well nourished.  SKIN: Pink, warm and dry. No rash, lesion, ulceration, or ecchymoses.  Head: Normocephalic. NECK: Trachea midline. Full ROM w/o pain or tenderness. No lymphadenopathy.  EARS: Tympanic membranes are intact, translucent without bulging and without drainage. Appropriate landmarks visualized.  EYES: Conjunctiva clear without exudates. EOMI, PERRL, no drainage present.  NOSE: Septum midline w/o deformity. Nares patent, mucosa pink and non-inflamed w/o drainage. No sinus tenderness.  THROAT: Uvula midline. Oropharynx clear. Tonsils non-inflamed without exudate. Mucous membranes pink and moist.  RESPIRATORY: Chest wall symmetrical. Respirations even and non-labored. Breath sounds clear to auscultation bilaterally.  CARDIAC: S1, S2 present, regular rate and rhythm without murmur or gallops. Peripheral pulses 2+ bilaterally.  MSK: Muscle tone and strength appropriate for age. Joints w/o tenderness, redness, or swelling.  EXTREMITIES: Without clubbing, cyanosis, or edema.  NEUROLOGIC: No motor or sensory deficits. Steady, even gait. C2-C12 intact.  PSYCH/MENTAL  STATUS: Alert, oriented x 3. Cooperative, appropriate mood and affect.    Health Maintenance Due  Topic Date Due   OPHTHALMOLOGY EXAM  Never done   Diabetic kidney evaluation - Urine ACR  Never done   Zoster Vaccines- Shingrix (1 of 2) Never done   Fecal DNA (Cologuard)  Never done    No results found for any visits on 08/02/22.  The 10-year ASCVD risk score (Arnett DK, et al., 2019) is: 22.7%     Assessment & Plan:  1. Encounter for Medicare annual wellness exam Completed and recommended preventative screenings, immunizations and care planning.   Patient declines routine vaccines of Shingrix. We discussed the risk of this means means there could be underlying issue or malignancy not identified, increased morbidity, and even mortality. Patient verbalized understanding and acceptance of risk. Patient knows they can change their mind at any time and we will be happy to coordinate these things for them.    2. Type 2 diabetes mellitus without complication, without long-term current use of insulin (  HCC) Discussed good dietary measures and exercise. Recommend diabetic eye exam yearly. Pt tolerating Ozempic 0.5mg  well. Will continue on this dosage and slowly titrate as needed if glucose increase.  Urine albumin completed today in office.  Will recheck A1c in 6 months.  - Microalbumin / creatinine urine ratio  3. Encounter for smoking cessation counseling Continue Nicotine 14mg  patches daily for the next 2-3 weeks. Decrease to 7mg  patches thereafter. Discussed good tips and techniques to continue reduction of cigarette use.   Return in about 6 months (around 02/02/2023) for DIABETES CHECK UP.    Patient to reach out to office if new, worrisome, or unresolved symptoms arise or if no improvement in patient's condition. Patient verbalized understanding and is agreeable to treatment plan. All questions answered to patient's satisfaction.   Of note, portions of this note may have been created  with voice recognition software Physicist, medical). While this note has been edited for accuracy, occasional wrong-word or 'sound-a-like' substitutions may have occurred due to the inherent limitations of voice recognition software.  Yolanda Manges, FNP  Subjective:   Sabrina Woods is a 67 y.o. female who presents for Medicare Annual (Subsequent) preventive examination.  Visit Complete: In person  Patient Medicare AWV questionnaire was completed by the patient on 08/02/22; I have confirmed that all information answered by patient is correct and no changes since this date.  Review of Systems     A complete ROS was performed with pertinent positives/negatives noted in the HPI. The remainder of the ROS are negative.   Cardiac Risk Factors include: advanced age (>75men, >6 women);diabetes mellitus     Objective:    Today's Vitals   08/02/22 0809 08/02/22 0848  BP: 136/67   Pulse: 83   SpO2: 100%   Weight: 186 lb (84.4 kg)   Height: 5\' 6"  (1.676 m)   PainSc:  0-No pain   Body mass index is 30.02 kg/m.     08/02/2022    8:49 AM 07/05/2022   12:05 PM 05/26/2022    9:14 AM 05/05/2022    9:15 AM 04/14/2022    9:03 AM 03/24/2022    9:23 AM 03/02/2022    8:59 AM  Advanced Directives  Does Patient Have a Medical Advance Directive? No No No No No No No  Does patient want to make changes to medical advance directive?  No - Patient declined No - Patient declined No - Patient declined No - Patient declined No - Patient declined No - Patient declined  Copy of Healthcare Power of Attorney in Chart?  No - copy requested No - copy requested No - copy requested  No - copy requested No - copy requested  Would patient like information on creating a medical advance directive? No - Patient declined No - Patient declined No - Patient declined No - Patient declined No - Patient declined No - Patient declined No - Patient declined    Current Medications (verified) Outpatient Encounter Medications  as of 08/02/2022  Medication Sig   ACCU-CHEK GUIDE test strip See admin instructions.   ALPRAZolam (XANAX) 0.25 MG tablet Take 1 tablet (0.25 mg total) by mouth 2 (two) times daily as needed for anxiety.   blood glucose meter kit and supplies KIT Dispense based on patient and insurance preference. Use up to four times daily as directed.   esomeprazole (NEXIUM) 20 MG capsule Take 20 mg by mouth daily.   gabapentin (NEURONTIN) 600 MG tablet Take 1 tablet (600 mg total) by  mouth 3 (three) times daily.   lidocaine-prilocaine (EMLA) cream APPLY TO AFFECTED AREA ONCE AS DIRECTED   Multiple Vitamin (MULTIVITAMIN) capsule Take 1 capsule by mouth daily.   nicotine (NICODERM CQ - DOSED IN MG/24 HOURS) 14 mg/24hr patch Place 1 patch (14 mg total) onto the skin daily.   ondansetron (ZOFRAN) 24 MG tablet Take 24 mg by mouth once.   OVER THE COUNTER MEDICATION Stool softener   Semaglutide,0.25 or 0.5MG /DOS, 2 MG/3ML SOPN Inject 0.5 mg into the skin once a week   No facility-administered encounter medications on file as of 08/02/2022.    Allergies (verified) Patient has no known allergies.   History: Past Medical History:  Diagnosis Date   Acid reflux    Anxiety    Breast cancer (HCC)    Breast cancer (HCC) 2022   Depression    HISTORY OF   Fibroid    PONV (postoperative nausea and vomiting)    Smoker    Past Surgical History:  Procedure Laterality Date   CESAREAN SECTION     X 2   MASTECTOMY     MASTECTOMY WITH AXILLARY LYMPH NODE DISSECTION Right 08/04/2020   Procedure: RIGHT MASTECTOMY WITH RIGHT AXILLARY LYMPH NODE DISSECTION;  Surgeon: [name], MD;  Location: [facility] OR;  Service: General;  Laterality: Right;   PORTACATH PLACEMENT Left 03/17/2020   Procedure: INSERTION PORT-A-CATH;  Surgeon: [name], MD;  Location: [facility];  Service: General;  Laterality: Left;   PORTACATH PLACEMENT Left 08/05/2021   Procedure: PORT PLACEMENT WITH ULTRASOUND GUIDANCE;   Surgeon: [name], MD;  Location:  [facility]ERY [facility];  Service: General;  Laterality: Left;  LMA   Family History  Problem Relation Age of Onset   Cancer Mother    Breast cancer Mother    Diabetes Mother    Hypertension Mother    Hypertension Father    Hypertension Sister    Hypertension Brother    Social History   Socioeconomic History   Marital status: Married    Spouse name: Not on file   Number of children: Not on file   Years of education: Not on file   Highest education level: Some college, no degree  Occupational History   Not on file  Tobacco Use   Smoking status: Every Day    Current packs/day: 1.00    Average packs/day: 1 pack/day for 49.6 years (49.6 ttl pk-yrs)    Types: Cigarettes    Start date: 1975   Smokeless tobacco: Never   Tobacco comments:    Using nicotine patch; currently smoking 2-3 cigs per day as of 07/14/22 ep  Vaping Use   Vaping status: Never Used  Substance and Sexual Activity   Alcohol use: Not Currently   Drug use: Never   Sexual activity: Yes    Birth control/protection: Post-menopausal  Other Topics Concern   Not on file  Social History Narrative   Right handed    Caffeine- 5-6 cups per day    Social Determinants of Health   Financial Resource Strain: Low Risk  (08/01/2022)   Overall Financial Resource Strain (CARDIA)    Difficulty of Paying Living Expenses: Not hard at all  Food Insecurity: No Food Insecurity (08/01/2022)   Hunger Vital Sign    Worried About Running Out of Food in the Last Year: Never true    Ran Out of Food in the Last Year: Never true  Transportation Needs: No Transportation Needs (08/01/2022)   PRAPARE - Transportation  Lack of Transportation (Medical): No    Lack of Transportation (Non-Medical): No  Physical Activity: Insufficiently Active (08/01/2022)   Exercise Vital Sign    Days of Exercise per Week: 3 days    Minutes of Exercise per Session: 20 min  Stress: No Stress Concern Present  (08/01/2022)   [employer] of Occupational Health - Occupational Stress Questionnaire    Feeling of Stress : Only a little  Social Connections: Moderately Isolated (08/01/2022)   Social Connection and Isolation Panel [NHANES]    Frequency of Communication with Friends and Family: More than three times a week    Frequency of Social Gatherings with Friends and Family: Twice a week    Attends Religious Services: Never    Database administrator or Organizations: No    Attends Engineer, structural: Not on file    Marital Status: Married    Tobacco Counseling Ready to quit: Not Answered Counseling given: Not Answered Tobacco comments: Using nicotine patch; currently smoking 2-3 cigs per day as of 07/14/22 ep   Clinical Intake:     Pain : No/denies pain Pain Score: 0-No pain     Nutritional Status: BMI of 19-24  Normal Diabetes: Yes CBG done?: No Did pt. bring in CBG monitor from home?: No  How often do you need to have someone help you when you read instructions, pamphlets, or other written materials from your doctor or pharmacy?: 1 - Never What is the last grade level you completed in school?: Some college  Interpreter Needed?: No      Activities of Daily Living    08/02/2022    8:48 AM 08/05/2021    1:31 PM  In your present state of health, do you have any difficulty performing the following activities:  Hearing? 0 0  Vision? 1 0  Comment cataract to be addressed by eye provider   Difficulty concentrating or making decisions? 0 0  Walking or climbing stairs? 0 0  Dressing or bathing? 0 0  Doing errands, shopping? 0   Preparing Food and eating ? N   Using the Toilet? N   In the past six months, have you accidently leaked urine? N   Do you have problems with loss of bowel control? N   Managing your Medications? N   Managing your Finances? N   Housekeeping or managing your Housekeeping? N     Patient Care Team: Hilbert Bible, FNP as PCP -  General (Family Medicine) [name], MD as Consulting Physician (General Surgery) Lorean Puffer, MD as Consulting Physician (Radiation Oncology) [name], MD as Consulting Physician (Hematology and Oncology)  Indicate any recent Medical Services you may have received from other than [facility] providers in the past year (date may be approximate).     Assessment:   This is a routine wellness examination for Sabrina Woods.  Hearing/Vision screen No results found. Patient declined hearing or vision concerns.   Dietary issues and exercise activities discussed:     Goals Addressed   None   Depression Screen    08/02/2022    8:50 AM 07/14/2022   11:17 AM  PHQ 2/9 Scores  PHQ - 2 Score 2 2  PHQ- 9 Score  3    Fall Risk    08/02/2022    8:49 AM 07/14/2022   11:16 AM  Fall Risk   Falls in the past year? 0 0  Number falls in past yr: 0 0  Injury with Fall? 0 0  Risk  for fall due to :  No Fall Risks  Follow up  Falls evaluation completed    MEDICARE RISK AT HOME:  Medicare Risk at Home - 08/02/22 0850     Any stairs in or around the home? Yes    If so, are there any without handrails? No    Home free of loose throw rugs in walkways, pet beds, electrical cords, etc? Yes    Adequate lighting in your home to reduce risk of falls? Yes    Life alert? No    Use of a cane, walker or w/c? No    Grab bars in the bathroom? Yes    Shower chair or bench in shower? No    Elevated toilet seat or a handicapped toilet? No             TIMED UP AND GO:  Was the test performed?  Yes  Length of time to ambulate 10 feet: 5 sec Gait steady and fast without use of assistive device    Cognitive Function:        08/02/2022    8:50 AM  6CIT Screen  What Year? 0 points  What month? 0 points  What time? 0 points  Count back from 20 0 points  Months in reverse 0 points  Repeat phrase 0 points  Total Score 0 points    Immunizations Immunization History  Administered Date(s)  Administered   Fluad Quad(high Dose 65+) 11/09/2021   Influenza-Unspecified 11/14/2019   PFIZER(Purple Top)SARS-COV-2 Vaccination 02/16/2019, 04/08/2019, 11/14/2019   PNEUMOCOCCAL CONJUGATE-20 02/10/2021   Tdap 07/18/2019    TDAP status: Up to date  Flu Vaccine status: Due, Education has been provided regarding the importance of this vaccine. Advised may receive this vaccine at local pharmacy or Health Dept. Aware to provide a copy of the vaccination record if obtained from local pharmacy or Health Dept. Verbalized acceptance and understanding.  Pneumococcal vaccine status: Up to date  Covid-19 vaccine status: Declined, Education has been provided regarding the importance of this vaccine but patient still declined. Advised may receive this vaccine at local pharmacy or Health Dept.or vaccine clinic. Aware to provide a copy of the vaccination record if obtained from local pharmacy or Health Dept. Verbalized acceptance and understanding.  Qualifies for Shingles Vaccine? Yes   Zostavax completed No   Shingrix Completed?: No.    Education has been provided regarding the importance of this vaccine. Patient has been advised to call insurance company to determine out of pocket expense if they have not yet received this vaccine. Advised may also receive vaccine at local pharmacy or Health Dept. Verbalized acceptance and understanding.  Screening Tests Health Maintenance  Topic Date Due   OPHTHALMOLOGY EXAM  Never done   Diabetic kidney evaluation - Urine ACR  Never done   Zoster Vaccines- Shingrix (1 of 2) Never done   Fecal DNA (Cologuard)  Never done   PAP SMEAR-Modifier  08/02/2022 (Originally 02/03/2021)   COVID-19 Vaccine (4 - 2023-24 season) 08/18/2022 (Originally 09/03/2021)   Hepatitis C Screening  07/14/2023 (Originally 02/21/1973)   INFLUENZA VACCINE  08/04/2022   HEMOGLOBIN A1C  01/14/2023   MAMMOGRAM  03/25/2023   FOOT EXAM  07/14/2023   Lung Cancer Screening  07/18/2023   Diabetic  kidney evaluation - eGFR measurement  07/27/2023   Medicare Annual Wellness (AWV)  08/02/2023   DTaP/Tdap/Td (2 - Td or Tdap) 07/17/2029   Pneumonia Vaccine 84+ Years old  Completed   DEXA SCAN  Completed  HPV VACCINES  Aged Out    Health Maintenance  Health Maintenance Due  Topic Date Due   OPHTHALMOLOGY EXAM  Never done   Diabetic kidney evaluation - Urine ACR  Never done   Zoster Vaccines- Shingrix (1 of 2) Never done   Fecal DNA (Cologuard)  Never done    Colorectal Cancer Screening: Cologuard ordered at prior visit. Pt has box and will complete.   Mammogram status: Completed with Oncology 03/24/21. Repeat every year   Lung Cancer Screening: (Low Dose CT Chest recommended if Age 69-80 years, 20 pack-year currently smoking OR have quit w/in 15years.) does qualify. Completed per Oncology in June 2024.    Additional Screening:  Hepatitis C Screening: does qualify; Pt declined  Vision Screening: Recommended annual ophthalmology exams for early detection of glaucoma and other disorders of the eye. Is the patient up to date with their annual eye exam?  Yes   Dental Screening: Recommended annual dental exams for proper oral hygiene  Diabetic Foot Exam:   Community Resource Referral / Chronic Care Management: CRR required this visit?    CCM required this visit?       Plan:     I have personally reviewed and noted the following in the patient's chart:   Medical and social history Use of alcohol, tobacco or illicit drugs  Current medications and supplements including opioid prescriptions. Patient is not currently taking opioid prescriptions. Functional ability and status Nutritional status Physical activity Advanced directives List of other physicians Hospitalizations, surgeries, and ER visits in previous 12 months Vitals Screenings to include cognitive, depression, and falls Referrals and appointments  In addition, I have reviewed and discussed with patient  certain preventive protocols, quality metrics, and best practice recommendations. A written personalized care plan for preventive services as well as general preventive health recommendations were provided to patient.     Yolanda Manges, [place]   08/02/2022   After Visit Summary: Printed and given to patient

## 2022-08-03 ENCOUNTER — Other Ambulatory Visit: Payer: Self-pay

## 2022-08-03 ENCOUNTER — Other Ambulatory Visit: Payer: Self-pay | Admitting: Hematology and Oncology

## 2022-08-03 NOTE — Progress Notes (Signed)
Your urine albumin is table. There are no significant changes in your lab results. Contact the office if you have further questions.

## 2022-08-05 ENCOUNTER — Encounter: Payer: Self-pay | Admitting: Hematology and Oncology

## 2022-08-15 ENCOUNTER — Other Ambulatory Visit (HOSPITAL_BASED_OUTPATIENT_CLINIC_OR_DEPARTMENT_OTHER): Payer: Self-pay

## 2022-08-17 ENCOUNTER — Ambulatory Visit (HOSPITAL_COMMUNITY)
Admission: RE | Admit: 2022-08-17 | Discharge: 2022-08-17 | Disposition: A | Discharge status: Home / Self Care | Payer: Medicare Other | Source: Ambulatory Visit | Attending: Adult Health | Admitting: Adult Health

## 2022-08-17 DIAGNOSIS — Z0189 Encounter for other specified special examinations: Secondary | ICD-10-CM

## 2022-08-17 DIAGNOSIS — T50915S Adverse effect of multiple unspecified drugs, medicaments and biological substances, sequela: Secondary | ICD-10-CM | POA: Diagnosis not present

## 2022-08-17 DIAGNOSIS — Z9221 Personal history of antineoplastic chemotherapy: Secondary | ICD-10-CM | POA: Diagnosis not present

## 2022-08-17 DIAGNOSIS — Z5181 Encounter for therapeutic drug level monitoring: Secondary | ICD-10-CM | POA: Insufficient documentation

## 2022-08-17 DIAGNOSIS — Z170 Estrogen receptor positive status [ER+]: Secondary | ICD-10-CM | POA: Insufficient documentation

## 2022-08-17 DIAGNOSIS — C50411 Malignant neoplasm of upper-outer quadrant of right female breast: Secondary | ICD-10-CM | POA: Insufficient documentation

## 2022-08-17 LAB — ECHOCARDIOGRAM COMPLETE
Area-P 1/2: 4.17 cm2
Calc EF: 53.5 %
S' Lateral: 2.4 cm
Single Plane A2C EF: 43.6 %
Single Plane A4C EF: 47.9 %

## 2022-08-22 IMAGING — MG DIGITAL DIAGNOSTIC BILAT W/ TOMO W/ CAD
7 of 11 series · 7 of 31 positions shown · non-contrast
Comparison: 03/10/2010

CLINICAL DATA: Palpable mass in the RIGHT breast for 6 months.
Patient reports her last Bovid-0T vaccine was in [REDACTED] in the
LEFT arm.

EXAM:
DIGITAL DIAGNOSTIC BILATERAL MAMMOGRAM WITH TOMOSYNTHESIS AND CAD;
ULTRASOUND LEFT BREAST LIMITED; ULTRASOUND RIGHT BREAST LIMITED
TECHNIQUE: Bilateral digital diagnostic mammography and breast tomosynthesis
was performed. The images were evaluated with computer-aided
detection.; Targeted ultrasound examination of the left breast was
performed; Targeted ultrasound examination of the right breast was
performed

[R MLO synth-2D]
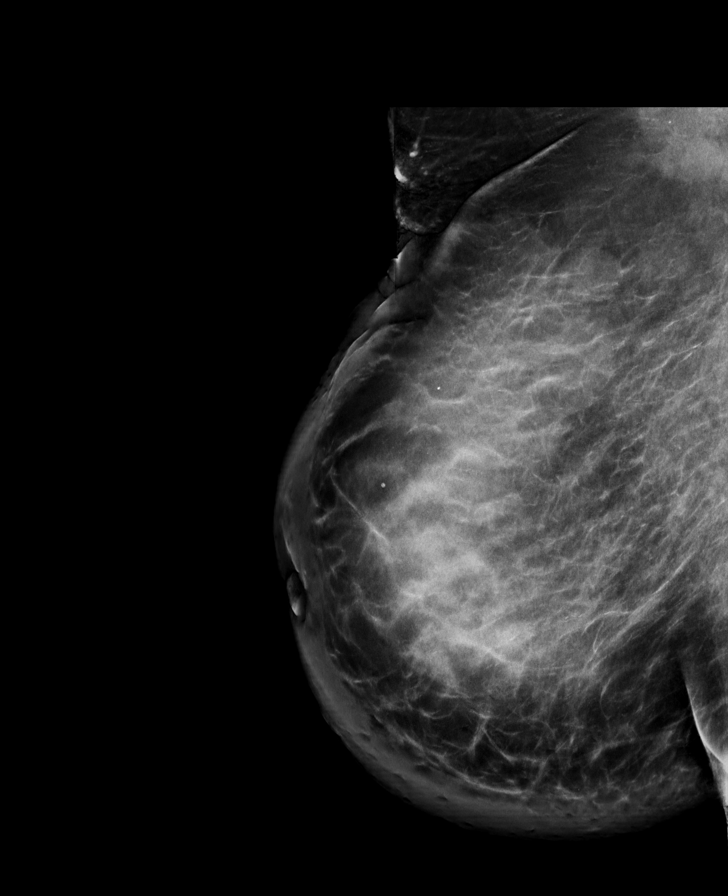

[L CC synth-2D (1 of 2)]
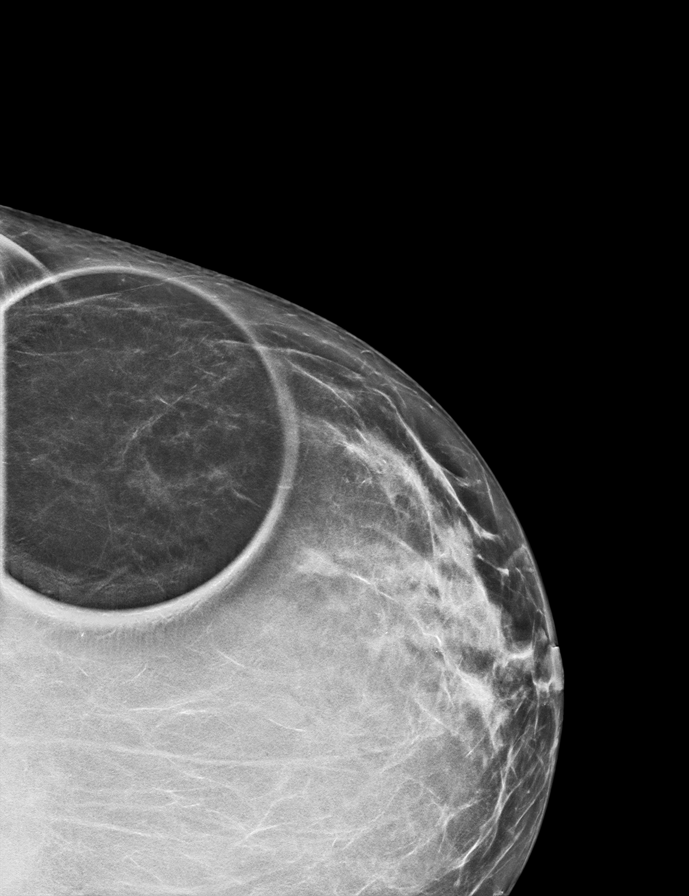

[L MLO synth-2D (1 of 2)]
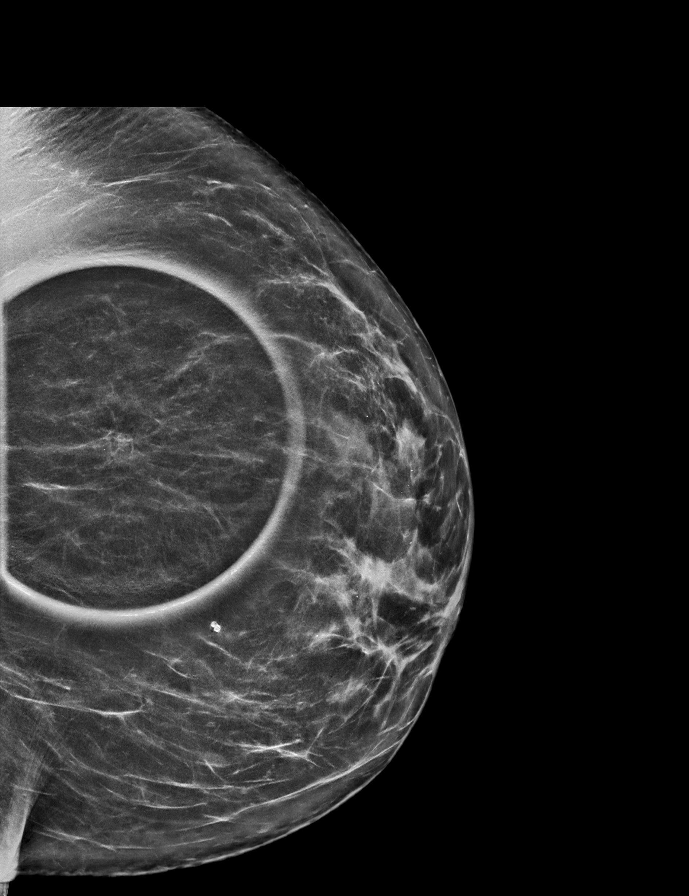

[L MLO synth-2D (2 of 2)]
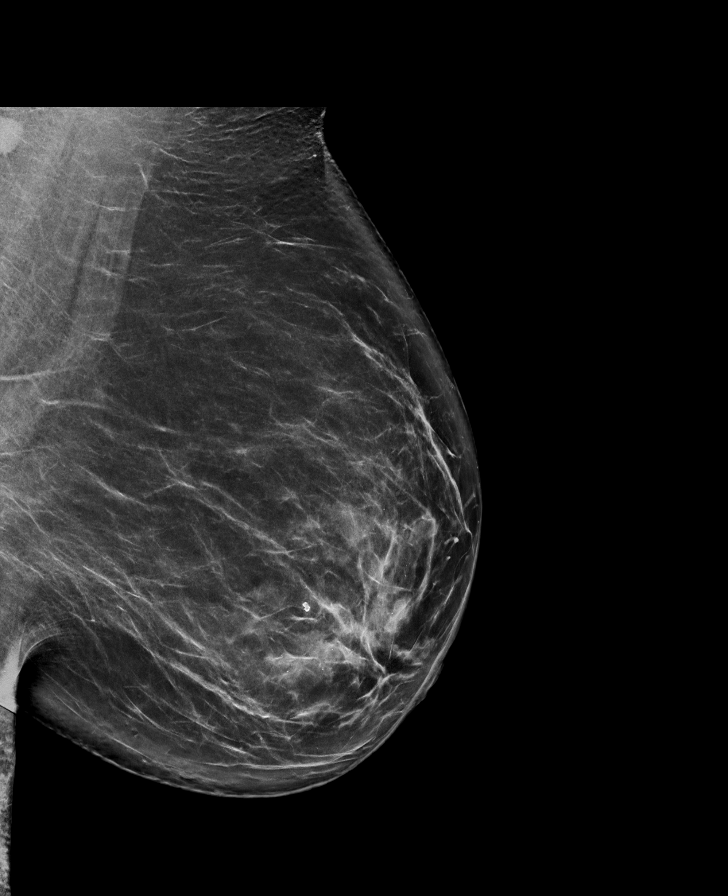

[R CC synth-2D]
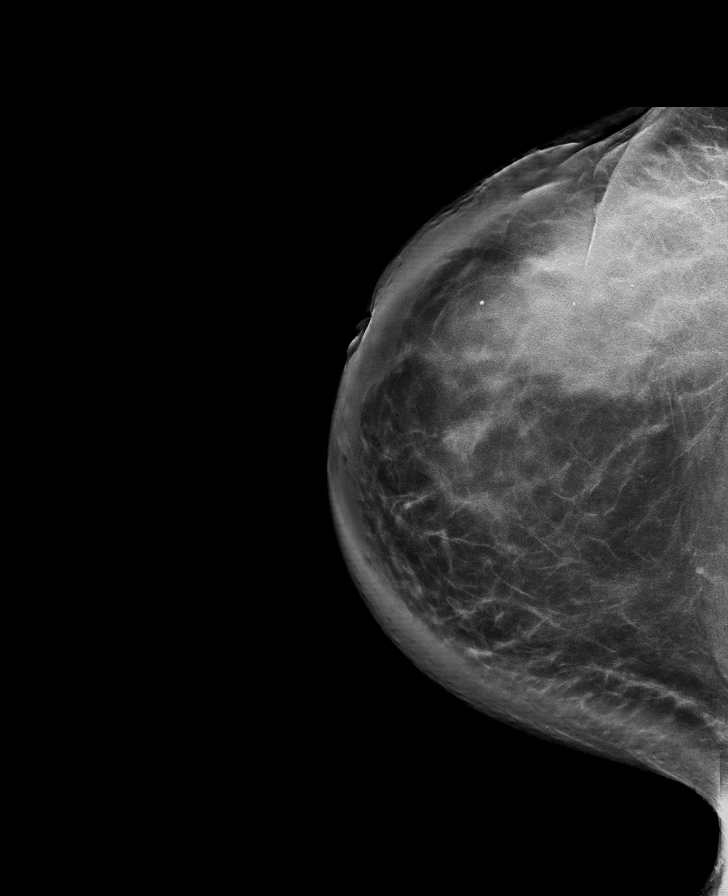

[L CC synth-2D (2 of 2)]
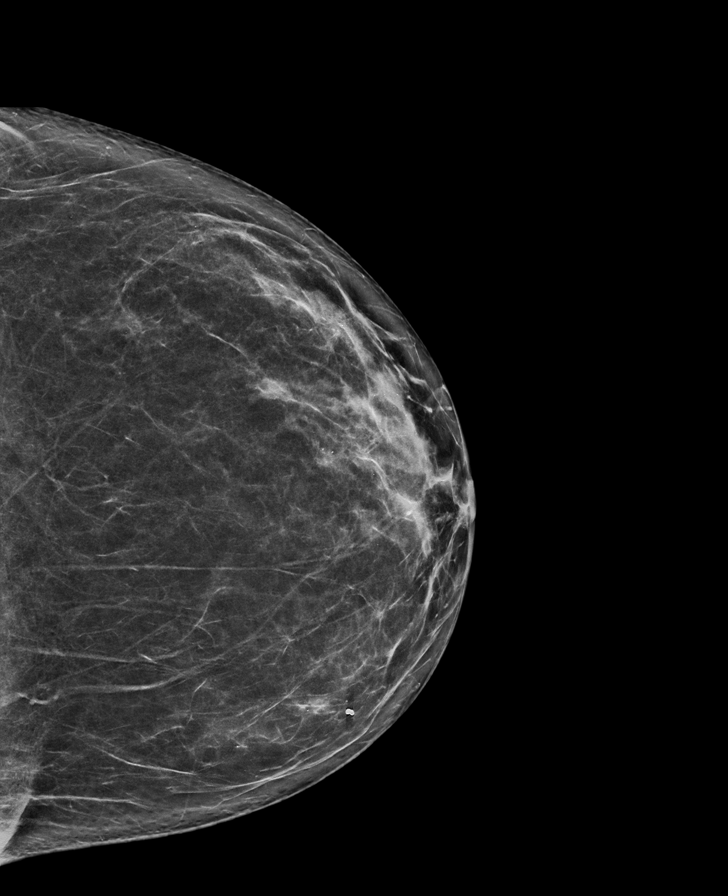

[R CC tomo · tomo slice 67/133.0]
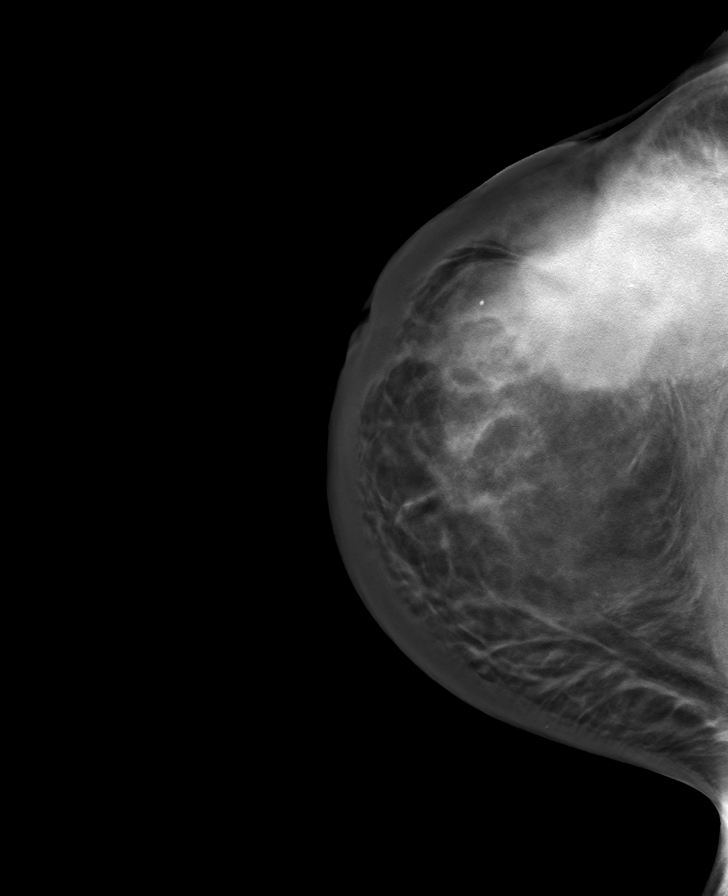

[7 of 31 positions shown; findings below may reference images not displayed]

ACR Breast Density Category b: There are scattered areas of
fibroglandular density.
FINDINGS: RIGHT BREAST:

Mammogram: RIGHT breast is diffusely enlarged with trabecular and
skin thickening. The skin of the LATERAL portion of the breast
measures up to 15 millimeters. There is a dense mass throughout the
LATERAL portion of the RIGHT breast, involving UPPER OUTER and LOWER
OUTER quadrants. A spiculated mass is identified within the LOWER
RIGHT axilla, only partially imaged. Mammographic images were
processed with CAD.

Physical Exam: The entire RIGHT breast is firm and expanded,
compared with the LEFT breast. A firm superficial mass is easily
palpated in the UPPER-OUTER QUADRANT, a portion of which is dark
red-purple, though the skin is intact. I palpate a mass in the RIGHT
axilla.

Ultrasound: Targeted ultrasound is performed, showing diffuse skin
thickening and edema throughout the RIGHT breast, limiting full
sonographic evaluation of the RIGHT breast. The mass UPPER OUTER
QUADRANT is estimated to measure at least 11.0 x 5.9 x
centimeters. Mass is solid and contains internal blood flow by
Doppler evaluation. Mass extends into the skin in the 10 o'clock
location 10 centimeters from the nipple.

In the RIGHT axilla, solid adjacent masses measure 2.6 x 2.4 and
x 2.6 centimeters. These masses obscure the deeper axilla, and other
masses could be present but not detectable. The LOWER most axillary
mass and the breast mass are approximately 2.3 centimeters apart.

LEFT BREAST:

Mammogram: An area of possible distortion in the LATERAL portion of
the LEFT breast is confirmed on spot compression views. Asymmetric
density is identified in the LATERAL aspects of the LEFT breast and
further evaluated with ultrasound. Lymph nodes within the LOWER LEFT
axilla are prominent compared with prior study in 3103. Mammographic
images were processed with CAD.

Physical Exam: I palpate no abnormality in the UPPER-OUTER QUADRANT
of the LEFT breast.

Ultrasound: Targeted ultrasound is performed, showing no sonographic
correlate for the area of distortion in the LATERAL portion of the
LEFT breast.

In the 3:30 o'clock location of the LEFT breast 3 centimeters from
the nipple, a small group of cysts is 1.2 x 0.3 x 1.0 centimeters
and contains mobile internal debris.

In the 3:30 o'clock location 2 centimeters from the nipple, a cyst
is 0.6 x 0.2 centimeters.

In the 3 o'clock retroareolar region of the LEFT breast, a tubular
solid mass is favored to represent a dilated duct with solid
material and measures 3.0 x 0.7 x 2.5 centimeters. Scattered
calcifications are identified within this tubular mass/duct.

Evaluation of the LEFT axilla shows lymph nodes with mildly
thickened cortex, up to 5 millimeters in thickness.
IMPRESSION: 1. Findings are suspicious for RIGHT breast cancer and metastatic
disease to the RIGHT axilla.
2. Focal distortion in the LATERAL portion of the LEFT breast
without sonographic correlate warranting tissue diagnosis.
3. Tubular mass, likely a debris or mass filled duct in the 3
o'clock retroareolar region of the LEFT breast is suspicious for
ductal carcinoma in situ, warranting tissue diagnosis.
4. Mildly prominent LEFT axillary lymph nodes warranting tissue
diagnosis.

RECOMMENDATION:
1. Recommend ultrasound-guided core biopsy of mass in the 10 o'clock
location of the RIGHT breast and 1 of the RIGHT axillary masses.
2. Recommend ultrasound-guided core biopsy of mass in the 3 o'clock
retroareolar region of the LEFT breast and 1 of the mildly prominent
LEFT axillary lymph nodes.
3. Recommend stereotactic guided core biopsy of distortion in the
LATERAL portion of the LEFT breast.

I have discussed the findings and recommendations with the patient.
If applicable, a reminder letter will be sent to the patient
regarding the next appointment.

BI-RADS CATEGORY  5: Highly suggestive of malignancy.

## 2022-08-22 IMAGING — US US BREAST*L* LIMITED INC AXILLA
1 series · 12 of 25 positions shown · non-contrast
Comparison: 03/10/2010

CLINICAL DATA: Palpable mass in the RIGHT breast for 6 months.
Patient reports her last Bovid-0T vaccine was in [REDACTED] in the
LEFT arm.

EXAM:
DIGITAL DIAGNOSTIC BILATERAL MAMMOGRAM WITH TOMOSYNTHESIS AND CAD;
ULTRASOUND LEFT BREAST LIMITED; ULTRASOUND RIGHT BREAST LIMITED
TECHNIQUE: Bilateral digital diagnostic mammography and breast tomosynthesis
was performed. The images were evaluated with computer-aided
detection.; Targeted ultrasound examination of the left breast was
performed; Targeted ultrasound examination of the right breast was
performed

[Series 1: us breast*left* limited inc axilla · 0.07mm/px · 12 of 40 slices shown]
[im 2/40]
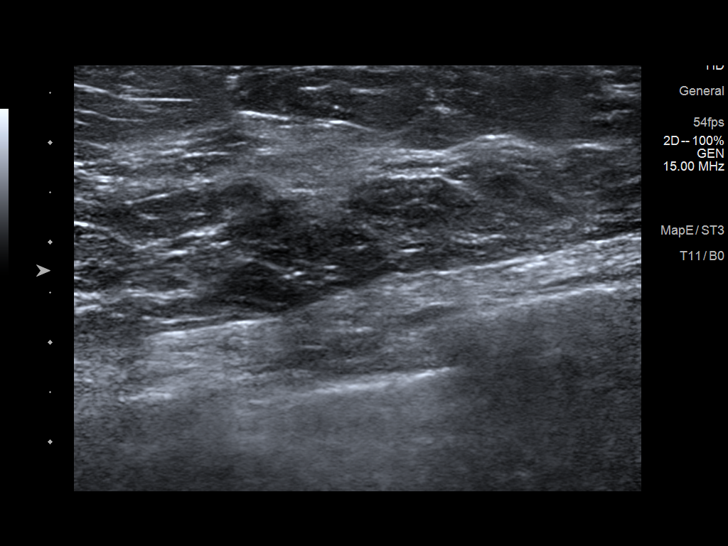
[im 5/40]
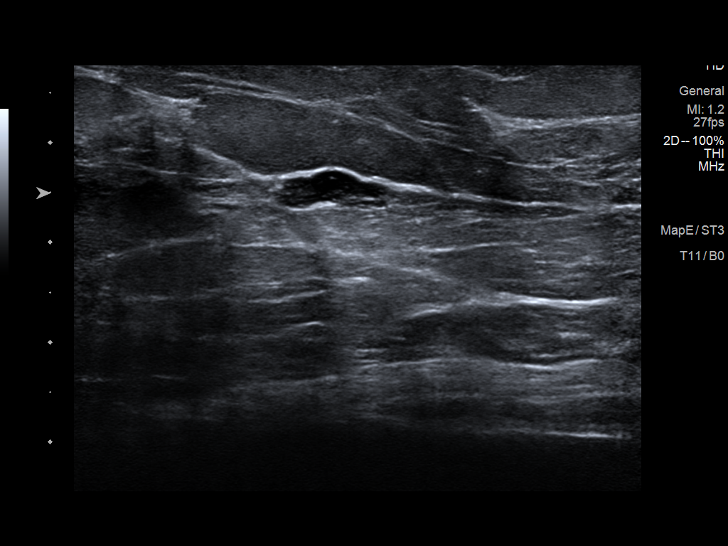
[im 9/40]
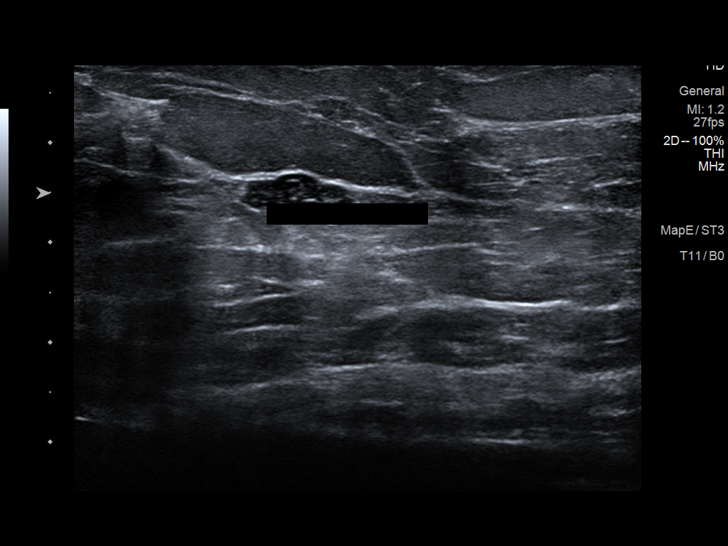
[im 12/40]
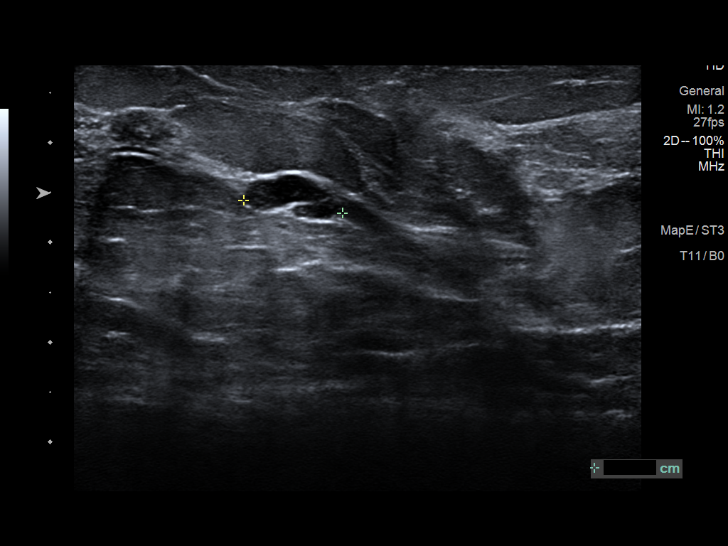
[im 15/40]
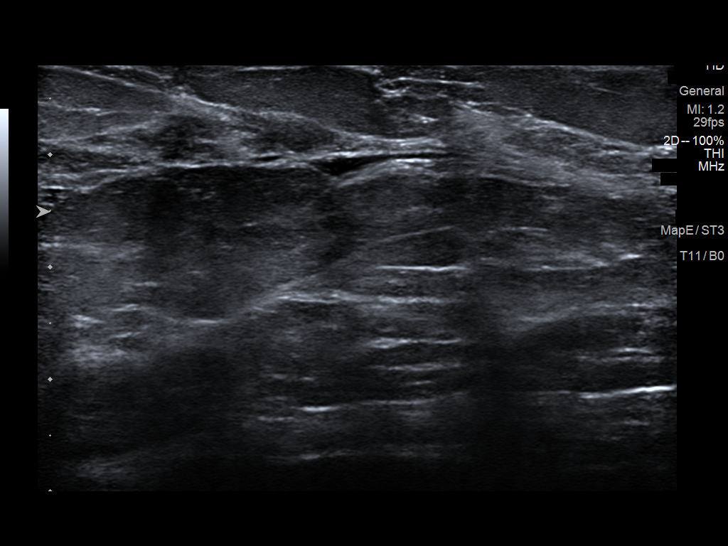
[im 18/40]
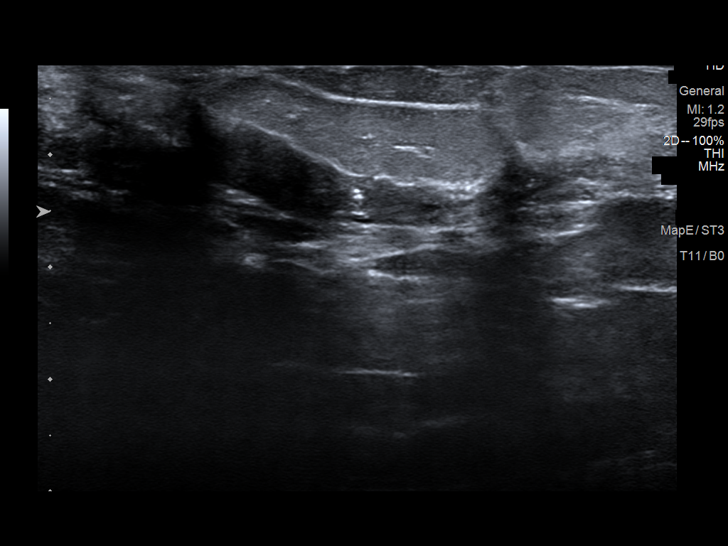
[im 22/40]
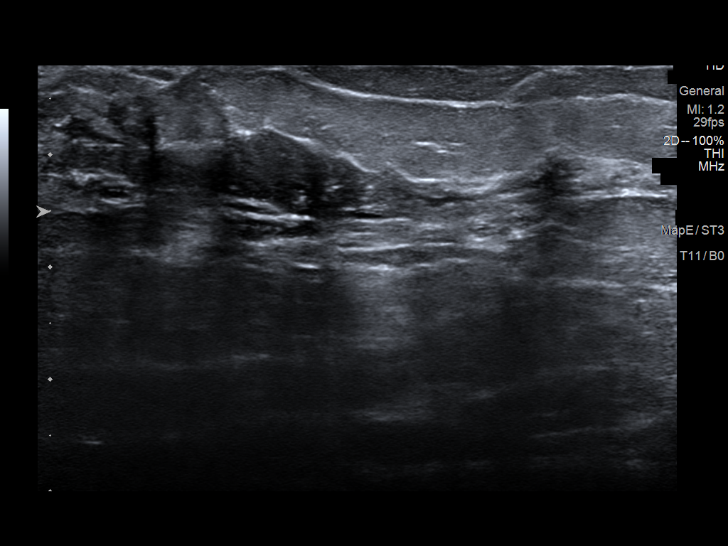
[im 25/40]
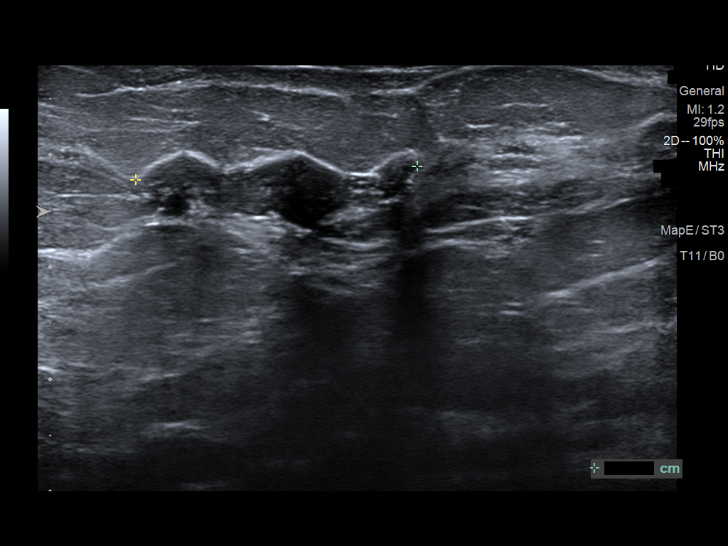
[im 28/40]
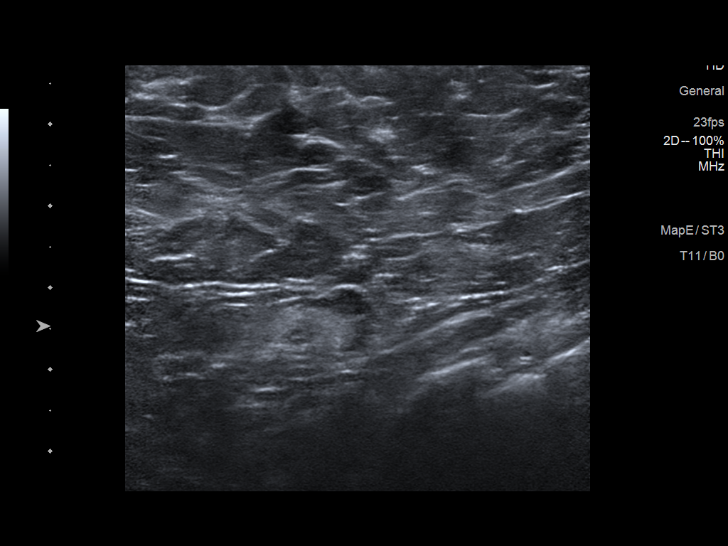
[im 31/40]
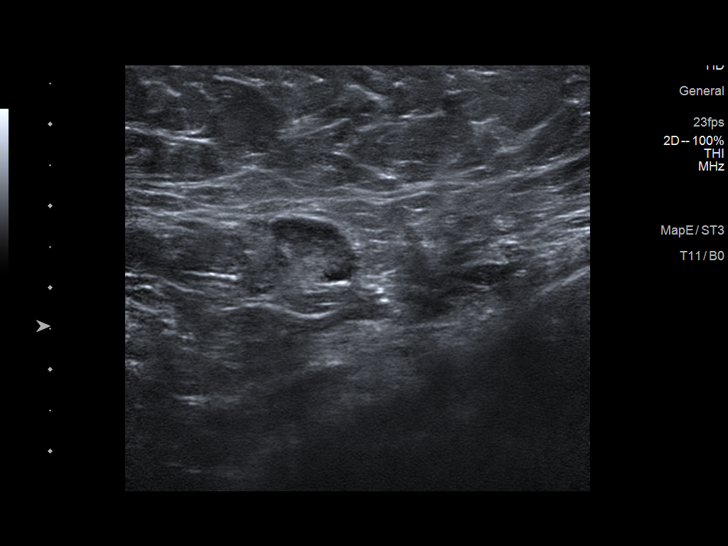
[im 35/40]
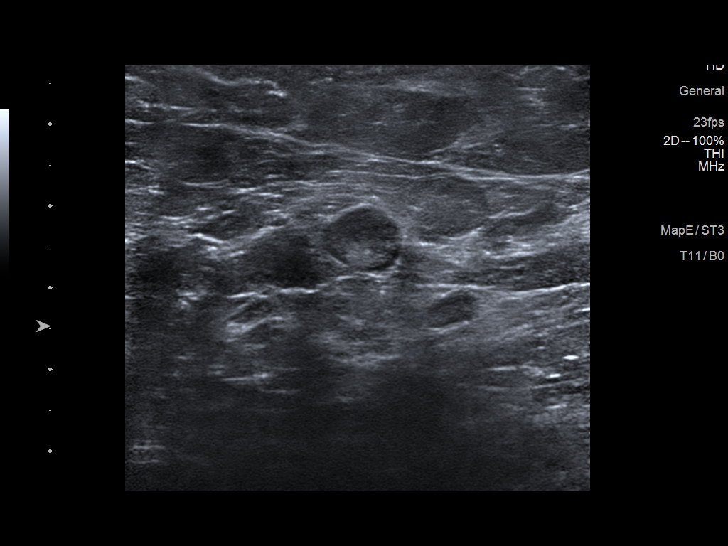
[im 38/40]
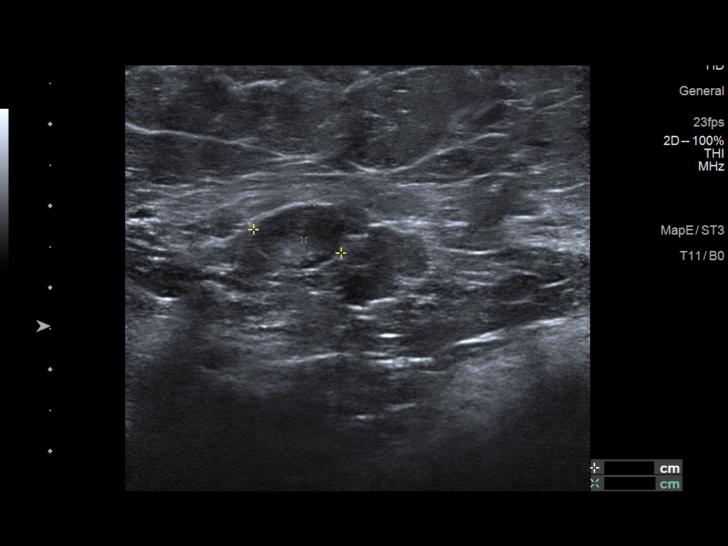

[12 of 25 positions shown; findings below may reference images not displayed]

ACR Breast Density Category b: There are scattered areas of
fibroglandular density.
FINDINGS: RIGHT BREAST:

Mammogram: RIGHT breast is diffusely enlarged with trabecular and
skin thickening. The skin of the LATERAL portion of the breast
measures up to 15 millimeters. There is a dense mass throughout the
LATERAL portion of the RIGHT breast, involving UPPER OUTER and LOWER
OUTER quadrants. A spiculated mass is identified within the LOWER
RIGHT axilla, only partially imaged. Mammographic images were
processed with CAD.

Physical Exam: The entire RIGHT breast is firm and expanded,
compared with the LEFT breast. A firm superficial mass is easily
palpated in the UPPER-OUTER QUADRANT, a portion of which is dark
red-purple, though the skin is intact. I palpate a mass in the RIGHT
axilla.

Ultrasound: Targeted ultrasound is performed, showing diffuse skin
thickening and edema throughout the RIGHT breast, limiting full
sonographic evaluation of the RIGHT breast. The mass UPPER OUTER
QUADRANT is estimated to measure at least 11.0 x 5.9 x
centimeters. Mass is solid and contains internal blood flow by
Doppler evaluation. Mass extends into the skin in the 10 o'clock
location 10 centimeters from the nipple.

In the RIGHT axilla, solid adjacent masses measure 2.6 x 2.4 and
x 2.6 centimeters. These masses obscure the deeper axilla, and other
masses could be present but not detectable. The LOWER most axillary
mass and the breast mass are approximately 2.3 centimeters apart.

LEFT BREAST:

Mammogram: An area of possible distortion in the LATERAL portion of
the LEFT breast is confirmed on spot compression views. Asymmetric
density is identified in the LATERAL aspects of the LEFT breast and
further evaluated with ultrasound. Lymph nodes within the LOWER LEFT
axilla are prominent compared with prior study in 3103. Mammographic
images were processed with CAD.

Physical Exam: I palpate no abnormality in the UPPER-OUTER QUADRANT
of the LEFT breast.

Ultrasound: Targeted ultrasound is performed, showing no sonographic
correlate for the area of distortion in the LATERAL portion of the
LEFT breast.

In the 3:30 o'clock location of the LEFT breast 3 centimeters from
the nipple, a small group of cysts is 1.2 x 0.3 x 1.0 centimeters
and contains mobile internal debris.

In the 3:30 o'clock location 2 centimeters from the nipple, a cyst
is 0.6 x 0.2 centimeters.

In the 3 o'clock retroareolar region of the LEFT breast, a tubular
solid mass is favored to represent a dilated duct with solid
material and measures 3.0 x 0.7 x 2.5 centimeters. Scattered
calcifications are identified within this tubular mass/duct.

Evaluation of the LEFT axilla shows lymph nodes with mildly
thickened cortex, up to 5 millimeters in thickness.
IMPRESSION: 1. Findings are suspicious for RIGHT breast cancer and metastatic
disease to the RIGHT axilla.
2. Focal distortion in the LATERAL portion of the LEFT breast
without sonographic correlate warranting tissue diagnosis.
3. Tubular mass, likely a debris or mass filled duct in the 3
o'clock retroareolar region of the LEFT breast is suspicious for
ductal carcinoma in situ, warranting tissue diagnosis.
4. Mildly prominent LEFT axillary lymph nodes warranting tissue
diagnosis.

RECOMMENDATION:
1. Recommend ultrasound-guided core biopsy of mass in the 10 o'clock
location of the RIGHT breast and 1 of the RIGHT axillary masses.
2. Recommend ultrasound-guided core biopsy of mass in the 3 o'clock
retroareolar region of the LEFT breast and 1 of the mildly prominent
LEFT axillary lymph nodes.
3. Recommend stereotactic guided core biopsy of distortion in the
LATERAL portion of the LEFT breast.

I have discussed the findings and recommendations with the patient.
If applicable, a reminder letter will be sent to the patient
regarding the next appointment.

BI-RADS CATEGORY  5: Highly suggestive of malignancy.

## 2022-08-23 MED FILL — Dexamethasone Sodium Phosphate Inj 100 MG/10ML: INTRAMUSCULAR | Qty: 1 | Status: AC

## 2022-08-23 MED FILL — Fosaprepitant Dimeglumine For IV Infusion 150 MG (Base Eq): INTRAVENOUS | Qty: 5 | Status: AC

## 2022-08-24 ENCOUNTER — Inpatient Hospital Stay (HOSPITAL_BASED_OUTPATIENT_CLINIC_OR_DEPARTMENT_OTHER): Payer: Medicare Other | Admitting: Hematology and Oncology

## 2022-08-24 ENCOUNTER — Inpatient Hospital Stay: Payer: Medicare Other | Attending: Hematology and Oncology

## 2022-08-24 ENCOUNTER — Inpatient Hospital Stay: Payer: Medicare Other

## 2022-08-24 VITALS — BP 142/78 | HR 98 | Temp 98.2°F | Resp 18

## 2022-08-24 VITALS — BP 148/82 | HR 103 | Temp 97.8°F | Resp 18 | Ht 66.0 in | Wt 190.6 lb

## 2022-08-24 DIAGNOSIS — Z170 Estrogen receptor positive status [ER+]: Secondary | ICD-10-CM

## 2022-08-24 DIAGNOSIS — Z5112 Encounter for antineoplastic immunotherapy: Secondary | ICD-10-CM | POA: Insufficient documentation

## 2022-08-24 DIAGNOSIS — C773 Secondary and unspecified malignant neoplasm of axilla and upper limb lymph nodes: Secondary | ICD-10-CM | POA: Diagnosis not present

## 2022-08-24 DIAGNOSIS — C50411 Malignant neoplasm of upper-outer quadrant of right female breast: Secondary | ICD-10-CM

## 2022-08-24 DIAGNOSIS — G629 Polyneuropathy, unspecified: Secondary | ICD-10-CM | POA: Insufficient documentation

## 2022-08-24 DIAGNOSIS — R5383 Other fatigue: Secondary | ICD-10-CM

## 2022-08-24 LAB — CBC WITH DIFFERENTIAL (CANCER CENTER ONLY)
Abs Immature Granulocytes: 0.01 10*3/uL (ref 0.00–0.07)
Basophils Absolute: 0.1 10*3/uL (ref 0.0–0.1)
Basophils Relative: 1 %
Eosinophils Absolute: 0.2 10*3/uL (ref 0.0–0.5)
Eosinophils Relative: 3 %
HCT: 40.3 % (ref 36.0–46.0)
Hemoglobin: 14 g/dL (ref 12.0–15.0)
Immature Granulocytes: 0 %
Lymphocytes Relative: 20 %
Lymphs Abs: 1.2 10*3/uL (ref 0.7–4.0)
MCH: 33.3 pg (ref 26.0–34.0)
MCHC: 34.7 g/dL (ref 30.0–36.0)
MCV: 95.7 fL (ref 80.0–100.0)
Monocytes Absolute: 0.6 10*3/uL (ref 0.1–1.0)
Monocytes Relative: 11 %
Neutro Abs: 3.9 10*3/uL (ref 1.7–7.7)
Neutrophils Relative %: 65 %
Platelet Count: 151 10*3/uL (ref 150–400)
RBC: 4.21 MIL/uL (ref 3.87–5.11)
RDW: 13.4 % (ref 11.5–15.5)
WBC Count: 6 10*3/uL (ref 4.0–10.5)
nRBC: 0 % (ref 0.0–0.2)

## 2022-08-24 LAB — CMP (CANCER CENTER ONLY)
ALT: 35 U/L (ref 0–44)
AST: 38 U/L (ref 15–41)
Albumin: 3.9 g/dL (ref 3.5–5.0)
Alkaline Phosphatase: 126 U/L (ref 38–126)
Anion gap: 7 (ref 5–15)
BUN: 6 mg/dL — ABNORMAL LOW (ref 8–23)
CO2: 26 mmol/L (ref 22–32)
Calcium: 9.4 mg/dL (ref 8.9–10.3)
Chloride: 106 mmol/L (ref 98–111)
Creatinine: 0.62 mg/dL (ref 0.44–1.00)
GFR, Estimated: >60 mL/min (ref >60)
Glucose, Bld: 165 mg/dL — ABNORMAL HIGH (ref 70–99)
Potassium: 3.7 mmol/L (ref 3.5–5.1)
Sodium: 139 mmol/L (ref 135–145)
Total Bilirubin: 0.4 mg/dL (ref 0.3–1.2)
Total Protein: 7.1 g/dL (ref 6.5–8.1)

## 2022-08-24 MED ORDER — ACETAMINOPHEN 325 MG PO TABS
650.0000 mg | ORAL_TABLET | Freq: Once | ORAL | Status: AC
  Administered 2022-08-24: 650 mg via ORAL
  Filled 2022-08-24: qty 2

## 2022-08-24 MED ORDER — DIPHENHYDRAMINE HCL 25 MG PO CAPS
25.0000 mg | ORAL_CAPSULE | Freq: Once | ORAL | Status: AC
  Administered 2022-08-24: 25 mg via ORAL
  Filled 2022-08-24: qty 1

## 2022-08-24 MED ORDER — DEXTROSE 5 % IV SOLN: Freq: Once | INTRAVENOUS | Status: AC

## 2022-08-24 MED ORDER — SODIUM CHLORIDE 0.9 % IV SOLN
150.0000 mg | Freq: Once | INTRAVENOUS | Status: AC
  Administered 2022-08-24: 150 mg via INTRAVENOUS
  Filled 2022-08-24: qty 150

## 2022-08-24 MED ORDER — SODIUM CHLORIDE 0.9% FLUSH
10.0000 mL | Freq: Once | INTRAVENOUS | Status: AC
  Administered 2022-08-24: 10 mL

## 2022-08-24 MED ORDER — PALONOSETRON HCL INJECTION 0.25 MG/5ML
0.2500 mg | Freq: Once | INTRAVENOUS | Status: AC
  Administered 2022-08-24: 0.25 mg via INTRAVENOUS
  Filled 2022-08-24: qty 5

## 2022-08-24 MED ORDER — NICOTINE 14 MG/24HR TD PT24: 14.0000 mg | MEDICATED_PATCH | Freq: Every day | TRANSDERMAL | 0 refills | Status: DC

## 2022-08-24 MED ORDER — SODIUM CHLORIDE 0.9 % IV SOLN
10.0000 mg | Freq: Once | INTRAVENOUS | Status: AC
  Administered 2022-08-24: 10 mg via INTRAVENOUS
  Filled 2022-08-24: qty 10

## 2022-08-24 MED ORDER — FAM-TRASTUZUMAB DERUXTECAN-NXKI CHEMO 100 MG IV SOLR
3.2000 mg/kg | Freq: Once | INTRAVENOUS | Status: AC
  Administered 2022-08-24: 300 mg via INTRAVENOUS
  Filled 2022-08-24: qty 15

## 2022-08-24 NOTE — Assessment & Plan Note (Addendum)
08/04/20: Right mastectomy (Dr. 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 Neoadjuvant chemotherapy with TCH Perjeta 6 cycles followed by Kadcyla maintenance discontinued 11/11/2020 because of peripheral neuropathy 2. Followed by mastectomy with sentinel lymph node study 08/04/20 3. Followed by adjuvant radiation therapy 09/18/2020- 11/04/2020 4.  Decided not to do antiestrogen therapy because her ER was 0% on final path. 5.  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 Bone scan 08/03/2021: No definite evidence of metastatic disease  Echocardiogram 07/22/2021: EF 60 to 65%    Current treatment: Cycle 19 Enhertu Enhertu toxicities:  1. Fatigue that lasted for 3-5 days 2. nausea: Much improved 3.  Insomnia 4.  Worsening neuropathy: stable 5.  Hair thinning 6.  Diabetes: Much better control since he started Ozempic.   CT chest on July 21, 2022 demonstrating no evidence of metastatic disease progression .  Her husband was hospitalized with hypertensive urgency and was found to have an aortic aneurysm.  He is doing much better.   Diabetes: Currently on Ozempic.   Return to clinic every 4 weeks for Enhertu.

## 2022-08-24 NOTE — Patient Instructions (Signed)
[facility]  Discharge Instructions: Thank you for choosing [facility] to provide your oncology and hematology care.   If you have a lab appointment with the Cancer Center, please go directly to the Cancer Center and check in at the registration area.   Wear comfortable clothing and clothing appropriate for easy access to any Portacath or PICC line.   We strive to give you quality time with your provider. You may need to reschedule your appointment if you arrive late (15 or more minutes).  Arriving late affects you and other patients whose appointments are after yours.  Also, if you miss three or more appointments without notifying the office, you may be dismissed from the clinic at the provider's discretion.      For prescription refill requests, have your pharmacy contact our office and allow 72 hours for refills to be completed.    Today you received the following chemotherapy and/or immunotherapy agents Enhertu.   To help prevent nausea and vomiting after your treatment, we encourage you to take your nausea medication as directed.  BELOW ARE SYMPTOMS THAT SHOULD BE REPORTED IMMEDIATELY: *FEVER GREATER THAN 100.4 F (38 C) OR HIGHER *CHILLS OR SWEATING *NAUSEA AND VOMITING THAT IS NOT CONTROLLED WITH YOUR NAUSEA MEDICATION *UNUSUAL SHORTNESS OF BREATH *UNUSUAL BRUISING OR BLEEDING *URINARY PROBLEMS (pain or burning when urinating, or frequent urination) *BOWEL PROBLEMS (unusual diarrhea, constipation, pain near the anus) TENDERNESS IN MOUTH AND THROAT WITH OR WITHOUT PRESENCE OF ULCERS (sore throat, sores in mouth, or a toothache) UNUSUAL RASH, SWELLING OR PAIN  UNUSUAL VAGINAL DISCHARGE OR ITCHING   Items with * indicate a potential emergency and should be followed up as soon as possible or go to the Emergency Department if any problems should occur.  Please show the CHEMOTHERAPY ALERT CARD or IMMUNOTHERAPY ALERT CARD at check-in  to the Emergency Department and triage nurse.  Should you have questions after your visit or need to cancel or reschedule your appointment, please contact [facility]  Dept: [phone]  and follow the prompts.  Office hours are 8:00 a.m. to 4:30 p.m. Monday - Friday. Please note that voicemails left after 4:00 p.m. may not be returned until the following business day.  We are closed weekends and major holidays. You have access to a nurse at all times for urgent questions. Please call the main number to the clinic Dept: [phone] and follow the prompts.   For any non-urgent questions, you may also contact your provider using MyChart. We now offer e-Visits for anyone 18 and older to request care online for non-urgent symptoms. For details visit [email or web address].   Also download the MyChart app! Go to the app store, search "MyChart", open the app, select [facility], and log in with your MyChart username and password.   

## 2022-08-24 NOTE — Progress Notes (Signed)
Patient Care Team: Hilbert Bible, FNP as PCP - General (Family Medicine) [name], MD as Consulting Physician (General Surgery) Shayley Puffer, MD as Consulting Physician (Radiation Oncology) [name], MD as Consulting Physician (Hematology and Oncology)  DIAGNOSIS:  Encounter Diagnosis  Name Primary?   Malignant neoplasm of upper-outer quadrant of right breast in female, estrogen receptor positive (HCC) Yes    SUMMARY OF ONCOLOGIC HISTORY: Oncology History  Malignant neoplasm of upper-outer quadrant of right breast in female, estrogen receptor positive (HCC)  03/10/2020 Initial Diagnosis   Palpable right breast mass x6 months.  Mammogram revealed left breast cysts (intraductal papilloma), 11 cm right breast mass and 2 masses 2.6 cm each in the right axilla: Biopsy grade 3 IDC ER 5% weak, PR 0%, Ki-67 25%, HER-2 3+ IHC positive   03/10/2020 Cancer Staging   Staging form: Breast, AJCC 8th Edition - Clinical stage from 03/10/2020: Stage IIIA (cT3, cN1, cM0, G3, ER+, PR-, HER2+) - Signed by [name], MD on 03/10/2020 Stage prefix: Initial diagnosis   03/19/2020 - 07/01/2020 Chemotherapy   TCH Perjeta   08/04/2020 Surgery   Right mastectomy (Dr. 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, high-grade DCIS with necrosis and calcifications, and 2 right axillary lymph nodes positive for metastatic carcinoma   08/04/2020 Cancer Staging   Staging form: Breast, AJCC 8th Edition - Pathologic stage from 08/04/2020: No Stage Recommended (ypTis (DCIS), pN2, cM0) - Signed by Loa Socks, NP on 02/05/2021 Stage prefix: Post-therapy   08/21/2020 - 10/23/2020 Chemotherapy   Discontinued due to peripheral neuropathy Patient is on Treatment Plan : BREAST ADO-Trastuzumab Emtansine (Kadcyla) q21d      08/2020 -  Anti-estrogen oral therapy    Decided not to do antiestrogen therapy because her ER was 0% on final path.   10/06/2020  - 11/04/2020 Radiation Therapy   Adjuvant radiation    Relapse/Recurrence   Right mastectomy area skin changes: Initially treated for shingles but since lesions did not improve skin biopsy was obtained on 07/14/2021: Infiltrating carcinoma consistent with breast origin CK7 and GA TA 3 positive, ER 0%, PR 0%, HER2 2+ by IHC, FISH negative ratio 1.56, copy #2.1   07/26/2021 -  Chemotherapy   Patient is on Treatment Plan : BREAST METASTATIC Fam-Trastuzumab Deruxtecan-nxki (Enhertu) (5.4) q21d     01/14/2022 Imaging   CT Chest: Slight decrease in size of the right lower lobe pulmonary nodule. Stable tiny non solid nodule in the superior segment of left lower lobe. No new pulmonary nodules. Stable surgical changes from right mastectomy and axillary lymph node dissection. No new developing lymph node enlargement or other mass lesion.  Chest port. Fatty liver infiltration. Aortic Atherosclerosis (ICD10-I70.0).     CHIEF COMPLIANT: Follow-up right breast cancer/ On cycle 16 Enhertu   INTERVAL HISTORY: Sabrina Woods is a 67 yo  female with above-mentioned history of right breast cancer. She presents to the clinic today for a follow-up.  She reports that the fatigue is much better since she is getting the treatment every 4 weeks.  Her sugars are under better control.  This is because of Ozempic.   ALLERGIES:  has No Known Allergies.  MEDICATIONS:  Current Outpatient Medications  Medication Sig Dispense Refill   ACCU-CHEK GUIDE test strip See admin instructions.     ALPRAZolam (XANAX) 0.25 MG tablet Take 1 tablet (0.25 mg total) by mouth 2 (two) times daily as needed for anxiety. 60 tablet 3   blood  glucose meter kit and supplies KIT Dispense based on patient and insurance preference. Use up to four times daily as directed. 1 each 0   esomeprazole (NEXIUM) 20 MG capsule Take 20 mg by mouth daily.     gabapentin (NEURONTIN) 600 MG tablet TAKE 1 TABLET BY MOUTH THREE TIMES A DAY 270 tablet 3    lidocaine-prilocaine (EMLA) cream APPLY TO AFFECTED AREA ONCE AS DIRECTED     Multiple Vitamin (MULTIVITAMIN) capsule Take 1 capsule by mouth daily.     nicotine (NICODERM CQ - DOSED IN MG/24 HOURS) 14 mg/24hr patch Place 1 patch (14 mg total) onto the skin daily. 28 patch 0   ondansetron (ZOFRAN) 24 MG tablet Take 24 mg by mouth once.     OVER THE COUNTER MEDICATION Stool softener     Semaglutide,0.25 or 0.5MG /DOS, 2 MG/3ML SOPN Inject 0.5 mg into the skin once a week 3 mL 1   No current facility-administered medications for this visit.    PHYSICAL EXAMINATION: ECOG PERFORMANCE STATUS: 1 - Symptomatic but completely ambulatory  Vitals:   08/24/22 0809  BP: (!) 148/82  Pulse: (!) 103  Resp: 18  Temp: 97.8 F (36.6 C)  SpO2: 100%   Filed Weights   08/24/22 0809  Weight: 190 lb 9.6 oz (86.5 kg)     LABORATORY DATA:  I have reviewed the data as listed    Latest Ref Rng & Units 07/27/2022    8:31 AM 07/05/2022   11:18 AM 06/16/2022    8:23 AM  CMP  Glucose 70 - 99 mg/dL 528  413  244   BUN 8 - 23 mg/dL 6  6  6    Creatinine 0.44 - 1.00 mg/dL 0.10  2.72  5.36   Sodium 135 - 145 mmol/L 136  138  138   Potassium 3.5 - 5.1 mmol/L 3.8  4.0  3.7   Chloride 98 - 111 mmol/L 104  106  106   CO2 22 - 32 mmol/L 26  25  25    Calcium 8.9 - 10.3 mg/dL 9.4  9.7  9.5   Total Protein 6.5 - 8.1 g/dL 6.4  6.5  7.1   Total Bilirubin 0.3 - 1.2 mg/dL 0.4  0.5  0.4   Alkaline Phos 38 - 126 U/L 121  113  121   AST 15 - 41 U/L 36  40  37   ALT 0 - 44 U/L 33  38  37     Lab Results  Component Value Date   WBC 6.0 08/24/2022   HGB 14.0 08/24/2022   HCT 40.3 08/24/2022   MCV 95.7 08/24/2022   PLT 151 08/24/2022   NEUTROABS 3.9 08/24/2022    ASSESSMENT & PLAN:  Malignant neoplasm of upper-outer quadrant of right breast in female, estrogen receptor positive (HCC) 08/04/20: Right mastectomy (Dr. 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 Neoadjuvant chemotherapy with TCH Perjeta 6 cycles followed by Kadcyla maintenance discontinued 11/11/2020 because of peripheral neuropathy 2. Followed by mastectomy with sentinel lymph node study 08/04/20 3. Followed by adjuvant radiation therapy 09/18/2020- 11/04/2020 4.  Decided not to do antiestrogen therapy because her ER was 0% on final path. 5.  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  Current treatment: Enhertu ----------------------------------------------------------------------------------------------------------------- Right mastectomy area skin changes: Initially treated for shingles but since lesions did not improve skin biopsy  was obtained on 07/14/2021: Infiltrating carcinoma consistent with breast origin CK7 and GA TA 3 positive, ER 0%, PR 0%, HER2 2+ by IHC, FISH negative ratio 1.56, copy #2.1   CT CAP 08/03/2021: Solid 0.8 cm right lower lobe lung nodule. Bone scan 08/03/2021: No definite evidence of metastatic disease  Echocardiogram 07/22/2021: EF 60 to 65%    Current treatment: Cycle 19 Enhertu Enhertu toxicities:  1. Fatigue that lasted for 3-5 days 2. nausea: Much improved 3.  Insomnia 4.  Worsening neuropathy: stable 5.  Hair thinning 6.  Diabetes: Much better control since he started Ozempic.   CT chest on July 21, 2022 demonstrating no evidence of metastatic disease progression .  Her husband was hospitalized with hypertensive urgency and was found to have an aortic aneurysm.  He is doing much better.   Diabetes: Currently on Ozempic.  Next scan will be in November. Return to clinic every 4 weeks for Enhertu.    No orders of the defined types were placed in this encounter.  The patient has a good understanding of the overall plan. she agrees with it. she will call with any problems that may develop before the next visit here. Total time spent:  30 mins including face to face time and time spent for planning, charting and co-ordination of care   Tamsen Meek, MD 08/24/22    I Janan Ridge am acting as a Neurosurgeon for [employer]  I have reviewed the above documentation for accuracy and completeness, and I agree with the above.

## 2022-08-26 ENCOUNTER — Ambulatory Visit (HOSPITAL_COMMUNITY): Payer: Medicare Other

## 2022-09-06 ENCOUNTER — Other Ambulatory Visit (HOSPITAL_BASED_OUTPATIENT_CLINIC_OR_DEPARTMENT_OTHER): Payer: Self-pay | Admitting: Family Medicine

## 2022-09-06 ENCOUNTER — Encounter (HOSPITAL_BASED_OUTPATIENT_CLINIC_OR_DEPARTMENT_OTHER): Payer: Self-pay | Admitting: Family Medicine

## 2022-09-06 ENCOUNTER — Other Ambulatory Visit (HOSPITAL_BASED_OUTPATIENT_CLINIC_OR_DEPARTMENT_OTHER): Payer: Self-pay

## 2022-09-06 MED ORDER — SEMAGLUTIDE(0.25 OR 0.5MG/DOS) 2 MG/3ML ~~LOC~~ SOPN
0.5000 mg | PEN_INJECTOR | SUBCUTANEOUS | 3 refills | Status: DC
  Filled 2022-09-06: qty 6, 56d supply, fill #0
  Filled 2022-11-03: qty 6, 56d supply, fill #1
  Filled 2022-12-29: qty 6, 56d supply, fill #2

## 2022-09-20 MED FILL — Fosaprepitant Dimeglumine For IV Infusion 150 MG (Base Eq): INTRAVENOUS | Qty: 5 | Status: AC

## 2022-09-20 MED FILL — Dexamethasone Sodium Phosphate Inj 100 MG/10ML: INTRAMUSCULAR | Qty: 1 | Status: AC

## 2022-09-21 ENCOUNTER — Inpatient Hospital Stay: Payer: Medicare Other

## 2022-09-21 ENCOUNTER — Inpatient Hospital Stay: Payer: Medicare Other | Attending: Hematology and Oncology | Admitting: Hematology and Oncology

## 2022-09-21 VITALS — BP 151/80 | HR 77 | Resp 18

## 2022-09-21 VITALS — BP 140/67 | HR 91 | Temp 97.3°F | Resp 18 | Ht 66.0 in | Wt 189.3 lb

## 2022-09-21 DIAGNOSIS — G620 Drug-induced polyneuropathy: Secondary | ICD-10-CM | POA: Diagnosis not present

## 2022-09-21 DIAGNOSIS — Z9221 Personal history of antineoplastic chemotherapy: Secondary | ICD-10-CM | POA: Insufficient documentation

## 2022-09-21 DIAGNOSIS — Z9011 Acquired absence of right breast and nipple: Secondary | ICD-10-CM | POA: Insufficient documentation

## 2022-09-21 DIAGNOSIS — C792 Secondary malignant neoplasm of skin: Secondary | ICD-10-CM | POA: Insufficient documentation

## 2022-09-21 DIAGNOSIS — Z7985 Long-term (current) use of injectable non-insulin antidiabetic drugs: Secondary | ICD-10-CM | POA: Diagnosis not present

## 2022-09-21 DIAGNOSIS — Z923 Personal history of irradiation: Secondary | ICD-10-CM | POA: Diagnosis not present

## 2022-09-21 DIAGNOSIS — Z171 Estrogen receptor negative status [ER-]: Secondary | ICD-10-CM | POA: Insufficient documentation

## 2022-09-21 DIAGNOSIS — C50411 Malignant neoplasm of upper-outer quadrant of right female breast: Secondary | ICD-10-CM

## 2022-09-21 DIAGNOSIS — Z170 Estrogen receptor positive status [ER+]: Secondary | ICD-10-CM

## 2022-09-21 DIAGNOSIS — Z5112 Encounter for antineoplastic immunotherapy: Secondary | ICD-10-CM | POA: Diagnosis present

## 2022-09-21 DIAGNOSIS — T451X5A Adverse effect of antineoplastic and immunosuppressive drugs, initial encounter: Secondary | ICD-10-CM | POA: Diagnosis not present

## 2022-09-21 DIAGNOSIS — E119 Type 2 diabetes mellitus without complications: Secondary | ICD-10-CM | POA: Insufficient documentation

## 2022-09-21 DIAGNOSIS — R5383 Other fatigue: Secondary | ICD-10-CM

## 2022-09-21 DIAGNOSIS — E1140 Type 2 diabetes mellitus with diabetic neuropathy, unspecified: Secondary | ICD-10-CM

## 2022-09-21 DIAGNOSIS — C773 Secondary and unspecified malignant neoplasm of axilla and upper limb lymph nodes: Secondary | ICD-10-CM | POA: Diagnosis not present

## 2022-09-21 LAB — CMP (CANCER CENTER ONLY)
ALT: 32 U/L (ref 0–44)
AST: 37 U/L (ref 15–41)
Albumin: 3.9 g/dL (ref 3.5–5.0)
Alkaline Phosphatase: 132 U/L — ABNORMAL HIGH (ref 38–126)
Anion gap: 6 (ref 5–15)
BUN: 8 mg/dL (ref 8–23)
CO2: 27 mmol/L (ref 22–32)
Calcium: 9.7 mg/dL (ref 8.9–10.3)
Chloride: 106 mmol/L (ref 98–111)
Creatinine: 0.67 mg/dL (ref 0.44–1.00)
GFR, Estimated: >60 mL/min
Glucose, Bld: 131 mg/dL — ABNORMAL HIGH (ref 70–99)
Potassium: 3.8 mmol/L (ref 3.5–5.1)
Sodium: 139 mmol/L (ref 135–145)
Total Bilirubin: 0.4 mg/dL (ref 0.3–1.2)
Total Protein: 7.1 g/dL (ref 6.5–8.1)

## 2022-09-21 LAB — CBC WITH DIFFERENTIAL (CANCER CENTER ONLY)
Abs Immature Granulocytes: 0.01 10*3/uL (ref 0.00–0.07)
Basophils Absolute: 0.1 10*3/uL (ref 0.0–0.1)
Basophils Relative: 1 %
Eosinophils Absolute: 0.2 10*3/uL (ref 0.0–0.5)
Eosinophils Relative: 2 %
HCT: 41.1 % (ref 36.0–46.0)
Hemoglobin: 13.8 g/dL (ref 12.0–15.0)
Immature Granulocytes: 0 %
Lymphocytes Relative: 18 %
Lymphs Abs: 1.2 10*3/uL (ref 0.7–4.0)
MCH: 32 pg (ref 26.0–34.0)
MCHC: 33.6 g/dL (ref 30.0–36.0)
MCV: 95.4 fL (ref 80.0–100.0)
Monocytes Absolute: 0.6 10*3/uL (ref 0.1–1.0)
Monocytes Relative: 9 %
Neutro Abs: 4.8 10*3/uL (ref 1.7–7.7)
Neutrophils Relative %: 70 %
Platelet Count: 145 10*3/uL — ABNORMAL LOW (ref 150–400)
RBC: 4.31 MIL/uL (ref 3.87–5.11)
RDW: 13.2 % (ref 11.5–15.5)
WBC Count: 6.8 10*3/uL (ref 4.0–10.5)
nRBC: 0 % (ref 0.0–0.2)

## 2022-09-21 MED ORDER — FAM-TRASTUZUMAB DERUXTECAN-NXKI CHEMO 100 MG IV SOLR
3.2000 mg/kg | Freq: Once | INTRAVENOUS | Status: AC
  Administered 2022-09-21: 300 mg via INTRAVENOUS
  Filled 2022-09-21: qty 15

## 2022-09-21 MED ORDER — SODIUM CHLORIDE 0.9 % IV SOLN
10.0000 mg | Freq: Once | INTRAVENOUS | Status: AC
  Administered 2022-09-21: 10 mg via INTRAVENOUS
  Filled 2022-09-21: qty 10

## 2022-09-21 MED ORDER — DIPHENHYDRAMINE HCL 25 MG PO CAPS
25.0000 mg | ORAL_CAPSULE | Freq: Once | ORAL | Status: AC
  Administered 2022-09-21: 25 mg via ORAL
  Filled 2022-09-21: qty 1

## 2022-09-21 MED ORDER — ACETAMINOPHEN 325 MG PO TABS
650.0000 mg | ORAL_TABLET | Freq: Once | ORAL | Status: AC
  Administered 2022-09-21: 650 mg via ORAL
  Filled 2022-09-21: qty 2

## 2022-09-21 MED ORDER — SODIUM CHLORIDE 0.9% FLUSH
10.0000 mL | INTRAVENOUS | Status: DC | PRN
  Administered 2022-09-21: 10 mL

## 2022-09-21 MED ORDER — SODIUM CHLORIDE 0.9 % IV SOLN
150.0000 mg | Freq: Once | INTRAVENOUS | Status: AC
  Administered 2022-09-21: 150 mg via INTRAVENOUS
  Filled 2022-09-21: qty 150

## 2022-09-21 MED ORDER — DEXTROSE 5 % IV SOLN: Freq: Once | INTRAVENOUS | Status: AC

## 2022-09-21 MED ORDER — SODIUM CHLORIDE 0.9% FLUSH
10.0000 mL | Freq: Once | INTRAVENOUS | Status: AC
  Administered 2022-09-21: 10 mL

## 2022-09-21 MED ORDER — PALONOSETRON HCL INJECTION 0.25 MG/5ML
0.2500 mg | Freq: Once | INTRAVENOUS | Status: AC
  Administered 2022-09-21: 0.25 mg via INTRAVENOUS
  Filled 2022-09-21: qty 5

## 2022-09-21 MED ORDER — HEPARIN SOD (PORK) LOCK FLUSH 100 UNIT/ML IV SOLN
500.0000 [IU] | Freq: Once | INTRAVENOUS | Status: AC | PRN
  Administered 2022-09-21: 500 [IU]

## 2022-09-21 NOTE — Patient Instructions (Signed)
[facility]  Discharge Instructions: Thank you for choosing [facility] to provide your oncology and hematology care.   If you have a lab appointment with the Cancer Center, please go directly to the Cancer Center and check in at the registration area.   Wear comfortable clothing and clothing appropriate for easy access to any Portacath or PICC line.   We strive to give you quality time with your provider. You may need to reschedule your appointment if you arrive late (15 or more minutes).  Arriving late affects you and other patients whose appointments are after yours.  Also, if you miss three or more appointments without notifying the office, you may be dismissed from the clinic at the provider's discretion.      For prescription refill requests, have your pharmacy contact our office and allow 72 hours for refills to be completed.    Today you received the following chemotherapy and/or immunotherapy agents Enhertu      To help prevent nausea and vomiting after your treatment, we encourage you to take your nausea medication as directed.  BELOW ARE SYMPTOMS THAT SHOULD BE REPORTED IMMEDIATELY: *FEVER GREATER THAN 100.4 F (38 C) OR HIGHER *CHILLS OR SWEATING *NAUSEA AND VOMITING THAT IS NOT CONTROLLED WITH YOUR NAUSEA MEDICATION *UNUSUAL SHORTNESS OF BREATH *UNUSUAL BRUISING OR BLEEDING *URINARY PROBLEMS (pain or burning when urinating, or frequent urination) *BOWEL PROBLEMS (unusual diarrhea, constipation, pain near the anus) TENDERNESS IN MOUTH AND THROAT WITH OR WITHOUT PRESENCE OF ULCERS (sore throat, sores in mouth, or a toothache) UNUSUAL RASH, SWELLING OR PAIN  UNUSUAL VAGINAL DISCHARGE OR ITCHING   Items with * indicate a potential emergency and should be followed up as soon as possible or go to the Emergency Department if any problems should occur.  Please show the CHEMOTHERAPY ALERT CARD or IMMUNOTHERAPY ALERT CARD at check-in  to the Emergency Department and triage nurse.  Should you have questions after your visit or need to cancel or reschedule your appointment, please contact [facility]  Dept: [phone]  and follow the prompts.  Office hours are 8:00 a.m. to 4:30 p.m. Monday - Friday. Please note that voicemails left after 4:00 p.m. may not be returned until the following business day.  We are closed weekends and major holidays. You have access to a nurse at all times for urgent questions. Please call the main number to the clinic Dept: [phone] and follow the prompts.   For any non-urgent questions, you may also contact your provider using MyChart. We now offer e-Visits for anyone 71 and older to request care online for non-urgent symptoms. For details visit [email or web address].   Also download the MyChart app! Go to the app store, search "MyChart", open the app, select , and log in with your MyChart username and password.

## 2022-09-21 NOTE — Assessment & Plan Note (Signed)
08/04/20: Right mastectomy (Dr. 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 Neoadjuvant chemotherapy with TCH Perjeta 6 cycles followed by Kadcyla maintenance discontinued 11/11/2020 because of peripheral neuropathy 2. Followed by mastectomy with sentinel lymph node study 08/04/20 3. Followed by adjuvant radiation therapy 09/18/2020- 11/04/2020 4.  Decided not to do antiestrogen therapy because her ER was 0% on final path. 5.  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 Bone scan 08/03/2021: No definite evidence of metastatic disease  Echocardiogram 07/22/2021: EF 60 to 65%    Current treatment: Cycle 20 Enhertu Enhertu toxicities:  1. Fatigue that lasted for 3-5 days 2. nausea: Much improved 3.  Insomnia 4.  Worsening neuropathy: stable 5.  Hair thinning 6.  Diabetes: Much better control since he started Ozempic.   CT chest on July 21, 2022 demonstrating no evidence of metastatic disease progression .   Her husband was hospitalized with hypertensive urgency and was found to have an aortic aneurysm.  He is doing much better.   Diabetes: Currently on Ozempic.  Next scan will be in November. Return to clinic every 4 weeks for Enhertu.

## 2022-09-21 NOTE — Progress Notes (Signed)
Patient Care Team: Hilbert Bible, FNP as PCP - General (Family Medicine) [name], MD as Consulting Physician (General Surgery) Lance Puffer, MD as Consulting Physician (Radiation Oncology) [name], MD as Consulting Physician (Hematology and Oncology)  DIAGNOSIS:  Encounter Diagnoses  Name Primary?   Malignant neoplasm of upper-outer quadrant of right breast in female, estrogen receptor positive (HCC) Yes   Type 2 diabetes mellitus with diabetic neuropathy, without long-term current use of insulin (HCC)     SUMMARY OF ONCOLOGIC HISTORY: Oncology History  Malignant neoplasm of upper-outer quadrant of right breast in female, estrogen receptor positive (HCC)  03/10/2020 Initial Diagnosis   Palpable right breast mass x6 months.  Mammogram revealed left breast cysts (intraductal papilloma), 11 cm right breast mass and 2 masses 2.6 cm each in the right axilla: Biopsy grade 3 IDC ER 5% weak, PR 0%, Ki-67 25%, HER-2 3+ IHC positive   03/10/2020 Cancer Staging   Staging form: Breast, AJCC 8th Edition - Clinical stage from 03/10/2020: Stage IIIA (cT3, cN1, cM0, G3, ER+, PR-, HER2+) - Signed by [name], MD on 03/10/2020 Stage prefix: Initial diagnosis   03/19/2020 - 07/01/2020 Chemotherapy   TCH Perjeta   08/04/2020 Surgery   Right mastectomy (Dr. 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, high-grade DCIS with necrosis and calcifications, and 2 right axillary lymph nodes positive for metastatic carcinoma   08/04/2020 Cancer Staging   Staging form: Breast, AJCC 8th Edition - Pathologic stage from 08/04/2020: No Stage Recommended (ypTis (DCIS), pN2, cM0) - Signed by Loa Socks, NP on 02/05/2021 Stage prefix: Post-therapy   08/21/2020 - 10/23/2020 Chemotherapy   Discontinued due to peripheral neuropathy Patient is on Treatment Plan : BREAST ADO-Trastuzumab Emtansine (Kadcyla) q21d      08/2020 -  Anti-estrogen oral  therapy    Decided not to do antiestrogen therapy because her ER was 0% on final path.   10/06/2020 - 11/04/2020 Radiation Therapy   Adjuvant radiation    Relapse/Recurrence   Right mastectomy area skin changes: Initially treated for shingles but since lesions did not improve skin biopsy was obtained on 07/14/2021: Infiltrating carcinoma consistent with breast origin CK7 and GA TA 3 positive, ER 0%, PR 0%, HER2 2+ by IHC, FISH negative ratio 1.56, copy #2.1   07/26/2021 -  Chemotherapy   Patient is on Treatment Plan : BREAST METASTATIC Fam-Trastuzumab Deruxtecan-nxki (Enhertu) (5.4) q21d     01/14/2022 Imaging   CT Chest: Slight decrease in size of the right lower lobe pulmonary nodule. Stable tiny non solid nodule in the superior segment of left lower lobe. No new pulmonary nodules. Stable surgical changes from right mastectomy and axillary lymph node dissection. No new developing lymph node enlargement or other mass lesion.  Chest port. Fatty liver infiltration. Aortic Atherosclerosis (ICD10-I70.0).     CHIEF COMPLIANT: Cycle 20 Enhertu  Discussed the use of AI scribe software for clinical note transcription with the patient, who gave verbal consent to proceed.  History of Present Illness   The patient, with a history of diabetes and cancer, presents with fatigue, nausea, and neuropathy. She reports that the fatigue and nausea are most severe in the days following their cancer treatments, but improve by the fifth day. She manages the nausea with medication. The patient also experiences neuropathy, which is constant and affects her hands and feet. The neuropathy is particularly severe in their big and little toes, and causes their feet to feel cold. She manages the  neuropathy with gabapentin, taken twice daily. The patient also reports a decrease in appetite and subsequent weight loss of approximately 10 pounds since starting Ozempic for their diabetes.         ALLERGIES:  has No Known  Allergies.  MEDICATIONS:  Current Outpatient Medications  Medication Sig Dispense Refill   ACCU-CHEK GUIDE test strip See admin instructions.     ALPRAZolam (XANAX) 0.25 MG tablet Take 1 tablet (0.25 mg total) by mouth 2 (two) times daily as needed for anxiety. 60 tablet 3   blood glucose meter kit and supplies KIT Dispense based on patient and insurance preference. Use up to four times daily as directed. 1 each 0   esomeprazole (NEXIUM) 20 MG capsule Take 20 mg by mouth daily.     gabapentin (NEURONTIN) 600 MG tablet TAKE 1 TABLET BY MOUTH THREE TIMES A DAY 270 tablet 3   lidocaine-prilocaine (EMLA) cream APPLY TO AFFECTED AREA ONCE AS DIRECTED     Multiple Vitamin (MULTIVITAMIN) capsule Take 1 capsule by mouth daily.     nicotine (NICODERM CQ - DOSED IN MG/24 HOURS) 14 mg/24hr patch Place 1 patch (14 mg total) onto the skin daily. 28 patch 0   ondansetron (ZOFRAN) 24 MG tablet Take 24 mg by mouth once.     OVER THE COUNTER MEDICATION Stool softener     Semaglutide,0.25 or 0.5MG /DOS, 2 MG/3ML SOPN Inject 0.5 mg into the skin once a week. 6 mL 3   No current facility-administered medications for this visit.    PHYSICAL EXAMINATION: ECOG PERFORMANCE STATUS: 1 - Symptomatic but completely ambulatory  Vitals:   09/21/22 0943  BP: (!) 140/67  Pulse: 91  Resp: 18  Temp: (!) 97.3 F (36.3 C)  SpO2: 100%   Filed Weights   09/21/22 0943  Weight: 189 lb 4.8 oz (85.9 kg)    Physical Exam   MEASUREMENTS: WT- 3-4 pounds lost since last visit      (exam performed in the presence of a chaperone)  LABORATORY DATA:  I have reviewed the data as listed    Latest Ref Rng & Units 09/21/2022    9:25 AM 08/24/2022    7:45 AM 07/27/2022    8:31 AM  CMP  Glucose 70 - 99 mg/dL 657  846  962   BUN 8 - 23 mg/dL 8  6  6    Creatinine 0.44 - 1.00 mg/dL 9.52  8.41  3.24   Sodium 135 - 145 mmol/L 139  139  136   Potassium 3.5 - 5.1 mmol/L 3.8  3.7  3.8   Chloride 98 - 111 mmol/L 106  106  104    CO2 22 - 32 mmol/L 27  26  26    Calcium 8.9 - 10.3 mg/dL 9.7  9.4  9.4   Total Protein 6.5 - 8.1 g/dL 7.1  7.1  6.4   Total Bilirubin 0.3 - 1.2 mg/dL 0.4  0.4  0.4   Alkaline Phos 38 - 126 U/L 132  126  121   AST 15 - 41 U/L 37  38  36   ALT 0 - 44 U/L 32  35  33     Lab Results  Component Value Date   WBC 6.8 09/21/2022   HGB 13.8 09/21/2022   HCT 41.1 09/21/2022   MCV 95.4 09/21/2022   PLT 145 (L) 09/21/2022   NEUTROABS 4.8 09/21/2022    ASSESSMENT & PLAN:  Malignant neoplasm of upper-outer quadrant of right breast in female,  estrogen receptor positive (HCC) 08/04/20: Right mastectomy (Dr. 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 Neoadjuvant chemotherapy with TCH Perjeta 6 cycles followed by Kadcyla maintenance discontinued 11/11/2020 because of peripheral neuropathy 2. Followed by mastectomy with sentinel lymph node study 08/04/20 3. Followed by adjuvant radiation therapy 09/18/2020- 11/04/2020 4.  Decided not to do antiestrogen therapy because her ER was 0% on final path. 5.  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 Bone scan 08/03/2021: No definite evidence of metastatic disease  Echocardiogram 07/22/2021: EF 60 to 65%    Current treatment: Cycle 20 Enhertu Enhertu toxicities:  1. Fatigue that lasted for 3-5 days 2.  nausea: Much improved 3.  Insomnia 4.  Worsening neuropathy: stable 5.  Hair thinning 6.  Diabetes: Much better control since he started Ozempic.   CT chest on July 21, 2022 demonstrating no evidence of metastatic disease progression .   Her husband was hospitalized with hypertensive urgency and was found to have an aortic aneurysm.  He is doing much better.   Diabetes: Currently on Ozempic.  Next scan will be in November. Return to clinic every 4 weeks for Enhertu.  ------------------------------------- Assessment and Plan    Chemotherapy-induced peripheral neuropathy Persistent numbness and tingling in hands and feet, with occasional locking up of hands. Gabapentin use is inconsistent due to forgetfulness and sedation.  Chemotherapy side effects Reports of fatigue, nausea, and decreased appetite for a few days post-chemotherapy. Symptoms resolve by day 5. -Continue current management with anti-nausea medication as needed.  Weight loss Lost approximately 10 pounds since starting Ozempic. Reports improved diet and decreased appetite. -Continue Ozempic. Monitor weight and appetite.  Diabetes A1c not checked recently, but patient reports improved diet and weight loss. -Check A1c at next visit.  General Health Maintenance -Administer influenza vaccine at next infusion appointment. -Next scan scheduled for November 8th, 2024. -Next treatment scheduled for November 13th, 2024. -Check CBC and blood sugars at next visit.          Orders Placed This Encounter  Procedures   CT CHEST ABDOMEN PELVIS W CONTRAST    Standing Status:   Future    Standing Expiration Date:   09/21/2023    Order Specific Question:   If indicated for the ordered procedure, I authorize the administration of contrast media per Radiology protocol    Answer:   Yes    Order Specific Question:   Does the patient have a contrast media/X-ray dye allergy?    Answer:   No    Order Specific Question:   Preferred  imaging location?    Answer:   [facility]    Order Specific Question:   If indicated for the ordered procedure, I authorize the administration of oral contrast media per Radiology protocol    Answer:   Yes   Hemoglobin A1c    Standing Status:   Future    Standing Expiration Date:   09/21/2023   The patient has a good understanding of the  overall plan. she agrees with it. she will call with any problems that may develop before the next visit here. Total time spent: 30 mins including face to face time and time spent for planning, charting and co-ordination of care   Tamsen Meek, MD 09/21/22

## 2022-10-07 ENCOUNTER — Other Ambulatory Visit: Payer: Self-pay | Admitting: Family Medicine

## 2022-10-07 DIAGNOSIS — Z1211 Encounter for screening for malignant neoplasm of colon: Secondary | ICD-10-CM

## 2022-10-07 DIAGNOSIS — Z1212 Encounter for screening for malignant neoplasm of rectum: Secondary | ICD-10-CM

## 2022-10-18 MED FILL — Fosaprepitant Dimeglumine For IV Infusion 150 MG (Base Eq): INTRAVENOUS | Qty: 5 | Status: AC

## 2022-10-18 MED FILL — Dexamethasone Sodium Phosphate Inj 100 MG/10ML: INTRAMUSCULAR | Qty: 1 | Status: AC

## 2022-10-19 ENCOUNTER — Inpatient Hospital Stay: Payer: Medicare Other

## 2022-10-19 ENCOUNTER — Inpatient Hospital Stay: Payer: Medicare Other | Admitting: Adult Health

## 2022-10-19 ENCOUNTER — Encounter: Payer: Self-pay | Admitting: Adult Health

## 2022-10-19 ENCOUNTER — Inpatient Hospital Stay: Payer: Medicare Other | Attending: Hematology and Oncology

## 2022-10-19 DIAGNOSIS — E119 Type 2 diabetes mellitus without complications: Secondary | ICD-10-CM | POA: Insufficient documentation

## 2022-10-19 DIAGNOSIS — C773 Secondary and unspecified malignant neoplasm of axilla and upper limb lymph nodes: Secondary | ICD-10-CM | POA: Insufficient documentation

## 2022-10-19 DIAGNOSIS — Z170 Estrogen receptor positive status [ER+]: Secondary | ICD-10-CM

## 2022-10-19 DIAGNOSIS — C50411 Malignant neoplasm of upper-outer quadrant of right female breast: Secondary | ICD-10-CM

## 2022-10-19 DIAGNOSIS — Z5112 Encounter for antineoplastic immunotherapy: Secondary | ICD-10-CM | POA: Insufficient documentation

## 2022-10-19 DIAGNOSIS — Z23 Encounter for immunization: Secondary | ICD-10-CM | POA: Insufficient documentation

## 2022-10-19 DIAGNOSIS — E1140 Type 2 diabetes mellitus with diabetic neuropathy, unspecified: Secondary | ICD-10-CM

## 2022-10-19 DIAGNOSIS — R5383 Other fatigue: Secondary | ICD-10-CM

## 2022-10-19 LAB — CBC WITH DIFFERENTIAL (CANCER CENTER ONLY)
Abs Immature Granulocytes: 0.01 10*3/uL (ref 0.00–0.07)
Basophils Absolute: 0.1 10*3/uL (ref 0.0–0.1)
Basophils Relative: 1 %
Eosinophils Absolute: 0.2 10*3/uL (ref 0.0–0.5)
Eosinophils Relative: 3 %
HCT: 40.3 % (ref 36.0–46.0)
Hemoglobin: 13.8 g/dL (ref 12.0–15.0)
Immature Granulocytes: 0 %
Lymphocytes Relative: 18 %
Lymphs Abs: 1.3 10*3/uL (ref 0.7–4.0)
MCH: 32.8 pg (ref 26.0–34.0)
MCHC: 34.2 g/dL (ref 30.0–36.0)
MCV: 95.7 fL (ref 80.0–100.0)
Monocytes Absolute: 0.6 10*3/uL (ref 0.1–1.0)
Monocytes Relative: 8 %
Neutro Abs: 5 10*3/uL (ref 1.7–7.7)
Neutrophils Relative %: 70 %
Platelet Count: 146 10*3/uL — ABNORMAL LOW (ref 150–400)
RBC: 4.21 MIL/uL (ref 3.87–5.11)
RDW: 13.2 % (ref 11.5–15.5)
WBC Count: 7.2 10*3/uL (ref 4.0–10.5)
nRBC: 0 % (ref 0.0–0.2)

## 2022-10-19 LAB — CMP (CANCER CENTER ONLY)
ALT: 32 U/L (ref 0–44)
AST: 35 U/L (ref 15–41)
Albumin: 3.5 g/dL (ref 3.5–5.0)
Alkaline Phosphatase: 125 U/L (ref 38–126)
Anion gap: 10 (ref 5–15)
BUN: 8 mg/dL (ref 8–23)
CO2: 24 mmol/L (ref 22–32)
Calcium: 9.2 mg/dL (ref 8.9–10.3)
Chloride: 103 mmol/L (ref 98–111)
Creatinine: 0.64 mg/dL (ref 0.44–1.00)
GFR, Estimated: >60 mL/min (ref >60)
Glucose, Bld: 137 mg/dL — ABNORMAL HIGH (ref 70–99)
Potassium: 3.7 mmol/L (ref 3.5–5.1)
Sodium: 137 mmol/L (ref 135–145)
Total Bilirubin: 0.7 mg/dL (ref 0.3–1.2)
Total Protein: 7.1 g/dL (ref 6.5–8.1)

## 2022-10-19 LAB — HEMOGLOBIN A1C
Hgb A1c MFr Bld: 6.6 % — ABNORMAL HIGH (ref 4.8–5.6)
Mean Plasma Glucose: 142.72 mg/dL

## 2022-10-19 MED ORDER — FAM-TRASTUZUMAB DERUXTECAN-NXKI CHEMO 100 MG IV SOLR
3.2000 mg/kg | Freq: Once | INTRAVENOUS | Status: AC
  Administered 2022-10-19: 300 mg via INTRAVENOUS
  Filled 2022-10-19: qty 15

## 2022-10-19 MED ORDER — INFLUENZA VAC A&B SURF ANT ADJ 0.5 ML IM SUSY
0.5000 mL | PREFILLED_SYRINGE | Freq: Once | INTRAMUSCULAR | Status: AC
  Administered 2022-10-19: 0.5 mL via INTRAMUSCULAR
  Filled 2022-10-19: qty 0.5

## 2022-10-19 MED ORDER — SODIUM CHLORIDE 0.9% FLUSH
10.0000 mL | Freq: Once | INTRAVENOUS | Status: AC
  Administered 2022-10-19: 10 mL

## 2022-10-19 MED ORDER — SODIUM CHLORIDE 0.9 % IV SOLN
10.0000 mg | Freq: Once | INTRAVENOUS | Status: AC
  Administered 2022-10-19: 10 mg via INTRAVENOUS
  Filled 2022-10-19: qty 10

## 2022-10-19 MED ORDER — HEPARIN SOD (PORK) LOCK FLUSH 100 UNIT/ML IV SOLN
500.0000 [IU] | Freq: Once | INTRAVENOUS | Status: AC | PRN
  Administered 2022-10-19: 500 [IU]

## 2022-10-19 MED ORDER — DEXTROSE 5 % IV SOLN: Freq: Once | INTRAVENOUS | Status: AC

## 2022-10-19 MED ORDER — SODIUM CHLORIDE 0.9 % IV SOLN
150.0000 mg | Freq: Once | INTRAVENOUS | Status: AC
  Administered 2022-10-19: 150 mg via INTRAVENOUS
  Filled 2022-10-19: qty 150

## 2022-10-19 MED ORDER — DIPHENHYDRAMINE HCL 25 MG PO CAPS
25.0000 mg | ORAL_CAPSULE | Freq: Once | ORAL | Status: AC
  Administered 2022-10-19: 25 mg via ORAL
  Filled 2022-10-19: qty 1

## 2022-10-19 MED ORDER — PALONOSETRON HCL INJECTION 0.25 MG/5ML
0.2500 mg | Freq: Once | INTRAVENOUS | Status: AC
  Administered 2022-10-19: 0.25 mg via INTRAVENOUS
  Filled 2022-10-19: qty 5

## 2022-10-19 MED ORDER — SODIUM CHLORIDE 0.9% FLUSH
10.0000 mL | INTRAVENOUS | Status: DC | PRN
  Administered 2022-10-19: 10 mL

## 2022-10-19 MED ORDER — ACETAMINOPHEN 325 MG PO TABS
650.0000 mg | ORAL_TABLET | Freq: Once | ORAL | Status: AC
  Administered 2022-10-19: 650 mg via ORAL
  Filled 2022-10-19: qty 2

## 2022-10-19 NOTE — Progress Notes (Signed)
[facility] Cancer Follow up:    Hilbert Bible, FNP [address] [place] [address]   DIAGNOSIS:  Cancer Staging  Malignant neoplasm of upper-outer quadrant of right breast in female, estrogen receptor positive (HCC) Staging form: Breast, AJCC 8th Edition - Clinical stage from 03/10/2020: Stage IIIA (cT3, cN1, cM0, G3, ER+, PR-, HER2+) - Signed by [name], MD on 03/10/2020 Stage prefix: Initial diagnosis - Pathologic stage from 08/04/2020: No Stage Recommended (ypTis (DCIS), pN2, cM0) - Signed by Loa Socks, NP on 02/05/2021 Stage prefix: Post-therapy   SUMMARY OF ONCOLOGIC HISTORY: Oncology History  Malignant neoplasm of upper-outer quadrant of right breast in female, estrogen receptor positive (HCC)  03/10/2020 Initial Diagnosis   Palpable right breast mass x6 months.  Mammogram revealed left breast cysts (intraductal papilloma), 11 cm right breast mass and 2 masses 2.6 cm each in the right axilla: Biopsy grade 3 IDC ER 5% weak, PR 0%, Ki-67 25%, HER-2 3+ IHC positive   03/10/2020 Cancer Staging   Staging form: Breast, AJCC 8th Edition - Clinical stage from 03/10/2020: Stage IIIA (cT3, cN1, cM0, G3, ER+, PR-, HER2+) - Signed by [name], MD on 03/10/2020 Stage prefix: Initial diagnosis   03/19/2020 - 07/01/2020 Chemotherapy   TCH Perjeta   08/04/2020 Surgery   Right mastectomy (Dr. 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, high-grade DCIS with necrosis and calcifications, and 2 right axillary lymph nodes positive for metastatic carcinoma   08/04/2020 Cancer Staging   Staging form: Breast, AJCC 8th Edition - Pathologic stage from 08/04/2020: No Stage Recommended (ypTis (DCIS), pN2, cM0) - Signed by Loa Socks, NP on 02/05/2021 Stage prefix: Post-therapy   08/21/2020 - 10/23/2020 Chemotherapy   Discontinued due to peripheral neuropathy Patient is on Treatment Plan :  BREAST ADO-Trastuzumab Emtansine (Kadcyla) q21d      08/2020 -  Anti-estrogen oral therapy    Decided not to do antiestrogen therapy because her ER was 0% on final path.   10/06/2020 - 11/04/2020 Radiation Therapy   Adjuvant radiation    Relapse/Recurrence   Right mastectomy area skin changes: Initially treated for shingles but since lesions did not improve skin biopsy was obtained on 07/14/2021: Infiltrating carcinoma consistent with breast origin CK7 and GA TA 3 positive, ER 0%, PR 0%, HER2 2+ by IHC, FISH negative ratio 1.56, copy #2.1   07/26/2021 -  Chemotherapy   Patient is on Treatment Plan : BREAST METASTATIC Fam-Trastuzumab Deruxtecan-nxki (Enhertu) (5.4) q21d     01/14/2022 Imaging   CT Chest: Slight decrease in size of the right lower lobe pulmonary nodule. Stable tiny non solid nodule in the superior segment of left lower lobe. No new pulmonary nodules. Stable surgical changes from right mastectomy and axillary lymph node dissection. No new developing lymph node enlargement or other mass lesion.  Chest port. Fatty liver infiltration. Aortic Atherosclerosis (ICD10-I70.0).     CURRENT THERAPY: Enhertu  INTERVAL HISTORY: BRANDILYNN STOCKBURGER 67 y.o. female returns for f/u prior to receiving her next cycle of Enhertu.  She tolerates it quite well.  She experiences fatigue the first few days after treatment, but otherwise tolerates it quite well.  She denies any new concerns today.    Restaging CT CAP scheduled for 11/11/2022.  Her most recent echocardiogram occurred on August 17, 2022 demonstrating a left ventricular ejection fraction of 60 to 65%.  Patient Active Problem List   Diagnosis Date Noted   Encounter to  establish care with new doctor 07/14/2022   Screening for colon cancer 07/14/2022   Encounter for smoking cessation counseling 07/14/2022   Essential (primary) hypertension 01/19/2022   Type 2 diabetes mellitus without complication, without long-term current use of insulin  (HCC) 01/19/2022   Fatigue 04/08/2021   S/P mastectomy, right 08/04/2020   Chemotherapy-induced peripheral neuropathy (HCC) 05/20/2020   Malignant neoplasm of upper-outer quadrant of right breast in female, estrogen receptor positive (HCC) 03/10/2020   Fibroid    Smoker    Depression     has No Known Allergies.  MEDICAL HISTORY: Past Medical History:  Diagnosis Date   Acid reflux    Anxiety    Breast cancer (HCC)    Breast cancer (HCC) 2022   Depression    HISTORY OF   Fibroid    PONV (postoperative nausea and vomiting)    Smoker     SURGICAL HISTORY: Past Surgical History:  Procedure Laterality Date   CESAREAN SECTION     X 2   MASTECTOMY     MASTECTOMY WITH AXILLARY LYMPH NODE DISSECTION Right 08/04/2020   Procedure: RIGHT MASTECTOMY WITH RIGHT AXILLARY LYMPH NODE DISSECTION;  Surgeon: [name], MD;  Location: [facility] OR;  Service: General;  Laterality: Right;   PORTACATH PLACEMENT Left 03/17/2020   Procedure: INSERTION PORT-A-CATH;  Surgeon: [name], MD;  Location: [facility];  Service: General;  Laterality: Left;   PORTACATH PLACEMENT Left 08/05/2021   Procedure: PORT PLACEMENT WITH ULTRASOUND GUIDANCE;  Surgeon: [name], MD;  Location: [facility];  Service: General;  Laterality: Left;  LMA    SOCIAL HISTORY: Social History   Socioeconomic History   Marital status: Married    Spouse name: Not on file   Number of children: Not on file   Years of education: Not on file   Highest education level: Some college, no degree  Occupational History   Not on file  Tobacco Use   Smoking status: Every Day    Current packs/day: 1.00    Average packs/day: 1 pack/day for 49.8 years (49.8 ttl pk-yrs)    Types: Cigarettes    Start date: 1975   Smokeless tobacco: Never   Tobacco comments:    Using nicotine patch; currently smoking 2-3 cigs per day as of 07/14/22 ep  Vaping Use   Vaping status: Never Used  Substance  and Sexual Activity   Alcohol use: Not Currently   Drug use: Never   Sexual activity: Yes    Birth control/protection: Post-menopausal  Other Topics Concern   Not on file  Social History Narrative   Right handed    Caffeine- 5-6 cups per day    Social Determinants of Health   Financial Resource Strain: Low Risk  (08/01/2022)   Overall Financial Resource Strain (CARDIA)    Difficulty of Paying Living Expenses: Not hard at all  Food Insecurity: No Food Insecurity (08/01/2022)   Hunger Vital Sign    Worried About Running Out of Food in the Last Year: Never true    Ran Out of Food in the Last Year: Never true  Transportation Needs: No Transportation Needs (08/01/2022)   PRAPARE - Administrator, Civil Service (Medical): No    Lack of Transportation (Non-Medical): No  Physical Activity: Insufficiently Active (08/01/2022)   Exercise Vital Sign    Days of Exercise per Week: 3 days    Minutes of Exercise per Session: 20 min  Stress: No Stress Concern Present (08/01/2022)  [employer] of Occupational Health - Occupational Stress Questionnaire    Feeling of Stress : Only a little  Social Connections: Moderately Isolated (08/01/2022)   Social Connection and Isolation Panel [NHANES]    Frequency of Communication with Friends and Family: More than three times a week    Frequency of Social Gatherings with Friends and Family: Twice a week    Attends Religious Services: Never    Database administrator or Organizations: No    Attends Engineer, structural: Not on file    Marital Status: Married  Catering manager Violence: Not on file    FAMILY HISTORY: Family History  Problem Relation Age of Onset   Cancer Mother    Breast cancer Mother    Diabetes Mother    Hypertension Mother    Hypertension Father    Hypertension Sister    Hypertension Brother     Review of Systems  Constitutional:  Positive for fatigue. Negative for appetite change, chills, fever and  unexpected weight change.  HENT:   Negative for hearing loss, lump/mass and trouble swallowing.   Eyes:  Negative for eye problems and icterus.  Respiratory:  Negative for chest tightness, cough and shortness of breath.   Cardiovascular:  Negative for chest pain, leg swelling and palpitations.  Gastrointestinal:  Negative for abdominal distention, abdominal pain, constipation, diarrhea, nausea and vomiting.  Endocrine: Negative for hot flashes.  Genitourinary:  Negative for difficulty urinating.   Musculoskeletal:  Negative for arthralgias.  Skin:  Negative for itching and rash.  Neurological:  Negative for dizziness, extremity weakness, headaches and numbness.  Hematological:  Negative for adenopathy. Does not bruise/bleed easily.  Psychiatric/Behavioral:  Negative for depression. The patient is not nervous/anxious.       PHYSICAL EXAMINATION    Vitals:   10/19/22 0944  BP: 133/69  Pulse: 87  Resp: 18  Temp: 97.7 F (36.5 C)  SpO2: 98%    Physical Exam Constitutional:      General: She is not in acute distress.    Appearance: Normal appearance. She is not toxic-appearing.  HENT:     Head: Normocephalic and atraumatic.     Mouth/Throat:     Mouth: Mucous membranes are moist.     Pharynx: Oropharynx is clear. No oropharyngeal exudate or posterior oropharyngeal erythema.  Eyes:     General: No scleral icterus. Cardiovascular:     Rate and Rhythm: Normal rate and regular rhythm.     Pulses: Normal pulses.     Heart sounds: Normal heart sounds.  Pulmonary:     Effort: Pulmonary effort is normal.     Breath sounds: Normal breath sounds.  Abdominal:     General: Abdomen is flat. Bowel sounds are normal. There is no distension.     Palpations: Abdomen is soft.     Tenderness: There is no abdominal tenderness.  Musculoskeletal:        General: No swelling.     Cervical back: Neck supple.  Lymphadenopathy:     Cervical: No cervical adenopathy.  Skin:    General: Skin  is warm and dry.     Findings: No rash.  Neurological:     General: No focal deficit present.     Mental Status: She is alert.  Psychiatric:        Mood and Affect: Mood normal.        Behavior: Behavior normal.     LABORATORY DATA:  CBC    Component Value Date/Time  WBC 7.2 10/19/2022 0921   WBC 6.8 08/18/2021 0934   RBC 4.21 10/19/2022 0921   HGB 13.8 10/19/2022 0921   HCT 40.3 10/19/2022 0921   PLT 146 (L) 10/19/2022 0921   MCV 95.7 10/19/2022 0921   MCH 32.8 10/19/2022 0921   MCHC 34.2 10/19/2022 0921   RDW 13.2 10/19/2022 0921   LYMPHSABS 1.3 10/19/2022 0921   MONOABS 0.6 10/19/2022 0921   EOSABS 0.2 10/19/2022 0921   BASOSABS 0.1 10/19/2022 0921    CMP     Component Value Date/Time   NA 137 10/19/2022 0921   K 3.7 10/19/2022 0921   CL 103 10/19/2022 0921   CO2 24 10/19/2022 0921   GLUCOSE 137 (H) 10/19/2022 0921   BUN 8 10/19/2022 0921   CREATININE 0.64 10/19/2022 0921   CALCIUM 9.2 10/19/2022 0921   PROT 7.1 10/19/2022 0921   ALBUMIN 3.5 10/19/2022 0921   AST 35 10/19/2022 0921   ALT 32 10/19/2022 0921   ALKPHOS 125 10/19/2022 0921   BILITOT 0.7 10/19/2022 0921   GFRNONAA >60 10/19/2022 0921         ASSESSMENT and THERAPY PLAN:   Malignant neoplasm of upper-outer quadrant of right breast in female, estrogen receptor positive (HCC) 08/04/20: Right mastectomy (Dr. 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 Neoadjuvant chemotherapy with TCH Perjeta 6 cycles followed by Kadcyla maintenance discontinued 11/11/2020 because of peripheral neuropathy 2. Followed by mastectomy with sentinel lymph node study 08/04/20 3. Followed by adjuvant radiation therapy 09/18/2020- 11/04/2020 4.  Decided not to do antiestrogen therapy because her ER was 0% on final path. 5.  Cutaneous  metastases 07/14/2021 6.  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 Bone scan 08/03/2021: No definite evidence of metastatic disease  CT chest on July 21, 2022 demonstrating no evidence of metastatic disease progression   Current treatment: Enhertu Enhertu toxicities:  1. Fatigue: Managed with energy conservation 2.  Constipation: Resolved 3. Diabetes: On Ozempic.    Repeating imaging 11/11/2022 for restaging.  RTC in 3 weeks for labs, f/u, and her next treatment.    All questions were answered. The patient knows to call the clinic with any problems, questions or concerns. We can certainly see the patient much sooner if necessary.  Total encounter time:20 minutes*in face-to-face visit time, chart review, lab review, care coordination, order entry, and documentation of the encounter time.    Lillard Anes, NP 10/21/22 7:21 PM Medical Oncology and Hematology [facility] [address] [place], [place] [address] Tel. [phone]    Fax. [phone]  *Total Encounter Time as defined by the Centers for Medicare and Medicaid Services includes, in addition to the face-to-face time of a patient visit (documented in the note above) non-face-to-face time: obtaining and reviewing outside history, ordering and reviewing medications, tests or procedures, care coordination (communications with other health care professionals or caregivers) and documentation in the medical record.

## 2022-10-19 NOTE — Patient Instructions (Signed)
Influenza (Flu) Vaccine (Inactivated or Recombinant): What You Need to Know Many vaccine information statements are available in Spanish and other languages. See PromoAge.com.br. 1. Why get vaccinated? Influenza vaccine can prevent influenza (flu). Flu is a contagious disease that spreads around the [place] every year, usually between October and May. Anyone can get the flu, but it is more dangerous for some people. Infants and young children, people 63 years and older, pregnant people, and people with certain health conditions or a weakened immune system are at greatest risk of flu complications. Pneumonia, bronchitis, sinus infections, and ear infections are examples of flu-related complications. If you have a medical condition, such as heart disease, cancer, or diabetes, flu can make it worse. Flu can cause fever and chills, sore throat, muscle aches, fatigue, cough, headache, and runny or stuffy nose. Some people may have vomiting and diarrhea, though this is more common in children than adults. In an average year, thousands of people in the [place] die from flu, and many more are hospitalized. Flu vaccine prevents millions of illnesses and flu-related visits to the doctor each year. 2. Influenza vaccines CDC recommends everyone 6 months and older get vaccinated every flu season. Children 6 months through 60 years of age may need 2 doses during a single flu season. Everyone else needs only 1 dose each flu season. It takes about 2 weeks for protection to develop after vaccination. There are many flu viruses, and they are always changing. Each year a new flu vaccine is made to protect against the influenza viruses believed to be likely to cause disease in the upcoming flu season. Even when the vaccine doesn't exactly match these viruses, it may still provide some protection. Influenza vaccine does not cause flu. Influenza vaccine may be given at the same time as other vaccines. 3.  Talk with your health care provider Tell your vaccination provider if the person getting the vaccine: Has had an allergic reaction after a previous dose of influenza vaccine, or has any severe, life-threatening allergies Has ever had Guillain-Barr Syndrome (also called "GBS") In some cases, your health care provider may decide to postpone influenza vaccination until a future visit. Influenza vaccine can be administered at any time during pregnancy. People who are or will be pregnant during influenza season should receive inactivated influenza vaccine. People with minor illnesses, such as a cold, may be vaccinated. People who are moderately or severely ill should usually wait until they recover before getting influenza vaccine. Your health care provider can give you more information. 4. Risks of a vaccine reaction Soreness, redness, and swelling where the shot is given, fever, muscle aches, and headache can happen after influenza vaccination. There may be a very small increased risk of Guillain-Barr Syndrome (GBS) after inactivated influenza vaccine (the flu shot). Young children who get the flu shot along with pneumococcal vaccine (PCV13) and/or DTaP vaccine at the same time might be slightly more likely to have a seizure caused by fever. Tell your health care provider if a child who is getting flu vaccine has ever had a seizure. People sometimes faint after medical procedures, including vaccination. Tell your provider if you feel dizzy or have vision changes or ringing in the ears. As with any medicine, there is a very remote chance of a vaccine causing a severe allergic reaction, other serious injury, or death. 5. What if there is a serious problem? An allergic reaction could occur after the vaccinated person leaves the clinic. If you see signs of  a severe allergic reaction (hives, swelling of the face and throat, difficulty breathing, a fast heartbeat, dizziness, or weakness), call 9-1-1 and get  the person to the nearest hospital. For other signs that concern you, call your health care provider. Adverse reactions should be reported to the Vaccine Adverse Event Reporting System (VAERS). Your health care provider will usually file this report, or you can do it yourself. Visit the VAERS website at www.vaers.LAgents.no or call (229)292-2109. VAERS is only for reporting reactions, and VAERS staff members do not give medical advice. 6. The National Vaccine Injury Compensation Program The [employer] Vaccine Injury Compensation Program (VICP) is a federal program that was created to compensate people who may have been injured by certain vaccines. Claims regarding alleged injury or death due to vaccination have a time limit for filing, which may be as short as two years. Visit the VICP website at SpiritualWord.at or call 564-113-5898 to learn about the program and about filing a claim. 7. How can I learn more? Ask your health care provider. Call your local or state health department. Visit the website of the Food and Drug Administration (FDA) for vaccine package inserts and additional information at FinderList.no. Contact the Centers for Disease Control and Prevention (CDC): Call 203-440-9734 (1-800-CDC-INFO) or Visit CDC's website at BiotechRoom.com.cy. Source: CDC Vaccine Information Statement Inactivated Influenza Vaccine (08/09/2019) This same material is available at FootballExhibition.com.br for no charge. This information is not intended to replace advice given to you by your health care provider. Make sure you discuss any questions you have with your health care provider. Document Revised: 04/06/2022 Document Reviewed: 01/10/2022 Elsevier Patient Education  2024 Elsevier Inc.  [facility]  Discharge Instructions: Thank you for choosing [facility] to provide your oncology and hematology care.   If you have  a lab appointment with the Cancer Center, please go directly to the Cancer Center and check in at the registration area.   Wear comfortable clothing and clothing appropriate for easy access to any Portacath or PICC line.   We strive to give you quality time with your provider. You may need to reschedule your appointment if you arrive late (15 or more minutes).  Arriving late affects you and other patients whose appointments are after yours.  Also, if you miss three or more appointments without notifying the office, you may be dismissed from the clinic at the provider's discretion.      For prescription refill requests, have your pharmacy contact our office and allow 72 hours for refills to be completed.    Today you received the following chemotherapy and/or immunotherapy agents: Enhertu      To help prevent nausea and vomiting after your treatment, we encourage you to take your nausea medication as directed.  BELOW ARE SYMPTOMS THAT SHOULD BE REPORTED IMMEDIATELY: *FEVER GREATER THAN 100.4 F (38 C) OR HIGHER *CHILLS OR SWEATING *NAUSEA AND VOMITING THAT IS NOT CONTROLLED WITH YOUR NAUSEA MEDICATION *UNUSUAL SHORTNESS OF BREATH *UNUSUAL BRUISING OR BLEEDING *URINARY PROBLEMS (pain or burning when urinating, or frequent urination) *BOWEL PROBLEMS (unusual diarrhea, constipation, pain near the anus) TENDERNESS IN MOUTH AND THROAT WITH OR WITHOUT PRESENCE OF ULCERS (sore throat, sores in mouth, or a toothache) UNUSUAL RASH, SWELLING OR PAIN  UNUSUAL VAGINAL DISCHARGE OR ITCHING   Items with * indicate a potential emergency and should be followed up as soon as possible or go to the Emergency Department if any problems should occur.  Please show  the CHEMOTHERAPY ALERT CARD or IMMUNOTHERAPY ALERT CARD at check-in to the Emergency Department and triage nurse.  Should you have questions after your visit or need to cancel or reschedule your appointment, please contact [place] [facility]  [facility]  Dept: [phone]  and follow the prompts.  Office hours are 8:00 a.m. to 4:30 p.m. Monday - Friday. Please note that voicemails left after 4:00 p.m. may not be returned until the following business day.  We are closed weekends and major holidays. You have access to a nurse at all times for urgent questions. Please call the main number to the clinic Dept: [phone] and follow the prompts.   For any non-urgent questions, you may also contact your provider using MyChart. We now offer e-Visits for anyone 40 and older to request care online for non-urgent symptoms. For details visit [email or web address].   Also download the MyChart app! Go to the app store, search "MyChart", open the app, select [facility], and log in with your MyChart username and password.

## 2022-10-20 ENCOUNTER — Telehealth: Payer: Self-pay | Admitting: *Deleted

## 2022-10-20 NOTE — Telephone Encounter (Signed)
RN placed call to pt with below information.  Pt verbalized understanding.

## 2022-10-20 NOTE — Telephone Encounter (Signed)
-----   Message from Noreene Filbert sent at 10/20/2022  1:24 PM EDT ----- Please call Lupita Leash and let her know I am over here doing a happy dance about her hemoglobin A1c.  Lol.  It looks great at 6.6.  Our goal is less than 7. ----- Message ----- From: Interface, Lab In [place] Sent: 10/19/2022   9:37 AM EDT To: [name], MD

## 2022-10-21 ENCOUNTER — Encounter: Payer: Self-pay | Admitting: Hematology and Oncology

## 2022-10-21 NOTE — Assessment & Plan Note (Signed)
08/04/20: Right mastectomy (Dr. 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 Neoadjuvant chemotherapy with TCH Perjeta 6 cycles followed by Kadcyla maintenance discontinued 11/11/2020 because of peripheral neuropathy 2. Followed by mastectomy with sentinel lymph node study 08/04/20 3. Followed by adjuvant radiation therapy 09/18/2020- 11/04/2020 4.  Decided not to do antiestrogen therapy because her ER was 0% on final path. 5.  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 Bone scan 08/03/2021: No definite evidence of metastatic disease  CT chest on July 21, 2022 demonstrating no evidence of metastatic disease progression   Current treatment: Enhertu Enhertu toxicities:  1. Fatigue: Managed with energy conservation 2.  Constipation: Resolved 3. Diabetes: On Ozempic.    Repeating imaging 11/11/2022 for restaging.  RTC in 3 weeks for labs, f/u, and her next treatment.

## 2022-11-03 ENCOUNTER — Other Ambulatory Visit (HOSPITAL_BASED_OUTPATIENT_CLINIC_OR_DEPARTMENT_OTHER): Payer: Self-pay

## 2022-11-11 ENCOUNTER — Encounter (HOSPITAL_COMMUNITY): Payer: Self-pay

## 2022-11-11 ENCOUNTER — Ambulatory Visit (HOSPITAL_COMMUNITY)
Admission: RE | Admit: 2022-11-11 | Discharge: 2022-11-11 | Disposition: A | Discharge status: Home / Self Care | Payer: Medicare Other | Source: Ambulatory Visit | Attending: Hematology and Oncology | Admitting: Hematology and Oncology

## 2022-11-11 DIAGNOSIS — Z170 Estrogen receptor positive status [ER+]: Secondary | ICD-10-CM | POA: Diagnosis present

## 2022-11-11 DIAGNOSIS — C50411 Malignant neoplasm of upper-outer quadrant of right female breast: Secondary | ICD-10-CM | POA: Diagnosis present

## 2022-11-11 MED ORDER — HEPARIN SOD (PORK) LOCK FLUSH 100 UNIT/ML IV SOLN
INTRAVENOUS | Status: AC
  Filled 2022-11-11: qty 5

## 2022-11-11 MED ORDER — HEPARIN SOD (PORK) LOCK FLUSH 100 UNIT/ML IV SOLN
500.0000 [IU] | Freq: Once | INTRAVENOUS | Status: AC
  Administered 2022-11-11: 500 [IU] via INTRAVENOUS

## 2022-11-11 MED ORDER — IOHEXOL 300 MG/ML  SOLN
100.0000 mL | Freq: Once | INTRAMUSCULAR | Status: AC | PRN
  Administered 2022-11-11: 100 mL via INTRAVENOUS

## 2022-11-14 MED FILL — Fosaprepitant Dimeglumine For IV Infusion 150 MG (Base Eq): INTRAVENOUS | Qty: 5 | Status: AC

## 2022-11-15 ENCOUNTER — Inpatient Hospital Stay: Payer: Medicare Other | Attending: Hematology and Oncology

## 2022-11-15 ENCOUNTER — Inpatient Hospital Stay: Payer: Medicare Other

## 2022-11-15 ENCOUNTER — Inpatient Hospital Stay (HOSPITAL_BASED_OUTPATIENT_CLINIC_OR_DEPARTMENT_OTHER): Payer: Medicare Other | Admitting: Hematology and Oncology

## 2022-11-15 VITALS — BP 141/68 | HR 101 | Temp 97.5°F | Resp 18 | Ht 66.0 in | Wt 187.9 lb

## 2022-11-15 VITALS — BP 157/73 | HR 81

## 2022-11-15 DIAGNOSIS — Z170 Estrogen receptor positive status [ER+]: Secondary | ICD-10-CM | POA: Diagnosis not present

## 2022-11-15 DIAGNOSIS — Z5112 Encounter for antineoplastic immunotherapy: Secondary | ICD-10-CM | POA: Insufficient documentation

## 2022-11-15 DIAGNOSIS — C50411 Malignant neoplasm of upper-outer quadrant of right female breast: Secondary | ICD-10-CM | POA: Diagnosis not present

## 2022-11-15 DIAGNOSIS — G620 Drug-induced polyneuropathy: Secondary | ICD-10-CM | POA: Diagnosis not present

## 2022-11-15 DIAGNOSIS — C773 Secondary and unspecified malignant neoplasm of axilla and upper limb lymph nodes: Secondary | ICD-10-CM | POA: Diagnosis not present

## 2022-11-15 DIAGNOSIS — E119 Type 2 diabetes mellitus without complications: Secondary | ICD-10-CM | POA: Diagnosis not present

## 2022-11-15 LAB — CBC WITH DIFFERENTIAL (CANCER CENTER ONLY)
Abs Immature Granulocytes: 0.03 10*3/uL (ref 0.00–0.07)
Basophils Absolute: 0.1 10*3/uL (ref 0.0–0.1)
Basophils Relative: 1 %
Eosinophils Absolute: 0.1 10*3/uL (ref 0.0–0.5)
Eosinophils Relative: 1 %
HCT: 39.7 % (ref 36.0–46.0)
Hemoglobin: 13.7 g/dL (ref 12.0–15.0)
Immature Granulocytes: 1 %
Lymphocytes Relative: 17 %
Lymphs Abs: 1.1 10*3/uL (ref 0.7–4.0)
MCH: 32.5 pg (ref 26.0–34.0)
MCHC: 34.5 g/dL (ref 30.0–36.0)
MCV: 94.1 fL (ref 80.0–100.0)
Monocytes Absolute: 0.5 10*3/uL (ref 0.1–1.0)
Monocytes Relative: 7 %
Neutro Abs: 4.9 10*3/uL (ref 1.7–7.7)
Neutrophils Relative %: 73 %
Platelet Count: 147 10*3/uL — ABNORMAL LOW (ref 150–400)
RBC: 4.22 MIL/uL (ref 3.87–5.11)
RDW: 13.4 % (ref 11.5–15.5)
WBC Count: 6.6 10*3/uL (ref 4.0–10.5)
nRBC: 0 % (ref 0.0–0.2)

## 2022-11-15 LAB — CMP (CANCER CENTER ONLY)
ALT: 29 U/L (ref 0–44)
AST: 31 U/L (ref 15–41)
Albumin: 3.9 g/dL (ref 3.5–5.0)
Alkaline Phosphatase: 134 U/L — ABNORMAL HIGH (ref 38–126)
Anion gap: 7 (ref 5–15)
BUN: 8 mg/dL (ref 8–23)
CO2: 25 mmol/L (ref 22–32)
Calcium: 9.3 mg/dL (ref 8.9–10.3)
Chloride: 105 mmol/L (ref 98–111)
Creatinine: 0.78 mg/dL (ref 0.44–1.00)
GFR, Estimated: >60 mL/min (ref >60)
Glucose, Bld: 212 mg/dL — ABNORMAL HIGH (ref 70–99)
Potassium: 3.8 mmol/L (ref 3.5–5.1)
Sodium: 137 mmol/L (ref 135–145)
Total Bilirubin: 0.5 mg/dL (ref <1.2)
Total Protein: 6.8 g/dL (ref 6.5–8.1)

## 2022-11-15 MED ORDER — FAM-TRASTUZUMAB DERUXTECAN-NXKI CHEMO 100 MG IV SOLR
3.2000 mg/kg | Freq: Once | INTRAVENOUS | Status: AC
  Administered 2022-11-15: 300 mg via INTRAVENOUS
  Filled 2022-11-15: qty 15

## 2022-11-15 MED ORDER — HEPARIN SOD (PORK) LOCK FLUSH 100 UNIT/ML IV SOLN
500.0000 [IU] | Freq: Once | INTRAVENOUS | Status: AC | PRN
  Administered 2022-11-15: 500 [IU]

## 2022-11-15 MED ORDER — PALONOSETRON HCL INJECTION 0.25 MG/5ML
0.2500 mg | Freq: Once | INTRAVENOUS | Status: AC
  Administered 2022-11-15: 0.25 mg via INTRAVENOUS
  Filled 2022-11-15: qty 5

## 2022-11-15 MED ORDER — DIPHENHYDRAMINE HCL 25 MG PO CAPS
25.0000 mg | ORAL_CAPSULE | Freq: Once | ORAL | Status: AC
  Administered 2022-11-15: 25 mg via ORAL
  Filled 2022-11-15: qty 1

## 2022-11-15 MED ORDER — SODIUM CHLORIDE 0.9 % IV SOLN
150.0000 mg | Freq: Once | INTRAVENOUS | Status: AC
  Administered 2022-11-15: 150 mg via INTRAVENOUS
  Filled 2022-11-15: qty 150

## 2022-11-15 MED ORDER — PROCHLORPERAZINE MALEATE 10 MG PO TABS
10.0000 mg | ORAL_TABLET | Freq: Four times a day (QID) | ORAL | 3 refills | Status: DC | PRN
Start: 2022-11-15 — End: 2023-08-01

## 2022-11-15 MED ORDER — DEXAMETHASONE SODIUM PHOSPHATE 10 MG/ML IJ SOLN
10.0000 mg | Freq: Once | INTRAMUSCULAR | Status: AC
  Administered 2022-11-15: 10 mg via INTRAVENOUS
  Filled 2022-11-15: qty 1

## 2022-11-15 MED ORDER — ACETAMINOPHEN 325 MG PO TABS
650.0000 mg | ORAL_TABLET | Freq: Once | ORAL | Status: AC
  Administered 2022-11-15: 650 mg via ORAL
  Filled 2022-11-15: qty 2

## 2022-11-15 MED ORDER — LIDOCAINE-PRILOCAINE 2.5-2.5 % EX CREA: TOPICAL_CREAM | Freq: Once | CUTANEOUS | 3 refills | Status: AC

## 2022-11-15 MED ORDER — SODIUM CHLORIDE 0.9% FLUSH
10.0000 mL | INTRAVENOUS | Status: DC | PRN
  Administered 2022-11-15: 10 mL

## 2022-11-15 MED ORDER — DEXTROSE 5 % IV SOLN: Freq: Once | INTRAVENOUS | Status: AC

## 2022-11-15 NOTE — Progress Notes (Signed)
Patient Care Team: Hilbert Bible, FNP as PCP - General (Family Medicine) [name], MD as Consulting Physician (General Surgery) Courtenay Puffer, MD as Consulting Physician (Radiation Oncology) [name], MD as Consulting Physician (Hematology and Oncology)  DIAGNOSIS:  Encounter Diagnosis  Name Primary?   Malignant neoplasm of upper-outer quadrant of right breast in female, estrogen receptor positive (HCC) Yes    SUMMARY OF ONCOLOGIC HISTORY: Oncology History  Malignant neoplasm of upper-outer quadrant of right breast in female, estrogen receptor positive (HCC)  03/10/2020 Initial Diagnosis   Palpable right breast mass x6 months.  Mammogram revealed left breast cysts (intraductal papilloma), 11 cm right breast mass and 2 masses 2.6 cm each in the right axilla: Biopsy grade 3 IDC ER 5% weak, PR 0%, Ki-67 25%, HER-2 3+ IHC positive   03/10/2020 Cancer Staging   Staging form: Breast, AJCC 8th Edition - Clinical stage from 03/10/2020: Stage IIIA (cT3, cN1, cM0, G3, ER+, PR-, HER2+) - Signed by [name], MD on 03/10/2020 Stage prefix: Initial diagnosis   03/19/2020 - 07/01/2020 Chemotherapy   TCH Perjeta   08/04/2020 Surgery   Right mastectomy (Dr. 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, high-grade DCIS with necrosis and calcifications, and 2 right axillary lymph nodes positive for metastatic carcinoma   08/04/2020 Cancer Staging   Staging form: Breast, AJCC 8th Edition - Pathologic stage from 08/04/2020: No Stage Recommended (ypTis (DCIS), pN2, cM0) - Signed by Loa Socks, NP on 02/05/2021 Stage prefix: Post-therapy   08/21/2020 - 10/23/2020 Chemotherapy   Discontinued due to peripheral neuropathy Patient is on Treatment Plan : BREAST ADO-Trastuzumab Emtansine (Kadcyla) q21d      08/2020 -  Anti-estrogen oral therapy    Decided not to do antiestrogen therapy because her ER was 0% on final path.   10/06/2020  - 11/04/2020 Radiation Therapy   Adjuvant radiation    Relapse/Recurrence   Right mastectomy area skin changes: Initially treated for shingles but since lesions did not improve skin biopsy was obtained on 07/14/2021: Infiltrating carcinoma consistent with breast origin CK7 and GA TA 3 positive, ER 0%, PR 0%, HER2 2+ by IHC, FISH negative ratio 1.56, copy #2.1   07/26/2021 -  Chemotherapy   Patient is on Treatment Plan : BREAST METASTATIC Fam-Trastuzumab Deruxtecan-nxki (Enhertu) (5.4) q21d     01/14/2022 Imaging   CT Chest: Slight decrease in size of the right lower lobe pulmonary nodule. Stable tiny non solid nodule in the superior segment of left lower lobe. No new pulmonary nodules. Stable surgical changes from right mastectomy and axillary lymph node dissection. No new developing lymph node enlargement or other mass lesion.  Chest port. Fatty liver infiltration. Aortic Atherosclerosis (ICD10-I70.0).     CHIEF COMPLIANT: Enhertu  HISTORY OF PRESENT ILLNESS:   History of Present Illness   The patient, with Met breast cancer, has been undergoing Enhertu. She reports that her last treatment resulted in severe nausea and vomiting, which was worse than previous rounds. The patient also mentions a lack of appetite and sleep, particularly after her recent mountain trip. Despite these symptoms, the patient's recent scan shows no visible active disease. The patient's condition and response to treatment have been stable, with the exception of the recent increase in nausea and vomiting.         ALLERGIES:  has No Known Allergies.  MEDICATIONS:  Current Outpatient Medications  Medication Sig Dispense Refill   prochlorperazine (COMPAZINE) 10 MG tablet Take 1 tablet (10  mg total) by mouth every 6 (six) hours as needed for nausea or vomiting. 30 tablet 3   ACCU-CHEK GUIDE test strip See admin instructions.     ALPRAZolam (XANAX) 0.25 MG tablet Take 1 tablet (0.25 mg total) by mouth 2 (two) times daily  as needed for anxiety. 60 tablet 3   blood glucose meter kit and supplies KIT Dispense based on patient and insurance preference. Use up to four times daily as directed. 1 each 0   esomeprazole (NEXIUM) 20 MG capsule Take 20 mg by mouth daily.     gabapentin (NEURONTIN) 600 MG tablet TAKE 1 TABLET BY MOUTH THREE TIMES A DAY 270 tablet 3   lidocaine-prilocaine (EMLA) cream Apply topically once for 1 dose. 30 g 3   Multiple Vitamin (MULTIVITAMIN) capsule Take 1 capsule by mouth daily.     nicotine (NICODERM CQ - DOSED IN MG/24 HOURS) 14 mg/24hr patch Place 1 patch (14 mg total) onto the skin daily. 28 patch 0   ondansetron (ZOFRAN) 24 MG tablet Take 24 mg by mouth once.     OVER THE COUNTER MEDICATION Stool softener     Semaglutide,0.25 or 0.5MG /DOS, 2 MG/3ML SOPN Inject 0.5 mg into the skin once a week. 6 mL 3   No current facility-administered medications for this visit.    PHYSICAL EXAMINATION: ECOG PERFORMANCE STATUS: 1 - Symptomatic but completely ambulatory  Vitals:   11/15/22 1339  BP: (!) 141/68  Pulse: (!) 101  Resp: 18  Temp: (!) 97.5 F (36.4 C)  SpO2: 100%   Filed Weights   11/15/22 1339  Weight: 187 lb 14.4 oz (85.2 kg)      LABORATORY DATA:  I have reviewed the data as listed    Latest Ref Rng & Units 11/15/2022    1:20 PM 10/19/2022    9:21 AM 09/21/2022    9:25 AM  CMP  Glucose 70 - 99 mg/dL 829  562  130   BUN 8 - 23 mg/dL 8  8  8    Creatinine 0.44 - 1.00 mg/dL 8.65  7.84  6.96   Sodium 135 - 145 mmol/L 137  137  139   Potassium 3.5 - 5.1 mmol/L 3.8  3.7  3.8   Chloride 98 - 111 mmol/L 105  103  106   CO2 22 - 32 mmol/L 25  24  27    Calcium 8.9 - 10.3 mg/dL 9.3  9.2  9.7   Total Protein 6.5 - 8.1 g/dL 6.8  7.1  7.1   Total Bilirubin <1.2 mg/dL 0.5  0.7  0.4   Alkaline Phos 38 - 126 U/L 134  125  132   AST 15 - 41 U/L 31  35  37   ALT 0 - 44 U/L 29  32  32     Lab Results  Component Value Date   WBC 6.6 11/15/2022   HGB 13.7 11/15/2022   HCT  39.7 11/15/2022   MCV 94.1 11/15/2022   PLT 147 (L) 11/15/2022   NEUTROABS 4.9 11/15/2022    ASSESSMENT & PLAN:  Malignant neoplasm of upper-outer quadrant of right breast in female, estrogen receptor positive (HCC) 08/04/20: Right mastectomy (Dr. 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 Neoadjuvant chemotherapy with TCH Perjeta 6 cycles followed by Kadcyla maintenance discontinued 11/11/2020 because of peripheral neuropathy 2. Followed by  mastectomy with sentinel lymph node study 08/04/20 3. Followed by adjuvant radiation therapy 09/18/2020- 11/04/2020 4.  Decided not to do antiestrogen therapy because her ER was 0% on final path. 5.  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 Bone scan 08/03/2021: No definite evidence of metastatic disease  Echocardiogram 07/22/2021: EF 60 to 65%    Current treatment: Cycle 22 Enhertu Enhertu toxicities:  1. Fatigue that lasted for 3-5 days 2. nausea: Much improved 3.  Insomnia 4.  Worsening neuropathy: stable 5.  Hair thinning 6.  Diabetes: Much better control since he started Ozempic.   CT chest on July 21, 2022 demonstrating no evidence of metastatic disease progression .   Her husband was hospitalized with hypertensive urgency and was found to have an aortic aneurysm.  He is doing much better.    Diabetes: Currently on Ozempic.  CT CAP 11/11/2022: Small groundglass nodules in the lungs unchanged hepatic steatosis.  No evidence of metastatic disease. I recommended switching Enhertu to every 5 weeks.  Return to clinic every 5 weeks for Enhertu.     Diabetes Patient's A1C has improved with Ozempic. -Continue current management.      No orders of the defined types were placed in this encounter.  The patient has a good understanding of the overall plan. she agrees with it. she will call with any problems that may develop before the next visit here. Total time spent: 30 mins including face to face time and time spent for planning, charting and co-ordination of care   Tamsen Meek, MD 11/15/22

## 2022-11-15 NOTE — Patient Instructions (Signed)
 [facility] - A DEPT OF [facility]  Discharge Instructions: Thank you for choosing [facility] to provide your oncology and hematology care.   If you have a lab appointment with the Cancer Center, please go directly to the Cancer Center and check in at the registration area.   Wear comfortable clothing and clothing appropriate for easy access to any Portacath or PICC line.   We strive to give you quality time with your provider. You may need to reschedule your appointment if you arrive late (15 or more minutes).  Arriving late affects you and other patients whose appointments are after yours.  Also, if you miss three or more appointments without notifying the office, you may be dismissed from the clinic at the provider's discretion.      For prescription refill requests, have your pharmacy contact our office and allow 72 hours for refills to be completed.    Today you received the following chemotherapy and/or immunotherapy agents Enhertu      To help prevent nausea and vomiting after your treatment, we encourage you to take your nausea medication as directed.  BELOW ARE SYMPTOMS THAT SHOULD BE REPORTED IMMEDIATELY: *FEVER GREATER THAN 100.4 F (38 C) OR HIGHER *CHILLS OR SWEATING *NAUSEA AND VOMITING THAT IS NOT CONTROLLED WITH YOUR NAUSEA MEDICATION *UNUSUAL SHORTNESS OF BREATH *UNUSUAL BRUISING OR BLEEDING *URINARY PROBLEMS (pain or burning when urinating, or frequent urination) *BOWEL PROBLEMS (unusual diarrhea, constipation, pain near the anus) TENDERNESS IN MOUTH AND THROAT WITH OR WITHOUT PRESENCE OF ULCERS (sore throat, sores in mouth, or a toothache) UNUSUAL RASH, SWELLING OR PAIN  UNUSUAL VAGINAL DISCHARGE OR ITCHING   Items with * indicate a potential emergency and should be followed up as soon as possible or go to the Emergency Department if any problems should occur.  Please show the CHEMOTHERAPY ALERT CARD or IMMUNOTHERAPY  ALERT CARD at check-in to the Emergency Department and triage nurse.  Should you have questions after your visit or need to cancel or reschedule your appointment, please contact [facility] - A DEPT OF [facility]  Dept: [phone]  and follow the prompts.  Office hours are 8:00 a.m. to 4:30 p.m. Monday - Friday. Please note that voicemails left after 4:00 p.m. may not be returned until the following business day.  We are closed weekends and major holidays. You have access to a nurse at all times for urgent questions. Please call the main number to the clinic Dept: [phone] and follow the prompts.   For any non-urgent questions, you may also contact your provider using MyChart. We now offer e-Visits for anyone 17 and older to request care online for non-urgent symptoms. For details visit [email or web address].   Also download the MyChart app! Go to the app store, search "MyChart", open the app, select [place], and log in with your MyChart username and password.

## 2022-11-15 NOTE — Assessment & Plan Note (Addendum)
08/04/20: Right mastectomy (Dr. 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 Neoadjuvant chemotherapy with TCH Perjeta 6 cycles followed by Kadcyla maintenance discontinued 11/11/2020 because of peripheral neuropathy 2. Followed by mastectomy with sentinel lymph node study 08/04/20 3. Followed by adjuvant radiation therapy 09/18/2020- 11/04/2020 4.  Decided not to do antiestrogen therapy because her ER was 0% on final path. 5.  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 Bone scan 08/03/2021: No definite evidence of metastatic disease  Echocardiogram 07/22/2021: EF 60 to 65%    Current treatment: Cycle 22 Enhertu Enhertu toxicities:  1. Fatigue that lasted for 3-5 days 2. nausea: Much improved 3.  Insomnia 4.  Worsening neuropathy: stable 5.  Hair thinning 6.  Diabetes: Much better control since he started Ozempic.   CT chest on July 21, 2022 demonstrating no evidence of metastatic disease progression .   Her husband was hospitalized with hypertensive urgency and was found to have an aortic aneurysm.  He is doing much better.   Diabetes: Currently on Ozempic.  CT CAP 11/11/2022: Small groundglass nodules in the lungs unchanged hepatic steatosis.  No evidence of metastatic disease. I recommended switching Enhertu to every 5 weeks.  Return to  clinic every 5 weeks for Enhertu.

## 2022-11-17 ENCOUNTER — Other Ambulatory Visit: Payer: Self-pay | Admitting: *Deleted

## 2022-11-17 MED ORDER — ONDANSETRON HCL 24 MG PO TABS: 24.0000 mg | ORAL_TABLET | Freq: Once | ORAL | 0 refills | Status: DC

## 2022-11-17 MED ORDER — ONDANSETRON HCL 8 MG PO TABS: 8.0000 mg | ORAL_TABLET | Freq: Three times a day (TID) | ORAL | 3 refills | Status: AC | PRN

## 2022-12-13 ENCOUNTER — Other Ambulatory Visit: Payer: Medicare Other

## 2022-12-13 ENCOUNTER — Ambulatory Visit: Payer: Medicare Other

## 2022-12-13 ENCOUNTER — Ambulatory Visit: Payer: Medicare Other | Admitting: Adult Health

## 2022-12-19 ENCOUNTER — Other Ambulatory Visit (HOSPITAL_BASED_OUTPATIENT_CLINIC_OR_DEPARTMENT_OTHER): Payer: Self-pay | Admitting: *Deleted

## 2022-12-19 ENCOUNTER — Encounter (HOSPITAL_BASED_OUTPATIENT_CLINIC_OR_DEPARTMENT_OTHER): Payer: Self-pay | Admitting: Family Medicine

## 2022-12-19 ENCOUNTER — Encounter: Payer: Self-pay | Admitting: Hematology and Oncology

## 2022-12-19 MED FILL — Fosaprepitant Dimeglumine For IV Infusion 150 MG (Base Eq): INTRAVENOUS | Qty: 5 | Status: AC

## 2022-12-19 NOTE — Progress Notes (Signed)
error 

## 2022-12-20 ENCOUNTER — Inpatient Hospital Stay (HOSPITAL_BASED_OUTPATIENT_CLINIC_OR_DEPARTMENT_OTHER): Payer: Medicare Other | Admitting: Adult Health

## 2022-12-20 ENCOUNTER — Inpatient Hospital Stay: Payer: Medicare Other | Attending: Hematology and Oncology

## 2022-12-20 ENCOUNTER — Inpatient Hospital Stay: Payer: Medicare Other

## 2022-12-20 VITALS — BP 148/70 | HR 91 | Temp 98.0°F | Resp 16 | Wt 186.4 lb

## 2022-12-20 DIAGNOSIS — Z170 Estrogen receptor positive status [ER+]: Secondary | ICD-10-CM | POA: Diagnosis not present

## 2022-12-20 DIAGNOSIS — C773 Secondary and unspecified malignant neoplasm of axilla and upper limb lymph nodes: Secondary | ICD-10-CM | POA: Insufficient documentation

## 2022-12-20 DIAGNOSIS — I10 Essential (primary) hypertension: Secondary | ICD-10-CM | POA: Insufficient documentation

## 2022-12-20 DIAGNOSIS — T451X5D Adverse effect of antineoplastic and immunosuppressive drugs, subsequent encounter: Secondary | ICD-10-CM

## 2022-12-20 DIAGNOSIS — Z5112 Encounter for antineoplastic immunotherapy: Secondary | ICD-10-CM | POA: Insufficient documentation

## 2022-12-20 DIAGNOSIS — R911 Solitary pulmonary nodule: Secondary | ICD-10-CM | POA: Insufficient documentation

## 2022-12-20 DIAGNOSIS — E119 Type 2 diabetes mellitus without complications: Secondary | ICD-10-CM | POA: Insufficient documentation

## 2022-12-20 DIAGNOSIS — R5383 Other fatigue: Secondary | ICD-10-CM

## 2022-12-20 DIAGNOSIS — G620 Drug-induced polyneuropathy: Secondary | ICD-10-CM | POA: Diagnosis not present

## 2022-12-20 DIAGNOSIS — C50411 Malignant neoplasm of upper-outer quadrant of right female breast: Secondary | ICD-10-CM

## 2022-12-20 DIAGNOSIS — I700 Atherosclerosis of aorta: Secondary | ICD-10-CM | POA: Insufficient documentation

## 2022-12-20 LAB — CMP (CANCER CENTER ONLY)
ALT: 29 U/L (ref 0–44)
AST: 33 U/L (ref 15–41)
Albumin: 3.9 g/dL (ref 3.5–5.0)
Alkaline Phosphatase: 126 U/L (ref 38–126)
Anion gap: 7 (ref 5–15)
BUN: 10 mg/dL (ref 8–23)
CO2: 25 mmol/L (ref 22–32)
Calcium: 9.4 mg/dL (ref 8.9–10.3)
Chloride: 104 mmol/L (ref 98–111)
Creatinine: 0.65 mg/dL (ref 0.44–1.00)
GFR, Estimated: >60 mL/min (ref >60)
Glucose, Bld: 204 mg/dL — ABNORMAL HIGH (ref 70–99)
Potassium: 3.6 mmol/L (ref 3.5–5.1)
Sodium: 136 mmol/L (ref 135–145)
Total Bilirubin: 0.5 mg/dL (ref <1.2)
Total Protein: 6.6 g/dL (ref 6.5–8.1)

## 2022-12-20 LAB — CBC WITH DIFFERENTIAL (CANCER CENTER ONLY)
Abs Immature Granulocytes: 0.01 10*3/uL (ref 0.00–0.07)
Basophils Absolute: 0.1 10*3/uL (ref 0.0–0.1)
Basophils Relative: 1 %
Eosinophils Absolute: 0.2 10*3/uL (ref 0.0–0.5)
Eosinophils Relative: 3 %
HCT: 40.2 % (ref 36.0–46.0)
Hemoglobin: 13.6 g/dL (ref 12.0–15.0)
Immature Granulocytes: 0 %
Lymphocytes Relative: 15 %
Lymphs Abs: 1.1 10*3/uL (ref 0.7–4.0)
MCH: 31.9 pg (ref 26.0–34.0)
MCHC: 33.8 g/dL (ref 30.0–36.0)
MCV: 94.4 fL (ref 80.0–100.0)
Monocytes Absolute: 0.5 10*3/uL (ref 0.1–1.0)
Monocytes Relative: 7 %
Neutro Abs: 5.2 10*3/uL (ref 1.7–7.7)
Neutrophils Relative %: 74 %
Platelet Count: 145 10*3/uL — ABNORMAL LOW (ref 150–400)
RBC: 4.26 MIL/uL (ref 3.87–5.11)
RDW: 13.7 % (ref 11.5–15.5)
WBC Count: 7.1 10*3/uL (ref 4.0–10.5)
nRBC: 0 % (ref 0.0–0.2)

## 2022-12-20 MED ORDER — DEXAMETHASONE SODIUM PHOSPHATE 10 MG/ML IJ SOLN
10.0000 mg | Freq: Once | INTRAMUSCULAR | Status: AC
  Administered 2022-12-20: 10 mg via INTRAVENOUS
  Filled 2022-12-20: qty 1

## 2022-12-20 MED ORDER — ACETAMINOPHEN 325 MG PO TABS
650.0000 mg | ORAL_TABLET | Freq: Once | ORAL | Status: AC
  Administered 2022-12-20: 650 mg via ORAL
  Filled 2022-12-20: qty 2

## 2022-12-20 MED ORDER — PALONOSETRON HCL INJECTION 0.25 MG/5ML
0.2500 mg | Freq: Once | INTRAVENOUS | Status: AC
  Administered 2022-12-20: 0.25 mg via INTRAVENOUS
  Filled 2022-12-20: qty 5

## 2022-12-20 MED ORDER — FAM-TRASTUZUMAB DERUXTECAN-NXKI CHEMO 100 MG IV SOLR
3.2000 mg/kg | Freq: Once | INTRAVENOUS | Status: AC
  Administered 2022-12-20: 300 mg via INTRAVENOUS
  Filled 2022-12-20: qty 15

## 2022-12-20 MED ORDER — DEXTROSE 5 % IV SOLN: Freq: Once | INTRAVENOUS | Status: AC

## 2022-12-20 MED ORDER — SODIUM CHLORIDE 0.9% FLUSH
10.0000 mL | INTRAVENOUS | Status: DC | PRN
  Administered 2022-12-20: 10 mL

## 2022-12-20 MED ORDER — DIPHENHYDRAMINE HCL 25 MG PO CAPS
25.0000 mg | ORAL_CAPSULE | Freq: Once | ORAL | Status: AC
Start: 2022-12-20 — End: 2022-12-20
  Administered 2022-12-20: 25 mg via ORAL
  Filled 2022-12-20: qty 1

## 2022-12-20 MED ORDER — HEPARIN SOD (PORK) LOCK FLUSH 100 UNIT/ML IV SOLN
500.0000 [IU] | Freq: Once | INTRAVENOUS | Status: AC | PRN
Start: 2022-12-20 — End: 2022-12-20
  Administered 2022-12-20: 500 [IU]

## 2022-12-20 MED ORDER — SODIUM CHLORIDE 0.9% FLUSH
10.0000 mL | Freq: Once | INTRAVENOUS | Status: AC
  Administered 2022-12-20: 10 mL

## 2022-12-20 MED ORDER — FOSAPREPITANT DIMEGLUMINE INJECTION 150 MG
150.0000 mg | Freq: Once | INTRAVENOUS | Status: AC
  Administered 2022-12-20: 150 mg via INTRAVENOUS
  Filled 2022-12-20: qty 150

## 2022-12-20 NOTE — Progress Notes (Signed)
[facility] Cancer Follow up:    Hilbert Bible, FNP [address] [place] [address]   DIAGNOSIS:  Cancer Staging  Malignant neoplasm of upper-outer quadrant of right breast in female, estrogen receptor positive (HCC) Staging form: Breast, AJCC 8th Edition - Clinical stage from 03/10/2020: Stage IIIA (cT3, cN1, cM0, G3, ER+, PR-, HER2+) - Signed by [name], MD on 03/10/2020 Stage prefix: Initial diagnosis - Pathologic stage from 08/04/2020: No Stage Recommended (ypTis (DCIS), pN2, cM0) - Signed by Loa Socks, NP on 02/05/2021 Stage prefix: Post-therapy   SUMMARY OF ONCOLOGIC HISTORY: Oncology History  Malignant neoplasm of upper-outer quadrant of right breast in female, estrogen receptor positive (HCC)  03/10/2020 Initial Diagnosis   Palpable right breast mass x6 months.  Mammogram revealed left breast cysts (intraductal papilloma), 11 cm right breast mass and 2 masses 2.6 cm each in the right axilla: Biopsy grade 3 IDC ER 5% weak, PR 0%, Ki-67 25%, HER-2 3+ IHC positive   03/10/2020 Cancer Staging   Staging form: Breast, AJCC 8th Edition - Clinical stage from 03/10/2020: Stage IIIA (cT3, cN1, cM0, G3, ER+, PR-, HER2+) - Signed by [name], MD on 03/10/2020 Stage prefix: Initial diagnosis   03/19/2020 - 07/01/2020 Chemotherapy   TCH Perjeta   08/04/2020 Surgery   Right mastectomy (Dr. 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, high-grade DCIS with necrosis and calcifications, and 2 right axillary lymph nodes positive for metastatic carcinoma   08/04/2020 Cancer Staging   Staging form: Breast, AJCC 8th Edition - Pathologic stage from 08/04/2020: No Stage Recommended (ypTis (DCIS), pN2, cM0) - Signed by Loa Socks, NP on 02/05/2021 Stage prefix: Post-therapy   08/21/2020 - 10/23/2020 Chemotherapy   Discontinued due to peripheral neuropathy Patient is on Treatment Plan :  BREAST ADO-Trastuzumab Emtansine (Kadcyla) q21d      08/2020 -  Anti-estrogen oral therapy    Decided not to do antiestrogen therapy because her ER was 0% on final path.   10/06/2020 - 11/04/2020 Radiation Therapy   Adjuvant radiation    Relapse/Recurrence   Right mastectomy area skin changes: Initially treated for shingles but since lesions did not improve skin biopsy was obtained on 07/14/2021: Infiltrating carcinoma consistent with breast origin CK7 and GA TA 3 positive, ER 0%, PR 0%, HER2 2+ by IHC, FISH negative ratio 1.56, copy #2.1   07/26/2021 -  Chemotherapy   Patient is on Treatment Plan : BREAST METASTATIC Fam-Trastuzumab Deruxtecan-nxki (Enhertu) (5.4) q21d     01/14/2022 Imaging   CT Chest: Slight decrease in size of the right lower lobe pulmonary nodule. Stable tiny non solid nodule in the superior segment of left lower lobe. No new pulmonary nodules. Stable surgical changes from right mastectomy and axillary lymph node dissection. No new developing lymph node enlargement or other mass lesion.  Chest port. Fatty liver infiltration. Aortic Atherosclerosis (ICD10-I70.0).     CURRENT THERAPY: Enhertu  INTERVAL HISTORY: Sabrina Woods 67 y.o. female returns for f/u prior to receiving Enhertu.  She notes that the days following her treatment are typically not enjoyable, but by the end of the week, she generally feels better. She denies any new cough, shortness of breath, chest pain, or irregular heartbeats. She also denies any difficulty lying flat when sleeping. However, she mentions occasional discomfort in her right hip when sleeping on her side, which has led her to primarily sleep on her back. The patient's recent scans were reported  as good, but she acknowledges the need for another echo. She expresses satisfaction with her care team and looks forward to the upcoming holiday season.   Patient Active Problem List   Diagnosis Date Noted   Encounter to establish care with new  doctor 07/14/2022   Screening for colon cancer 07/14/2022   Encounter for smoking cessation counseling 07/14/2022   Essential (primary) hypertension 01/19/2022   Type 2 diabetes mellitus without complication, without long-term current use of insulin (HCC) 01/19/2022   Fatigue 04/08/2021   S/P mastectomy, right 08/04/2020   Chemotherapy-induced peripheral neuropathy (HCC) 05/20/2020   Malignant neoplasm of upper-outer quadrant of right breast in female, estrogen receptor positive (HCC) 03/10/2020   Fibroid    Smoker    Depression     has no known allergies.  MEDICAL HISTORY: Past Medical History:  Diagnosis Date   Acid reflux    Anxiety    Breast cancer (HCC)    Breast cancer (HCC) 2022   Depression    HISTORY OF   Fibroid    PONV (postoperative nausea and vomiting)    Smoker     SURGICAL HISTORY: Past Surgical History:  Procedure Laterality Date   CESAREAN SECTION     X 2   MASTECTOMY     MASTECTOMY WITH AXILLARY LYMPH NODE DISSECTION Right 08/04/2020   Procedure: RIGHT MASTECTOMY WITH RIGHT AXILLARY LYMPH NODE DISSECTION;  Surgeon: [name], MD;  Location: [facility] OR;  Service: General;  Laterality: Right;   PORTACATH PLACEMENT Left 03/17/2020   Procedure: INSERTION PORT-A-CATH;  Surgeon: [name], MD;  Location: [facility];  Service: General;  Laterality: Left;   PORTACATH PLACEMENT Left 08/05/2021   Procedure: PORT PLACEMENT WITH ULTRASOUND GUIDANCE;  Surgeon: [name], MD;  Location: [facility];  Service: General;  Laterality: Left;  LMA    SOCIAL HISTORY: Social History   Socioeconomic History   Marital status: Married    Spouse name: Not on file   Number of children: Not on file   Years of education: Not on file   Highest education level: Some college, no degree  Occupational History   Not on file  Tobacco Use   Smoking status: Every Day    Current packs/day: 1.00    Average packs/day: 1 pack/day for  50.0 years (50.0 ttl pk-yrs)    Types: Cigarettes    Start date: 1975   Smokeless tobacco: Never   Tobacco comments:    Using nicotine patch; currently smoking 2-3 cigs per day as of 07/14/22 ep  Vaping Use   Vaping status: Never Used  Substance and Sexual Activity   Alcohol use: Not Currently   Drug use: Never   Sexual activity: Yes    Birth control/protection: Post-menopausal  Other Topics Concern   Not on file  Social History Narrative   Right handed    Caffeine- 5-6 cups per day    Social Drivers of Health   Financial Resource Strain: Low Risk  (08/01/2022)   Overall Financial Resource Strain (CARDIA)    Difficulty of Paying Living Expenses: Not hard at all  Food Insecurity: No Food Insecurity (08/01/2022)   Hunger Vital Sign    Worried About Running Out of Food in the Last Year: Never true    Ran Out of Food in the Last Year: Never true  Transportation Needs: No Transportation Needs (08/01/2022)   PRAPARE - Administrator, Civil Service (Medical): No    Lack of Transportation (Non-Medical):  No  Physical Activity: Insufficiently Active (08/01/2022)   Exercise Vital Sign    Days of Exercise per Week: 3 days    Minutes of Exercise per Session: 20 min  Stress: No Stress Concern Present (08/01/2022)   [employer] of Occupational Health - Occupational Stress Questionnaire    Feeling of Stress : Only a little  Social Connections: Moderately Isolated (08/01/2022)   Social Connection and Isolation Panel [NHANES]    Frequency of Communication with Friends and Family: More than three times a week    Frequency of Social Gatherings with Friends and Family: Twice a week    Attends Religious Services: Never    Database administrator or Organizations: No    Attends Engineer, structural: Not on file    Marital Status: Married  Catering manager Violence: Not on file    FAMILY HISTORY: Family History  Problem Relation Age of Onset   Cancer Mother    Breast  cancer Mother    Diabetes Mother    Hypertension Mother    Hypertension Father    Hypertension Sister    Hypertension Brother     Review of Systems  Constitutional:  Negative for appetite change, chills, fatigue, fever and unexpected weight change.  HENT:   Negative for hearing loss, lump/mass and trouble swallowing.   Eyes:  Negative for eye problems and icterus.  Respiratory:  Negative for chest tightness, cough and shortness of breath.   Cardiovascular:  Negative for chest pain, leg swelling and palpitations.  Gastrointestinal:  Negative for abdominal distention, abdominal pain, constipation, diarrhea, nausea and vomiting.  Endocrine: Negative for hot flashes.  Genitourinary:  Negative for difficulty urinating.   Musculoskeletal:  Negative for arthralgias.  Skin:  Negative for itching and rash.  Neurological:  Negative for dizziness, extremity weakness, headaches and numbness.  Hematological:  Negative for adenopathy. Does not bruise/bleed easily.  Psychiatric/Behavioral:  Negative for depression. The patient is not nervous/anxious.       PHYSICAL EXAMINATION   Onc Performance Status - 12/20/22 1100       KPS SCALE   KPS % SCORE Able to carry on normal activity, minor s/s of disease             Vitals:   12/20/22 1127  BP: (!) 148/70  Pulse: 91  Resp: 16  Temp: 98 F (36.7 C)  SpO2: 100%    Physical Exam Constitutional:      General: She is not in acute distress.    Appearance: Normal appearance. She is not toxic-appearing.  HENT:     Head: Normocephalic and atraumatic.     Mouth/Throat:     Mouth: Mucous membranes are moist.     Pharynx: Oropharynx is clear. No oropharyngeal exudate or posterior oropharyngeal erythema.  Eyes:     General: No scleral icterus. Cardiovascular:     Rate and Rhythm: Normal rate and regular rhythm.     Pulses: Normal pulses.     Heart sounds: Normal heart sounds.  Pulmonary:     Effort: Pulmonary effort is normal.      Breath sounds: Normal breath sounds.  Abdominal:     General: Abdomen is flat. Bowel sounds are normal. There is no distension.     Palpations: Abdomen is soft.     Tenderness: There is no abdominal tenderness.  Musculoskeletal:        General: No swelling.     Cervical back: Neck supple.  Lymphadenopathy:     Cervical:  No cervical adenopathy.  Skin:    General: Skin is warm and dry.     Findings: No rash.  Neurological:     General: No focal deficit present.     Mental Status: She is alert.  Psychiatric:        Mood and Affect: Mood normal.        Behavior: Behavior normal.     LABORATORY DATA:  CBC    Component Value Date/Time   WBC 7.1 12/20/2022 1106   WBC 6.8 08/18/2021 0934   RBC 4.26 12/20/2022 1106   HGB 13.6 12/20/2022 1106   HCT 40.2 12/20/2022 1106   PLT 145 (L) 12/20/2022 1106   MCV 94.4 12/20/2022 1106   MCH 31.9 12/20/2022 1106   MCHC 33.8 12/20/2022 1106   RDW 13.7 12/20/2022 1106   LYMPHSABS 1.1 12/20/2022 1106   MONOABS 0.5 12/20/2022 1106   EOSABS 0.2 12/20/2022 1106   BASOSABS 0.1 12/20/2022 1106    CMP     Component Value Date/Time   NA 136 12/20/2022 1106   K 3.6 12/20/2022 1106   CL 104 12/20/2022 1106   CO2 25 12/20/2022 1106   GLUCOSE 204 (H) 12/20/2022 1106   BUN 10 12/20/2022 1106   CREATININE 0.65 12/20/2022 1106   CALCIUM 9.4 12/20/2022 1106   PROT 6.6 12/20/2022 1106   ALBUMIN 3.9 12/20/2022 1106   AST 33 12/20/2022 1106   ALT 29 12/20/2022 1106   ALKPHOS 126 12/20/2022 1106   BILITOT 0.5 12/20/2022 1106   GFRNONAA >60 12/20/2022 1106       PENDING LABS:   RADIOGRAPHIC STUDIES:  No results found.   PATHOLOGY:     ASSESSMENT and THERAPY PLAN:   Malignant neoplasm of upper-outer quadrant of right breast in female, estrogen receptor positive (HCC) 08/04/20: Right mastectomy (Dr. 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, high-grade DCIS with necrosis and  calcifications, and 2 right axillary lymph nodes positive for metastatic carcinoma   Treatment Plan: 1. Neoadjuvant chemotherapy with TCH Perjeta 6 cycles followed by Kadcyla maintenance discontinued 11/11/2020 because of peripheral neuropathy 2. Followed by mastectomy with sentinel lymph node study 08/04/20 3. Followed by adjuvant radiation therapy 09/18/2020- 11/04/2020 4.  Decided not to do antiestrogen therapy because her ER was 0% on final path. 5.  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 Bone scan 08/03/2021: No definite evidence of metastatic disease  CT chest on July 21, 2022 demonstrating no evidence of metastatic disease progression . CT CAP 11/11/2022: Small groundglass nodules in the lungs unchanged hepatic steatosis.  No evidence of metastatic disease. Echocardiogram 08/19/2022: EF 60 to 65%    Current treatment: Enhertu (adjusted to every 5 weeks based on recent scans from 11/11/2022) Enhertu toxicities:  1. Fatigue that lasted for 3-5 days 2. nausea: Much improved 3.  Insomnia 4.  Neuropathy: stable 5.  Hair thinning 6. Diabetes: Improved control with Ozembic   Echocardiogram ordered.  Will proceed with treatment based on most recent echo.    RTC in 5 weeks for labs, f/u, and Enhertu.     All questions were answered. The  patient knows to call the clinic with any problems, questions or concerns. We can certainly see the patient much sooner if necessary.  Total encounter time:30 minutes*in face-to-face  visit time, chart review, lab review, care coordination, order entry, and documentation of the encounter time.    Lillard Anes, NP 12/20/22 1:51 PM Medical Oncology and Hematology [facility] [address] [place], [place] [address] Tel. [phone]    Fax. [phone]  *Total Encounter Time as defined by the Centers for Medicare and Medicaid Services includes, in addition to the face-to-face time of a patient visit (documented in the note above) non-face-to-face time: obtaining and reviewing outside history, ordering and reviewing medications, tests or procedures, care coordination (communications with other health care professionals or caregivers) and documentation in the medical record.

## 2022-12-20 NOTE — Assessment & Plan Note (Signed)
08/04/20: Right mastectomy (Dr. 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 Neoadjuvant chemotherapy with TCH Perjeta 6 cycles followed by Kadcyla maintenance discontinued 11/11/2020 because of peripheral neuropathy 2. Followed by mastectomy with sentinel lymph node study 08/04/20 3. Followed by adjuvant radiation therapy 09/18/2020- 11/04/2020 4.  Decided not to do antiestrogen therapy because her ER was 0% on final path. 5.  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 Bone scan 08/03/2021: No definite evidence of metastatic disease  CT chest on July 21, 2022 demonstrating no evidence of metastatic disease progression . CT CAP 11/11/2022: Small groundglass nodules in the lungs unchanged hepatic steatosis.  No evidence of metastatic disease. Echocardiogram 08/19/2022: EF 60 to 65%    Current treatment: Enhertu (adjusted to every 5 weeks based on recent scans from 11/11/2022) Enhertu toxicities:  1. Fatigue that lasted for 3-5 days 2. nausea: Much improved 3.  Insomnia 4.  Neuropathy: stable 5.  Hair thinning 6. Diabetes: Improved control with Ozembic   Echocardiogram ordered.  Will proceed with treatment based on most recent echo.    RTC in 5 weeks for labs, f/u, and Enhertu.

## 2022-12-29 ENCOUNTER — Other Ambulatory Visit (HOSPITAL_BASED_OUTPATIENT_CLINIC_OR_DEPARTMENT_OTHER): Payer: Self-pay

## 2023-01-04 ENCOUNTER — Other Ambulatory Visit: Payer: Self-pay

## 2023-01-10 ENCOUNTER — Ambulatory Visit: Payer: Medicare Other

## 2023-01-10 ENCOUNTER — Ambulatory Visit: Payer: Medicare Other | Admitting: Hematology and Oncology

## 2023-01-10 ENCOUNTER — Other Ambulatory Visit: Payer: Medicare Other

## 2023-01-16 ENCOUNTER — Ambulatory Visit: Payer: Medicare Other | Attending: Adult Health

## 2023-01-16 VITALS — Wt 187.1 lb

## 2023-01-16 DIAGNOSIS — Z483 Aftercare following surgery for neoplasm: Secondary | ICD-10-CM | POA: Insufficient documentation

## 2023-01-16 NOTE — Therapy (Signed)
 OUTPATIENT PHYSICAL THERAPY SOZO SCREENING NOTE   Patient Name: Sabrina Woods MRN: [id number] DOB:Oct 28, 1955, 68 y.o., female Today's Date: 01/16/2023  PCP: Knute Thersia Bitters, FNP REFERRING PROVIDER: Crawford Jacobsen Cornett*   PT End of Session - 01/16/23 1626     Visit Number 13   # unchanged due to screen only   PT Start Time 1624    PT Stop Time 1628    PT Time Calculation (min) 4 min    Activity Tolerance Patient tolerated treatment well    Behavior During Therapy [facility] for tasks assessed/performed             Past Medical History:  Diagnosis Date   Acid reflux    Anxiety    Breast cancer (HCC)    Breast cancer (HCC) 2022   Depression    HISTORY OF   Fibroid    PONV (postoperative nausea and vomiting)    Smoker    Past Surgical History:  Procedure Laterality Date   CESAREAN SECTION     X 2   MASTECTOMY     MASTECTOMY WITH AXILLARY LYMPH NODE DISSECTION Right 08/04/2020   Procedure: RIGHT MASTECTOMY WITH RIGHT AXILLARY LYMPH NODE DISSECTION;  Surgeon: Ebbie Cough, MD;  Location: [facility] OR;  Service: General;  Laterality: Right;   PORTACATH PLACEMENT Left 03/17/2020   Procedure: INSERTION PORT-A-CATH;  Surgeon: Ebbie Cough, MD;  Location: [facility];  Service: General;  Laterality: Left;   PORTACATH PLACEMENT Left 08/05/2021   Procedure: PORT PLACEMENT WITH ULTRASOUND GUIDANCE;  Surgeon: Ebbie Cough, MD;  Location: [facility];  Service: General;  Laterality: Left;  LMA   Patient Active Problem List   Diagnosis Date Noted   Encounter to establish care with new doctor 07/14/2022   Screening for colon cancer 07/14/2022   Encounter for smoking cessation counseling 07/14/2022   Essential (primary) hypertension 01/19/2022   Type 2 diabetes mellitus without complication, without long-term current use of insulin (HCC) 01/19/2022   Fatigue 04/08/2021   S/P mastectomy, right 08/04/2020   Chemotherapy-induced  peripheral neuropathy (HCC) 05/20/2020   Malignant neoplasm of upper-outer quadrant of right breast in female, estrogen receptor positive (HCC) 03/10/2020   Fibroid    Smoker    Depression     REFERRING DIAG: right breast cancer at risk for lymphedema  THERAPY DIAG:  Aftercare following surgery for neoplasm  PERTINENT HISTORY: R breast cancer s/p R mastectomy on 08/04/20 with 2 R positive axillary nodes, high grade DCIS. Has completed chemo as of Oct 2022 and radiation 11/04/20  PRECAUTIONS: right UE Lymphedema risk, None  SUBJECTIVE: Pt returns for her 3 month L-Dex screen.   PAIN:  Are you having pain? No  SOZO SCREENING: Patient was assessed today using the SOZO machine to determine the lymphedema index score. This was compared to her baseline score. It was determined that she is within the recommended range when compared to her baseline and no further action is needed at this time. She will continue SOZO screenings. These are done every 3 months for 2 years post operatively followed by every 6 months for 2 years, and then annually.   L-DEX FLOWSHEETS - 01/16/23 1600       L-DEX LYMPHEDEMA SCREENING   Measurement Type Unilateral    L-DEX MEASUREMENT EXTREMITY Upper Extremity    POSITION  Standing    DOMINANT SIDE Right    At Risk Side Right    BASELINE SCORE (UNILATERAL) -3.5    L-DEX SCORE (UNILATERAL) -  0.3    VALUE CHANGE (UNILAT) 3.2               Aden Berwyn Caldron, PTA 01/16/2023, 4:32 PM

## 2023-01-17 ENCOUNTER — Other Ambulatory Visit: Payer: Self-pay

## 2023-01-20 ENCOUNTER — Ambulatory Visit (HOSPITAL_COMMUNITY)
Admission: RE | Admit: 2023-01-20 | Discharge: 2023-01-20 | Disposition: A | Discharge status: Home / Self Care | Payer: Medicare Other | Source: Ambulatory Visit | Attending: Adult Health | Admitting: Adult Health

## 2023-01-20 DIAGNOSIS — C50411 Malignant neoplasm of upper-outer quadrant of right female breast: Secondary | ICD-10-CM | POA: Diagnosis not present

## 2023-01-20 DIAGNOSIS — Z5181 Encounter for therapeutic drug level monitoring: Secondary | ICD-10-CM | POA: Insufficient documentation

## 2023-01-20 DIAGNOSIS — Z87891 Personal history of nicotine dependence: Secondary | ICD-10-CM | POA: Insufficient documentation

## 2023-01-20 DIAGNOSIS — Z0189 Encounter for other specified special examinations: Secondary | ICD-10-CM | POA: Diagnosis not present

## 2023-01-20 DIAGNOSIS — I10 Essential (primary) hypertension: Secondary | ICD-10-CM | POA: Diagnosis not present

## 2023-01-20 DIAGNOSIS — I519 Heart disease, unspecified: Secondary | ICD-10-CM | POA: Diagnosis not present

## 2023-01-20 DIAGNOSIS — I358 Other nonrheumatic aortic valve disorders: Secondary | ICD-10-CM | POA: Insufficient documentation

## 2023-01-20 DIAGNOSIS — Z170 Estrogen receptor positive status [ER+]: Secondary | ICD-10-CM | POA: Insufficient documentation

## 2023-01-20 DIAGNOSIS — Z7960 Long term (current) use of unspecified immunomodulators and immunosuppressants: Secondary | ICD-10-CM | POA: Diagnosis not present

## 2023-01-20 DIAGNOSIS — T451X5D Adverse effect of antineoplastic and immunosuppressive drugs, subsequent encounter: Secondary | ICD-10-CM | POA: Insufficient documentation

## 2023-01-20 DIAGNOSIS — E119 Type 2 diabetes mellitus without complications: Secondary | ICD-10-CM | POA: Insufficient documentation

## 2023-01-20 LAB — ECHOCARDIOGRAM COMPLETE: S' Lateral: 2.2 cm

## 2023-01-23 MED FILL — Fosaprepitant Dimeglumine For IV Infusion 150 MG (Base Eq): INTRAVENOUS | Qty: 5 | Status: AC

## 2023-01-24 ENCOUNTER — Other Ambulatory Visit: Payer: Self-pay

## 2023-01-24 ENCOUNTER — Inpatient Hospital Stay: Payer: Medicare Other | Attending: Hematology and Oncology | Admitting: Hematology and Oncology

## 2023-01-24 ENCOUNTER — Inpatient Hospital Stay: Payer: Medicare Other | Attending: Hematology and Oncology

## 2023-01-24 ENCOUNTER — Inpatient Hospital Stay: Payer: Medicare Other

## 2023-01-24 VITALS — BP 159/73 | HR 100 | Temp 97.8°F | Resp 18 | Ht 66.0 in | Wt 186.6 lb

## 2023-01-24 VITALS — BP 141/69 | HR 82 | Resp 16

## 2023-01-24 DIAGNOSIS — Z5112 Encounter for antineoplastic immunotherapy: Secondary | ICD-10-CM | POA: Diagnosis present

## 2023-01-24 DIAGNOSIS — R5383 Other fatigue: Secondary | ICD-10-CM

## 2023-01-24 DIAGNOSIS — Z170 Estrogen receptor positive status [ER+]: Secondary | ICD-10-CM | POA: Insufficient documentation

## 2023-01-24 DIAGNOSIS — C773 Secondary and unspecified malignant neoplasm of axilla and upper limb lymph nodes: Secondary | ICD-10-CM | POA: Insufficient documentation

## 2023-01-24 DIAGNOSIS — Z139 Encounter for screening, unspecified: Secondary | ICD-10-CM | POA: Diagnosis not present

## 2023-01-24 DIAGNOSIS — R030 Elevated blood-pressure reading, without diagnosis of hypertension: Secondary | ICD-10-CM | POA: Insufficient documentation

## 2023-01-24 DIAGNOSIS — G620 Drug-induced polyneuropathy: Secondary | ICD-10-CM | POA: Insufficient documentation

## 2023-01-24 DIAGNOSIS — E0865 Diabetes mellitus due to underlying condition with hyperglycemia: Secondary | ICD-10-CM

## 2023-01-24 DIAGNOSIS — R911 Solitary pulmonary nodule: Secondary | ICD-10-CM | POA: Diagnosis not present

## 2023-01-24 DIAGNOSIS — C50411 Malignant neoplasm of upper-outer quadrant of right female breast: Secondary | ICD-10-CM | POA: Diagnosis present

## 2023-01-24 LAB — CMP (CANCER CENTER ONLY)
ALT: 28 U/L (ref 0–44)
AST: 30 U/L (ref 15–41)
Albumin: 4 g/dL (ref 3.5–5.0)
Alkaline Phosphatase: 134 U/L — ABNORMAL HIGH (ref 38–126)
Anion gap: 7 (ref 5–15)
BUN: 7 mg/dL — ABNORMAL LOW (ref 8–23)
CO2: 26 mmol/L (ref 22–32)
Calcium: 9.7 mg/dL (ref 8.9–10.3)
Chloride: 105 mmol/L (ref 98–111)
Creatinine: 0.65 mg/dL (ref 0.44–1.00)
GFR, Estimated: >60 mL/min (ref >60)
Glucose, Bld: 147 mg/dL — ABNORMAL HIGH (ref 70–99)
Potassium: 3.7 mmol/L (ref 3.5–5.1)
Sodium: 138 mmol/L (ref 135–145)
Total Bilirubin: 0.5 mg/dL (ref 0.0–1.2)
Total Protein: 7.1 g/dL (ref 6.5–8.1)

## 2023-01-24 LAB — CBC WITH DIFFERENTIAL (CANCER CENTER ONLY)
Abs Immature Granulocytes: 0.02 10*3/uL (ref 0.00–0.07)
Basophils Absolute: 0.1 10*3/uL (ref 0.0–0.1)
Basophils Relative: 1 %
Eosinophils Absolute: 0.2 10*3/uL (ref 0.0–0.5)
Eosinophils Relative: 3 %
HCT: 41.1 % (ref 36.0–46.0)
Hemoglobin: 14 g/dL (ref 12.0–15.0)
Immature Granulocytes: 0 %
Lymphocytes Relative: 18 %
Lymphs Abs: 1.2 10*3/uL (ref 0.7–4.0)
MCH: 31.4 pg (ref 26.0–34.0)
MCHC: 34.1 g/dL (ref 30.0–36.0)
MCV: 92.2 fL (ref 80.0–100.0)
Monocytes Absolute: 0.5 10*3/uL (ref 0.1–1.0)
Monocytes Relative: 7 %
Neutro Abs: 4.7 10*3/uL (ref 1.7–7.7)
Neutrophils Relative %: 71 %
Platelet Count: 145 10*3/uL — ABNORMAL LOW (ref 150–400)
RBC: 4.46 MIL/uL (ref 3.87–5.11)
RDW: 13.4 % (ref 11.5–15.5)
WBC Count: 6.6 10*3/uL (ref 4.0–10.5)
nRBC: 0 % (ref 0.0–0.2)

## 2023-01-24 LAB — HEMOGLOBIN A1C
Hgb A1c MFr Bld: 6.6 % — ABNORMAL HIGH (ref 4.8–5.6)
Mean Plasma Glucose: 142.72 mg/dL

## 2023-01-24 MED ORDER — SODIUM CHLORIDE 0.9% FLUSH
10.0000 mL | Freq: Once | INTRAVENOUS | Status: AC
  Administered 2023-01-24: 10 mL

## 2023-01-24 MED ORDER — FAM-TRASTUZUMAB DERUXTECAN-NXKI CHEMO 100 MG IV SOLR
3.2000 mg/kg | Freq: Once | INTRAVENOUS | Status: AC
  Administered 2023-01-24: 300 mg via INTRAVENOUS
  Filled 2023-01-24: qty 15

## 2023-01-24 MED ORDER — SODIUM CHLORIDE 0.9 % IV SOLN
150.0000 mg | Freq: Once | INTRAVENOUS | Status: AC
  Administered 2023-01-24: 150 mg via INTRAVENOUS
  Filled 2023-01-24: qty 150

## 2023-01-24 MED ORDER — DEXTROSE 5 % IV SOLN
Freq: Once | INTRAVENOUS | Status: AC
Start: 2023-01-24 — End: 2023-01-24

## 2023-01-24 MED ORDER — PALONOSETRON HCL INJECTION 0.25 MG/5ML
0.2500 mg | Freq: Once | INTRAVENOUS | Status: AC
  Administered 2023-01-24: 0.25 mg via INTRAVENOUS
  Filled 2023-01-24: qty 5

## 2023-01-24 MED ORDER — SODIUM CHLORIDE 0.9% FLUSH
10.0000 mL | INTRAVENOUS | Status: DC | PRN
  Administered 2023-01-24: 10 mL

## 2023-01-24 MED ORDER — ACETAMINOPHEN 325 MG PO TABS
650.0000 mg | ORAL_TABLET | Freq: Once | ORAL | Status: AC
Start: 2023-01-24 — End: 2023-01-24
  Administered 2023-01-24: 650 mg via ORAL
  Filled 2023-01-24: qty 2

## 2023-01-24 MED ORDER — DIPHENHYDRAMINE HCL 25 MG PO CAPS
25.0000 mg | ORAL_CAPSULE | Freq: Once | ORAL | Status: AC
  Administered 2023-01-24: 25 mg via ORAL
  Filled 2023-01-24: qty 1

## 2023-01-24 MED ORDER — HEPARIN SOD (PORK) LOCK FLUSH 100 UNIT/ML IV SOLN
500.0000 [IU] | Freq: Once | INTRAVENOUS | Status: AC | PRN
  Administered 2023-01-24: 500 [IU]

## 2023-01-24 MED ORDER — DEXAMETHASONE SODIUM PHOSPHATE 10 MG/ML IJ SOLN
10.0000 mg | Freq: Once | INTRAMUSCULAR | Status: AC
  Administered 2023-01-24: 10 mg via INTRAVENOUS
  Filled 2023-01-24: qty 1

## 2023-01-24 NOTE — Progress Notes (Signed)
Patient Care Team: Hilbert Bible, FNP as PCP - General (Family Medicine) [name], MD as Consulting Physician (General Surgery) Mathilda Puffer, MD as Consulting Physician (Radiation Oncology) [name], MD as Consulting Physician (Hematology and Oncology)  DIAGNOSIS:  Encounter Diagnoses  Name Primary?   Malignant neoplasm of upper-outer quadrant of right breast in female, estrogen receptor positive (HCC) Yes   Encounter for health-related screening    Diabetes mellitus due to underlying condition with hyperglycemia (HCC)     SUMMARY OF ONCOLOGIC HISTORY: Oncology History  Malignant neoplasm of upper-outer quadrant of right breast in female, estrogen receptor positive (HCC)  03/10/2020 Initial Diagnosis   Palpable right breast mass x6 months.  Mammogram revealed left breast cysts (intraductal papilloma), 11 cm right breast mass and 2 masses 2.6 cm each in the right axilla: Biopsy grade 3 IDC ER 5% weak, PR 0%, Ki-67 25%, HER-2 3+ IHC positive   03/10/2020 Cancer Staging   Staging form: Breast, AJCC 8th Edition - Clinical stage from 03/10/2020: Stage IIIA (cT3, cN1, cM0, G3, ER+, PR-, HER2+) - Signed by [name], MD on 03/10/2020 Stage prefix: Initial diagnosis   03/19/2020 - 07/01/2020 Chemotherapy   TCH Perjeta   08/04/2020 Surgery   Right mastectomy (Dr. 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, high-grade DCIS with necrosis and calcifications, and 2 right axillary lymph nodes positive for metastatic carcinoma   08/04/2020 Cancer Staging   Staging form: Breast, AJCC 8th Edition - Pathologic stage from 08/04/2020: No Stage Recommended (ypTis (DCIS), pN2, cM0) - Signed by Loa Socks, NP on 02/05/2021 Stage prefix: Post-therapy   08/21/2020 - 10/23/2020 Chemotherapy   Discontinued due to peripheral neuropathy Patient is on Treatment Plan : BREAST ADO-Trastuzumab Emtansine (Kadcyla) q21d      08/2020 -   Anti-estrogen oral therapy    Decided not to do antiestrogen therapy because her ER was 0% on final path.   10/06/2020 - 11/04/2020 Radiation Therapy   Adjuvant radiation    Relapse/Recurrence   Right mastectomy area skin changes: Initially treated for shingles but since lesions did not improve skin biopsy was obtained on 07/14/2021: Infiltrating carcinoma consistent with breast origin CK7 and GA TA 3 positive, ER 0%, PR 0%, HER2 2+ by IHC, FISH negative ratio 1.56, copy #2.1   07/26/2021 -  Chemotherapy   Patient is on Treatment Plan : BREAST METASTATIC Fam-Trastuzumab Deruxtecan-nxki (Enhertu) (5.4) q21d     01/14/2022 Imaging   CT Chest: Slight decrease in size of the right lower lobe pulmonary nodule. Stable tiny non solid nodule in the superior segment of left lower lobe. No new pulmonary nodules. Stable surgical changes from right mastectomy and axillary lymph node dissection. No new developing lymph node enlargement or other mass lesion.  Chest port. Fatty liver infiltration. Aortic Atherosclerosis (ICD10-I70.0).     CHIEF COMPLIANT: Follow-up on Enhertu  HISTORY OF PRESENT ILLNESS:  History of Present Illness   The patient, with a history of an met breast cancer, presents for a routine follow-up visit. She reports variable energy levels but overall feels okay. She continues to experience numbness and tingling in the left hand, specifically the little finger and index finger, which she attributes to the cold weather. She has no new complaints and is managing her symptoms well.  The patient also mentions a recent echocardiogram, which showed excellent heart function. She reports a slightly elevated blood pressure reading at the current visit but is not overly concerned as her  readings are typically high. She also requests an A1c test, which she has discussed with her primary care provider.         ALLERGIES:  has no known allergies.  MEDICATIONS:  Current Outpatient Medications   Medication Sig Dispense Refill   ALPRAZolam (XANAX) 0.25 MG tablet Take 1 tablet (0.25 mg total) by mouth 2 (two) times daily as needed for anxiety. 60 tablet 3   esomeprazole (NEXIUM) 20 MG capsule Take 20 mg by mouth daily.     gabapentin (NEURONTIN) 600 MG tablet TAKE 1 TABLET BY MOUTH THREE TIMES A DAY 270 tablet 3   lidocaine-prilocaine (EMLA) cream Apply topically once.     Multiple Vitamin (MULTIVITAMIN) capsule Take 1 capsule by mouth daily.     nicotine (NICODERM CQ - DOSED IN MG/24 HOURS) 14 mg/24hr patch Place 1 patch (14 mg total) onto the skin daily. 28 patch 0   ondansetron (ZOFRAN) 8 MG tablet Take 1 tablet (8 mg total) by mouth every 8 (eight) hours as needed for nausea. Take the 3rd day after chemo 30 tablet 3   OVER THE COUNTER MEDICATION Stool softener     prochlorperazine (COMPAZINE) 10 MG tablet Take 1 tablet (10 mg total) by mouth every 6 (six) hours as needed for nausea or vomiting. 30 tablet 3   Semaglutide,0.25 or 0.5MG /DOS, 2 MG/3ML SOPN Inject 0.5 mg into the skin once a week. 6 mL 3   No current facility-administered medications for this visit.    PHYSICAL EXAMINATION: ECOG PERFORMANCE STATUS: 1 - Symptomatic but completely ambulatory  Vitals:   01/24/23 0933  BP: (!) 159/73  Pulse: 100  Resp: 18  Temp: 97.8 F (36.6 C)  SpO2: 100%   Filed Weights   01/24/23 0933  Weight: 186 lb 9.6 oz (84.6 kg)    Physical Exam   VITALS: BP- 157/? CARDIOVASCULAR: Heart function at a high level.      (exam performed in the presence of a chaperone)  LABORATORY DATA:  I have reviewed the data as listed    Latest Ref Rng & Units 12/20/2022   11:06 AM 11/15/2022    1:20 PM 10/19/2022    9:21 AM  CMP  Glucose 70 - 99 mg/dL 846  962  952   BUN 8 - 23 mg/dL 10  8  8    Creatinine 0.44 - 1.00 mg/dL 8.41  3.24  4.01   Sodium 135 - 145 mmol/L 136  137  137   Potassium 3.5 - 5.1 mmol/L 3.6  3.8  3.7   Chloride 98 - 111 mmol/L 104  105  103   CO2 22 - 32 mmol/L 25   25  24    Calcium 8.9 - 10.3 mg/dL 9.4  9.3  9.2   Total Protein 6.5 - 8.1 g/dL 6.6  6.8  7.1   Total Bilirubin <1.2 mg/dL 0.5  0.5  0.7   Alkaline Phos 38 - 126 U/L 126  134  125   AST 15 - 41 U/L 33  31  35   ALT 0 - 44 U/L 29  29  32     Lab Results  Component Value Date   WBC 6.6 01/24/2023   HGB 14.0 01/24/2023   HCT 41.1 01/24/2023   MCV 92.2 01/24/2023   PLT 145 (L) 01/24/2023   NEUTROABS 4.7 01/24/2023    ASSESSMENT & PLAN:  Malignant neoplasm of upper-outer quadrant of right breast in female, estrogen receptor positive (HCC) 08/04/20: Right mastectomy (Dr.  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 Neoadjuvant chemotherapy with TCH Perjeta 6 cycles followed by Kadcyla maintenance discontinued 11/11/2020 because of peripheral neuropathy 2. Followed by mastectomy with sentinel lymph node study 08/04/20 3. Followed by adjuvant radiation therapy 09/18/2020- 11/04/2020 4.  Decided not to do antiestrogen therapy because her ER was 0% on final path. 5.  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 Bone scan 08/03/2021: No definite evidence of metastatic disease  CT chest on July 21, 2022 demonstrating no evidence of metastatic disease progression . CT CAP 11/11/2022: Small groundglass nodules in the lungs unchanged hepatic steatosis.   No evidence of metastatic disease. Echocardiogram 08/19/2022: EF 60 to 65%    Current treatment: Enhertu (adjusted to every 5 weeks from 11/11/2022) Enhertu toxicities:  1. Fatigue that lasted for 3-5 days 2. nausea: Much improved 3.  Insomnia 4.  Neuropathy: stable 5.  Hair thinning 6. Diabetes: Improved control with Ozembic   Echocardiogram January 2024: EF 70%.     RTC in 5 weeks for labs, f/u, and Enhertu.  ------------------------------------- Assessment and Plan    Peripheral Neuropathy Persistent numbness and tingling in the left hand, specifically the little finger and index finger. No change in symptoms. -Continue current management.  Elevated Blood Pressure Blood pressure was noted to be high during the visit (157). However, the patient's blood pressure is typically high during clinic visits. -No changes to current management.  General Health Maintenance / Followup Plans -Order A1c test today. -Next imaging scans to be done in late April or May. -Next appointment in five weeks. -After next scan, consider extending appointment intervals to six weeks.          Orders Placed This Encounter  Procedures   Hemoglobin A1c    Standing Status:   Future    Expected Date:   01/24/2023    Expiration Date:   01/24/2024   The patient has a good understanding of the overall plan. she agrees with it. she will call with any problems that may develop before the next visit here. Total time spent: 30 mins including face to face time and time spent for planning, charting and co-ordination of care   Tamsen Meek, MD 01/24/23

## 2023-01-24 NOTE — Patient Instructions (Signed)
 [facility] - A DEPT OF [facility]  Discharge Instructions: Thank you for choosing [facility] to provide your oncology and hematology care.   If you have a lab appointment with the Cancer Center, please go directly to the Cancer Center and check in at the registration area.   Wear comfortable clothing and clothing appropriate for easy access to any Portacath or PICC line.   We strive to give you quality time with your provider. You may need to reschedule your appointment if you arrive late (15 or more minutes).  Arriving late affects you and other patients whose appointments are after yours.  Also, if you miss three or more appointments without notifying the office, you may be dismissed from the clinic at the provider's discretion.      For prescription refill requests, have your pharmacy contact our office and allow 72 hours for refills to be completed.    Today you received the following chemotherapy and/or immunotherapy agents: Enhertu      To help prevent nausea and vomiting after your treatment, we encourage you to take your nausea medication as directed.  BELOW ARE SYMPTOMS THAT SHOULD BE REPORTED IMMEDIATELY: *FEVER GREATER THAN 100.4 F (38 C) OR HIGHER *CHILLS OR SWEATING *NAUSEA AND VOMITING THAT IS NOT CONTROLLED WITH YOUR NAUSEA MEDICATION *UNUSUAL SHORTNESS OF BREATH *UNUSUAL BRUISING OR BLEEDING *URINARY PROBLEMS (pain or burning when urinating, or frequent urination) *BOWEL PROBLEMS (unusual diarrhea, constipation, pain near the anus) TENDERNESS IN MOUTH AND THROAT WITH OR WITHOUT PRESENCE OF ULCERS (sore throat, sores in mouth, or a toothache) UNUSUAL RASH, SWELLING OR PAIN  UNUSUAL VAGINAL DISCHARGE OR ITCHING   Items with * indicate a potential emergency and should be followed up as soon as possible or go to the Emergency Department if any problems should occur.  Please show the CHEMOTHERAPY ALERT CARD or IMMUNOTHERAPY  ALERT CARD at check-in to the Emergency Department and triage nurse.  Should you have questions after your visit or need to cancel or reschedule your appointment, please contact [facility] - A DEPT OF [facility]  Dept: [phone]  and follow the prompts.  Office hours are 8:00 a.m. to 4:30 p.m. Monday - Friday. Please note that voicemails left after 4:00 p.m. may not be returned until the following business day.  We are closed weekends and major holidays. You have access to a nurse at all times for urgent questions. Please call the main number to the clinic Dept: [phone] and follow the prompts.   For any non-urgent questions, you may also contact your provider using MyChart. We now offer e-Visits for anyone 70 and older to request care online for non-urgent symptoms. For details visit [email or web address].   Also download the MyChart app! Go to the app store, search "MyChart", open the app, select [facility], and log in with your MyChart username and password.

## 2023-01-24 NOTE — Assessment & Plan Note (Signed)
08/04/20: Right mastectomy (Dr. 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 Neoadjuvant chemotherapy with TCH Perjeta 6 cycles followed by Kadcyla maintenance discontinued 11/11/2020 because of peripheral neuropathy 2. Followed by mastectomy with sentinel lymph node study 08/04/20 3. Followed by adjuvant radiation therapy 09/18/2020- 11/04/2020 4.  Decided not to do antiestrogen therapy because her ER was 0% on final path. 5.  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 Bone scan 08/03/2021: No definite evidence of metastatic disease  CT chest on July 21, 2022 demonstrating no evidence of metastatic disease progression . CT CAP 11/11/2022: Small groundglass nodules in the lungs unchanged hepatic steatosis.  No evidence of metastatic disease. Echocardiogram 08/19/2022: EF 60 to 65%    Current treatment: Enhertu (adjusted to every 5 weeks from 11/11/2022) Enhertu toxicities:  1. Fatigue that lasted for 3-5 days 2. nausea: Much improved 3.  Insomnia 4.  Neuropathy: stable 5.  Hair thinning 6. Diabetes: Improved control with Ozembic   Echocardiogram ordered.  Will proceed with treatment based on most recent echo.     RTC in 5 weeks for labs, f/u, and Enhertu.

## 2023-01-26 ENCOUNTER — Other Ambulatory Visit: Payer: Self-pay

## 2023-01-27 ENCOUNTER — Other Ambulatory Visit: Payer: Self-pay

## 2023-01-28 ENCOUNTER — Other Ambulatory Visit: Payer: Self-pay

## 2023-02-02 ENCOUNTER — Encounter (HOSPITAL_BASED_OUTPATIENT_CLINIC_OR_DEPARTMENT_OTHER): Payer: Self-pay | Admitting: Family Medicine

## 2023-02-02 ENCOUNTER — Other Ambulatory Visit (HOSPITAL_BASED_OUTPATIENT_CLINIC_OR_DEPARTMENT_OTHER): Payer: Self-pay

## 2023-02-02 ENCOUNTER — Ambulatory Visit (INDEPENDENT_AMBULATORY_CARE_PROVIDER_SITE_OTHER): Payer: Medicare Other | Admitting: Family Medicine

## 2023-02-02 VITALS — BP 126/71 | HR 78 | Ht 66.0 in | Wt 184.0 lb

## 2023-02-02 DIAGNOSIS — G620 Drug-induced polyneuropathy: Secondary | ICD-10-CM | POA: Diagnosis not present

## 2023-02-02 DIAGNOSIS — E119 Type 2 diabetes mellitus without complications: Secondary | ICD-10-CM

## 2023-02-02 DIAGNOSIS — Z0000 Encounter for general adult medical examination without abnormal findings: Secondary | ICD-10-CM

## 2023-02-02 DIAGNOSIS — T451X5A Adverse effect of antineoplastic and immunosuppressive drugs, initial encounter: Secondary | ICD-10-CM | POA: Diagnosis not present

## 2023-02-02 DIAGNOSIS — Z7985 Long-term (current) use of injectable non-insulin antidiabetic drugs: Secondary | ICD-10-CM

## 2023-02-02 MED ORDER — SEMAGLUTIDE(0.25 OR 0.5MG/DOS) 2 MG/3ML ~~LOC~~ SOPN
0.5000 mg | PEN_INJECTOR | SUBCUTANEOUS | 5 refills | Status: DC
  Filled 2023-02-02 – 2023-02-23 (×2): qty 6, 56d supply, fill #0
  Filled 2023-04-19: qty 6, 56d supply, fill #1

## 2023-02-02 NOTE — Progress Notes (Signed)
Subjective:   Sabrina Woods January 29, 1955 02/02/2023  Chief Complaint  Patient presents with   Medical Management of Chronic Issues    27-month follow up; denies any real concerns for today's visit.    HPI: Sabrina Woods presents today for re-assessment and management of chronic medical conditions.   DIABETES MELLITUS: Sabrina Woods presents for the medical management of diabetes.  Current diabetes medication regimen: Ozempic 0.5 Patient is  adhering to a diabetic diet.  Patient is  exercising regularly.  Patient is  checking BS regularly.  Patient is checking their feet regularly.  Denies polydipsia, polyphagia, polyuria, open wounds or ulcers on feet.  Lab Results  Component Value Date   HGBA1C 6.6 (H) 01/24/2023     Wt Readings from Last 3 Encounters:  02/02/23 184 lb (83.5 kg)  01/24/23 186 lb 9.6 oz (84.6 kg)  01/16/23 187 lb 2 oz (84.9 kg)    Chemotherapy Induced Neuropathy:  Currently controlled with Gabapentin 600mg  TID. She is not driving due to neuropathy of lower extremities. Denies progression of disease currently. She does have labs regularly checked with chemotherapy w/ Oncology using her port.    The following portions of the patient's history were reviewed and updated as appropriate: past medical history, past surgical history, family history, social history, allergies, medications, and problem list.   Patient Active Problem List   Diagnosis Date Noted   Encounter to establish care with new doctor 07/14/2022   Screening for colon cancer 07/14/2022   Encounter for smoking cessation counseling 07/14/2022   Essential (primary) hypertension 01/19/2022   Type 2 diabetes mellitus without complication, without long-term current use of insulin (HCC) 01/19/2022   Fatigue 04/08/2021   S/P mastectomy, right 08/04/2020   Chemotherapy-induced peripheral neuropathy (HCC) 05/20/2020   Malignant neoplasm of upper-outer quadrant of right breast in  female, estrogen receptor positive (HCC) 03/10/2020   Fibroid    Smoker    Depression    Past Medical History:  Diagnosis Date   Acid reflux    Anxiety    Breast cancer (HCC)    Breast cancer (HCC) 2022   Depression    HISTORY OF   Fibroid    PONV (postoperative nausea and vomiting)    Smoker    Past Surgical History:  Procedure Laterality Date   CESAREAN SECTION     X 2   MASTECTOMY     MASTECTOMY WITH AXILLARY LYMPH NODE DISSECTION Right 08/04/2020   Procedure: RIGHT MASTECTOMY WITH RIGHT AXILLARY LYMPH NODE DISSECTION;  Surgeon: [name], MD;  Location: [facility];  Service: General;  Laterality: Right;   PORTACATH PLACEMENT Left 03/17/2020   Procedure: INSERTION PORT-A-CATH;  Surgeon: [name], MD;  Location: [facility];  Service: General;  Laterality: Left;   PORTACATH PLACEMENT Left 08/05/2021   Procedure: PORT PLACEMENT WITH ULTRASOUND GUIDANCE;  Surgeon: [name], MD;  Location: [facility];  Service: General;  Laterality: Left;  LMA   Family History  Problem Relation Age of Onset   Cancer Mother    Breast cancer Mother    Diabetes Mother    Hypertension Mother    Hypertension Father    Hypertension Sister    Hypertension Brother    Outpatient Medications Prior to Visit  Medication Sig Dispense Refill   ALPRAZolam (XANAX) 0.25 MG tablet Take 1 tablet (0.25 mg total) by mouth 2 (two) times daily as needed for anxiety. 60 tablet 3   esomeprazole (NEXIUM) 20  MG capsule Take 20 mg by mouth daily.     gabapentin (NEURONTIN) 600 MG tablet TAKE 1 TABLET BY MOUTH THREE TIMES A DAY 270 tablet 3   lidocaine-prilocaine (EMLA) cream Apply topically once.     ondansetron (ZOFRAN) 8 MG tablet Take 1 tablet (8 mg total) by mouth every 8 (eight) hours as needed for nausea. Take the 3rd day after chemo 30 tablet 3   OVER THE COUNTER MEDICATION Stool softener     prochlorperazine (COMPAZINE) 10 MG tablet Take 1 tablet (10 mg  total) by mouth every 6 (six) hours as needed for nausea or vomiting. 30 tablet 3   nicotine (NICODERM CQ - DOSED IN MG/24 HOURS) 14 mg/24hr patch Place 1 patch (14 mg total) onto the skin daily. 28 patch 0   Semaglutide,0.25 or 0.5MG /DOS, 2 MG/3ML SOPN Inject 0.5 mg into the skin once a week. 6 mL 3   Multiple Vitamin (MULTIVITAMIN) capsule Take 1 capsule by mouth daily.     No facility-administered medications prior to visit.   No Known Allergies   ROS: A complete ROS was performed with pertinent positives/negatives noted in the HPI. The remainder of the ROS are negative.    Objective:   Today's Vitals   02/02/23 0822  BP: 126/71  Pulse: 78  SpO2: 100%  Weight: 184 lb (83.5 kg)  Height: 5\' 6"  (1.676 m)    Physical Exam          GENERAL: Well-appearing, in NAD. Well nourished.  SKIN: Pink, warm and dry. No rash, lesion, ulceration, or ecchymoses.  Head: Normocephalic. NECK: Trachea midline. Full ROM w/o pain or tenderness. RESPIRATORY: Chest wall symmetrical. Respirations even and non-labored. Breath sounds clear to auscultation bilaterally.  CARDIAC: S1, S2 present, regular rate and rhythm without murmur or gallops. Peripheral pulses 2+ bilaterally.  MSK: Muscle tone and strength appropriate for age.  EXTREMITIES: Without clubbing, cyanosis, or edema.  NEUROLOGIC: No motor or sensory deficits. Steady, even gait. C2-C12 intact.  PSYCH/MENTAL STATUS: Alert, oriented x 3. Cooperative, appropriate mood and affect.   Health Maintenance Due  Topic Date Due   OPHTHALMOLOGY EXAM  Never done   Fecal DNA (Cologuard)  Never done   Cervical Cancer Screening (Pap smear)  03/01/2012    No results found for any visits on 02/02/23.       Assessment & Plan:  1. Type 2 diabetes mellitus without complication, without long-term current use of insulin (HCC) (Primary) Well controlled. Continue on Ozempic 0.5mg  weekly. Referral for eye exam placed. Pt doing well with diet. Recent labs  reviewed by PCP.  - Ambulatory referral to Ophthalmology - Semaglutide,0.25 or 0.5MG /DOS, 2 MG/3ML SOPN; Inject 0.5 mg into the skin once a week.  Dispense: 6 mL; Refill: 5  2. Chemotherapy-induced peripheral neuropathy (HCC) Controlled. Continue Gabapentin as prescribed.   3. Healthcare maintenance Pt reminded to complete Cologuard (has kit at her house). Recommend completion of last pap smear as it has been over 12 years since last pap. Pt will schedule with PCP.     Meds ordered this encounter  Medications   Semaglutide,0.25 or 0.5MG /DOS, 2 MG/3ML SOPN    Sig: Inject 0.5 mg into the skin once a week.    Dispense:  6 mL    Refill:  5    Please dispense for 60 day supply if insurance allows.    Supervising Provider:   Tommi Rumps [place], RAYMOND J [[id number]]   Return in about 6 months (around 08/02/2023) for DIABETES CHECK  UP.    Patient to reach out to office if new, worrisome, or unresolved symptoms arise or if no improvement in patient's condition. Patient verbalized understanding and is agreeable to treatment plan. All questions answered to patient's satisfaction.    Hilbert Bible, [place]

## 2023-02-02 NOTE — Patient Instructions (Addendum)
Check Cologuard expiration    Schedule your Pap Smear

## 2023-02-24 ENCOUNTER — Other Ambulatory Visit: Payer: Self-pay

## 2023-02-24 ENCOUNTER — Other Ambulatory Visit (HOSPITAL_BASED_OUTPATIENT_CLINIC_OR_DEPARTMENT_OTHER): Payer: Self-pay

## 2023-02-27 MED FILL — Fosaprepitant Dimeglumine For IV Infusion 150 MG (Base Eq): INTRAVENOUS | Qty: 5 | Status: AC

## 2023-02-28 ENCOUNTER — Inpatient Hospital Stay: Payer: Medicare Other

## 2023-02-28 ENCOUNTER — Inpatient Hospital Stay (HOSPITAL_BASED_OUTPATIENT_CLINIC_OR_DEPARTMENT_OTHER): Payer: Medicare Other | Admitting: Hematology and Oncology

## 2023-02-28 ENCOUNTER — Inpatient Hospital Stay: Payer: Medicare Other | Attending: Hematology and Oncology

## 2023-02-28 VITALS — BP 139/66 | HR 89 | Temp 98.4°F | Resp 18 | Ht 66.0 in | Wt 188.0 lb

## 2023-02-28 DIAGNOSIS — Z5112 Encounter for antineoplastic immunotherapy: Secondary | ICD-10-CM | POA: Insufficient documentation

## 2023-02-28 DIAGNOSIS — Z170 Estrogen receptor positive status [ER+]: Secondary | ICD-10-CM | POA: Insufficient documentation

## 2023-02-28 DIAGNOSIS — E119 Type 2 diabetes mellitus without complications: Secondary | ICD-10-CM | POA: Diagnosis not present

## 2023-02-28 DIAGNOSIS — G620 Drug-induced polyneuropathy: Secondary | ICD-10-CM | POA: Diagnosis not present

## 2023-02-28 DIAGNOSIS — C50411 Malignant neoplasm of upper-outer quadrant of right female breast: Secondary | ICD-10-CM

## 2023-02-28 DIAGNOSIS — R5383 Other fatigue: Secondary | ICD-10-CM

## 2023-02-28 LAB — CMP (CANCER CENTER ONLY)
ALT: 34 U/L (ref 0–44)
AST: 34 U/L (ref 15–41)
Albumin: 4 g/dL (ref 3.5–5.0)
Alkaline Phosphatase: 131 U/L — ABNORMAL HIGH (ref 38–126)
Anion gap: 7 (ref 5–15)
BUN: 7 mg/dL — ABNORMAL LOW (ref 8–23)
CO2: 25 mmol/L (ref 22–32)
Calcium: 9.4 mg/dL (ref 8.9–10.3)
Chloride: 106 mmol/L (ref 98–111)
Creatinine: 0.64 mg/dL (ref 0.44–1.00)
GFR, Estimated: >60 mL/min (ref >60)
Glucose, Bld: 152 mg/dL — ABNORMAL HIGH (ref 70–99)
Potassium: 3.7 mmol/L (ref 3.5–5.1)
Sodium: 138 mmol/L (ref 135–145)
Total Bilirubin: 0.4 mg/dL (ref 0.0–1.2)
Total Protein: 6.9 g/dL (ref 6.5–8.1)

## 2023-02-28 LAB — CBC WITH DIFFERENTIAL (CANCER CENTER ONLY)
Abs Immature Granulocytes: 0.01 10*3/uL (ref 0.00–0.07)
Basophils Absolute: 0.1 10*3/uL (ref 0.0–0.1)
Basophils Relative: 2 %
Eosinophils Absolute: 0.1 10*3/uL (ref 0.0–0.5)
Eosinophils Relative: 3 %
HCT: 40.1 % (ref 36.0–46.0)
Hemoglobin: 13.6 g/dL (ref 12.0–15.0)
Immature Granulocytes: 0 %
Lymphocytes Relative: 21 %
Lymphs Abs: 1.1 10*3/uL (ref 0.7–4.0)
MCH: 31.5 pg (ref 26.0–34.0)
MCHC: 33.9 g/dL (ref 30.0–36.0)
MCV: 92.8 fL (ref 80.0–100.0)
Monocytes Absolute: 0.5 10*3/uL (ref 0.1–1.0)
Monocytes Relative: 9 %
Neutro Abs: 3.4 10*3/uL (ref 1.7–7.7)
Neutrophils Relative %: 65 %
Platelet Count: 145 10*3/uL — ABNORMAL LOW (ref 150–400)
RBC: 4.32 MIL/uL (ref 3.87–5.11)
RDW: 13.3 % (ref 11.5–15.5)
WBC Count: 5.3 10*3/uL (ref 4.0–10.5)
nRBC: 0 % (ref 0.0–0.2)

## 2023-02-28 MED ORDER — DEXAMETHASONE SODIUM PHOSPHATE 10 MG/ML IJ SOLN
10.0000 mg | Freq: Once | INTRAMUSCULAR | Status: AC
Start: 2023-02-28 — End: 2023-02-28
  Administered 2023-02-28: 10 mg via INTRAVENOUS
  Filled 2023-02-28: qty 1

## 2023-02-28 MED ORDER — DIPHENHYDRAMINE HCL 25 MG PO CAPS
25.0000 mg | ORAL_CAPSULE | Freq: Once | ORAL | Status: AC
  Administered 2023-02-28: 25 mg via ORAL
  Filled 2023-02-28: qty 1

## 2023-02-28 MED ORDER — SODIUM CHLORIDE 0.9 % IV SOLN
150.0000 mg | Freq: Once | INTRAVENOUS | Status: AC
  Administered 2023-02-28: 150 mg via INTRAVENOUS
  Filled 2023-02-28: qty 150

## 2023-02-28 MED ORDER — SODIUM CHLORIDE 0.9% FLUSH
10.0000 mL | Freq: Once | INTRAVENOUS | Status: AC
  Administered 2023-02-28: 10 mL

## 2023-02-28 MED ORDER — FAM-TRASTUZUMAB DERUXTECAN-NXKI CHEMO 100 MG IV SOLR
3.2000 mg/kg | Freq: Once | INTRAVENOUS | Status: AC
  Administered 2023-02-28: 300 mg via INTRAVENOUS
  Filled 2023-02-28: qty 15

## 2023-02-28 MED ORDER — PALONOSETRON HCL INJECTION 0.25 MG/5ML
0.2500 mg | Freq: Once | INTRAVENOUS | Status: AC
  Administered 2023-02-28: 0.25 mg via INTRAVENOUS
  Filled 2023-02-28: qty 5

## 2023-02-28 MED ORDER — DEXTROSE 5 % IV SOLN: Freq: Once | INTRAVENOUS | Status: AC

## 2023-02-28 MED ORDER — HEPARIN SOD (PORK) LOCK FLUSH 100 UNIT/ML IV SOLN
500.0000 [IU] | Freq: Once | INTRAVENOUS | Status: AC | PRN
Start: 2023-02-28 — End: 2023-02-28
  Administered 2023-02-28: 500 [IU]

## 2023-02-28 MED ORDER — ACETAMINOPHEN 325 MG PO TABS
650.0000 mg | ORAL_TABLET | Freq: Once | ORAL | Status: AC
  Administered 2023-02-28: 650 mg via ORAL
  Filled 2023-02-28: qty 2

## 2023-02-28 MED ORDER — SODIUM CHLORIDE 0.9% FLUSH
10.0000 mL | INTRAVENOUS | Status: DC | PRN
  Administered 2023-02-28: 10 mL

## 2023-02-28 NOTE — Progress Notes (Signed)
 Patient Care Team: Hilbert Bible, FNP as PCP - General (Family Medicine) [name], MD as Consulting Physician (General Surgery) Sabriyah Puffer, MD as Consulting Physician (Radiation Oncology) [name], MD as Consulting Physician (Hematology and Oncology)  DIAGNOSIS:  Encounter Diagnosis  Name Primary?   Malignant neoplasm of upper-outer quadrant of right breast in female, estrogen receptor positive (HCC) Yes    SUMMARY OF ONCOLOGIC HISTORY: Oncology History  Malignant neoplasm of upper-outer quadrant of right breast in female, estrogen receptor positive (HCC)  03/10/2020 Initial Diagnosis   Palpable right breast mass x6 months.  Mammogram revealed left breast cysts (intraductal papilloma), 11 cm right breast mass and 2 masses 2.6 cm each in the right axilla: Biopsy grade 3 IDC ER 5% weak, PR 0%, Ki-67 25%, HER-2 3+ IHC positive   03/10/2020 Cancer Staging   Staging form: Breast, AJCC 8th Edition - Clinical stage from 03/10/2020: Stage IIIA (cT3, cN1, cM0, G3, ER+, PR-, HER2+) - Signed by [name], MD on 03/10/2020 Stage prefix: Initial diagnosis   03/19/2020 - 07/01/2020 Chemotherapy   TCH Perjeta   08/04/2020 Surgery   Right mastectomy (Dr. 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, high-grade DCIS with necrosis and calcifications, and 2 right axillary lymph nodes positive for metastatic carcinoma   08/04/2020 Cancer Staging   Staging form: Breast, AJCC 8th Edition - Pathologic stage from 08/04/2020: No Stage Recommended (ypTis (DCIS), pN2, cM0) - Signed by Loa Socks, NP on 02/05/2021 Stage prefix: Post-therapy   08/21/2020 - 10/23/2020 Chemotherapy   Discontinued due to peripheral neuropathy Patient is on Treatment Plan : BREAST ADO-Trastuzumab Emtansine (Kadcyla) q21d      08/2020 -  Anti-estrogen oral therapy    Decided not to do antiestrogen therapy because her ER was 0% on final path.   10/06/2020  - 11/04/2020 Radiation Therapy   Adjuvant radiation    Relapse/Recurrence   Right mastectomy area skin changes: Initially treated for shingles but since lesions did not improve skin biopsy was obtained on 07/14/2021: Infiltrating carcinoma consistent with breast origin CK7 and GA TA 3 positive, ER 0%, PR 0%, HER2 2+ by IHC, FISH negative ratio 1.56, copy #2.1   07/26/2021 -  Chemotherapy   Patient is on Treatment Plan : BREAST METASTATIC Fam-Trastuzumab Deruxtecan-nxki (Enhertu) (5.4) q21d     01/14/2022 Imaging   CT Chest: Slight decrease in size of the right lower lobe pulmonary nodule. Stable tiny non solid nodule in the superior segment of left lower lobe. No new pulmonary nodules. Stable surgical changes from right mastectomy and axillary lymph node dissection. No new developing lymph node enlargement or other mass lesion.  Chest port. Fatty liver infiltration. Aortic Atherosclerosis (ICD10-I70.0).     CHIEF COMPLIANT: Follow-up on Enhertu  HISTORY OF PRESENT ILLNESS:  History of Present Illness The patient, with a history of cancer, presents for a routine follow-up. She reports that her hands cramp up when using a tablet or similar devices, and she has to warm them to alleviate the discomfort. She also reports difficulty sleeping due to cold feet, despite using socks and taking gabapentin. She has not been driving recently, but feels ready to start again. She also mentions that she has been experiencing fatigue, which improves slightly each day. She has been managing her diabetes with Ozempic, which has helped to reduce her A1c from 8 to 6.     ALLERGIES:  has no known allergies.  MEDICATIONS:  Current Outpatient Medications  Medication  Sig Dispense Refill   ALPRAZolam (XANAX) 0.25 MG tablet Take 1 tablet (0.25 mg total) by mouth 2 (two) times daily as needed for anxiety. 60 tablet 3   esomeprazole (NEXIUM) 20 MG capsule Take 20 mg by mouth daily.     gabapentin (NEURONTIN) 600 MG  tablet TAKE 1 TABLET BY MOUTH THREE TIMES A DAY 270 tablet 3   lidocaine-prilocaine (EMLA) cream Apply topically once.     ondansetron (ZOFRAN) 8 MG tablet Take 1 tablet (8 mg total) by mouth every 8 (eight) hours as needed for nausea. Take the 3rd day after chemo 30 tablet 3   OVER THE COUNTER MEDICATION Stool softener     prochlorperazine (COMPAZINE) 10 MG tablet Take 1 tablet (10 mg total) by mouth every 6 (six) hours as needed for nausea or vomiting. 30 tablet 3   Semaglutide,0.25 or 0.5MG /DOS, 2 MG/3ML SOPN Inject 0.5 mg into the skin once a week. 6 mL 5   No current facility-administered medications for this visit.    PHYSICAL EXAMINATION: ECOG PERFORMANCE STATUS: 1 - Symptomatic but completely ambulatory  Vitals:   02/28/23 0917  BP: 139/66  Pulse: 89  Resp: 18  Temp: 98.4 F (36.9 C)  SpO2: 99%   Filed Weights   02/28/23 0917  Weight: 188 lb (85.3 kg)     LABORATORY DATA:  I have reviewed the data as listed    Latest Ref Rng & Units 02/28/2023    8:42 AM 01/24/2023    9:12 AM 12/20/2022   11:06 AM  CMP  Glucose 70 - 99 mg/dL 956  213  086   BUN 8 - 23 mg/dL 7  7  10    Creatinine 0.44 - 1.00 mg/dL 5.78  4.69  6.29   Sodium 135 - 145 mmol/L 138  138  136   Potassium 3.5 - 5.1 mmol/L 3.7  3.7  3.6   Chloride 98 - 111 mmol/L 106  105  104   CO2 22 - 32 mmol/L 25  26  25    Calcium 8.9 - 10.3 mg/dL 9.4  9.7  9.4   Total Protein 6.5 - 8.1 g/dL 6.9  7.1  6.6   Total Bilirubin 0.0 - 1.2 mg/dL 0.4  0.5  0.5   Alkaline Phos 38 - 126 U/L 131  134  126   AST 15 - 41 U/L 34  30  33   ALT 0 - 44 U/L 34  28  29     Lab Results  Component Value Date   WBC 5.3 02/28/2023   HGB 13.6 02/28/2023   HCT 40.1 02/28/2023   MCV 92.8 02/28/2023   PLT 145 (L) 02/28/2023   NEUTROABS 3.4 02/28/2023    ASSESSMENT & PLAN:  Malignant neoplasm of upper-outer quadrant of right breast in female, estrogen receptor positive (HCC) 08/04/20: Right mastectomy (Dr. [name]): grade  3, 8.5 cm invasive ductal carcinoma with micropapillary features, high-grade DCIS with necrosis and calcifications, and 2 right axillary lymph nodes positive for metastatic carcinoma   Treatment Plan: 1. Neoadjuvant chemotherapy with TCH Perjeta 6 cycles followed by Kadcyla maintenance discontinued 11/11/2020 because of peripheral neuropathy 2. Followed by mastectomy with sentinel lymph node study 08/04/20 3. Followed by adjuvant radiation therapy 09/18/2020- 11/04/2020 4.  Decided not to do antiestrogen therapy because her ER was 0% on final path. 5.  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  Current treatment: Enhertu ----------------------------------------------------------------------------------------------------------------- Right mastectomy area skin changes: Initially treated for shingles but since lesions did not  improve skin biopsy was obtained on 07/14/2021: Infiltrating carcinoma consistent with breast origin CK7 and GA TA 3 positive, ER 0%, PR 0%, HER2 2+ by IHC, FISH negative ratio 1.56, copy #2.1   CT CAP 08/03/2021: Solid 0.8 cm right lower lobe lung nodule. Bone scan 08/03/2021: No definite evidence of metastatic disease  CT chest on July 21, 2022 demonstrating no evidence of metastatic disease progression . CT CAP 11/11/2022: Small groundglass nodules in the lungs unchanged hepatic steatosis.  No evidence of metastatic disease. Echocardiogram 08/19/2022: EF 60 to 65%    Current treatment: Enhertu (adjusted to every 5 weeks from 11/11/2022) Enhertu toxicities:  1. Fatigue that lasted for 3-5 days 2. nausea: Much improved 3.  Insomnia 4.  Neuropathy: stable 5.  Hair thinning 6. Diabetes: Improved control with Ozembic   Echocardiogram January 2024: EF 70%.   Next scan will be done at the end of April  RTC in 5 weeks for labs, f/u, and Enhertu.     No orders of the defined types were placed in this encounter.  The patient has a good understanding of the overall plan. she agrees with  it. she will call with any problems that may develop before the next visit here. Total time spent: 30 mins including face to face time and time spent for planning, charting and co-ordination of care   Tamsen Meek, MD 02/28/23

## 2023-02-28 NOTE — Patient Instructions (Signed)
 [facility] - A DEPT OF [facility]  Discharge Instructions: Thank you for choosing [facility] to provide your oncology and hematology care.   If you have a lab appointment with the Cancer Center, please go directly to the Cancer Center and check in at the registration area.   Wear comfortable clothing and clothing appropriate for easy access to any Portacath or PICC line.   We strive to give you quality time with your provider. You may need to reschedule your appointment if you arrive late (15 or more minutes).  Arriving late affects you and other patients whose appointments are after yours.  Also, if you miss three or more appointments without notifying the office, you may be dismissed from the clinic at the provider's discretion.      For prescription refill requests, have your pharmacy contact our office and allow 72 hours for refills to be completed.    Today you received the following chemotherapy and/or immunotherapy agents: fam-trastuzumab deruxtecan-nxki      To help prevent nausea and vomiting after your treatment, we encourage you to take your nausea medication as directed.  BELOW ARE SYMPTOMS THAT SHOULD BE REPORTED IMMEDIATELY: *FEVER GREATER THAN 100.4 F (38 C) OR HIGHER *CHILLS OR SWEATING *NAUSEA AND VOMITING THAT IS NOT CONTROLLED WITH YOUR NAUSEA MEDICATION *UNUSUAL SHORTNESS OF BREATH *UNUSUAL BRUISING OR BLEEDING *URINARY PROBLEMS (pain or burning when urinating, or frequent urination) *BOWEL PROBLEMS (unusual diarrhea, constipation, pain near the anus) TENDERNESS IN MOUTH AND THROAT WITH OR WITHOUT PRESENCE OF ULCERS (sore throat, sores in mouth, or a toothache) UNUSUAL RASH, SWELLING OR PAIN  UNUSUAL VAGINAL DISCHARGE OR ITCHING   Items with * indicate a potential emergency and should be followed up as soon as possible or go to the Emergency Department if any problems should occur.  Please show the CHEMOTHERAPY ALERT  CARD or IMMUNOTHERAPY ALERT CARD at check-in to the Emergency Department and triage nurse.  Should you have questions after your visit or need to cancel or reschedule your appointment, please contact [facility] - A DEPT OF [facility]  Dept: [phone]  and follow the prompts.  Office hours are 8:00 a.m. to 4:30 p.m. Monday - Friday. Please note that voicemails left after 4:00 p.m. may not be returned until the following business day.  We are closed weekends and major holidays. You have access to a nurse at all times for urgent questions. Please call the main number to the clinic Dept: [phone] and follow the prompts.   For any non-urgent questions, you may also contact your provider using MyChart. We now offer e-Visits for anyone 32 and older to request care online for non-urgent symptoms. For details visit [email or web address].   Also download the MyChart app! Go to the app store, search "MyChart", open the app, select [place], and log in with your MyChart username and password.

## 2023-02-28 NOTE — Assessment & Plan Note (Signed)
 08/04/20: Right mastectomy (Dr. 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 Neoadjuvant chemotherapy with TCH Perjeta 6 cycles followed by Kadcyla maintenance discontinued 11/11/2020 because of peripheral neuropathy 2. Followed by mastectomy with sentinel lymph node study 08/04/20 3. Followed by adjuvant radiation therapy 09/18/2020- 11/04/2020 4.  Decided not to do antiestrogen therapy because her ER was 0% on final path. 5.  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 Bone scan 08/03/2021: No definite evidence of metastatic disease  CT chest on July 21, 2022 demonstrating no evidence of metastatic disease progression . CT CAP 11/11/2022: Small groundglass nodules in the lungs unchanged hepatic steatosis.  No evidence of metastatic disease. Echocardiogram 08/19/2022: EF 60 to 65%    Current treatment: Enhertu (adjusted to every 5 weeks from 11/11/2022) Enhertu toxicities:  1. Fatigue that lasted for 3-5 days 2. nausea: Much improved 3.  Insomnia 4.  Neuropathy: stable 5.  Hair thinning 6. Diabetes: Improved control with Ozembic   Echocardiogram January 2024: EF 70%.   Next scan will be done at the end of April or first week of May.    RTC in 5 weeks for labs, f/u, and Enhertu.

## 2023-04-03 MED FILL — Fosaprepitant Dimeglumine For IV Infusion 150 MG (Base Eq): INTRAVENOUS | Qty: 5 | Status: AC

## 2023-04-04 ENCOUNTER — Inpatient Hospital Stay: Payer: Medicare Other

## 2023-04-04 ENCOUNTER — Inpatient Hospital Stay: Payer: Medicare Other | Attending: Hematology and Oncology

## 2023-04-04 ENCOUNTER — Inpatient Hospital Stay (HOSPITAL_BASED_OUTPATIENT_CLINIC_OR_DEPARTMENT_OTHER): Payer: Medicare Other | Admitting: Hematology and Oncology

## 2023-04-04 VITALS — BP 145/62 | HR 97 | Temp 98.0°F | Resp 18 | Ht 66.0 in | Wt 187.9 lb

## 2023-04-04 DIAGNOSIS — G620 Drug-induced polyneuropathy: Secondary | ICD-10-CM | POA: Diagnosis not present

## 2023-04-04 DIAGNOSIS — C773 Secondary and unspecified malignant neoplasm of axilla and upper limb lymph nodes: Secondary | ICD-10-CM | POA: Insufficient documentation

## 2023-04-04 DIAGNOSIS — Z170 Estrogen receptor positive status [ER+]: Secondary | ICD-10-CM

## 2023-04-04 DIAGNOSIS — C50411 Malignant neoplasm of upper-outer quadrant of right female breast: Secondary | ICD-10-CM | POA: Diagnosis present

## 2023-04-04 DIAGNOSIS — R911 Solitary pulmonary nodule: Secondary | ICD-10-CM | POA: Insufficient documentation

## 2023-04-04 DIAGNOSIS — E119 Type 2 diabetes mellitus without complications: Secondary | ICD-10-CM | POA: Insufficient documentation

## 2023-04-04 LAB — CMP (CANCER CENTER ONLY)
ALT: 30 U/L (ref 0–44)
AST: 32 U/L (ref 15–41)
Albumin: 4 g/dL (ref 3.5–5.0)
Alkaline Phosphatase: 123 U/L (ref 38–126)
Anion gap: 6 (ref 5–15)
BUN: 9 mg/dL (ref 8–23)
CO2: 25 mmol/L (ref 22–32)
Calcium: 9.4 mg/dL (ref 8.9–10.3)
Chloride: 106 mmol/L (ref 98–111)
Creatinine: 0.66 mg/dL (ref 0.44–1.00)
GFR, Estimated: >60 mL/min (ref >60)
Glucose, Bld: 167 mg/dL — ABNORMAL HIGH (ref 70–99)
Potassium: 3.8 mmol/L (ref 3.5–5.1)
Sodium: 137 mmol/L (ref 135–145)
Total Bilirubin: 0.5 mg/dL (ref 0.0–1.2)
Total Protein: 6.9 g/dL (ref 6.5–8.1)

## 2023-04-04 LAB — CBC WITH DIFFERENTIAL (CANCER CENTER ONLY)
Abs Immature Granulocytes: 0.01 10*3/uL (ref 0.00–0.07)
Basophils Absolute: 0.1 10*3/uL (ref 0.0–0.1)
Basophils Relative: 1 %
Eosinophils Absolute: 0.2 10*3/uL (ref 0.0–0.5)
Eosinophils Relative: 2 %
HCT: 40.6 % (ref 36.0–46.0)
Hemoglobin: 13.9 g/dL (ref 12.0–15.0)
Immature Granulocytes: 0 %
Lymphocytes Relative: 19 %
Lymphs Abs: 1.3 10*3/uL (ref 0.7–4.0)
MCH: 31.9 pg (ref 26.0–34.0)
MCHC: 34.2 g/dL (ref 30.0–36.0)
MCV: 93.1 fL (ref 80.0–100.0)
Monocytes Absolute: 0.6 10*3/uL (ref 0.1–1.0)
Monocytes Relative: 8 %
Neutro Abs: 4.8 10*3/uL (ref 1.7–7.7)
Neutrophils Relative %: 70 %
Platelet Count: 155 10*3/uL (ref 150–400)
RBC: 4.36 MIL/uL (ref 3.87–5.11)
RDW: 13.2 % (ref 11.5–15.5)
WBC Count: 7 10*3/uL (ref 4.0–10.5)
nRBC: 0 % (ref 0.0–0.2)

## 2023-04-04 MED ORDER — FAM-TRASTUZUMAB DERUXTECAN-NXKI CHEMO 100 MG IV SOLR
3.2000 mg/kg | Freq: Once | INTRAVENOUS | Status: AC
  Administered 2023-04-04: 300 mg via INTRAVENOUS
  Filled 2023-04-04: qty 15

## 2023-04-04 MED ORDER — FOSAPREPITANT DIMEGLUMINE INJECTION 150 MG
150.0000 mg | Freq: Once | INTRAVENOUS | Status: AC
  Administered 2023-04-04: 150 mg via INTRAVENOUS
  Filled 2023-04-04: qty 150

## 2023-04-04 MED ORDER — DEXAMETHASONE SODIUM PHOSPHATE 10 MG/ML IJ SOLN
10.0000 mg | Freq: Once | INTRAMUSCULAR | Status: AC
  Administered 2023-04-04: 10 mg via INTRAVENOUS
  Filled 2023-04-04: qty 1

## 2023-04-04 MED ORDER — DIPHENHYDRAMINE HCL 25 MG PO CAPS
25.0000 mg | ORAL_CAPSULE | Freq: Once | ORAL | Status: AC
  Administered 2023-04-04: 25 mg via ORAL
  Filled 2023-04-04: qty 1

## 2023-04-04 MED ORDER — ACETAMINOPHEN 325 MG PO TABS
650.0000 mg | ORAL_TABLET | Freq: Once | ORAL | Status: AC
  Administered 2023-04-04: 650 mg via ORAL
  Filled 2023-04-04: qty 2

## 2023-04-04 MED ORDER — SODIUM CHLORIDE 0.9% FLUSH
10.0000 mL | INTRAVENOUS | Status: DC | PRN
  Administered 2023-04-04: 10 mL

## 2023-04-04 MED ORDER — DEXTROSE 5 % IV SOLN
Freq: Once | INTRAVENOUS | Status: AC
Start: 2023-04-04 — End: 2023-04-04

## 2023-04-04 MED ORDER — HEPARIN SOD (PORK) LOCK FLUSH 100 UNIT/ML IV SOLN
500.0000 [IU] | Freq: Once | INTRAVENOUS | Status: AC | PRN
  Administered 2023-04-04: 500 [IU]

## 2023-04-04 MED ORDER — PALONOSETRON HCL INJECTION 0.25 MG/5ML
0.2500 mg | Freq: Once | INTRAVENOUS | Status: AC
  Administered 2023-04-04: 0.25 mg via INTRAVENOUS
  Filled 2023-04-04: qty 5

## 2023-04-04 NOTE — Progress Notes (Signed)
 Patient Care Team: Hilbert Bible, FNP as PCP - General (Family Medicine) [name], MD as Consulting Physician (General Surgery) Cyndra Puffer, MD as Consulting Physician (Radiation Oncology) [name], MD as Consulting Physician (Hematology and Oncology)  DIAGNOSIS:  Encounter Diagnosis  Name Primary?   Malignant neoplasm of upper-outer quadrant of right breast in female, estrogen receptor positive (HCC) Yes    SUMMARY OF ONCOLOGIC HISTORY: Oncology History  Malignant neoplasm of upper-outer quadrant of right breast in female, estrogen receptor positive (HCC)  03/10/2020 Initial Diagnosis   Palpable right breast mass x6 months.  Mammogram revealed left breast cysts (intraductal papilloma), 11 cm right breast mass and 2 masses 2.6 cm each in the right axilla: Biopsy grade 3 IDC ER 5% weak, PR 0%, Ki-67 25%, HER-2 3+ IHC positive   03/10/2020 Cancer Staging   Staging form: Breast, AJCC 8th Edition - Clinical stage from 03/10/2020: Stage IIIA (cT3, cN1, cM0, G3, ER+, PR-, HER2+) - Signed by [name], MD on 03/10/2020 Stage prefix: Initial diagnosis   03/19/2020 - 07/01/2020 Chemotherapy   TCH Perjeta   08/04/2020 Surgery   Right mastectomy (Dr. 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, high-grade DCIS with necrosis and calcifications, and 2 right axillary lymph nodes positive for metastatic carcinoma   08/04/2020 Cancer Staging   Staging form: Breast, AJCC 8th Edition - Pathologic stage from 08/04/2020: No Stage Recommended (ypTis (DCIS), pN2, cM0) - Signed by Loa Socks, NP on 02/05/2021 Stage prefix: Post-therapy   08/21/2020 - 10/23/2020 Chemotherapy   Discontinued due to peripheral neuropathy Patient is on Treatment Plan : BREAST ADO-Trastuzumab Emtansine (Kadcyla) q21d      08/2020 -  Anti-estrogen oral therapy    Decided not to do antiestrogen therapy because her ER was 0% on final path.   10/06/2020  - 11/04/2020 Radiation Therapy   Adjuvant radiation    Relapse/Recurrence   Right mastectomy area skin changes: Initially treated for shingles but since lesions did not improve skin biopsy was obtained on 07/14/2021: Infiltrating carcinoma consistent with breast origin CK7 and GA TA 3 positive, ER 0%, PR 0%, HER2 2+ by IHC, FISH negative ratio 1.56, copy #2.1   07/26/2021 -  Chemotherapy   Patient is on Treatment Plan : BREAST METASTATIC Fam-Trastuzumab Deruxtecan-nxki (Enhertu) (5.4) q21d     01/14/2022 Imaging   CT Chest: Slight decrease in size of the right lower lobe pulmonary nodule. Stable tiny non solid nodule in the superior segment of left lower lobe. No new pulmonary nodules. Stable surgical changes from right mastectomy and axillary lymph node dissection. No new developing lymph node enlargement or other mass lesion.  Chest port. Fatty liver infiltration. Aortic Atherosclerosis (ICD10-I70.0).     CHIEF COMPLIANT: Follow-up on Enhertu  HISTORY OF PRESENT ILLNESS:   History of Present Illness The patient, with a history of an unspecified condition requiring port-based medication administration and diabetes, reports constipation and numbness and tingling in the fingers, particularly at night. The numbness and tingling are most pronounced in the last two fingers. The patient has been managing her diabetes with Zimpic, which has resulted in a significant improvement in her A1c levels. The patient is due for a CT scan at the end of the month.     ALLERGIES:  has no known allergies.  MEDICATIONS:  Current Outpatient Medications  Medication Sig Dispense Refill   ALPRAZolam (XANAX) 0.25 MG tablet Take 1 tablet (0.25 mg total) by mouth 2 (two) times daily  as needed for anxiety. 60 tablet 3   esomeprazole (NEXIUM) 20 MG capsule Take 20 mg by mouth daily.     gabapentin (NEURONTIN) 600 MG tablet TAKE 1 TABLET BY MOUTH THREE TIMES A DAY 270 tablet 3   lidocaine-prilocaine (EMLA) cream Apply  topically once.     ondansetron (ZOFRAN) 8 MG tablet Take 1 tablet (8 mg total) by mouth every 8 (eight) hours as needed for nausea. Take the 3rd day after chemo 30 tablet 3   OVER THE COUNTER MEDICATION Stool softener     prochlorperazine (COMPAZINE) 10 MG tablet Take 1 tablet (10 mg total) by mouth every 6 (six) hours as needed for nausea or vomiting. 30 tablet 3   Semaglutide,0.25 or 0.5MG /DOS, 2 MG/3ML SOPN Inject 0.5 mg into the skin once a week. 6 mL 5   No current facility-administered medications for this visit.    PHYSICAL EXAMINATION: ECOG PERFORMANCE STATUS: 1 - Symptomatic but completely ambulatory  Vitals:   04/04/23 0907  BP: (!) 145/62  Pulse: 97  Resp: 18  Temp: 98 F (36.7 C)  SpO2: 100%   Filed Weights   04/04/23 0907  Weight: 187 lb 14.4 oz (85.2 kg)      LABORATORY DATA:  I have reviewed the data as listed    Latest Ref Rng & Units 02/28/2023    8:42 AM 01/24/2023    9:12 AM 12/20/2022   11:06 AM  CMP  Glucose 70 - 99 mg/dL 161  096  045   BUN 8 - 23 mg/dL 7  7  10    Creatinine 0.44 - 1.00 mg/dL 4.09  8.11  9.14   Sodium 135 - 145 mmol/L 138  138  136   Potassium 3.5 - 5.1 mmol/L 3.7  3.7  3.6   Chloride 98 - 111 mmol/L 106  105  104   CO2 22 - 32 mmol/L 25  26  25    Calcium 8.9 - 10.3 mg/dL 9.4  9.7  9.4   Total Protein 6.5 - 8.1 g/dL 6.9  7.1  6.6   Total Bilirubin 0.0 - 1.2 mg/dL 0.4  0.5  0.5   Alkaline Phos 38 - 126 U/L 131  134  126   AST 15 - 41 U/L 34  30  33   ALT 0 - 44 U/L 34  28  29     Lab Results  Component Value Date   WBC 7.0 04/04/2023   HGB 13.9 04/04/2023   HCT 40.6 04/04/2023   MCV 93.1 04/04/2023   PLT 155 04/04/2023   NEUTROABS 4.8 04/04/2023    ASSESSMENT & PLAN:  Malignant neoplasm of upper-outer quadrant of right breast in female, estrogen receptor positive (HCC) 08/04/20: Right mastectomy (Dr. 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 Neoadjuvant chemotherapy with TCH Perjeta 6 cycles followed by Kadcyla maintenance discontinued 11/11/2020 because of peripheral neuropathy 2. Followed by mastectomy with sentinel lymph node study 08/04/20 3. Followed by adjuvant radiation therapy 09/18/2020- 11/04/2020 4.  Decided not to do antiestrogen therapy because her ER was 0% on final path. 5.  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  Current treatment: Enhertu ----------------------------------------------------------------------------------------------------------------- Right mastectomy area skin changes: Initially treated for shingles but since lesions did not improve skin biopsy was obtained on 07/14/2021: Infiltrating carcinoma consistent with breast origin CK7 and GA TA 3 positive,  ER 0%, PR 0%, HER2 2+ by IHC, FISH negative ratio 1.56, copy #2.1   CT CAP 08/03/2021: Solid 0.8 cm right lower lobe lung nodule. Bone scan 08/03/2021: No definite evidence of metastatic disease  CT chest on July 21, 2022 demonstrating no evidence of metastatic disease progression . CT CAP 11/11/2022: Small groundglass nodules in the lungs unchanged hepatic steatosis.  No evidence of metastatic disease. Echocardiogram 08/19/2022: EF 60 to 65%    Current treatment: Enhertu (adjusted to every 5 weeks from 11/11/2022) Enhertu toxicities:  1. Fatigue that lasted for 3-5 days 2. nausea: Much improved 3.  Insomnia 4.  Neuropathy: stable most prominent of the lateral aspects of her ring finger and index fingers 5.  Hair thinning 6. Diabetes: Improved control with Ozembic   Echocardiogram January 2024: EF 70%.   Next scan will be done at the end of April   RTC in 5 weeks for labs, f/u, and Enhertu.  ------------------------------------- Assessment and Plan Assessment & Plan Malignant neoplasm of upper-outer quadrant of right breast, estrogen receptor  positive Undergoing treatment for estrogen receptor positive breast cancer in the upper-outer quadrant of the right breast. Experiences peripheral neuropathy symptoms, including nocturnal numbness and tingling in fingers and toes. Issues with port access during treatment require positional adjustments. Blood work is normal; liver function tests pending. CT scan scheduled for May 02, 2023, to assess treatment progress. Discussed potential interventions for neuropathy, such as menthol application to confuse nerve signals and reduce symptoms. - Ensure CT scan is scheduled for May 02, 2023 - Review CT scan results on May 09, 2023 - Consider interventions for peripheral neuropathy if symptoms worsen  Diabetes Diabetes managed with Semaglutide, resulting in significant A1c improvement since January, indicating good glycemic control. - Continue Semaglutide 0.5 MG subcutaneous once a week - Monitor A1c levels regularly      No orders of the defined types were placed in this encounter.  The patient has a good understanding of the overall plan. she agrees with it. she will call with any problems that may develop before the next visit here. Total time spent: 30 mins including face to face time and time spent for planning, charting and co-ordination of care   Tamsen Meek, MD 04/04/23

## 2023-04-04 NOTE — Assessment & Plan Note (Signed)
 08/04/20: Right mastectomy (Dr. 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 Neoadjuvant chemotherapy with TCH Perjeta 6 cycles followed by Kadcyla maintenance discontinued 11/11/2020 because of peripheral neuropathy 2. Followed by mastectomy with sentinel lymph node study 08/04/20 3. Followed by adjuvant radiation therapy 09/18/2020- 11/04/2020 4.  Decided not to do antiestrogen therapy because her ER was 0% on final path. 5.  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 Bone scan 08/03/2021: No definite evidence of metastatic disease  CT chest on July 21, 2022 demonstrating no evidence of metastatic disease progression . CT CAP 11/11/2022: Small groundglass nodules in the lungs unchanged hepatic steatosis.  No evidence of metastatic disease. Echocardiogram 08/19/2022: EF 60 to 65%    Current treatment: Enhertu (adjusted to every 5 weeks from 11/11/2022) Enhertu toxicities:  1. Fatigue that lasted for 3-5 days 2. nausea: Much improved 3.  Insomnia 4.  Neuropathy: stable 5.  Hair thinning 6. Diabetes: Improved control with Ozembic   Echocardiogram January 2024: EF 70%.   Next scan will be done at the end of April   RTC in 5 weeks for labs, f/u, and Enhertu.

## 2023-04-04 NOTE — Patient Instructions (Signed)

## 2023-04-20 ENCOUNTER — Other Ambulatory Visit (HOSPITAL_BASED_OUTPATIENT_CLINIC_OR_DEPARTMENT_OTHER): Payer: Self-pay

## 2023-05-02 ENCOUNTER — Encounter (HOSPITAL_COMMUNITY): Payer: Self-pay

## 2023-05-02 ENCOUNTER — Ambulatory Visit (HOSPITAL_COMMUNITY)
Admission: RE | Admit: 2023-05-02 | Discharge: 2023-05-02 | Disposition: A | Discharge status: Home / Self Care | Source: Ambulatory Visit | Attending: Hematology and Oncology | Admitting: Hematology and Oncology

## 2023-05-02 DIAGNOSIS — Z170 Estrogen receptor positive status [ER+]: Secondary | ICD-10-CM | POA: Insufficient documentation

## 2023-05-02 DIAGNOSIS — C50411 Malignant neoplasm of upper-outer quadrant of right female breast: Secondary | ICD-10-CM | POA: Diagnosis present

## 2023-05-02 MED ORDER — IOHEXOL 300 MG/ML  SOLN
100.0000 mL | Freq: Once | INTRAMUSCULAR | Status: AC | PRN
  Administered 2023-05-02: 100 mL via INTRAVENOUS

## 2023-05-02 MED ORDER — HEPARIN SOD (PORK) LOCK FLUSH 100 UNIT/ML IV SOLN
500.0000 [IU] | Freq: Once | INTRAVENOUS | Status: AC
  Administered 2023-05-02: 500 [IU] via INTRAVENOUS

## 2023-05-02 MED ORDER — SODIUM CHLORIDE (PF) 0.9 % IJ SOLN
INTRAMUSCULAR | Status: AC
  Filled 2023-05-02: qty 50

## 2023-05-02 MED ORDER — HEPARIN SOD (PORK) LOCK FLUSH 100 UNIT/ML IV SOLN
INTRAVENOUS | Status: AC
  Filled 2023-05-02: qty 5

## 2023-05-08 MED FILL — Fosaprepitant Dimeglumine For IV Infusion 150 MG (Base Eq): INTRAVENOUS | Qty: 5 | Status: AC

## 2023-05-09 ENCOUNTER — Inpatient Hospital Stay: Payer: Medicare Other | Attending: Hematology and Oncology

## 2023-05-09 ENCOUNTER — Inpatient Hospital Stay (HOSPITAL_BASED_OUTPATIENT_CLINIC_OR_DEPARTMENT_OTHER): Payer: Medicare Other | Admitting: Hematology and Oncology

## 2023-05-09 ENCOUNTER — Inpatient Hospital Stay: Payer: Medicare Other

## 2023-05-09 VITALS — BP 128/80 | HR 85 | Temp 97.9°F | Resp 18 | Ht 66.0 in | Wt 188.3 lb

## 2023-05-09 DIAGNOSIS — Z5112 Encounter for antineoplastic immunotherapy: Secondary | ICD-10-CM | POA: Diagnosis present

## 2023-05-09 DIAGNOSIS — C773 Secondary and unspecified malignant neoplasm of axilla and upper limb lymph nodes: Secondary | ICD-10-CM | POA: Diagnosis not present

## 2023-05-09 DIAGNOSIS — C50411 Malignant neoplasm of upper-outer quadrant of right female breast: Secondary | ICD-10-CM

## 2023-05-09 DIAGNOSIS — R911 Solitary pulmonary nodule: Secondary | ICD-10-CM | POA: Diagnosis not present

## 2023-05-09 DIAGNOSIS — G620 Drug-induced polyneuropathy: Secondary | ICD-10-CM | POA: Diagnosis not present

## 2023-05-09 DIAGNOSIS — Z170 Estrogen receptor positive status [ER+]: Secondary | ICD-10-CM

## 2023-05-09 DIAGNOSIS — R5383 Other fatigue: Secondary | ICD-10-CM

## 2023-05-09 DIAGNOSIS — I2510 Atherosclerotic heart disease of native coronary artery without angina pectoris: Secondary | ICD-10-CM | POA: Diagnosis not present

## 2023-05-09 LAB — CBC WITH DIFFERENTIAL (CANCER CENTER ONLY)
Abs Immature Granulocytes: 0.02 10*3/uL (ref 0.00–0.07)
Basophils Absolute: 0.1 10*3/uL (ref 0.0–0.1)
Basophils Relative: 1 %
Eosinophils Absolute: 0.1 10*3/uL (ref 0.0–0.5)
Eosinophils Relative: 2 %
HCT: 40.2 % (ref 36.0–46.0)
Hemoglobin: 14 g/dL (ref 12.0–15.0)
Immature Granulocytes: 0 %
Lymphocytes Relative: 16 %
Lymphs Abs: 1.1 10*3/uL (ref 0.7–4.0)
MCH: 31.5 pg (ref 26.0–34.0)
MCHC: 34.8 g/dL (ref 30.0–36.0)
MCV: 90.3 fL (ref 80.0–100.0)
Monocytes Absolute: 0.6 10*3/uL (ref 0.1–1.0)
Monocytes Relative: 9 %
Neutro Abs: 4.9 10*3/uL (ref 1.7–7.7)
Neutrophils Relative %: 72 %
Platelet Count: 144 10*3/uL — ABNORMAL LOW (ref 150–400)
RBC: 4.45 MIL/uL (ref 3.87–5.11)
RDW: 13.3 % (ref 11.5–15.5)
WBC Count: 6.8 10*3/uL (ref 4.0–10.5)
nRBC: 0 % (ref 0.0–0.2)

## 2023-05-09 LAB — CMP (CANCER CENTER ONLY)
ALT: 27 U/L (ref 0–44)
AST: 28 U/L (ref 15–41)
Albumin: 4.2 g/dL (ref 3.5–5.0)
Alkaline Phosphatase: 126 U/L (ref 38–126)
Anion gap: 6 (ref 5–15)
BUN: 12 mg/dL (ref 8–23)
CO2: 26 mmol/L (ref 22–32)
Calcium: 9.8 mg/dL (ref 8.9–10.3)
Chloride: 105 mmol/L (ref 98–111)
Creatinine: 0.63 mg/dL (ref 0.44–1.00)
GFR, Estimated: >60 mL/min (ref >60)
Glucose, Bld: 181 mg/dL — ABNORMAL HIGH (ref 70–99)
Potassium: 3.9 mmol/L (ref 3.5–5.1)
Sodium: 137 mmol/L (ref 135–145)
Total Bilirubin: 0.5 mg/dL (ref 0.0–1.2)
Total Protein: 7.2 g/dL (ref 6.5–8.1)

## 2023-05-09 MED ORDER — PALONOSETRON HCL INJECTION 0.25 MG/5ML
0.2500 mg | Freq: Once | INTRAVENOUS | Status: AC
  Administered 2023-05-09: 0.25 mg via INTRAVENOUS
  Filled 2023-05-09: qty 5

## 2023-05-09 MED ORDER — DEXAMETHASONE SODIUM PHOSPHATE 10 MG/ML IJ SOLN
10.0000 mg | Freq: Once | INTRAMUSCULAR | Status: AC
  Administered 2023-05-09: 10 mg via INTRAVENOUS
  Filled 2023-05-09: qty 1

## 2023-05-09 MED ORDER — ACETAMINOPHEN 325 MG PO TABS
650.0000 mg | ORAL_TABLET | Freq: Once | ORAL | Status: AC
Start: 2023-05-09 — End: 2023-05-09
  Administered 2023-05-09: 650 mg via ORAL
  Filled 2023-05-09: qty 2

## 2023-05-09 MED ORDER — DIPHENHYDRAMINE HCL 25 MG PO CAPS
25.0000 mg | ORAL_CAPSULE | Freq: Once | ORAL | Status: AC
  Administered 2023-05-09: 25 mg via ORAL
  Filled 2023-05-09: qty 1

## 2023-05-09 MED ORDER — SODIUM CHLORIDE 0.9% FLUSH
10.0000 mL | Freq: Once | INTRAVENOUS | Status: AC
  Administered 2023-05-09: 10 mL

## 2023-05-09 MED ORDER — FAM-TRASTUZUMAB DERUXTECAN-NXKI CHEMO 100 MG IV SOLR
3.2000 mg/kg | Freq: Once | INTRAVENOUS | Status: AC
Start: 2023-05-09 — End: 2023-05-09
  Administered 2023-05-09: 300 mg via INTRAVENOUS
  Filled 2023-05-09: qty 15

## 2023-05-09 MED ORDER — FOSAPREPITANT DIMEGLUMINE INJECTION 150 MG
150.0000 mg | Freq: Once | INTRAVENOUS | Status: AC
  Administered 2023-05-09: 150 mg via INTRAVENOUS
  Filled 2023-05-09: qty 150

## 2023-05-09 MED ORDER — DEXTROSE 5 % IV SOLN: Freq: Once | INTRAVENOUS | Status: AC

## 2023-05-09 NOTE — Assessment & Plan Note (Signed)
 08/04/20: Right mastectomy (Dr. 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 Neoadjuvant chemotherapy with [facility] Perjeta  6 cycles followed by Kadcyla  maintenance discontinued 11/11/2020 because of peripheral neuropathy 2. Followed by mastectomy with sentinel lymph node study 08/04/20 3. Followed by adjuvant radiation therapy 09/18/2020- 11/04/2020 4.  Decided not to do antiestrogen therapy because her ER was 0% on final path. 5.  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  Current treatment: Enhertu  ----------------------------------------------------------------------------------------------------------------- Right mastectomy area skin changes: Initially treated for shingles but since lesions did not improve skin biopsy was obtained on 07/14/2021: Infiltrating carcinoma consistent with breast origin CK7 and GA TA 3 positive, ER 0%, PR 0%, HER2 2+ by IHC, FISH negative ratio 1.56, copy #2.1   CT CAP 08/03/2021: Solid 0.8 cm right lower lobe lung nodule. Bone scan 08/03/2021: No definite evidence of metastatic disease  CT chest on July 21, 2022 demonstrating no evidence of metastatic disease progression . CT CAP 11/11/2022: Small groundglass nodules in the lungs unchanged hepatic steatosis.  No evidence of metastatic disease. Echocardiogram 08/19/2022: EF 60 to 65%    Current treatment: Enhertu  (adjusted to every 5 weeks from 11/11/2022) Enhertu  toxicities:  1. Fatigue that lasted for 3-5 days 2. nausea: Much improved 3.  Insomnia 4.  Neuropathy: stable most prominent of the lateral aspects of her ring finger and index fingers 5.  Hair thinning 6. Diabetes: Improved control with Ozembic   Echocardiogram January 2024: EF 70%.   CT CAP 05/02/2023: Occasional small pulmonary nodules unchanged most likely benign, no lymphadenopathy,  hepatic steatosis, prominent left uterine and adnexal varices.  No metastatic disease   RTC in 5 weeks for labs, f/u, and Enhertu .

## 2023-05-09 NOTE — Progress Notes (Signed)
 Patient Care Team: [name], FNP as PCP - General (Family Medicine) Enid Harry, MD as Consulting Physician (General Surgery) Johna Myers, MD as Consulting Physician (Radiation Oncology) Cameron Cea, MD as Consulting Physician (Hematology and Oncology)  DIAGNOSIS:  Encounter Diagnosis  Name Primary?   Malignant neoplasm of upper-outer quadrant of right breast in female, estrogen receptor positive (HCC) Yes    SUMMARY OF ONCOLOGIC HISTORY: Oncology History  Malignant neoplasm of upper-outer quadrant of right breast in female, estrogen receptor positive (HCC)  03/10/2020 Initial Diagnosis   Palpable right breast mass x6 months.  Mammogram revealed left breast cysts (intraductal papilloma), 11 cm right breast mass and 2 masses 2.6 cm each in the right axilla: Biopsy grade 3 IDC ER 5% weak, PR 0%, Ki-67 25%, HER-2 3+ IHC positive   03/10/2020 Cancer Staging   Staging form: Breast, AJCC 8th Edition - Clinical stage from 03/10/2020: Stage IIIA (cT3, cN1, cM0, G3, ER+, PR-, HER2+) - Signed by Cameron Cea, MD on 03/10/2020 Stage prefix: Initial diagnosis   03/19/2020 - 07/01/2020 Chemotherapy   [facility] Perjeta    08/04/2020 Surgery   Right mastectomy (Dr. 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, high-grade DCIS with necrosis and calcifications, and 2 right axillary lymph nodes positive for metastatic carcinoma   08/04/2020 Cancer Staging   Staging form: Breast, AJCC 8th Edition - Pathologic stage from 08/04/2020: No Stage Recommended (ypTis (DCIS), pN2, cM0) - Signed by Percival Brace, NP on 02/05/2021 Stage prefix: Post-therapy   08/21/2020 - 10/23/2020 Chemotherapy   Discontinued due to peripheral neuropathy Patient is on Treatment Plan : BREAST ADO-Trastuzumab Emtansine  (Kadcyla ) q21d      08/2020 -  Anti-estrogen oral therapy    Decided not to do antiestrogen therapy because her ER was 0% on final path.   10/06/2020  - 11/04/2020 Radiation Therapy   Adjuvant radiation    Relapse/Recurrence   Right mastectomy area skin changes: Initially treated for shingles but since lesions did not improve skin biopsy was obtained on 07/14/2021: Infiltrating carcinoma consistent with breast origin CK7 and GA TA 3 positive, ER 0%, PR 0%, HER2 2+ by IHC, FISH negative ratio 1.56, copy #2.1   07/26/2021 -  Chemotherapy   Patient is on Treatment Plan : BREAST METASTATIC Fam-Trastuzumab Deruxtecan-nxki  (Enhertu ) (5.4) q21d     01/14/2022 Imaging   CT Chest: Slight decrease in size of the right lower lobe pulmonary nodule. Stable tiny non solid nodule in the superior segment of left lower lobe. No new pulmonary nodules. Stable surgical changes from right mastectomy and axillary lymph node dissection. No new developing lymph node enlargement or other mass lesion.  Chest port. Fatty liver infiltration. Aortic Atherosclerosis (ICD10-I70.0).     CHIEF COMPLIANT: Follow-up on Enhertu  and to review the recent CT scans  HISTORY OF PRESENT ILLNESS:  History of Present Illness Sabrina Woods "Dot" is a 68 year old female who presents for follow-up in hematology and medical oncology.  She experiences constant fatigue and occasional nausea, requiring antiemetic medication for one day. Lab results show stable hemoglobin at 14 and platelets over 100. Recent imaging reveals small lung nodules, likely benign.  She has a history of small lung nodules and manages her cholesterol with medication, including a baby aspirin. Her current medication regimen includes gabapentin , taken twice daily, which induces sleepiness. She experiences occasional hand cramps and uses Xanax  only for scans and treatments.  She has made significant lifestyle changes, losing weight from 226  pounds in 2013 to 188 pounds currently, maintaining stability over the past six months. No skin rashes or nodules are present. No issues with port access during today's visit.      ALLERGIES:  has no known allergies.  MEDICATIONS:  Current Outpatient Medications  Medication Sig Dispense Refill   ALPRAZolam  (XANAX ) 0.25 MG tablet Take 1 tablet (0.25 mg total) by mouth 2 (two) times daily as needed for anxiety. 60 tablet 3   esomeprazole (NEXIUM) 20 MG capsule Take 20 mg by mouth daily.     gabapentin  (NEURONTIN ) 600 MG tablet TAKE 1 TABLET BY MOUTH THREE TIMES A DAY 270 tablet 3   lidocaine -prilocaine  (EMLA ) cream Apply topically once.     ondansetron  (ZOFRAN ) 8 MG tablet Take 1 tablet (8 mg total) by mouth every 8 (eight) hours as needed for nausea. Take the 3rd day after chemo 30 tablet 3   OVER THE COUNTER MEDICATION Stool softener     prochlorperazine  (COMPAZINE ) 10 MG tablet Take 1 tablet (10 mg total) by mouth every 6 (six) hours as needed for nausea or vomiting. 30 tablet 3   Semaglutide ,0.25 or 0.5MG /DOS, 2 MG/3ML SOPN Inject 0.5 mg into the skin once a week. 6 mL 5   No current facility-administered medications for this visit.    PHYSICAL EXAMINATION: ECOG PERFORMANCE STATUS: 1 - Symptomatic but completely ambulatory  Vitals:   05/09/23 0904  BP: 128/80  Pulse: 85  Resp: 18  Temp: 97.9 F (36.6 C)  SpO2: 100%   Filed Weights   05/09/23 0904  Weight: 188 lb 4.8 oz (85.4 kg)    Physical Exam MEASUREMENTS: Weight- 188 lbs. SKIN: No rash or nodules, skin peeling on the chest wall  (exam performed in the presence of a chaperone)  LABORATORY DATA:  I have reviewed the data as listed    Latest Ref Rng & Units 04/04/2023    8:39 AM 02/28/2023    8:42 AM 01/24/2023    9:12 AM  CMP  Glucose 70 - 99 mg/dL 098  119  147   BUN 8 - 23 mg/dL 9  7  7    Creatinine 0.44 - 1.00 mg/dL 8.29  5.62  1.30   Sodium 135 - 145 mmol/L 137  138  138   Potassium 3.5 - 5.1 mmol/L 3.8  3.7  3.7   Chloride 98 - 111 mmol/L 106  106  105   CO2 22 - 32 mmol/L 25  25  26    Calcium  8.9 - 10.3 mg/dL 9.4  9.4  9.7   Total Protein 6.5 - 8.1 g/dL 6.9  6.9  7.1   Total  Bilirubin 0.0 - 1.2 mg/dL 0.5  0.4  0.5   Alkaline Phos 38 - 126 U/L 123  131  134   AST 15 - 41 U/L 32  34  30   ALT 0 - 44 U/L 30  34  28     Lab Results  Component Value Date   WBC 6.8 05/09/2023   HGB 14.0 05/09/2023   HCT 40.2 05/09/2023   MCV 90.3 05/09/2023   PLT 144 (L) 05/09/2023   NEUTROABS 4.9 05/09/2023    ASSESSMENT & PLAN:  Malignant neoplasm of upper-outer quadrant of right breast in female, estrogen receptor positive (HCC) 08/04/20: Right mastectomy (Dr. 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  Neoadjuvant chemotherapy with [facility] Perjeta  6 cycles followed by Kadcyla  maintenance discontinued 11/11/2020 because of peripheral neuropathy 2. Followed by mastectomy with sentinel lymph node study 08/04/20 3. Followed by adjuvant radiation therapy 09/18/2020- 11/04/2020 4.  Decided not to do antiestrogen therapy because her ER was 0% on final path. 5.  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  Current treatment: Enhertu  ----------------------------------------------------------------------------------------------------------------- Right mastectomy area skin changes: Initially treated for shingles but since lesions did not improve skin biopsy was obtained on 07/14/2021: Infiltrating carcinoma consistent with breast origin CK7 and GA TA 3 positive, ER 0%, PR 0%, HER2 2+ by IHC, FISH negative ratio 1.56, copy #2.1   CT CAP 08/03/2021: Solid 0.8 cm right lower lobe lung nodule. Bone scan 08/03/2021: No definite evidence of metastatic disease  CT chest on July 21, 2022 demonstrating no evidence of metastatic disease progression . CT CAP 11/11/2022: Small groundglass nodules in the lungs unchanged hepatic steatosis.  No evidence of metastatic disease. Echocardiogram 08/19/2022: EF 60 to 65%    Current treatment:  Enhertu  (adjusted to every 5 weeks from 11/11/2022, adjusted to every 6 weeks on 05/09/2023) Enhertu  toxicities:  1. Fatigue that lasted for 3-5 days 2. nausea: Much improved 3.  Insomnia 4.  Neuropathy: stable most prominent of the lateral aspects of her ring finger and index fingers 5.  Hair thinning 6. Diabetes: Improved control with Ozembic   Echocardiogram January 2024: EF 70%.   CT CAP 05/02/2023: Occasional small pulmonary nodules unchanged most likely benign, no lymphadenopathy, hepatic steatosis, prominent left uterine and adnexal varices.  No metastatic disease   We discussed the scan results and made a decision to move her treatments to every 6 weeks RTC in 6 weeks for labs, f/u, and Enhertu .    Assessment & Plan Cancer treatment follow-up Cancer treatment ongoing with favorable response. Imaging clear, labs stable. Treatment schedule adjusted to every six weeks. Long-term plan uncertain but current approach appropriate. - Continue treatment regimen every six weeks. - Monitor with imaging every six months. - Discuss changes in symptoms or side effects.  Coronary artery disease Coronary artery disease noted on CT scans.   Management includes cholesterol control and considering daily aspirin therapy. - Consider daily aspirin therapy. - Encourage healthy diet and regular exercise.   No orders of the defined types were placed in this encounter.  The patient has a good understanding of the overall plan. she agrees with it. she will call with any problems that may develop before the next visit here. Total time spent: 30 mins including face to face time and time spent for planning, charting and co-ordination of care   Margert Sheerer, MD 05/09/23

## 2023-05-10 ENCOUNTER — Telehealth: Payer: Self-pay | Admitting: Hematology and Oncology

## 2023-05-10 NOTE — Telephone Encounter (Signed)
 Confirmed with pt scheduled appt date and time

## 2023-05-12 ENCOUNTER — Encounter (HOSPITAL_BASED_OUTPATIENT_CLINIC_OR_DEPARTMENT_OTHER): Payer: Self-pay | Admitting: Family Medicine

## 2023-05-15 NOTE — Telephone Encounter (Signed)
 Please see mychart message sent by pt and advise.

## 2023-05-17 ENCOUNTER — Other Ambulatory Visit (HOSPITAL_BASED_OUTPATIENT_CLINIC_OR_DEPARTMENT_OTHER): Payer: Self-pay | Admitting: Family Medicine

## 2023-05-17 ENCOUNTER — Other Ambulatory Visit (HOSPITAL_BASED_OUTPATIENT_CLINIC_OR_DEPARTMENT_OTHER): Payer: Self-pay

## 2023-05-17 DIAGNOSIS — E119 Type 2 diabetes mellitus without complications: Secondary | ICD-10-CM

## 2023-05-17 MED ORDER — SEMAGLUTIDE (1 MG/DOSE) 4 MG/3ML ~~LOC~~ SOPN
1.0000 mg | PEN_INJECTOR | SUBCUTANEOUS | 3 refills | Status: DC
  Filled 2023-05-17: qty 6, 56d supply, fill #0
  Filled 2023-07-10: qty 6, 56d supply, fill #1

## 2023-06-13 ENCOUNTER — Ambulatory Visit: Payer: Medicare Other | Admitting: Adult Health

## 2023-06-13 ENCOUNTER — Other Ambulatory Visit: Payer: Medicare Other

## 2023-06-13 ENCOUNTER — Ambulatory Visit: Payer: Medicare Other

## 2023-06-20 ENCOUNTER — Inpatient Hospital Stay

## 2023-06-20 ENCOUNTER — Encounter: Payer: Self-pay | Admitting: Adult Health

## 2023-06-20 ENCOUNTER — Inpatient Hospital Stay: Attending: Hematology and Oncology

## 2023-06-20 ENCOUNTER — Inpatient Hospital Stay (HOSPITAL_BASED_OUTPATIENT_CLINIC_OR_DEPARTMENT_OTHER): Admitting: Adult Health

## 2023-06-20 VITALS — BP 159/75 | HR 85 | Temp 97.4°F | Resp 18 | Ht 66.0 in | Wt 189.7 lb

## 2023-06-20 VITALS — BP 133/79 | HR 80 | Temp 98.2°F | Resp 16

## 2023-06-20 DIAGNOSIS — I10 Essential (primary) hypertension: Secondary | ICD-10-CM | POA: Diagnosis not present

## 2023-06-20 DIAGNOSIS — I700 Atherosclerosis of aorta: Secondary | ICD-10-CM | POA: Insufficient documentation

## 2023-06-20 DIAGNOSIS — Z5112 Encounter for antineoplastic immunotherapy: Secondary | ICD-10-CM | POA: Insufficient documentation

## 2023-06-20 DIAGNOSIS — Z170 Estrogen receptor positive status [ER+]: Secondary | ICD-10-CM

## 2023-06-20 DIAGNOSIS — E119 Type 2 diabetes mellitus without complications: Secondary | ICD-10-CM | POA: Insufficient documentation

## 2023-06-20 DIAGNOSIS — C50411 Malignant neoplasm of upper-outer quadrant of right female breast: Secondary | ICD-10-CM

## 2023-06-20 DIAGNOSIS — Z09 Encounter for follow-up examination after completed treatment for conditions other than malignant neoplasm: Secondary | ICD-10-CM

## 2023-06-20 DIAGNOSIS — R5383 Other fatigue: Secondary | ICD-10-CM

## 2023-06-20 LAB — CMP (CANCER CENTER ONLY)
ALT: 23 U/L (ref 0–44)
AST: 25 U/L (ref 15–41)
Albumin: 4 g/dL (ref 3.5–5.0)
Alkaline Phosphatase: 113 U/L (ref 38–126)
Anion gap: 7 (ref 5–15)
BUN: 9 mg/dL (ref 8–23)
CO2: 25 mmol/L (ref 22–32)
Calcium: 9.4 mg/dL (ref 8.9–10.3)
Chloride: 105 mmol/L (ref 98–111)
Creatinine: 0.64 mg/dL (ref 0.44–1.00)
GFR, Estimated: >60 mL/min (ref >60)
Glucose, Bld: 145 mg/dL — ABNORMAL HIGH (ref 70–99)
Potassium: 3.9 mmol/L (ref 3.5–5.1)
Sodium: 137 mmol/L (ref 135–145)
Total Bilirubin: 0.5 mg/dL (ref 0.0–1.2)
Total Protein: 7 g/dL (ref 6.5–8.1)

## 2023-06-20 LAB — CBC WITH DIFFERENTIAL (CANCER CENTER ONLY)
Abs Immature Granulocytes: 0.01 10*3/uL (ref 0.00–0.07)
Basophils Absolute: 0.1 10*3/uL (ref 0.0–0.1)
Basophils Relative: 1 %
Eosinophils Absolute: 0.1 10*3/uL (ref 0.0–0.5)
Eosinophils Relative: 2 %
HCT: 38.9 % (ref 36.0–46.0)
Hemoglobin: 13.5 g/dL (ref 12.0–15.0)
Immature Granulocytes: 0 %
Lymphocytes Relative: 20 %
Lymphs Abs: 1.2 10*3/uL (ref 0.7–4.0)
MCH: 31.7 pg (ref 26.0–34.0)
MCHC: 34.7 g/dL (ref 30.0–36.0)
MCV: 91.3 fL (ref 80.0–100.0)
Monocytes Absolute: 0.5 10*3/uL (ref 0.1–1.0)
Monocytes Relative: 8 %
Neutro Abs: 4.1 10*3/uL (ref 1.7–7.7)
Neutrophils Relative %: 69 %
Platelet Count: 143 10*3/uL — ABNORMAL LOW (ref 150–400)
RBC: 4.26 MIL/uL (ref 3.87–5.11)
RDW: 13.2 % (ref 11.5–15.5)
WBC Count: 5.9 10*3/uL (ref 4.0–10.5)
nRBC: 0 % (ref 0.0–0.2)

## 2023-06-20 MED ORDER — DEXTROSE 5 % IV SOLN: Freq: Once | INTRAVENOUS | Status: AC

## 2023-06-20 MED ORDER — DIPHENHYDRAMINE HCL 25 MG PO CAPS
25.0000 mg | ORAL_CAPSULE | Freq: Once | ORAL | Status: AC
  Administered 2023-06-20: 25 mg via ORAL
  Filled 2023-06-20: qty 1

## 2023-06-20 MED ORDER — HEPARIN SOD (PORK) LOCK FLUSH 100 UNIT/ML IV SOLN
500.0000 [IU] | Freq: Once | INTRAVENOUS | Status: AC | PRN
  Administered 2023-06-20: 500 [IU]

## 2023-06-20 MED ORDER — PALONOSETRON HCL INJECTION 0.25 MG/5ML
0.2500 mg | Freq: Once | INTRAVENOUS | Status: AC
  Administered 2023-06-20: 0.25 mg via INTRAVENOUS
  Filled 2023-06-20: qty 5

## 2023-06-20 MED ORDER — DEXAMETHASONE SODIUM PHOSPHATE 10 MG/ML IJ SOLN
10.0000 mg | Freq: Once | INTRAMUSCULAR | Status: AC
  Administered 2023-06-20: 10 mg via INTRAVENOUS
  Filled 2023-06-20: qty 1

## 2023-06-20 MED ORDER — SODIUM CHLORIDE 0.9 % IV SOLN
150.0000 mg | Freq: Once | INTRAVENOUS | Status: AC
  Administered 2023-06-20: 150 mg via INTRAVENOUS
  Filled 2023-06-20: qty 150

## 2023-06-20 MED ORDER — SODIUM CHLORIDE 0.9% FLUSH
10.0000 mL | INTRAVENOUS | Status: DC | PRN
Start: 2023-06-20 — End: 2023-06-20
  Administered 2023-06-20: 10 mL

## 2023-06-20 MED ORDER — ACETAMINOPHEN 325 MG PO TABS
650.0000 mg | ORAL_TABLET | Freq: Once | ORAL | Status: AC
  Administered 2023-06-20: 650 mg via ORAL
  Filled 2023-06-20: qty 2

## 2023-06-20 MED ORDER — SODIUM CHLORIDE 0.9% FLUSH
10.0000 mL | Freq: Once | INTRAVENOUS | Status: AC
  Administered 2023-06-20: 10 mL

## 2023-06-20 MED ORDER — FAM-TRASTUZUMAB DERUXTECAN-NXKI CHEMO 100 MG IV SOLR
3.2000 mg/kg | Freq: Once | INTRAVENOUS | Status: AC
  Administered 2023-06-20: 300 mg via INTRAVENOUS
  Filled 2023-06-20: qty 15

## 2023-06-20 NOTE — Progress Notes (Signed)
 [facility] Cancer Follow up:    Sabrina Bays, FNP [address] [place] [address]   DIAGNOSIS:  Cancer Staging  Malignant neoplasm of upper-outer quadrant of right breast in female, estrogen receptor positive (HCC) Staging form: Breast, AJCC 8th Edition - Clinical stage from 03/10/2020: Stage IIIA (cT3, cN1, cM0, G3, ER+, PR-, HER2+) - Signed by Cameron Cea, MD on 03/10/2020 Stage prefix: Initial diagnosis - Pathologic stage from 08/04/2020: No Stage Recommended (ypTis (DCIS), pN2, cM0) - Signed by Percival Brace, NP on 02/05/2021 Stage prefix: Post-therapy    SUMMARY OF ONCOLOGIC HISTORY: Oncology History  Malignant neoplasm of upper-outer quadrant of right breast in female, estrogen receptor positive (HCC)  03/10/2020 Initial Diagnosis   Palpable right breast mass x6 months.  Mammogram revealed left breast cysts (intraductal papilloma), 11 cm right breast mass and 2 masses 2.6 cm each in the right axilla: Biopsy grade 3 IDC ER 5% weak, PR 0%, Ki-67 25%, HER-2 3+ IHC positive   03/10/2020 Cancer Staging   Staging form: Breast, AJCC 8th Edition - Clinical stage from 03/10/2020: Stage IIIA (cT3, cN1, cM0, G3, ER+, PR-, HER2+) - Signed by Cameron Cea, MD on 03/10/2020 Stage prefix: Initial diagnosis   03/19/2020 - 07/01/2020 Chemotherapy   [facility] Perjeta    08/04/2020 Surgery   Right mastectomy (Dr. 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, high-grade DCIS with necrosis and calcifications, and 2 right axillary lymph nodes positive for metastatic carcinoma   08/04/2020 Cancer Staging   Staging form: Breast, AJCC 8th Edition - Pathologic stage from 08/04/2020: No Stage Recommended (ypTis (DCIS), pN2, cM0) - Signed by Percival Brace, NP on 02/05/2021 Stage prefix: Post-therapy   08/21/2020 - 10/23/2020 Chemotherapy   Discontinued due to peripheral neuropathy Patient is on Treatment Plan  : BREAST ADO-Trastuzumab Emtansine  (Kadcyla ) q21d      08/2020 -  Anti-estrogen oral therapy    Decided not to do antiestrogen therapy because her ER was 0% on final path.   10/06/2020 - 11/04/2020 Radiation Therapy   Adjuvant radiation    Relapse/Recurrence   Right mastectomy area skin changes: Initially treated for shingles but since lesions did not improve skin biopsy was obtained on 07/14/2021: Infiltrating carcinoma consistent with breast origin CK7 and GA TA 3 positive, ER 0%, PR 0%, HER2 2+ by IHC, FISH negative ratio 1.56, copy #2.1   07/26/2021 -  Chemotherapy   Patient is on Treatment Plan : BREAST METASTATIC Fam-Trastuzumab Deruxtecan-nxki  (Enhertu ) (5.4) q21d     01/14/2022 Imaging   CT Chest: Slight decrease in size of the right lower lobe pulmonary nodule. Stable tiny non solid nodule in the superior segment of left lower lobe. No new pulmonary nodules. Stable surgical changes from right mastectomy and axillary lymph node dissection. No new developing lymph node enlargement or other mass lesion.  Chest port. Fatty liver infiltration. Aortic Atherosclerosis (ICD10-I70.0).     CURRENT THERAPY: Enhertu   INTERVAL HISTORY:  Discussed the use of AI scribe software for clinical note transcription with the patient, who gave verbal consent to proceed.  History of Present Illness Sabrina Woods is a 68 year old female with a history of cancer who presents for follow-up and evaluation prior to receiving INHER2 therapy.  She is undergoing treatment with Enhertu , which causes significant fatigue, loss of appetite, and constipation, especially post-treatment. Her treatment port is functioning well.  Her last imaging in May showed favorable results, with the next  imaging scheduled for November, following a six-month interval unless new symptoms develop.  She experiences occasional palpitations but denies cough, shortness of breath, chest pain, or abdominal pain. Bowel movements are  generally good, with some constipation after treatments.  Her medication regimen includes an increased dose of Ozempic  from 0.5 mg to 1.0 mg, yet she experiences weight gain despite increased physical activity.  She has a history of aortic atherosclerosis and elevated heart risk factors, including hypertension, diabetes, and tobacco use. She takes baby aspirin and consulted a cardiologist last year. Her lipid panel was normal, but concerns about coronary artery calcifications and aortic atherosclerosis remain. Her PCP declined to prescribe medication for her cholesterol according to Sabrina Woods and her husband.    The 10-year ASCVD risk score (Arnett DK, et al., 2019) is: 31.9%   Values used to calculate the score:     Age: 52 years     Clincally relevant sex: Female     Is Non-Hispanic African American: No     Diabetic: Yes     Tobacco smoker: Yes     Systolic Blood Pressure: 159 mmHg     Is BP treated: No     HDL Cholesterol: 57 mg/dL     Total Cholesterol: 176 mg/dL  This risk score does not account for the potentially cardiotoxic medications that Sabrina Woods receives for her breast cancer.       Patient Active Problem List   Diagnosis Date Noted   Aortic atherosclerosis (HCC) 06/20/2023   Encounter to establish care with new doctor 07/14/2022   Screening for colon cancer 07/14/2022   Encounter for smoking cessation counseling 07/14/2022   Essential (primary) hypertension 01/19/2022   Type 2 diabetes mellitus without complication, without long-term current use of insulin (HCC) 01/19/2022   Fatigue 04/08/2021   S/P mastectomy, right 08/04/2020   Chemotherapy-induced peripheral neuropathy (HCC) 05/20/2020   Malignant neoplasm of upper-outer quadrant of right breast in female, estrogen receptor positive (HCC) 03/10/2020   Fibroid    Smoker    Depression     has no known allergies.  MEDICAL HISTORY: Past Medical History:  Diagnosis Date   Acid reflux    Anxiety    Breast cancer (HCC)     Breast cancer (HCC) 2022   Depression    HISTORY OF   Fibroid    PONV (postoperative nausea and vomiting)    Smoker     SURGICAL HISTORY: Past Surgical History:  Procedure Laterality Date   CESAREAN SECTION     X 2   MASTECTOMY     MASTECTOMY WITH AXILLARY LYMPH NODE DISSECTION Right 08/04/2020   Procedure: RIGHT MASTECTOMY WITH RIGHT AXILLARY LYMPH NODE DISSECTION;  Surgeon: Enid Harry, MD;  Location: [facility];  Service: General;  Laterality: Right;   PORTACATH PLACEMENT Left 03/17/2020   Procedure: INSERTION PORT-A-CATH;  Surgeon: Enid Harry, MD;  Location: [facility];  Service: General;  Laterality: Left;   PORTACATH PLACEMENT Left 08/05/2021   Procedure: PORT PLACEMENT WITH ULTRASOUND GUIDANCE;  Surgeon: Enid Harry, MD;  Location: [facility];  Service: General;  Laterality: Left;  LMA    SOCIAL HISTORY: Social History   Socioeconomic History   Marital status: Married    Spouse name: Not on file   Number of children: Not on file   Years of education: Not on file   Highest education level: Some college, no degree  Occupational History   Not on file  Tobacco Use   Smoking status: Every  Day    Current packs/day: 1.00    Average packs/day: 1 pack/day for 50.5 years (50.5 ttl pk-yrs)    Types: Cigarettes    Start date: 1975   Smokeless tobacco: Never   Tobacco comments:    Using nicotine  patch; currently smoking 2-3 cigs per day as of 07/14/22 ep  Vaping Use   Vaping status: Never Used  Substance and Sexual Activity   Alcohol use: Not Currently   Drug use: Never   Sexual activity: Yes    Birth control/protection: Post-menopausal  Other Topics Concern   Not on file  Social History Narrative   Right handed    Caffeine- 5-6 cups per day    Social Drivers of Health   Financial Resource Strain: Low Risk  (08/01/2022)   Overall Financial Resource Strain (CARDIA)    Difficulty of Paying Living Expenses: Not hard at  all  Food Insecurity: No Food Insecurity (08/01/2022)   Hunger Vital Sign    Worried About Running Out of Food in the Last Year: Never true    Ran Out of Food in the Last Year: Never true  Transportation Needs: No Transportation Needs (08/01/2022)   PRAPARE - Administrator, Civil Service (Medical): No    Lack of Transportation (Non-Medical): No  Physical Activity: Insufficiently Active (08/01/2022)   Exercise Vital Sign    Days of Exercise per Week: 3 days    Minutes of Exercise per Session: 20 min  Stress: No Stress Concern Present (08/01/2022)   [employer] of Occupational Health - Occupational Stress Questionnaire    Feeling of Stress : Only a little  Social Connections: Moderately Isolated (08/01/2022)   Social Connection and Isolation Panel    Frequency of Communication with Friends and Family: More than three times a week    Frequency of Social Gatherings with Friends and Family: Twice a week    Attends Religious Services: Never    Database administrator or Organizations: No    Attends Engineer, structural: Not on file    Marital Status: Married  Catering manager Violence: Not At Risk (02/02/2023)   Humiliation, Afraid, Rape, and Kick questionnaire    Fear of Current or Ex-Partner: No    Emotionally Abused: No    Physically Abused: No    Sexually Abused: No    FAMILY HISTORY: Family History  Problem Relation Age of Onset   Cancer Mother    Breast cancer Mother    Diabetes Mother    Hypertension Mother    Hypertension Father    Hypertension Sister    Hypertension Brother     Review of Systems  Constitutional:  Negative for appetite change, chills, fatigue, fever and unexpected weight change.  HENT:   Negative for hearing loss, lump/mass and trouble swallowing.   Eyes:  Negative for eye problems and icterus.  Respiratory:  Negative for chest tightness, cough and shortness of breath.   Cardiovascular:  Negative for chest pain, leg swelling and  palpitations.  Gastrointestinal:  Negative for abdominal distention, abdominal pain, constipation, diarrhea, nausea and vomiting.  Endocrine: Negative for hot flashes.  Genitourinary:  Negative for difficulty urinating.   Musculoskeletal:  Negative for arthralgias.  Skin:  Negative for itching and rash.  Neurological:  Negative for dizziness, extremity weakness, headaches and numbness.  Hematological:  Negative for adenopathy. Does not bruise/bleed easily.  Psychiatric/Behavioral:  Negative for depression. The patient is not nervous/anxious.       PHYSICAL EXAMINATION  Vitals:   06/20/23 0911  BP: (!) 159/75  Pulse: 85  Resp: 18  Temp: (!) 97.4 F (36.3 C)  SpO2: 100%    Physical Exam Constitutional:      General: She is not in acute distress.    Appearance: Normal appearance. She is not toxic-appearing.  HENT:     Head: Normocephalic and atraumatic.     Mouth/Throat:     Mouth: Mucous membranes are moist.     Pharynx: Oropharynx is clear. No oropharyngeal exudate or posterior oropharyngeal erythema.   Eyes:     General: No scleral icterus.   Cardiovascular:     Rate and Rhythm: Normal rate and regular rhythm.     Pulses: Normal pulses.     Heart sounds: Normal heart sounds.  Pulmonary:     Effort: Pulmonary effort is normal.     Breath sounds: Normal breath sounds.  Abdominal:     General: Abdomen is flat. Bowel sounds are normal. There is no distension.     Palpations: Abdomen is soft.     Tenderness: There is no abdominal tenderness.   Musculoskeletal:        General: No swelling.     Cervical back: Neck supple.  Lymphadenopathy:     Cervical: No cervical adenopathy.   Skin:    General: Skin is warm and dry.     Findings: No rash.   Neurological:     General: No focal deficit present.     Mental Status: She is alert.   Psychiatric:        Mood and Affect: Mood normal.        Behavior: Behavior normal.     LABORATORY DATA:  CBC     Component Value Date/Time   WBC 5.9 06/20/2023 0849   WBC 6.8 08/18/2021 0934   RBC 4.26 06/20/2023 0849   HGB 13.5 06/20/2023 0849   HCT 38.9 06/20/2023 0849   PLT 143 (L) 06/20/2023 0849   MCV 91.3 06/20/2023 0849   MCH 31.7 06/20/2023 0849   MCHC 34.7 06/20/2023 0849   RDW 13.2 06/20/2023 0849   LYMPHSABS 1.2 06/20/2023 0849   MONOABS 0.5 06/20/2023 0849   EOSABS 0.1 06/20/2023 0849   BASOSABS 0.1 06/20/2023 0849    CMP     Component Value Date/Time   NA 137 06/20/2023 0849   K 3.9 06/20/2023 0849   CL 105 06/20/2023 0849   CO2 25 06/20/2023 0849   GLUCOSE 145 (H) 06/20/2023 0849   BUN 9 06/20/2023 0849   CREATININE 0.64 06/20/2023 0849   CALCIUM  9.4 06/20/2023 0849   PROT 7.0 06/20/2023 0849   ALBUMIN 4.0 06/20/2023 0849   AST 25 06/20/2023 0849   ALT 23 06/20/2023 0849   ALKPHOS 113 06/20/2023 0849   BILITOT 0.5 06/20/2023 0849   GFRNONAA >60 06/20/2023 0849     ASSESSMENT and THERAPY PLAN:   Malignant neoplasm of upper-outer quadrant of right breast in female, estrogen receptor positive (HCC) 08/04/20: Right mastectomy (Dr. 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 Neoadjuvant chemotherapy with [facility] Perjeta  6 cycles followed by Kadcyla  maintenance discontinued 11/11/2020 because of peripheral neuropathy 2. Followed by mastectomy with sentinel lymph node study 08/04/20 3. Followed by adjuvant radiation therapy 09/18/2020- 11/04/2020 4.  Decided not to do antiestrogen therapy because her ER was 0% on final path.  5.  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  Current treatment: Enhertu  ----------------------------------------------------------------------------------------------------------------- Right mastectomy area skin changes: Initially treated for shingles but since lesions did not  improve skin biopsy was obtained on 07/14/2021: Infiltrating carcinoma consistent with breast origin CK7 and GA TA 3 positive, ER 0%, PR 0%, HER2 2+ by IHC, FISH negative ratio 1.56, copy #2.1   CT CAP 08/03/2021: Solid 0.8 cm right lower lobe lung nodule. Bone scan 08/03/2021: No definite evidence of metastatic disease  CT chest on July 21, 2022 demonstrating no evidence of metastatic disease progression . CT CAP 11/11/2022: Small groundglass nodules in the lungs unchanged hepatic steatosis.  No evidence of metastatic disease. Echocardiogram 08/19/2022: EF 60 to 65%    Current treatment: Enhertu  (adjusted to every 5 weeks from 11/11/2022) Enhertu  toxicities:  1. Fatigue that lasted for 3-5 days 2. Nausea: Much improved 3. Insomnia 4. Neuropathy: stable most prominent of the lateral aspects of her ring finger and index fingers 5. Hair thinning 6. Diabetes: On Ozembic   Echocardiogram January 2024: EF 70%.   CT CAP 05/02/2023: Occasional small pulmonary nodules unchanged most likely benign, no lymphadenopathy, hepatic steatosis, prominent left uterine and adnexal varices.  No metastatic disease   Assessment and Plan Assessment & Plan Breast cancer with ongoing treatment Undergoing Enhertu  with favorable imaging in May. Next imaging in November unless symptoms arise. Fatigue and appetite loss post-treatment. Port functional. Stable blood counts, slight thrombocytopenia. - Continue Enhertu  treatment. - Schedule imaging in November unless new symptoms develop.  Aortic atherosclerosis Aortic atherosclerosis with coronary calcifications. No statin initiated by primary care. Enhertu  may affect cardiac risk. Cardiologist consultation recommended. ASCVD 10 year risk 31.9% - Refer to cardio-oncology for evaluation and management. - Coordinate with cardiologist for cardiac medication.  Type 2 diabetes mellitus Managed with Ozempic , increased to 1.0 mg. Reports weight gain despite increased dosage and  activity. Advised to continue regimen and monitor response. - Monitor weight and response. - Consider alternative medication if no improvement after 2-4 more injections.   RTC in July as scheduled for labs, f/u with Dr. Gudena, and her next treatment.    All questions were answered. The patient knows to call the clinic with any problems, questions or concerns. We can certainly see the patient much sooner if necessary.  Total encounter time:30 minutes*in face-to-face visit time, chart review, lab review, care coordination, order entry, and documentation of the encounter time.  Alwin Baars, NP 06/20/23 9:42 AM Medical Oncology and Hematology [facility] [address] [place], [place] [address] Tel. [phone]    Fax. [phone]  *Total Encounter Time as defined by the Centers for Medicare and Medicaid Services includes, in addition to the face-to-face time of a patient visit (documented in the note above) non-face-to-face time: obtaining and reviewing outside history, ordering and reviewing medications, tests or procedures, care coordination (communications with other health care professionals or caregivers) and documentation in the medical record.

## 2023-06-20 NOTE — Assessment & Plan Note (Signed)
 08/04/20: Right mastectomy (Dr. 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 Neoadjuvant chemotherapy with [facility] Perjeta  6 cycles followed by Kadcyla  maintenance discontinued 11/11/2020 because of peripheral neuropathy 2. Followed by mastectomy with sentinel lymph node study 08/04/20 3. Followed by adjuvant radiation therapy 09/18/2020- 11/04/2020 4.  Decided not to do antiestrogen therapy because her ER was 0% on final path. 5.  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  Current treatment: Enhertu  ----------------------------------------------------------------------------------------------------------------- Right mastectomy area skin changes: Initially treated for shingles but since lesions did not improve skin biopsy was obtained on 07/14/2021: Infiltrating carcinoma consistent with breast origin CK7 and GA TA 3 positive, ER 0%, PR 0%, HER2 2+ by IHC, FISH negative ratio 1.56, copy #2.1   CT CAP 08/03/2021: Solid 0.8 cm right lower lobe lung nodule. Bone scan 08/03/2021: No definite evidence of metastatic disease  CT chest on July 21, 2022 demonstrating no evidence of metastatic disease progression . CT CAP 11/11/2022: Small groundglass nodules in the lungs unchanged hepatic steatosis.  No evidence of metastatic disease. Echocardiogram 08/19/2022: EF 60 to 65%    Current treatment: Enhertu  (adjusted to every 5 weeks from 11/11/2022) Enhertu  toxicities:  1. Fatigue that lasted for 3-5 days 2. Nausea: Much improved 3. Insomnia 4. Neuropathy: stable most prominent of the lateral aspects of her ring finger and index fingers 5. Hair thinning 6. Diabetes: On Ozembic   Echocardiogram January 2024: EF 70%.   CT CAP 05/02/2023: Occasional small pulmonary nodules unchanged most likely benign, no lymphadenopathy, hepatic steatosis,  prominent left uterine and adnexal varices.  No metastatic disease   Assessment and Plan Assessment & Plan Breast cancer with ongoing treatment Undergoing Enhertu  with favorable imaging in May. Next imaging in November unless symptoms arise. Fatigue and appetite loss post-treatment. Port functional. Stable blood counts, slight thrombocytopenia. - Continue Enhertu  treatment. - Schedule imaging in November unless new symptoms develop.  Aortic atherosclerosis Aortic atherosclerosis with coronary calcifications. No statin initiated by primary care. Enhertu  may affect cardiac risk. Cardiologist consultation recommended. ASCVD 10 year risk 31.9% - Refer to cardio-oncology for evaluation and management. - Coordinate with cardiologist for cardiac medication.  Type 2 diabetes mellitus Managed with Ozempic , increased to 1.0 mg. Reports weight gain despite increased dosage and activity. Advised to continue regimen and monitor response. - Monitor weight and response. - Consider alternative medication if no improvement after 2-4 more injections.   RTC in July as scheduled for labs, f/u with Dr. Gudena, and her next treatment.

## 2023-07-06 ENCOUNTER — Encounter (HOSPITAL_COMMUNITY): Admitting: Cardiology

## 2023-07-10 ENCOUNTER — Other Ambulatory Visit (HOSPITAL_BASED_OUTPATIENT_CLINIC_OR_DEPARTMENT_OTHER): Payer: Self-pay

## 2023-07-14 ENCOUNTER — Ambulatory Visit (HOSPITAL_COMMUNITY)
Admission: RE | Admit: 2023-07-14 | Discharge: 2023-07-14 | Disposition: A | Discharge status: Home / Self Care | Source: Ambulatory Visit | Attending: Adult Health | Admitting: Adult Health

## 2023-07-14 DIAGNOSIS — Z0189 Encounter for other specified special examinations: Secondary | ICD-10-CM

## 2023-07-14 DIAGNOSIS — C50411 Malignant neoplasm of upper-outer quadrant of right female breast: Secondary | ICD-10-CM | POA: Insufficient documentation

## 2023-07-14 DIAGNOSIS — Z08 Encounter for follow-up examination after completed treatment for malignant neoplasm: Secondary | ICD-10-CM | POA: Insufficient documentation

## 2023-07-14 DIAGNOSIS — Z170 Estrogen receptor positive status [ER+]: Secondary | ICD-10-CM | POA: Insufficient documentation

## 2023-07-14 DIAGNOSIS — E119 Type 2 diabetes mellitus without complications: Secondary | ICD-10-CM | POA: Insufficient documentation

## 2023-07-14 DIAGNOSIS — Z09 Encounter for follow-up examination after completed treatment for conditions other than malignant neoplasm: Secondary | ICD-10-CM

## 2023-07-14 DIAGNOSIS — I10 Essential (primary) hypertension: Secondary | ICD-10-CM | POA: Diagnosis not present

## 2023-07-14 DIAGNOSIS — I119 Hypertensive heart disease without heart failure: Secondary | ICD-10-CM | POA: Diagnosis not present

## 2023-07-14 DIAGNOSIS — I358 Other nonrheumatic aortic valve disorders: Secondary | ICD-10-CM | POA: Insufficient documentation

## 2023-07-14 LAB — ECHOCARDIOGRAM COMPLETE
Area-P 1/2: 5.46 cm2
Calc EF: 70.4 %
S' Lateral: 2.2 cm
Single Plane A2C EF: 67.8 %
Single Plane A4C EF: 72.5 %

## 2023-07-15 ENCOUNTER — Other Ambulatory Visit (HOSPITAL_BASED_OUTPATIENT_CLINIC_OR_DEPARTMENT_OTHER): Payer: Self-pay

## 2023-07-17 ENCOUNTER — Ambulatory Visit: Payer: Medicare Other | Attending: Adult Health

## 2023-07-17 VITALS — Wt 187.4 lb

## 2023-07-17 DIAGNOSIS — Z483 Aftercare following surgery for neoplasm: Secondary | ICD-10-CM | POA: Insufficient documentation

## 2023-07-17 NOTE — Therapy (Signed)
 OUTPATIENT PHYSICAL THERAPY SOZO SCREENING NOTE   Patient Name: Sabrina Woods MRN: [id number] DOB:[birth year], 68 y.o., female Today's Date: 07/17/2023  PCP: Knute Thersia Bitters, FNP REFERRING PROVIDER: Crawford Jacobsen Cornett*   PT End of Session - 07/17/23 1614     Visit Number 13   # unchanged due to screen only   PT Start Time 1612    PT Stop Time 1616    PT Time Calculation (min) 4 min    Activity Tolerance Patient tolerated treatment well    Behavior During Therapy [facility] for tasks assessed/performed          Past Medical History:  Diagnosis Date   Acid reflux    Anxiety    Breast cancer (HCC)    Breast cancer (HCC) 2022   Depression    HISTORY OF   Fibroid    PONV (postoperative nausea and vomiting)    Smoker    Past Surgical History:  Procedure Laterality Date   CESAREAN SECTION     X 2   MASTECTOMY     MASTECTOMY WITH AXILLARY LYMPH NODE DISSECTION Right 08/04/2020   Procedure: RIGHT MASTECTOMY WITH RIGHT AXILLARY LYMPH NODE DISSECTION;  Surgeon: Ebbie Cough, MD;  Location: [facility] OR;  Service: General;  Laterality: Right;   PORTACATH PLACEMENT Left 03/17/2020   Procedure: INSERTION PORT-A-CATH;  Surgeon: Ebbie Cough, MD;  Location: [facility];  Service: General;  Laterality: Left;   PORTACATH PLACEMENT Left 08/05/2021   Procedure: PORT PLACEMENT WITH ULTRASOUND GUIDANCE;  Surgeon: Ebbie Cough, MD;  Location: [facility];  Service: General;  Laterality: Left;  LMA   Patient Active Problem List   Diagnosis Date Noted   Aortic atherosclerosis (HCC) 06/20/2023   Encounter to establish care with new doctor 07/14/2022   Screening for colon cancer 07/14/2022   Encounter for smoking cessation counseling 07/14/2022   Essential (primary) hypertension 01/19/2022   Type 2 diabetes mellitus without complication, without long-term current use of insulin (HCC) 01/19/2022   Fatigue 04/08/2021   S/P mastectomy, right  08/04/2020   Chemotherapy-induced peripheral neuropathy (HCC) 05/20/2020   Malignant neoplasm of upper-outer quadrant of right breast in female, estrogen receptor positive (HCC) 03/10/2020   Fibroid    Smoker    Depression     REFERRING DIAG: right breast cancer at risk for lymphedema  THERAPY DIAG:  Aftercare following surgery for neoplasm  PERTINENT HISTORY: R breast cancer s/p R mastectomy on 08/04/20 with 2 R positive axillary nodes, high grade DCIS. Has completed chemo as of Oct 2022 and radiation 11/04/20  PRECAUTIONS: right UE Lymphedema risk, None  SUBJECTIVE: Pt returns for her 6 month L-Dex screen.   PAIN:  Are you having pain? No  SOZO SCREENING: Patient was assessed today using the SOZO machine to determine the lymphedema index score. This was compared to her baseline score. It was determined that she is within the recommended range when compared to her baseline and no further action is needed at this time. She will continue SOZO screenings. These are done every 3 months for 2 years post operatively followed by every 6 months for 2 years, and then annually.   L-DEX FLOWSHEETS - 07/17/23 1600       L-DEX LYMPHEDEMA SCREENING   Measurement Type Unilateral    L-DEX MEASUREMENT EXTREMITY Upper Extremity    POSITION  Standing    DOMINANT SIDE Right    At Risk Side Right    BASELINE SCORE (UNILATERAL) -3.5  L-DEX SCORE (UNILATERAL) 0.9    VALUE CHANGE (UNILAT) 4.4            Aden Berwyn Caldron, PTA 07/17/2023, 4:15 PM

## 2023-07-18 ENCOUNTER — Other Ambulatory Visit: Payer: Self-pay

## 2023-07-18 ENCOUNTER — Ambulatory Visit: Payer: Medicare Other

## 2023-07-18 ENCOUNTER — Other Ambulatory Visit: Payer: Medicare Other

## 2023-07-18 ENCOUNTER — Ambulatory Visit: Payer: Medicare Other | Admitting: Hematology and Oncology

## 2023-07-31 MED FILL — Fosaprepitant Dimeglumine For IV Infusion 150 MG (Base Eq): INTRAVENOUS | Qty: 5 | Status: AC

## 2023-08-01 ENCOUNTER — Inpatient Hospital Stay

## 2023-08-01 ENCOUNTER — Inpatient Hospital Stay (HOSPITAL_BASED_OUTPATIENT_CLINIC_OR_DEPARTMENT_OTHER): Admitting: Hematology and Oncology

## 2023-08-01 ENCOUNTER — Inpatient Hospital Stay: Attending: Hematology and Oncology

## 2023-08-01 VITALS — BP 161/77 | HR 84 | Temp 98.0°F | Resp 18 | Ht 66.0 in | Wt 187.7 lb

## 2023-08-01 DIAGNOSIS — R5383 Other fatigue: Secondary | ICD-10-CM

## 2023-08-01 DIAGNOSIS — C50411 Malignant neoplasm of upper-outer quadrant of right female breast: Secondary | ICD-10-CM | POA: Insufficient documentation

## 2023-08-01 DIAGNOSIS — E1140 Type 2 diabetes mellitus with diabetic neuropathy, unspecified: Secondary | ICD-10-CM

## 2023-08-01 DIAGNOSIS — Z5112 Encounter for antineoplastic immunotherapy: Secondary | ICD-10-CM | POA: Diagnosis present

## 2023-08-01 DIAGNOSIS — C773 Secondary and unspecified malignant neoplasm of axilla and upper limb lymph nodes: Secondary | ICD-10-CM | POA: Diagnosis not present

## 2023-08-01 DIAGNOSIS — Z170 Estrogen receptor positive status [ER+]: Secondary | ICD-10-CM | POA: Diagnosis not present

## 2023-08-01 DIAGNOSIS — Z0000 Encounter for general adult medical examination without abnormal findings: Secondary | ICD-10-CM

## 2023-08-01 DIAGNOSIS — E1142 Type 2 diabetes mellitus with diabetic polyneuropathy: Secondary | ICD-10-CM | POA: Diagnosis not present

## 2023-08-01 LAB — CMP (CANCER CENTER ONLY)
ALT: 20 U/L (ref 0–44)
AST: 23 U/L (ref 15–41)
Albumin: 3.9 g/dL (ref 3.5–5.0)
Alkaline Phosphatase: 117 U/L (ref 38–126)
Anion gap: 6 (ref 5–15)
BUN: 8 mg/dL (ref 8–23)
CO2: 26 mmol/L (ref 22–32)
Calcium: 9.3 mg/dL (ref 8.9–10.3)
Chloride: 106 mmol/L (ref 98–111)
Creatinine: 0.68 mg/dL (ref 0.44–1.00)
GFR, Estimated: >60 mL/min (ref >60)
Glucose, Bld: 135 mg/dL — ABNORMAL HIGH (ref 70–99)
Potassium: 3.9 mmol/L (ref 3.5–5.1)
Sodium: 138 mmol/L (ref 135–145)
Total Bilirubin: 0.5 mg/dL (ref 0.0–1.2)
Total Protein: 7 g/dL (ref 6.5–8.1)

## 2023-08-01 LAB — CBC WITH DIFFERENTIAL (CANCER CENTER ONLY)
Abs Immature Granulocytes: 0.01 K/uL (ref 0.00–0.07)
Basophils Absolute: 0.1 K/uL (ref 0.0–0.1)
Basophils Relative: 1 %
Eosinophils Absolute: 0.1 K/uL (ref 0.0–0.5)
Eosinophils Relative: 2 %
HCT: 38.9 % (ref 36.0–46.0)
Hemoglobin: 13.5 g/dL (ref 12.0–15.0)
Immature Granulocytes: 0 %
Lymphocytes Relative: 18 %
Lymphs Abs: 1.3 K/uL (ref 0.7–4.0)
MCH: 31.5 pg (ref 26.0–34.0)
MCHC: 34.7 g/dL (ref 30.0–36.0)
MCV: 90.9 fL (ref 80.0–100.0)
Monocytes Absolute: 0.5 K/uL (ref 0.1–1.0)
Monocytes Relative: 8 %
Neutro Abs: 5 K/uL (ref 1.7–7.7)
Neutrophils Relative %: 71 %
Platelet Count: 153 K/uL (ref 150–400)
RBC: 4.28 MIL/uL (ref 3.87–5.11)
RDW: 13.1 % (ref 11.5–15.5)
WBC Count: 7 K/uL (ref 4.0–10.5)
nRBC: 0 % (ref 0.0–0.2)

## 2023-08-01 LAB — HEMOGLOBIN A1C
Hgb A1c MFr Bld: 6.3 % — ABNORMAL HIGH (ref 4.8–5.6)
Mean Plasma Glucose: 134.11 mg/dL

## 2023-08-01 MED ORDER — SODIUM CHLORIDE 0.9% FLUSH
10.0000 mL | Freq: Once | INTRAVENOUS | Status: AC
  Administered 2023-08-01: 10 mL

## 2023-08-01 MED ORDER — SODIUM CHLORIDE 0.9% FLUSH
10.0000 mL | INTRAVENOUS | Status: DC | PRN
  Administered 2023-08-01: 10 mL

## 2023-08-01 MED ORDER — DEXTROSE 5 % IV SOLN: Freq: Once | INTRAVENOUS | Status: AC

## 2023-08-01 MED ORDER — FAM-TRASTUZUMAB DERUXTECAN-NXKI CHEMO 100 MG IV SOLR
3.2000 mg/kg | Freq: Once | INTRAVENOUS | Status: AC
  Administered 2023-08-01: 300 mg via INTRAVENOUS
  Filled 2023-08-01: qty 15

## 2023-08-01 MED ORDER — FOSAPREPITANT DIMEGLUMINE INJECTION 150 MG
150.0000 mg | Freq: Once | INTRAVENOUS | Status: AC
  Administered 2023-08-01: 150 mg via INTRAVENOUS
  Filled 2023-08-01: qty 150

## 2023-08-01 MED ORDER — DEXAMETHASONE SODIUM PHOSPHATE 10 MG/ML IJ SOLN
10.0000 mg | Freq: Once | INTRAMUSCULAR | Status: AC
  Administered 2023-08-01: 10 mg via INTRAVENOUS
  Filled 2023-08-01: qty 1

## 2023-08-01 MED ORDER — ALPRAZOLAM 0.25 MG PO TABS: 0.2500 mg | ORAL_TABLET | Freq: Two times a day (BID) | ORAL | 3 refills | Status: AC | PRN

## 2023-08-01 MED ORDER — HEPARIN SOD (PORK) LOCK FLUSH 100 UNIT/ML IV SOLN
500.0000 [IU] | Freq: Once | INTRAVENOUS | Status: AC | PRN
  Administered 2023-08-01: 500 [IU]

## 2023-08-01 MED ORDER — PROCHLORPERAZINE MALEATE 10 MG PO TABS: 10.0000 mg | ORAL_TABLET | Freq: Four times a day (QID) | ORAL | 3 refills | Status: AC | PRN

## 2023-08-01 MED ORDER — ACETAMINOPHEN 325 MG PO TABS
650.0000 mg | ORAL_TABLET | Freq: Once | ORAL | Status: AC
  Administered 2023-08-01: 650 mg via ORAL
  Filled 2023-08-01: qty 2

## 2023-08-01 MED ORDER — GABAPENTIN 600 MG PO TABS: 600.0000 mg | ORAL_TABLET | Freq: Three times a day (TID) | ORAL | 3 refills | Status: AC

## 2023-08-01 MED ORDER — DIPHENHYDRAMINE HCL 25 MG PO CAPS
25.0000 mg | ORAL_CAPSULE | Freq: Once | ORAL | Status: AC
  Administered 2023-08-01: 25 mg via ORAL
  Filled 2023-08-01: qty 1

## 2023-08-01 MED ORDER — PALONOSETRON HCL INJECTION 0.25 MG/5ML
0.2500 mg | Freq: Once | INTRAVENOUS | Status: AC
  Administered 2023-08-01: 0.25 mg via INTRAVENOUS
  Filled 2023-08-01: qty 5

## 2023-08-01 NOTE — Progress Notes (Signed)
 Patient Care Team: Knute Thersia Bitters, FNP as PCP - General (Family Medicine) Ebbie Cough, MD as Consulting Physician (General Surgery) Dewey Rush, MD as Consulting Physician (Radiation Oncology) Odean Potts, MD as Consulting Physician (Hematology and Oncology)  DIAGNOSIS:  Encounter Diagnoses  Name Primary?   Malignant neoplasm of upper-outer quadrant of right breast in female, estrogen receptor positive (HCC) Yes   Healthcare maintenance    Type 2 diabetes mellitus with diabetic neuropathy, without long-term current use of insulin (HCC)     SUMMARY OF ONCOLOGIC HISTORY: Oncology History  Malignant neoplasm of upper-outer quadrant of right breast in female, estrogen receptor positive (HCC)  03/10/2020 Initial Diagnosis   Palpable right breast mass x6 months.  Mammogram revealed left breast cysts (intraductal papilloma), 11 cm right breast mass and 2 masses 2.6 cm each in the right axilla: Biopsy grade 3 IDC ER 5% weak, PR 0%, Ki-67 25%, HER-2 3+ IHC positive   03/10/2020 Cancer Staging   Staging form: Breast, AJCC 8th Edition - Clinical stage from 03/10/2020: Stage IIIA (cT3, cN1, cM0, G3, ER+, PR-, HER2+) - Signed by Odean Potts, MD on 03/10/2020 Stage prefix: Initial diagnosis   03/19/2020 - 07/01/2020 Chemotherapy   [facility] Perjeta    08/04/2020 Surgery   Right mastectomy (Dr. 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, high-grade DCIS with necrosis and calcifications, and 2 right axillary lymph nodes positive for metastatic carcinoma   08/04/2020 Cancer Staging   Staging form: Breast, AJCC 8th Edition - Pathologic stage from 08/04/2020: No Stage Recommended (ypTis (DCIS), pN2, cM0) - Signed by Crawford Morna Pickle, NP on 02/05/2021 Stage prefix: Post-therapy   08/21/2020 - 10/23/2020 Chemotherapy   Discontinued due to peripheral neuropathy Patient is on Treatment Plan : BREAST ADO-Trastuzumab Emtansine  (Kadcyla ) q21d       08/2020 -  Anti-estrogen oral therapy    Decided not to do antiestrogen therapy because her ER was 0% on final path.   10/06/2020 - 11/04/2020 Radiation Therapy   Adjuvant radiation    Relapse/Recurrence   Right mastectomy area skin changes: Initially treated for shingles but since lesions did not improve skin biopsy was obtained on 07/14/2021: Infiltrating carcinoma consistent with breast origin CK7 and GA TA 3 positive, ER 0%, PR 0%, HER2 2+ by IHC, FISH negative ratio 1.56, copy #2.1   07/26/2021 -  Chemotherapy   Patient is on Treatment Plan : BREAST METASTATIC Fam-Trastuzumab Deruxtecan-nxki  (Enhertu ) (5.4) q21d     01/14/2022 Imaging   CT Chest: Slight decrease in size of the right lower lobe pulmonary nodule. Stable tiny non solid nodule in the superior segment of left lower lobe. No new pulmonary nodules. Stable surgical changes from right mastectomy and axillary lymph node dissection. No new developing lymph node enlargement or other mass lesion.  Chest port. Fatty liver infiltration. Aortic Atherosclerosis (ICD10-I70.0).     CHIEF COMPLIANT: Enhertu  follow-up  HISTORY OF PRESENT ILLNESS:   History of Present Illness Sabrina Woods is a 68 year old female who presents for treatment with Enhertu .  She is complaining of heart palpitations and curling up of the toes possibly due to magnesium deficiency.  Heart palpitations occur primarily when bending down and standing up, and are exacerbated after chemotherapy sessions. She has not consulted a cardiologist and missed a recent appointment due to work conflicts. An echocardiogram has been performed previously.  Muscle stiffness and cramps are present, and she is not taking magnesium supplements.     ALLERGIES:  has no known allergies.  MEDICATIONS:  Current Outpatient Medications  Medication Sig Dispense Refill   ALPRAZolam  (XANAX ) 0.25 MG tablet Take 1 tablet (0.25 mg total) by mouth 2 (two) times daily as needed for  anxiety. 60 tablet 3   esomeprazole (NEXIUM) 20 MG capsule Take 20 mg by mouth daily.     gabapentin  (NEURONTIN ) 600 MG tablet Take 1 tablet (600 mg total) by mouth 3 (three) times daily. 270 tablet 3   lidocaine -prilocaine  (EMLA ) cream Apply topically once.     ondansetron  (ZOFRAN ) 8 MG tablet Take 1 tablet (8 mg total) by mouth every 8 (eight) hours as needed for nausea. Take the 3rd day after chemo 30 tablet 3   OVER THE COUNTER MEDICATION Stool softener     prochlorperazine  (COMPAZINE ) 10 MG tablet Take 1 tablet (10 mg total) by mouth every 6 (six) hours as needed for nausea or vomiting. 30 tablet 3   Semaglutide , 1 MG/DOSE, 4 MG/3ML SOPN Inject 1 mg as directed once a week. 6 mL 3   No current facility-administered medications for this visit.    PHYSICAL EXAMINATION: ECOG PERFORMANCE STATUS: 1 - Symptomatic but completely ambulatory  Vitals:   08/01/23 0930  BP: (!) 161/77  Pulse: 84  Resp: 18  Temp: 98 F (36.7 C)  SpO2: 100%   Filed Weights   08/01/23 0930  Weight: 187 lb 11.2 oz (85.1 kg)      LABORATORY DATA:  I have reviewed the data as listed    Latest Ref Rng & Units 08/01/2023    9:12 AM 06/20/2023    8:49 AM 05/09/2023    8:36 AM  CMP  Glucose 70 - 99 mg/dL 864  854  818   BUN 8 - 23 mg/dL 8  9  12    Creatinine 0.44 - 1.00 mg/dL 9.31  9.35  9.36   Sodium 135 - 145 mmol/L 138  137  137   Potassium 3.5 - 5.1 mmol/L 3.9  3.9  3.9   Chloride 98 - 111 mmol/L 106  105  105   CO2 22 - 32 mmol/L 26  25  26    Calcium  8.9 - 10.3 mg/dL 9.3  9.4  9.8   Total Protein 6.5 - 8.1 g/dL 7.0  7.0  7.2   Total Bilirubin 0.0 - 1.2 mg/dL 0.5  0.5  0.5   Alkaline Phos 38 - 126 U/L 117  113  126   AST 15 - 41 U/L 23  25  28    ALT 0 - 44 U/L 20  23  27      Lab Results  Component Value Date   WBC 7.0 08/01/2023   HGB 13.5 08/01/2023   HCT 38.9 08/01/2023   MCV 90.9 08/01/2023   PLT 153 08/01/2023   NEUTROABS 5.0 08/01/2023    ASSESSMENT & PLAN:  Malignant neoplasm of  upper-outer quadrant of right breast in female, estrogen receptor positive (HCC) 08/04/20: Right mastectomy (Dr. 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 Neoadjuvant chemotherapy with [facility] Perjeta  6 cycles followed by Kadcyla  maintenance discontinued 11/11/2020 because of peripheral neuropathy 2. Followed by mastectomy with sentinel lymph node study 08/04/20 3. Followed by adjuvant radiation therapy 09/18/2020- 11/04/2020 4.  Decided not to do antiestrogen therapy because her ER was 0% on final path. 5.  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  Current treatment: Enhertu  ----------------------------------------------------------------------------------------------------------------- Right mastectomy area skin changes: Initially treated for shingles but since lesions did not improve skin biopsy was obtained on 07/14/2021: Infiltrating carcinoma consistent with breast origin CK7 and GA TA 3 positive, ER 0%, PR 0%, HER2 2+ by IHC, FISH negative ratio 1.56, copy #2.1   CT CAP 08/03/2021: Solid 0.8 cm right lower lobe lung nodule. Bone scan 08/03/2021: No definite evidence of metastatic disease  CT chest on July 21, 2022 demonstrating no evidence of metastatic disease progression . CT CAP 11/11/2022: Small groundglass nodules in the lungs unchanged hepatic steatosis.  No evidence of metastatic disease. Echocardiogram 08/19/2022: EF 60 to 65%    Current treatment: Enhertu  (adjusted to every 5 weeks from 11/11/2022), cycle 29 Enhertu  toxicities:  1. Fatigue that lasted for 3-5 days 2. Nausea: Much improved 3. Insomnia 4. Neuropathy: stable most prominent of the lateral aspects of her ring finger and index fingers 5. Hair thinning 6. Diabetes: On Ozempic , we will order hemoglobin A1c today.  She has an appointment with her primary  care physician coming up. 7.  Palpitations: Patient will call and make an appointment with cardiology.  Recommended OTC magnesium 4 Kirlin discomfort in her toes echocardiogram January 2024: EF 70%.   CT CAP 05/02/2023: Occasional small pulmonary nodules unchanged most likely benign, no lymphadenopathy, hepatic steatosis, prominent left uterine and adnexal varices.  No metastatic disease      Orders Placed This Encounter  Procedures   Hemoglobin A1c    Standing Status:   Future    Number of Occurrences:   1    Expiration Date:   07/31/2024   CBC with Differential (Cancer Center Only)    Standing Status:   Future    Expected Date:   10/24/2023    Expiration Date:   10/23/2024   CMP (Cancer Center only)    Standing Status:   Future    Expected Date:   10/24/2023    Expiration Date:   10/23/2024   CBC with Differential (Cancer Center Only)    Standing Status:   Future    Expected Date:   12/05/2023    Expiration Date:   12/04/2024   CMP (Cancer Center only)    Standing Status:   Future    Expected Date:   12/05/2023    Expiration Date:   12/04/2024   CBC with Differential (Cancer Center Only)    Standing Status:   Future    Expected Date:   01/16/2024    Expiration Date:   01/15/2025   CMP (Cancer Center only)    Standing Status:   Future    Expected Date:   01/16/2024    Expiration Date:   01/15/2025   CBC with Differential (Cancer Center Only)    Standing Status:   Future    Expected Date:   02/27/2024    Expiration Date:   02/26/2025   CMP (Cancer Center only)    Standing Status:   Future    Expected Date:   02/27/2024    Expiration Date:   02/26/2025   CBC with Differential (Cancer Center Only)    Standing Status:   Future    Expected Date:   04/09/2024    Expiration Date:   04/09/2025   CMP (Cancer Center only)    Standing Status:   Future    Expected Date:   04/09/2024    Expiration Date:   04/09/2025   CBC with Differential (Cancer Center Only)  Standing Status:   Future     Expected Date:   05/21/2024    Expiration Date:   05/21/2025   CMP (Cancer Center only)    Standing Status:   Future    Expected Date:   05/21/2024    Expiration Date:   05/21/2025   CBC with Differential (Cancer Center Only)    Standing Status:   Future    Expected Date:   07/02/2024    Expiration Date:   07/02/2025   CMP (Cancer Center only)    Standing Status:   Future    Expected Date:   07/02/2024    Expiration Date:   07/02/2025   CBC with Differential (Cancer Center Only)    Standing Status:   Future    Expected Date:   08/13/2024    Expiration Date:   08/13/2025   CMP (Cancer Center only)    Standing Status:   Future    Expected Date:   08/13/2024    Expiration Date:   08/13/2025   The patient has a good understanding of the overall plan. she agrees with it. she will call with any problems that may develop before the next visit here. Total time spent: 30 mins including face to face time and time spent for planning, charting and co-ordination of care   Sabrina MARLA Chad, MD 08/01/23

## 2023-08-01 NOTE — Assessment & Plan Note (Signed)
 08/04/20: Right mastectomy (Dr. 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 Neoadjuvant chemotherapy with [facility] Perjeta  6 cycles followed by Kadcyla  maintenance discontinued 11/11/2020 because of peripheral neuropathy 2. Followed by mastectomy with sentinel lymph node study 08/04/20 3. Followed by adjuvant radiation therapy 09/18/2020- 11/04/2020 4.  Decided not to do antiestrogen therapy because her ER was 0% on final path. 5.  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  Current treatment: Enhertu  ----------------------------------------------------------------------------------------------------------------- Right mastectomy area skin changes: Initially treated for shingles but since lesions did not improve skin biopsy was obtained on 07/14/2021: Infiltrating carcinoma consistent with breast origin CK7 and GA TA 3 positive, ER 0%, PR 0%, HER2 2+ by IHC, FISH negative ratio 1.56, copy #2.1   CT CAP 08/03/2021: Solid 0.8 cm right lower lobe lung nodule. Bone scan 08/03/2021: No definite evidence of metastatic disease  CT chest on July 21, 2022 demonstrating no evidence of metastatic disease progression . CT CAP 11/11/2022: Small groundglass nodules in the lungs unchanged hepatic steatosis.  No evidence of metastatic disease. Echocardiogram 08/19/2022: EF 60 to 65%    Current treatment: Enhertu  (adjusted to every 5 weeks from 11/11/2022), cycle 29 Enhertu  toxicities:  1. Fatigue that lasted for 3-5 days 2. Nausea: Much improved 3. Insomnia 4. Neuropathy: stable most prominent of the lateral aspects of her ring finger and index fingers 5. Hair thinning 6. Diabetes: On on Ozempic    Echocardiogram January 2024: EF 70%.   CT CAP 05/02/2023: Occasional small pulmonary nodules unchanged most likely benign, no lymphadenopathy, hepatic  steatosis, prominent left uterine and adnexal varices.  No metastatic disease

## 2023-08-01 NOTE — Patient Instructions (Signed)

## 2023-08-02 ENCOUNTER — Ambulatory Visit (HOSPITAL_BASED_OUTPATIENT_CLINIC_OR_DEPARTMENT_OTHER): Payer: Medicare Other | Admitting: Family Medicine

## 2023-08-08 ENCOUNTER — Ambulatory Visit (HOSPITAL_COMMUNITY)
Admission: RE | Admit: 2023-08-08 | Discharge: 2023-08-08 | Disposition: A | Discharge status: Home / Self Care | Source: Ambulatory Visit | Attending: Cardiology | Admitting: Cardiology

## 2023-08-08 ENCOUNTER — Encounter (HOSPITAL_COMMUNITY): Payer: Self-pay | Admitting: Cardiology

## 2023-08-08 ENCOUNTER — Inpatient Hospital Stay (HOSPITAL_COMMUNITY)
Admission: RE | Admit: 2023-08-08 | Discharge: 2023-08-08 | Disposition: A | Discharge status: Home / Self Care | Source: Ambulatory Visit | Attending: Cardiology | Admitting: Cardiology

## 2023-08-08 ENCOUNTER — Other Ambulatory Visit (HOSPITAL_COMMUNITY): Payer: Self-pay | Admitting: Cardiology

## 2023-08-08 VITALS — BP 152/82 | HR 93 | Ht 66.0 in | Wt 185.2 lb

## 2023-08-08 DIAGNOSIS — I427 Cardiomyopathy due to drug and external agent: Secondary | ICD-10-CM

## 2023-08-08 DIAGNOSIS — R002 Palpitations: Secondary | ICD-10-CM | POA: Insufficient documentation

## 2023-08-08 DIAGNOSIS — I10 Essential (primary) hypertension: Secondary | ICD-10-CM | POA: Insufficient documentation

## 2023-08-08 DIAGNOSIS — Z79899 Other long term (current) drug therapy: Secondary | ICD-10-CM | POA: Insufficient documentation

## 2023-08-08 DIAGNOSIS — Z170 Estrogen receptor positive status [ER+]: Secondary | ICD-10-CM | POA: Diagnosis not present

## 2023-08-08 DIAGNOSIS — Z9011 Acquired absence of right breast and nipple: Secondary | ICD-10-CM | POA: Insufficient documentation

## 2023-08-08 DIAGNOSIS — G629 Polyneuropathy, unspecified: Secondary | ICD-10-CM | POA: Diagnosis not present

## 2023-08-08 DIAGNOSIS — Z853 Personal history of malignant neoplasm of breast: Secondary | ICD-10-CM | POA: Diagnosis not present

## 2023-08-08 DIAGNOSIS — I493 Ventricular premature depolarization: Secondary | ICD-10-CM

## 2023-08-08 DIAGNOSIS — Z09 Encounter for follow-up examination after completed treatment for conditions other than malignant neoplasm: Secondary | ICD-10-CM

## 2023-08-08 DIAGNOSIS — C50411 Malignant neoplasm of upper-outer quadrant of right female breast: Secondary | ICD-10-CM

## 2023-08-08 DIAGNOSIS — Z1722 Progesterone receptor negative status: Secondary | ICD-10-CM | POA: Diagnosis not present

## 2023-08-08 MED ORDER — AMLODIPINE BESYLATE 5 MG PO TABS: 5.0000 mg | ORAL_TABLET | Freq: Every day | ORAL | 3 refills | Status: AC

## 2023-08-08 MED ORDER — CARVEDILOL 3.125 MG PO TABS: 3.1250 mg | ORAL_TABLET | Freq: Two times a day (BID) | ORAL | 3 refills | Status: AC

## 2023-08-08 NOTE — Patient Instructions (Signed)
 START Carvedilol  3.125 mg Twice daily  START Amlodipine  5 mg daily.  Your provider has recommended that  you wear a Zio Patch for 14 days.  This monitor will record your heart rhythm for our review.  IF you have any symptoms while wearing the monitor please press the button.  If you have any issues with the patch or you notice a red or orange light on it please call the company at [phone].  Once you remove the patch please mail it back to the company as soon as possible so we can get the results.  Your physician recommends that you schedule a follow-up appointment in: 3 months ( November) ** PLEASE CALL THE OFFICE IN SEPTEMBER TO ARRANGE YOUR FOLLOW UP APPOINTMENT.**  If you have any questions or concerns before your next appointment please send us  a message through [place] or call our office at [phone].    TO LEAVE A MESSAGE FOR THE NURSE SELECT OPTION 2, PLEASE LEAVE A MESSAGE INCLUDING: YOUR NAME DATE OF BIRTH CALL BACK NUMBER REASON FOR CALL**this is important as we prioritize the call backs  YOU WILL RECEIVE A CALL BACK THE SAME DAY AS LONG AS YOU CALL BEFORE 4:00 PM  At the [facility], you and your health needs are our priority. As part of our continuing mission to provide you with exceptional heart care, we have created designated Provider Care Teams. These Care Teams include your primary Cardiologist (physician) and Advanced Practice Providers (APPs- Physician Assistants and Nurse Practitioners) who all work together to provide you with the care you need, when you need it.   You may see any of the following providers on your designated Care Team at your next follow up: Dr Toribio Fuel Dr Ezra Shuck Dr. Ria Commander Dr. Morene Brownie Amy Lenetta, NP Caffie Shed, [place] [facility] [place], [place] Beckey Coe, NP [place] Lee, NP Ellouise Class, NP Tinnie Redman, PharmD Jaun Bash, PharmD   Please be sure to bring in all your  medications bottles to every appointment.    Thank you for choosing [facility]

## 2023-08-08 NOTE — Progress Notes (Signed)
 Cardio-Oncology Clinic Consult Note   Referring Physician: Dr. Odean Primary Care: Primary Cardiologist:  HPI:  Sabrina Woods is a 68 y.o. female with past medical history of f R breast cancer Stage IIIA ER+ HER2+ diagnosed 03/2020, started pertuzumab  03/2020-10/2020 stopped 2/2 peripheral neuropathy, started trastuzumab  07/2021 -, R mastectomy 08/2020, R XRT therapy fall 2022  who has been referred by Dr. Odean to establish in the cardio-oncology clinic for monitoring of cardio-toxicity while undergoing chemotherapy.     {Anything typed between these two boxes will persist and can be pulled forward to future notes. This phrase will delete itself when the note is signed :1}   Oncology History  Malignant neoplasm of upper-outer quadrant of right breast in female, estrogen receptor positive (HCC)  03/10/2020 Initial Diagnosis   Palpable right breast mass x6 months.  Mammogram revealed left breast cysts (intraductal papilloma), 11 cm right breast mass and 2 masses 2.6 cm each in the right axilla: Biopsy grade 3 IDC ER 5% weak, PR 0%, Ki-67 25%, HER-2 3+ IHC positive   03/10/2020 Cancer Staging   Staging form: Breast, AJCC 8th Edition - Clinical stage from 03/10/2020: Stage IIIA (cT3, cN1, cM0, G3, ER+, PR-, HER2+) - Signed by Odean Potts, MD on 03/10/2020 Stage prefix: Initial diagnosis   03/19/2020 - 07/01/2020 Chemotherapy   [facility] Per[facility]eta    08/04/2020 Surgery   Right mastectomy (Dr. 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, high-grade DCIS with necrosis and calcifications, and 2 right axillary lymph nodes positive for metastatic carcinoma   08/04/2020 Cancer Staging   Staging form: Breast, AJCC 8th Edition - Pathologic stage from 08/04/2020: No Stage Recommended (ypTis (DCIS), pN2, cM0) - Signed by Crawford Morna Pickle, NP on 02/05/2021 Stage prefix: Post-therapy   08/21/2020 - 10/23/2020 Chemotherapy   Discontinued due to peripheral  neuropathy Patient is on Treatment Plan : BREAST ADO-Trastuzumab Emtansine  (Kadcyla ) q21d      08/2020 -  Anti-estrogen oral therapy    Decided not to do antiestrogen therapy because her ER was 0% on final path.   10/06/2020 - 11/04/2020 Radiation Therapy   Adjuvant radiation    Relapse/Recurrence   Right mastectomy area skin changes: Initially treated for shingles but since lesions did not improve skin biopsy was obtained on 07/14/2021: Infiltrating carcinoma consistent with breast origin CK7 and GA TA 3 positive, ER 0%, PR 0%, HER2 2+ by IHC, FISH negative ratio 1.56, copy #2.1   07/26/2021 -  Chemotherapy   Patient is on Treatment Plan : BREAST METASTATIC Fam-Trastuzumab Deruxtecan-nxki  (Enhertu ) (5.4) q21d     01/14/2022 Imaging   CT Chest: Slight decrease in size of the right lower lobe pulmonary nodule. Stable tiny non solid nodule in the superior segment of left lower lobe. No new pulmonary nodules. Stable surgical changes from right mastectomy and axillary lymph node dissection. No new developing lymph node enlargement or other mass lesion.  Chest port. Fatty liver infiltration. Aortic Atherosclerosis (ICD10-I70.0).     Prior Echos: 03/16/2020 TTE- EF 65-70%, GLS - 20%, normal RV function, no significant valve disease 06/29/2020 TTE-unchanged 10/08/2020 TTE EF 65-70%, GLS -24 07/22/2021 TTE EF 60-65%, GLS attempted 10/08/2021 TTE EF 60-65%, GLS -19     Overall reports that she is doing well.  She was referred back to cardio oncology clinic given issues with palpitations and lightheadedness, usually in the week following chemotherapy treatment.  She reports that the symptoms are most prominent when she bends over, they last for about a  minute and then improved.  During her most recent round of chemotherapy she did not have any cardiac symptoms, but does report fatigue, nausea, reduced appetite during that time.  Her blood pressure has been moderately elevated for some time, she has not  been previously placed on treatment.  Had been on Farxiga  in the past but had issues with yeast infections.    Medical history and current medications were reviewed in Epic as part of clinic visit.   PHYSICAL EXAM: Vitals:   08/08/23 0942  BP: (!) 152/82  Pulse: 93  SpO2: 98%   GENERAL: Well nourished and in no apparent distress at rest.  HEENT: The mucous membranes are pink and moist.   PULM:  Normal work of breathing, clear to auscultation bilaterally. Respirations are unlabored.  CARDIAC:  JVP: flat         Normal rate with regular rhythm, occasional extrasystoles heard. No murmurs, rubs or gallops.  No edema.  ABDOMEN: Soft, non-tender, non-distended. NEUROLOGIC: Patient is oriented x3 with no focal or lateralizing neurologic deficits.  PSYCH: Patients affect is appropriate, there is no evidence of anxiety or depression.  SKIN: Warm and dry; no lesions or wounds. Warm and well perfused extremities.    ASSESSMENT & PLAN:  Cardio-oncology:  Palpitations:  Hypertension:  Explained incidence of *** cardiotoxicity and role of Cardio-oncology clinic at length. Echo images reviewed personally. All parameters stable. Reviewed signs and symptoms of HF to look for. Ok to begin chemotherapy. Follow-up with echo in *** months.   Morene Brownie, MD [facility] Mechanical Circulatory Support Cardio-Oncology 08/08/23

## 2023-08-10 ENCOUNTER — Encounter (HOSPITAL_BASED_OUTPATIENT_CLINIC_OR_DEPARTMENT_OTHER): Payer: Self-pay | Admitting: *Deleted

## 2023-08-11 ENCOUNTER — Encounter (HOSPITAL_BASED_OUTPATIENT_CLINIC_OR_DEPARTMENT_OTHER): Payer: Self-pay | Admitting: Family Medicine

## 2023-08-11 ENCOUNTER — Ambulatory Visit (HOSPITAL_BASED_OUTPATIENT_CLINIC_OR_DEPARTMENT_OTHER): Admitting: Family Medicine

## 2023-08-11 VITALS — BP 138/69 | HR 78 | Temp 98.2°F | Ht 66.0 in | Wt 184.4 lb

## 2023-08-11 DIAGNOSIS — G620 Drug-induced polyneuropathy: Secondary | ICD-10-CM

## 2023-08-11 DIAGNOSIS — I10 Essential (primary) hypertension: Secondary | ICD-10-CM

## 2023-08-11 DIAGNOSIS — E119 Type 2 diabetes mellitus without complications: Secondary | ICD-10-CM | POA: Diagnosis not present

## 2023-08-11 DIAGNOSIS — I700 Atherosclerosis of aorta: Secondary | ICD-10-CM

## 2023-08-11 DIAGNOSIS — Z170 Estrogen receptor positive status [ER+]: Secondary | ICD-10-CM

## 2023-08-11 DIAGNOSIS — C50411 Malignant neoplasm of upper-outer quadrant of right female breast: Secondary | ICD-10-CM | POA: Diagnosis not present

## 2023-08-11 DIAGNOSIS — T451X5A Adverse effect of antineoplastic and immunosuppressive drugs, initial encounter: Secondary | ICD-10-CM

## 2023-08-11 LAB — POCT UA - MICROALBUMIN
Creatinine, POC: 50 mg/dL
Microalbumin Ur, POC: 10 mg/L

## 2023-08-11 MED ORDER — TIRZEPATIDE 2.5 MG/0.5ML ~~LOC~~ SOAJ: 2.5000 mg | SUBCUTANEOUS | 0 refills | Status: DC

## 2023-08-11 MED ORDER — TIRZEPATIDE 5 MG/0.5ML ~~LOC~~ SOAJ: 5.0000 mg | SUBCUTANEOUS | 2 refills | Status: DC

## 2023-08-11 NOTE — Patient Instructions (Addendum)
 Wait 2 weeks until taking your new Mounjaro  injection.    Return for fasting lipid in the next week.   Ophthalmology Offices   [facility]  [address]  [place], [place] [address] [phone]  [facility] Ophthal[facility] [address] [place], [place] [address] Phone: [phone]  [facility] [address] [place], [place] [address] Phone: [phone]  [facility]  Optometrist [address]  [phone]  [facility] Optometrist [address]  [phone]

## 2023-08-11 NOTE — Progress Notes (Signed)
 Subjective:   Sabrina Woods 01/04/1956 08/11/2023  Chief Complaint  Patient presents with   Diabetes    Patient presents today for DM follow up. She states that her oncologist would like for her to change from Ozempic  due to her not losing enough weight. Dr Odean suggest Mounjuro. She states that her cardiologist would like for her to start cholesterol medications based on ECHO. Patient just started amlodipine  on Tuesday.     HPI: Sabrina Woods presents today for re-assessment and management of chronic medical conditions.   DIABETES MELLITUS: Sabrina Woods presents for the medical management of diabetes.  Current diabetes medication regimen: Ozempic  1mg    Sabrina Woods has been tolerating the Ozempic , but reports moderate nausea and GI upset with taking.  She is interested in switching to Mounjaro  for improved weight loss benefit and A1c control.  Denies polydipsia, polyphagia, polyuria, open wounds or ulcers on feet.  Lab Results  Component Value Date   HGBA1C 6.3 (H) 08/01/2023    Foot Exam: 08/11/2023 Lab Results  Component Value Date   LABMICR 16.5 08/02/2022   MICROALBUR 10 08/11/2023    Wt Readings from Last 3 Encounters:  08/11/23 184 lb 6.4 oz (83.6 kg)  08/08/23 185 lb 3.2 oz (84 kg)  08/01/23 187 lb 11.2 oz (85.1 kg)     Chemotherapy induced cardiotoxicity Patient is established with Dr. Zenaida with cardio-oncology clinic and recently had a visit with him on August 5.  She is currently under treatment with Dr. Cadena with oncology for the malignant neoplasm of upper outer quadrant of right breast, estrogen receptor positive.  She was diagnosed in 2022 started on Petruzzi Mab 03/22/2020 to October 2022 and stopped due to peripheral neuropathy in February 2023.  She had a right mastectomy in 2022 and is currently undergoing chemotherapy.  She has undergone several echocardiograms from 2022 that show an EF of 60 to 65%, but has been having palpitations  and lightheadedness usually in the week following chemotherapy.  She reported symptoms are worsened when she bends over.  Blood pressure has been moderately elevated, but had not been previously on antihypertensive medication.  She was previously on Farxiga  for diabetes, but had issues with yeast.  With patient's most recent echo, there is no evidence of cardiotoxicity.  She was started on Coreg  3.125 mg twice daily for palpitations and recommend a repeat of her echocardiogram with upcoming cardiology visit.  She is currently wearing a Zio patch for monitoring.  Her blood pressure has been elevated with several visits and she was started on amlodipine  5 mg daily. Reports tolerating well at this time.    The following portions of the patient's history were reviewed and updated as appropriate: past medical history, past surgical history, family history, social history, allergies, medications, and problem list.   Patient Active Problem List   Diagnosis Date Noted   Aortic atherosclerosis (HCC) 06/20/2023   Encounter to establish care with new doctor 07/14/2022   Screening for colon cancer 07/14/2022   Encounter for smoking cessation counseling 07/14/2022   Essential (primary) hypertension 01/19/2022   Type 2 diabetes mellitus without complication, without long-term current use of insulin (HCC) 01/19/2022   Fatigue 04/08/2021   S/P mastectomy, right 08/04/2020   Chemotherapy-induced peripheral neuropathy (HCC) 05/20/2020   Malignant neoplasm of upper-outer quadrant of right breast in female, estrogen receptor positive (HCC) 03/10/2020   Fibroid    Smoker    Depression    Past Medical History:  Diagnosis Date   Acid reflux    Anxiety    Breast cancer (HCC)    Breast cancer (HCC) 2022   Depression    HISTORY OF   Fibroid    PONV (postoperative nausea and vomiting)    Smoker    Past Surgical History:  Procedure Laterality Date   CESAREAN SECTION     X 2   MASTECTOMY     MASTECTOMY  WITH AXILLARY LYMPH NODE DISSECTION Right 08/04/2020   Procedure: RIGHT MASTECTOMY WITH RIGHT AXILLARY LYMPH NODE DISSECTION;  Surgeon: Ebbie Cough, MD;  Location: [facility] OR;  Service: General;  Laterality: Right;   PORTACATH PLACEMENT Left 03/17/2020   Procedure: INSERTION PORT-A-CATH;  Surgeon: Ebbie Cough, MD;  Location: [facility];  Service: General;  Laterality: Left;   PORTACATH PLACEMENT Left 08/05/2021   Procedure: PORT PLACEMENT WITH ULTRASOUND GUIDANCE;  Surgeon: Ebbie Cough, MD;  Location: [facility];  Service: General;  Laterality: Left;  LMA   Family History  Problem Relation Age of Onset   Cancer Mother    Breast cancer Mother    Diabetes Mother    Hypertension Mother    Hypertension Father    Hypertension Sister    Hypertension Brother    Outpatient Medications Prior to Visit  Medication Sig Dispense Refill   ALPRAZolam  (XANAX ) 0.25 MG tablet Take 1 tablet (0.25 mg total) by mouth 2 (two) times daily as needed for anxiety. 60 tablet 3   amLODipine  (NORVASC ) 5 MG tablet Take 1 tablet (5 mg total) by mouth daily. 90 tablet 3   carvedilol  (COREG ) 3.125 MG tablet Take 1 tablet (3.125 mg total) by mouth 2 (two) times daily. 180 tablet 3   esomeprazole (NEXIUM) 20 MG capsule Take 20 mg by mouth daily.     gabapentin  (NEURONTIN ) 600 MG tablet Take 1 tablet (600 mg total) by mouth 3 (three) times daily. 270 tablet 3   GNP MAGNESIUM GLYCINATE PO Take 200 mg by mouth.     lidocaine -prilocaine  (EMLA ) cream Apply topically once.     ondansetron  (ZOFRAN ) 8 MG tablet Take 1 tablet (8 mg total) by mouth every 8 (eight) hours as needed for nausea. Take the 3rd day after chemo 30 tablet 3   OVER THE COUNTER MEDICATION Stool softener     prochlorperazine  (COMPAZINE ) 10 MG tablet Take 1 tablet (10 mg total) by mouth every 6 (six) hours as needed for nausea or vomiting. 30 tablet 3   Semaglutide , 1 MG/DOSE, 4 MG/3ML SOPN Inject 1 mg as directed  once a week. 6 mL 3   No facility-administered medications prior to visit.   No Known Allergies   ROS: A complete ROS was performed with pertinent positives/negatives noted in the HPI. The remainder of the ROS are negative.    Objective:   Today's Vitals   08/11/23 1020  BP: 138/69  Pulse: 78  Temp: 98.2 F (36.8 C)  TempSrc: Oral  SpO2: 100%  Weight: 184 lb 6.4 oz (83.6 kg)  Height: 5' 6 (1.676 m)    Physical Exam   GENERAL: Well-appearing, in NAD. Well nourished.  SKIN: Pink, warm and dry. No rash, lesion, ulceration, or ecchymoses.  Head: Normocephalic. NECK: Trachea midline. Full ROM w/o pain or tenderness. RESPIRATORY: Chest wall symmetrical. Respirations even and non-labored. Breath sounds clear to auscultation bilaterally.  CARDIAC: S1, S2 present, regular rate and rhythm without murmur or gallops. Peripheral pulses 2+ bilaterally.  MSK: Muscle tone and strength appropriate for age.  NEUROLOGIC: No motor deficits. Neuropathy present to bilateral feet.  Steady, even gait. C2-C12 intact.  PSYCH/MENTAL STATUS: Alert, oriented x 3. Cooperative, appropriate mood and affect.   Diabetic Foot Exam - Simple   Simple Foot Form Diabetic Foot exam was performed with the following findings: Yes 08/11/2023 10:20 AM  Visual Inspection No deformities, no ulcerations, no other skin breakdown bilaterally: Yes Sensation Testing See comments: Yes Pulse Check Posterior Tibialis and Dorsalis pulse intact bilaterally: Yes Comments Mild neuropathy present to mid foot and 3rd-5th toes bilaterally       Health Maintenance Due  Topic Date Due   OPHTHALMOLOGY EXAM  Never done   Hepatitis C Screening  Never done   Zoster Vaccines- Shingrix (1 of 2) Never done   Fecal DNA (Cologuard)  Never done   COVID-19 Vaccine (4 - 2024-25 season) 09/04/2022   MAMMOGRAM  03/25/2023   Medicare Annual Wellness (AWV)  08/02/2023   INFLUENZA VACCINE  08/04/2023    Results for orders placed or  performed in visit on 08/11/23  POCT UA - Microalbumin  Result Value Ref Range   Microalbumin Ur, POC 10 mg/L   Creatinine, POC 50 mg/dL   Albumin/Creatinine Ratio, Urine, POC 30-300     The 10-year ASCVD risk score (Arnett DK, et al., 2019) is: 32.7%     Assessment & Plan:  1. Type 2 diabetes mellitus without complication, without long-term current use of insulin (HCC) (Primary) Will switch from Ozempic  1 mg to Mounjaro  2.5 mg.  Recommend patient take a 2-week pause in between this medication switch and she verbalized understanding.  Urine albumin within normal limits today.  Foot exam completed with chronic chemo induced peripheral neuropathy present.  Discussed possible adverse and side effects of Mounjaro  with patient and she verbalized understanding.  We will start Mounjaro  at 2.5 mg weekly for 4 weeks and increase to 5 mg.  Plan to follow-up in 3 months. - tirzepatide  (MOUNJARO ) 2.5 MG/0.5ML Pen; Inject 2.5 mg into the skin once a week.  Dispense: 2 mL; Refill: 0 - POCT UA - Microalbumin  2. Aortic atherosclerosis (HCC) Discussed benefits of statin therapy for prevention of CVD progression and atherosclerosis present on previous exams.  Will obtain lipid panel in the next week while fasting and recommend statin therapy. - Lipid panel; Future  3. Chemotherapy-induced peripheral neuropathy (HCC) Currently controlled with gabapentin .  Will continue on current regimen safe use of medication reviewed with patient.  4. Malignant neoplasm of upper-outer quadrant of right breast in female, estrogen receptor positive (HCC) Currently followed by Dr. Odean. Patient inquiring about mammogram. Will consult with Dr. Odean regarding this.   5. Essential (primary) hypertension Controlled.  Continue amlodipine  and Coreg  as directed.  Recommend monitoring BP regularly at home   Meds ordered this encounter  Medications   tirzepatide  (MOUNJARO ) 2.5 MG/0.5ML Pen    Sig: Inject 2.5 mg into the  skin once a week.    Dispense:  2 mL    Refill:  0    Supervising Provider:   DE PERU, RAYMOND J [[id number]]   tirzepatide  (MOUNJARO ) 5 MG/0.5ML Pen    Sig: Inject 5 mg into the skin once a week.    Dispense:  6 mL    Refill:  2    Supervising Provider:   DE PERU, RAYMOND J [[id number]]   Lab Orders         Lipid panel         POCT UA - Microalbumin  Return in about 3 months (around 11/11/2023) for DIABETES CHECK UP, Atherosclerosis check up .    Patient to reach out to office if new, worrisome, or unresolved symptoms arise or if no improvement in patient's condition. Patient verbalized understanding and is agreeable to treatment plan. All questions answered to patient's satisfaction.    Sabrina Woods, [place]

## 2023-08-15 ENCOUNTER — Encounter (HOSPITAL_BASED_OUTPATIENT_CLINIC_OR_DEPARTMENT_OTHER): Payer: Self-pay | Admitting: Family Medicine

## 2023-08-22 ENCOUNTER — Ambulatory Visit: Payer: Medicare Other | Admitting: Hematology and Oncology

## 2023-08-22 ENCOUNTER — Other Ambulatory Visit: Payer: Medicare Other

## 2023-08-22 ENCOUNTER — Ambulatory Visit: Payer: Medicare Other

## 2023-09-06 ENCOUNTER — Ambulatory Visit (HOSPITAL_COMMUNITY): Payer: Self-pay | Admitting: Cardiology

## 2023-09-11 MED FILL — Fosaprepitant Dimeglumine For IV Infusion 150 MG (Base Eq): INTRAVENOUS | Qty: 5 | Status: AC

## 2023-09-12 ENCOUNTER — Inpatient Hospital Stay: Attending: Hematology and Oncology

## 2023-09-12 ENCOUNTER — Inpatient Hospital Stay

## 2023-09-12 ENCOUNTER — Inpatient Hospital Stay (HOSPITAL_BASED_OUTPATIENT_CLINIC_OR_DEPARTMENT_OTHER): Attending: Hematology and Oncology | Admitting: Hematology and Oncology

## 2023-09-12 VITALS — BP 140/90 | HR 90 | Temp 98.0°F | Resp 18 | Ht 66.0 in | Wt 188.1 lb

## 2023-09-12 DIAGNOSIS — R42 Dizziness and giddiness: Secondary | ICD-10-CM | POA: Diagnosis not present

## 2023-09-12 DIAGNOSIS — I10 Essential (primary) hypertension: Secondary | ICD-10-CM | POA: Insufficient documentation

## 2023-09-12 DIAGNOSIS — Z5112 Encounter for antineoplastic immunotherapy: Secondary | ICD-10-CM | POA: Insufficient documentation

## 2023-09-12 DIAGNOSIS — Z170 Estrogen receptor positive status [ER+]: Secondary | ICD-10-CM

## 2023-09-12 DIAGNOSIS — C773 Secondary and unspecified malignant neoplasm of axilla and upper limb lymph nodes: Secondary | ICD-10-CM | POA: Diagnosis not present

## 2023-09-12 DIAGNOSIS — R002 Palpitations: Secondary | ICD-10-CM | POA: Insufficient documentation

## 2023-09-12 DIAGNOSIS — C50411 Malignant neoplasm of upper-outer quadrant of right female breast: Secondary | ICD-10-CM | POA: Diagnosis not present

## 2023-09-12 LAB — CMP (CANCER CENTER ONLY)
ALT: 22 U/L (ref 0–44)
AST: 23 U/L (ref 15–41)
Albumin: 4.2 g/dL (ref 3.5–5.0)
Alkaline Phosphatase: 121 U/L (ref 38–126)
Anion gap: 6 (ref 5–15)
BUN: 10 mg/dL (ref 8–23)
CO2: 26 mmol/L (ref 22–32)
Calcium: 9.5 mg/dL (ref 8.9–10.3)
Chloride: 107 mmol/L (ref 98–111)
Creatinine: 0.61 mg/dL (ref 0.44–1.00)
GFR, Estimated: >60 mL/min (ref >60)
Glucose, Bld: 143 mg/dL — ABNORMAL HIGH (ref 70–99)
Potassium: 3.8 mmol/L (ref 3.5–5.1)
Sodium: 139 mmol/L (ref 135–145)
Total Bilirubin: 0.3 mg/dL (ref 0.0–1.2)
Total Protein: 7.2 g/dL (ref 6.5–8.1)

## 2023-09-12 LAB — CBC WITH DIFFERENTIAL (CANCER CENTER ONLY)
Abs Immature Granulocytes: 0.02 K/uL (ref 0.00–0.07)
Basophils Absolute: 0.1 K/uL (ref 0.0–0.1)
Basophils Relative: 1 %
Eosinophils Absolute: 0.2 K/uL (ref 0.0–0.5)
Eosinophils Relative: 2 %
HCT: 39.5 % (ref 36.0–46.0)
Hemoglobin: 13.7 g/dL (ref 12.0–15.0)
Immature Granulocytes: 0 %
Lymphocytes Relative: 17 %
Lymphs Abs: 1.3 K/uL (ref 0.7–4.0)
MCH: 31.4 pg (ref 26.0–34.0)
MCHC: 34.7 g/dL (ref 30.0–36.0)
MCV: 90.6 fL (ref 80.0–100.0)
Monocytes Absolute: 0.6 K/uL (ref 0.1–1.0)
Monocytes Relative: 8 %
Neutro Abs: 5.3 K/uL (ref 1.7–7.7)
Neutrophils Relative %: 72 %
Platelet Count: 164 K/uL (ref 150–400)
RBC: 4.36 MIL/uL (ref 3.87–5.11)
RDW: 13.2 % (ref 11.5–15.5)
WBC Count: 7.5 K/uL (ref 4.0–10.5)
nRBC: 0 % (ref 0.0–0.2)

## 2023-09-12 MED ORDER — FAM-TRASTUZUMAB DERUXTECAN-NXKI CHEMO 100 MG IV SOLR
3.2000 mg/kg | Freq: Once | INTRAVENOUS | Status: AC
  Administered 2023-09-12: 300 mg via INTRAVENOUS
  Filled 2023-09-12: qty 15

## 2023-09-12 MED ORDER — SODIUM CHLORIDE 0.9 % IV SOLN
150.0000 mg | Freq: Once | INTRAVENOUS | Status: AC
  Administered 2023-09-12: 150 mg via INTRAVENOUS
  Filled 2023-09-12: qty 150

## 2023-09-12 MED ORDER — DIPHENHYDRAMINE HCL 25 MG PO CAPS
25.0000 mg | ORAL_CAPSULE | Freq: Once | ORAL | Status: AC
  Administered 2023-09-12: 25 mg via ORAL
  Filled 2023-09-12: qty 1

## 2023-09-12 MED ORDER — ACETAMINOPHEN 325 MG PO TABS
650.0000 mg | ORAL_TABLET | Freq: Once | ORAL | Status: AC
  Administered 2023-09-12: 650 mg via ORAL
  Filled 2023-09-12: qty 2

## 2023-09-12 MED ORDER — PALONOSETRON HCL INJECTION 0.25 MG/5ML
0.2500 mg | Freq: Once | INTRAVENOUS | Status: AC
  Administered 2023-09-12: 0.25 mg via INTRAVENOUS
  Filled 2023-09-12: qty 5

## 2023-09-12 MED ORDER — DEXAMETHASONE SODIUM PHOSPHATE 10 MG/ML IJ SOLN
10.0000 mg | Freq: Once | INTRAMUSCULAR | Status: AC
  Administered 2023-09-12: 10 mg via INTRAVENOUS
  Filled 2023-09-12: qty 1

## 2023-09-12 MED ORDER — DEXTROSE 5 % IV SOLN: Freq: Once | INTRAVENOUS | Status: AC

## 2023-09-12 NOTE — Progress Notes (Signed)
 Patient Care Team: Knute Thersia Bitters, FNP as PCP - General (Family Medicine) Ebbie Cough, MD as Consulting Physician (General Surgery) Dewey Rush, MD as Consulting Physician (Radiation Oncology) Odean Potts, MD as Consulting Physician (Hematology and Oncology)  DIAGNOSIS:  Encounter Diagnosis  Name Primary?   Malignant neoplasm of upper-outer quadrant of right breast in female, estrogen receptor positive (HCC) Yes    SUMMARY OF ONCOLOGIC HISTORY: Oncology History  Malignant neoplasm of upper-outer quadrant of right breast in female, estrogen receptor positive (HCC)  03/10/2020 Initial Diagnosis   Palpable right breast mass x6 months.  Mammogram revealed left breast cysts (intraductal papilloma), 11 cm right breast mass and 2 masses 2.6 cm each in the right axilla: Biopsy grade 3 IDC ER 5% weak, PR 0%, Ki-67 25%, HER-2 3+ IHC positive   03/10/2020 Cancer Staging   Staging form: Breast, AJCC 8th Edition - Clinical stage from 03/10/2020: Stage IIIA (cT3, cN1, cM0, G3, ER+, PR-, HER2+) - Signed by Odean Potts, MD on 03/10/2020 Stage prefix: Initial diagnosis   03/19/2020 - 07/01/2020 Chemotherapy   [facility] Perjeta    08/04/2020 Surgery   Right mastectomy (Dr. 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, high-grade DCIS with necrosis and calcifications, and 2 right axillary lymph nodes positive for metastatic carcinoma   08/04/2020 Cancer Staging   Staging form: Breast, AJCC 8th Edition - Pathologic stage from 08/04/2020: No Stage Recommended (ypTis (DCIS), pN2, cM0) - Signed by Crawford Morna Pickle, NP on 02/05/2021 Stage prefix: Post-therapy   08/21/2020 - 10/23/2020 Chemotherapy   Discontinued due to peripheral neuropathy Patient is on Treatment Plan : BREAST ADO-Trastuzumab Emtansine  (Kadcyla ) q21d      08/2020 -  Anti-estrogen oral therapy    Decided not to do antiestrogen therapy because her ER was 0% on final path.   10/06/2020  - 11/04/2020 Radiation Therapy   Adjuvant radiation    Relapse/Recurrence   Right mastectomy area skin changes: Initially treated for shingles but since lesions did not improve skin biopsy was obtained on 07/14/2021: Infiltrating carcinoma consistent with breast origin CK7 and GA TA 3 positive, ER 0%, PR 0%, HER2 2+ by IHC, FISH negative ratio 1.56, copy #2.1   07/26/2021 -  Chemotherapy   Patient is on Treatment Plan : BREAST METASTATIC Fam-Trastuzumab Deruxtecan-nxki  (Enhertu ) (5.4) q21d     01/14/2022 Imaging   CT Chest: Slight decrease in size of the right lower lobe pulmonary nodule. Stable tiny non solid nodule in the superior segment of left lower lobe. No new pulmonary nodules. Stable surgical changes from right mastectomy and axillary lymph node dissection. No new developing lymph node enlargement or other mass lesion.  Chest port. Fatty liver infiltration. Aortic Atherosclerosis (ICD10-I70.0).     CHIEF COMPLIANT: Cycle 30 Enhertu   HISTORY OF PRESENT ILLNESS:   History of Present Illness Delona Clasby is a 68 year old female with a history of cancer undergoing chemotherapy who presents for follow-up regarding treatment side effects.  She experiences persistent fatigue, nausea, and constipation associated with chemotherapy. Nausea intensifies on Thursdays post-treatment, managed with Zofran  and Compazine , providing relief for about a day. Fatigue is most pronounced on Thursdays, gradually improving, affecting her daily activities.  She has completed thirty chemotherapy treatments, currently scheduled every six weeks. She is uncertain about the treatment duration.  She experiences palpitations and lightheadedness during activities, with a heart monitor indicating a fast heartbeat. She inquires about potential interactions between her blood pressure medications and Xanax , which she  takes occasionally.     ALLERGIES:  has no known allergies.  MEDICATIONS:  Current  Outpatient Medications  Medication Sig Dispense Refill   ALPRAZolam  (XANAX ) 0.25 MG tablet Take 1 tablet (0.25 mg total) by mouth 2 (two) times daily as needed for anxiety. 60 tablet 3   amLODipine  (NORVASC ) 5 MG tablet Take 1 tablet (5 mg total) by mouth daily. 90 tablet 3   carvedilol  (COREG ) 3.125 MG tablet Take 1 tablet (3.125 mg total) by mouth 2 (two) times daily. 180 tablet 3   esomeprazole (NEXIUM) 20 MG capsule Take 20 mg by mouth daily.     gabapentin  (NEURONTIN ) 600 MG tablet Take 1 tablet (600 mg total) by mouth 3 (three) times daily. 270 tablet 3   GNP MAGNESIUM GLYCINATE PO Take 200 mg by mouth.     lidocaine -prilocaine  (EMLA ) cream Apply topically once.     ondansetron  (ZOFRAN ) 8 MG tablet Take 1 tablet (8 mg total) by mouth every 8 (eight) hours as needed for nausea. Take the 3rd day after chemo 30 tablet 3   OVER THE COUNTER MEDICATION Stool softener     prochlorperazine  (COMPAZINE ) 10 MG tablet Take 1 tablet (10 mg total) by mouth every 6 (six) hours as needed for nausea or vomiting. 30 tablet 3   tirzepatide  (MOUNJARO ) 2.5 MG/0.5ML Pen Inject 2.5 mg into the skin once a week. 2 mL 0   tirzepatide  (MOUNJARO ) 5 MG/0.5ML Pen Inject 5 mg into the skin once a week. 6 mL 2   No current facility-administered medications for this visit.    PHYSICAL EXAMINATION: ECOG PERFORMANCE STATUS: 1 - Symptomatic but completely ambulatory  Vitals:   09/12/23 1126  BP: (!) 140/90  Pulse: 90  Resp: 18  Temp: 98 F (36.7 C)  SpO2: 100%   Filed Weights   09/12/23 1126  Weight: 188 lb 1.6 oz (85.3 kg)      LABORATORY DATA:  I have reviewed the data as listed    Latest Ref Rng & Units 08/01/2023    9:12 AM 06/20/2023    8:49 AM 05/09/2023    8:36 AM  CMP  Glucose 70 - 99 mg/dL 864  854  818   BUN 8 - 23 mg/dL 8  9  12    Creatinine 0.44 - 1.00 mg/dL 9.31  9.35  9.36   Sodium 135 - 145 mmol/L 138  137  137   Potassium 3.5 - 5.1 mmol/L 3.9  3.9  3.9   Chloride 98 - 111 mmol/L 106   105  105   CO2 22 - 32 mmol/L 26  25  26    Calcium  8.9 - 10.3 mg/dL 9.3  9.4  9.8   Total Protein 6.5 - 8.1 g/dL 7.0  7.0  7.2   Total Bilirubin 0.0 - 1.2 mg/dL 0.5  0.5  0.5   Alkaline Phos 38 - 126 U/L 117  113  126   AST 15 - 41 U/L 23  25  28    ALT 0 - 44 U/L 20  23  27      Lab Results  Component Value Date   WBC 7.5 09/12/2023   HGB 13.7 09/12/2023   HCT 39.5 09/12/2023   MCV 90.6 09/12/2023   PLT 164 09/12/2023   NEUTROABS 5.3 09/12/2023    ASSESSMENT & PLAN:  Malignant neoplasm of upper-outer quadrant of right breast in female, estrogen receptor positive (HCC) 08/04/20: Right mastectomy (Dr. Donnice Bury): grade 3, 8.5 cm invasive ductal carcinoma with  micropapillary features, high-grade DCIS with necrosis and calcifications, and 2 right axillary lymph nodes positive for metastatic carcinoma   Treatment Plan: 1. Neoadjuvant chemotherapy with [facility] Perjeta  6 cycles followed by Kadcyla  maintenance discontinued 11/11/2020 because of peripheral neuropathy 2. Followed by mastectomy with sentinel lymph node study 08/04/20 3. Followed by adjuvant radiation therapy 09/18/2020- 11/04/2020 4.  Decided not to do antiestrogen therapy because her ER was 0% on final path. 5.  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  Current treatment: Enhertu  ----------------------------------------------------------------------------------------------------------------- Right mastectomy area skin changes: Initially treated for shingles but since lesions did not improve skin biopsy was obtained on 07/14/2021: Infiltrating carcinoma consistent with breast origin CK7 and GA TA 3 positive, ER 0%, PR 0%, HER2 2+ by IHC, FISH negative ratio 1.56, copy #2.1   CT CAP 08/03/2021: Solid 0.8 cm right lower lobe lung nodule. Bone scan 08/03/2021: No definite evidence of metastatic disease  CT chest on July 21, 2022 demonstrating no evidence of metastatic disease progression . CT CAP 11/11/2022: Small groundglass nodules in the  lungs unchanged hepatic steatosis.  No evidence of metastatic disease. CT CAP 05/02/2023: Occasional small pulmonary nodules unchanged most likely benign, no lymphadenopathy, hepatic steatosis, prominent left uterine and adnexal varices.  No metastatic disease   Current treatment: Enhertu  (adjusted to every 5 weeks from 11/11/2022), cycle 30 Enhertu  toxicities:  1. Fatigue that lasted for 3-5 days 2. Nausea: Much improved 3. Insomnia 4. Neuropathy: stable most prominent of the lateral aspects of her ring finger and index fingers 5. Hair thinning 6. Diabetes: On Ozempic , we will order hemoglobin A1c today.  She has an appointment with her primary care physician coming up. 7.  Palpitations: Patient will call and make an appointment with cardiology.   ------------------------------------- Assessment and Plan Assessment & Plan Malignant neoplasm of upper-outer quadrant of right breast, female Undergoing chemotherapy for estrogen receptor-positive breast cancer. Completed 30 treatments. Long-term cessation plan uncertain due to recurrence risk. Next scan in November. - Continue chemotherapy every six weeks. - Schedule next scan in November.  Chemotherapy-induced nausea, fatigue, and constipation Nausea, fatigue, and constipation as chemotherapy side effects. Nausea managed with Zofran  and Compazine . Fatigue peaks on Thursdays. - Continue Zofran  and Compazine  as needed for nausea.  Palpitations and lightheadedness Palpitations and lightheadedness during activities. Monitor showed fast heartbeat. Cardiologist suggested medication, possibly related to chemotherapy and dehydration. - Consider starting medication for palpitations as per cardiologist's recommendation.  Hypertension On two blood pressure medications. No major interaction with Xanax , which is used as needed. - Use Xanax  as needed.      No orders of the defined types were placed in this encounter.  The patient has a good  understanding of the overall plan. she agrees with it. she will call with any problems that may develop before the next visit here. Total time spent: 30 mins including face to face time and time spent for planning, charting and co-ordination of care   Naomi MARLA Chad, MD 09/12/23

## 2023-09-12 NOTE — Assessment & Plan Note (Addendum)
 08/04/20: Right mastectomy (Dr. 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 Neoadjuvant chemotherapy with [facility] Perjeta  6 cycles followed by Kadcyla  maintenance discontinued 11/11/2020 because of peripheral neuropathy 2. Followed by mastectomy with sentinel lymph node study 08/04/20 3. Followed by adjuvant radiation therapy 09/18/2020- 11/04/2020 4.  Decided not to do antiestrogen therapy because her ER was 0% on final path. 5.  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  Current treatment: Enhertu  ----------------------------------------------------------------------------------------------------------------- Right mastectomy area skin changes: Initially treated for shingles but since lesions did not improve skin biopsy was obtained on 07/14/2021: Infiltrating carcinoma consistent with breast origin CK7 and GA TA 3 positive, ER 0%, PR 0%, HER2 2+ by IHC, FISH negative ratio 1.56, copy #2.1   CT CAP 08/03/2021: Solid 0.8 cm right lower lobe lung nodule. Bone scan 08/03/2021: No definite evidence of metastatic disease  CT chest on July 21, 2022 demonstrating no evidence of metastatic disease progression . CT CAP 11/11/2022: Small groundglass nodules in the lungs unchanged hepatic steatosis.  No evidence of metastatic disease. CT CAP 05/02/2023: Occasional small pulmonary nodules unchanged most likely benign, no lymphadenopathy, hepatic steatosis, prominent left uterine and adnexal varices.  No metastatic disease   Current treatment: Enhertu  (adjusted to every 5 weeks from 11/11/2022 changed to q 6 weeks), cycle 30 Enhertu  toxicities:  1. Fatigue that lasted for 3-5 days 2. Nausea: Much improved 3. Insomnia 4. Neuropathy: stable most prominent of the lateral aspects of her ring finger and index fingers 5. Hair thinning 6. Diabetes: On  Ozempic ,  hemoglobin A1c: 6.3.    7.  Palpitations: Patient sees cardiology.

## 2023-09-12 NOTE — Patient Instructions (Signed)

## 2023-09-12 NOTE — Patient Instructions (Signed)

## 2023-10-13 ENCOUNTER — Other Ambulatory Visit (HOSPITAL_BASED_OUTPATIENT_CLINIC_OR_DEPARTMENT_OTHER): Payer: Self-pay | Admitting: Family Medicine

## 2023-10-13 DIAGNOSIS — E119 Type 2 diabetes mellitus without complications: Secondary | ICD-10-CM

## 2023-10-17 ENCOUNTER — Other Ambulatory Visit (HOSPITAL_BASED_OUTPATIENT_CLINIC_OR_DEPARTMENT_OTHER): Payer: Self-pay | Admitting: Family Medicine

## 2023-10-17 ENCOUNTER — Other Ambulatory Visit (HOSPITAL_BASED_OUTPATIENT_CLINIC_OR_DEPARTMENT_OTHER): Payer: Self-pay

## 2023-10-17 MED ORDER — TIRZEPATIDE 5 MG/0.5ML ~~LOC~~ SOAJ
5.0000 mg | SUBCUTANEOUS | 2 refills | Status: AC
  Filled 2023-10-17: qty 6, 84d supply, fill #0
  Filled 2024-01-08: qty 6, 84d supply, fill #1

## 2023-10-17 NOTE — Telephone Encounter (Signed)
 Rx for pt's mounjaro  was sent to pharmacy for pt. Called and spoke with pt letting her know this had been done and she verbalized understanding. Nothing further needed.

## 2023-10-17 NOTE — Telephone Encounter (Signed)
 Copied from CRM [id number]. Topic: Clinical - Medication Question >> Oct 17, 2023  1:52 PM Shanda MATSU wrote: Reason for CRM: Patient is calling back to check status of med refill for med, tirzepatide  (MOUNJARO ) 5 MG/0.5ML Pen, patient is req that med be sent to  [id number] [place],  - [address] [place] [address] Phone: [phone] Fax: [phone]  Patient is req a call back once req is completed.

## 2023-10-23 MED FILL — Fosaprepitant Dimeglumine For IV Infusion 150 MG (Base Eq): INTRAVENOUS | Qty: 5 | Status: AC

## 2023-10-24 ENCOUNTER — Inpatient Hospital Stay

## 2023-10-24 ENCOUNTER — Inpatient Hospital Stay: Attending: Hematology and Oncology

## 2023-10-24 ENCOUNTER — Inpatient Hospital Stay: Admitting: Hematology and Oncology

## 2023-10-24 VITALS — BP 124/74 | HR 79 | Temp 98.7°F | Resp 18 | Ht 66.0 in | Wt 190.9 lb

## 2023-10-24 DIAGNOSIS — C50411 Malignant neoplasm of upper-outer quadrant of right female breast: Secondary | ICD-10-CM

## 2023-10-24 DIAGNOSIS — Z170 Estrogen receptor positive status [ER+]: Secondary | ICD-10-CM | POA: Diagnosis not present

## 2023-10-24 DIAGNOSIS — C792 Secondary malignant neoplasm of skin: Secondary | ICD-10-CM | POA: Insufficient documentation

## 2023-10-24 DIAGNOSIS — C773 Secondary and unspecified malignant neoplasm of axilla and upper limb lymph nodes: Secondary | ICD-10-CM | POA: Insufficient documentation

## 2023-10-24 DIAGNOSIS — Z5112 Encounter for antineoplastic immunotherapy: Secondary | ICD-10-CM | POA: Insufficient documentation

## 2023-10-24 DIAGNOSIS — Z23 Encounter for immunization: Secondary | ICD-10-CM

## 2023-10-24 DIAGNOSIS — G620 Drug-induced polyneuropathy: Secondary | ICD-10-CM | POA: Diagnosis not present

## 2023-10-24 LAB — CBC WITH DIFFERENTIAL (CANCER CENTER ONLY)
Abs Immature Granulocytes: 0.02 K/uL (ref 0.00–0.07)
Basophils Absolute: 0.1 K/uL (ref 0.0–0.1)
Basophils Relative: 1 %
Eosinophils Absolute: 0.2 K/uL (ref 0.0–0.5)
Eosinophils Relative: 2 %
HCT: 38.2 % (ref 36.0–46.0)
Hemoglobin: 13 g/dL (ref 12.0–15.0)
Immature Granulocytes: 0 %
Lymphocytes Relative: 14 %
Lymphs Abs: 1.1 K/uL (ref 0.7–4.0)
MCH: 31.1 pg (ref 26.0–34.0)
MCHC: 34 g/dL (ref 30.0–36.0)
MCV: 91.4 fL (ref 80.0–100.0)
Monocytes Absolute: 0.6 K/uL (ref 0.1–1.0)
Monocytes Relative: 7 %
Neutro Abs: 6.1 K/uL (ref 1.7–7.7)
Neutrophils Relative %: 76 %
Platelet Count: 151 K/uL (ref 150–400)
RBC: 4.18 MIL/uL (ref 3.87–5.11)
RDW: 13.4 % (ref 11.5–15.5)
WBC Count: 8 K/uL (ref 4.0–10.5)
nRBC: 0 % (ref 0.0–0.2)

## 2023-10-24 LAB — CMP (CANCER CENTER ONLY)
ALT: 19 U/L (ref 0–44)
AST: 21 U/L (ref 15–41)
Albumin: 4 g/dL (ref 3.5–5.0)
Alkaline Phosphatase: 117 U/L (ref 38–126)
Anion gap: 5 (ref 5–15)
BUN: 9 mg/dL (ref 8–23)
CO2: 26 mmol/L (ref 22–32)
Calcium: 9.5 mg/dL (ref 8.9–10.3)
Chloride: 104 mmol/L (ref 98–111)
Creatinine: 0.61 mg/dL (ref 0.44–1.00)
GFR, Estimated: >60 mL/min (ref >60)
Glucose, Bld: 135 mg/dL — ABNORMAL HIGH (ref 70–99)
Potassium: 3.8 mmol/L (ref 3.5–5.1)
Sodium: 135 mmol/L (ref 135–145)
Total Bilirubin: 0.4 mg/dL (ref 0.0–1.2)
Total Protein: 6.9 g/dL (ref 6.5–8.1)

## 2023-10-24 MED ORDER — DEXAMETHASONE SOD PHOSPHATE PF 10 MG/ML IJ SOLN
10.0000 mg | Freq: Once | INTRAMUSCULAR | Status: AC
  Administered 2023-10-24: 10 mg via INTRAVENOUS

## 2023-10-24 MED ORDER — FAM-TRASTUZUMAB DERUXTECAN-NXKI CHEMO 100 MG IV SOLR
3.2000 mg/kg | Freq: Once | INTRAVENOUS | Status: AC
  Administered 2023-10-24: 300 mg via INTRAVENOUS
  Filled 2023-10-24: qty 15

## 2023-10-24 MED ORDER — SODIUM CHLORIDE 0.9 % IV SOLN
150.0000 mg | Freq: Once | INTRAVENOUS | Status: AC
  Administered 2023-10-24: 150 mg via INTRAVENOUS
  Filled 2023-10-24: qty 150

## 2023-10-24 MED ORDER — INFLUENZA VAC SPLIT HIGH-DOSE 0.5 ML IM SUSY
0.5000 mL | PREFILLED_SYRINGE | Freq: Once | INTRAMUSCULAR | Status: AC
  Administered 2023-10-24: 0.5 mL via INTRAMUSCULAR
  Filled 2023-10-24: qty 0.5

## 2023-10-24 MED ORDER — PALONOSETRON HCL INJECTION 0.25 MG/5ML
0.2500 mg | Freq: Once | INTRAVENOUS | Status: AC
  Administered 2023-10-24: 0.25 mg via INTRAVENOUS
  Filled 2023-10-24: qty 5

## 2023-10-24 MED ORDER — ACETAMINOPHEN 325 MG PO TABS
650.0000 mg | ORAL_TABLET | Freq: Once | ORAL | Status: AC
  Administered 2023-10-24: 650 mg via ORAL
  Filled 2023-10-24: qty 2

## 2023-10-24 MED ORDER — DEXTROSE 5 % IV SOLN: Freq: Once | INTRAVENOUS | Status: AC

## 2023-10-24 MED ORDER — DIPHENHYDRAMINE HCL 25 MG PO CAPS
25.0000 mg | ORAL_CAPSULE | Freq: Once | ORAL | Status: AC
  Administered 2023-10-24: 25 mg via ORAL
  Filled 2023-10-24: qty 1

## 2023-10-24 NOTE — Patient Instructions (Signed)

## 2023-10-24 NOTE — Assessment & Plan Note (Signed)
 08/04/20: Right mastectomy (Dr. 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 Neoadjuvant chemotherapy with [facility]  6 cycles followed by Kadcyla  maintenance discontinued 11/11/2020 because of peripheral neuropathy 2. Followed by mastectomy with sentinel lymph node study 08/04/20 3. Followed by adjuvant radiation therapy 09/18/2020- 11/04/2020 4.  Decided not to do antiestrogen therapy because her ER was 0% on final path. 5.  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  Current treatment: Enhertu  ----------------------------------------------------------------------------------------------------------------- Right mastectomy area skin changes: Initially treated for shingles but since lesions did not improve skin biopsy was obtained on 07/14/2021: Infiltrating carcinoma consistent with breast origin CK7 and GA TA 3 positive, ER 0%, PR 0%, HER2 2+ by IHC, FISH negative ratio 1.56, copy #2.1   CT CAP 08/03/2021: Solid 0.8 cm right lower lobe lung nodule. Bone scan 08/03/2021: No definite evidence of metastatic disease  CT chest on July 21, 2022 demonstrating no evidence of metastatic disease progression . CT CAP 11/11/2022: Small groundglass nodules in the lungs unchanged hepatic steatosis.  No evidence of metastatic disease. CT CAP 05/02/2023: Occasional small pulmonary nodules unchanged most likely benign, no lymphadenopathy, hepatic steatosis, prominent left uterine and adnexal varices.  No metastatic disease   Current treatment: Enhertu  (adjusted to every 5 weeks from 11/11/2022), cycle 31 Enhertu  toxicities:  1. Fatigue that lasted for 3-5 days 2. Nausea: Much improved 3. Insomnia 4. Neuropathy: stable most prominent of the lateral aspects of her ring finger and index fingers 5. Hair thinning 6. Diabetes: On Ozempic , we will  order hemoglobin A1c today.  She has an appointment with her primary care physician coming up. 7.  Palpitations: Patient will call and make an appointment with cardiology.

## 2023-10-24 NOTE — Progress Notes (Signed)
 Patient Care Team: Knute Thersia Bitters, FNP as PCP - General (Family Medicine) Ebbie Cough, MD as Consulting Physician (General Surgery) Dewey Rush, MD as Consulting Physician (Radiation Oncology) Odean Potts, MD as Consulting Physician (Hematology and Oncology)  DIAGNOSIS:  Encounter Diagnosis  Name Primary?   Malignant neoplasm of upper-outer quadrant of right breast in female, estrogen receptor positive (HCC) Yes    SUMMARY OF ONCOLOGIC HISTORY: Oncology History  Malignant neoplasm of upper-outer quadrant of right breast in female, estrogen receptor positive (HCC)  03/10/2020 Initial Diagnosis   Palpable right breast mass x6 months.  Mammogram revealed left breast cysts (intraductal papilloma), 11 cm right breast mass and 2 masses 2.6 cm each in the right axilla: Biopsy grade 3 IDC ER 5% weak, PR 0%, Ki-67 25%, HER-2 3+ IHC positive   03/10/2020 Cancer Staging   Staging form: Breast, AJCC 8th Edition - Clinical stage from 03/10/2020: Stage IIIA (cT3, cN1, cM0, G3, ER+, PR-, HER2+) - Signed by Odean Potts, MD on 03/10/2020 Stage prefix: Initial diagnosis   03/19/2020 - 07/01/2020 Chemotherapy   [facility]    08/04/2020 Surgery   Right mastectomy (Dr. 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, high-grade DCIS with necrosis and calcifications, and 2 right axillary lymph nodes positive for metastatic carcinoma   08/04/2020 Cancer Staging   Staging form: Breast, AJCC 8th Edition - Pathologic stage from 08/04/2020: No Stage Recommended (ypTis (DCIS), pN2, cM0) - Signed by Crawford Morna Pickle, NP on 02/05/2021 Stage prefix: Post-therapy   08/21/2020 - 10/23/2020 Chemotherapy   Discontinued due to peripheral neuropathy Patient is on Treatment Plan : BREAST ADO-Trastuzumab Emtansine  (Kadcyla ) q21d      08/2020 -  Anti-estrogen oral therapy    Decided not to do antiestrogen therapy because her ER was 0% on final path.   10/06/2020  - 11/04/2020 Radiation Therapy   Adjuvant radiation    Relapse/Recurrence   Right mastectomy area skin changes: Initially treated for shingles but since lesions did not improve skin biopsy was obtained on 07/14/2021: Infiltrating carcinoma consistent with breast origin CK7 and GA TA 3 positive, ER 0%, PR 0%, HER2 2+ by IHC, FISH negative ratio 1.56, copy #2.1   07/26/2021 -  Chemotherapy   Patient is on Treatment Plan : BREAST METASTATIC Fam-Trastuzumab Deruxtecan-nxki  (Enhertu ) (5.4) q21d     01/14/2022 Imaging   CT Chest: Slight decrease in size of the right lower lobe pulmonary nodule. Stable tiny non solid nodule in the superior segment of left lower lobe. No new pulmonary nodules. Stable surgical changes from right mastectomy and axillary lymph node dissection. No new developing lymph node enlargement or other mass lesion.  Chest port. Fatty liver infiltration. Aortic Atherosclerosis (ICD10-I70.0).     CHIEF COMPLIANT: Follow-up on Enhertu   HISTORY OF PRESENT ILLNESS:   History of Present Illness Lucilia Yanni is a 68 year old female with estrogen receptor positive breast cancer who presents for routine follow-up in hematology and medical oncology.  She is undergoing treatment for estrogen receptor positive breast cancer and experiences fatigue, low appetite, nausea, and constipation for a couple of days following her treatments.  Her diabetes is being monitored, with her last A1c at 6.3. She plans to start a higher dose of her diabetes medication at 5 mg this week. She has gained weight recently and plans to increase her physical activity by using her treadmill more regularly.  She inquires about receiving a flu shot, preferring to receive it at the  current visit.     ALLERGIES:  has no known allergies.  MEDICATIONS:  Current Outpatient Medications  Medication Sig Dispense Refill   ALPRAZolam  (XANAX ) 0.25 MG tablet Take 1 tablet (0.25 mg total) by mouth 2 (two) times  daily as needed for anxiety. 60 tablet 3   amLODipine  (NORVASC ) 5 MG tablet Take 1 tablet (5 mg total) by mouth daily. 90 tablet 3   carvedilol  (COREG ) 3.125 MG tablet Take 1 tablet (3.125 mg total) by mouth 2 (two) times daily. 180 tablet 3   esomeprazole (NEXIUM) 20 MG capsule Take 20 mg by mouth daily.     gabapentin  (NEURONTIN ) 600 MG tablet Take 1 tablet (600 mg total) by mouth 3 (three) times daily. 270 tablet 3   GNP MAGNESIUM GLYCINATE PO Take 200 mg by mouth.     lidocaine -prilocaine  (EMLA ) cream Apply topically once.     ondansetron  (ZOFRAN ) 8 MG tablet Take 1 tablet (8 mg total) by mouth every 8 (eight) hours as needed for nausea. Take the 3rd day after chemo 30 tablet 3   OVER THE COUNTER MEDICATION Stool softener     prochlorperazine  (COMPAZINE ) 10 MG tablet Take 1 tablet (10 mg total) by mouth every 6 (six) hours as needed for nausea or vomiting. 30 tablet 3   tirzepatide  (MOUNJARO ) 5 MG/0.5ML Pen Inject 5 mg into the skin once a week. 6 mL 2   No current facility-administered medications for this visit.    PHYSICAL EXAMINATION: ECOG PERFORMANCE STATUS: 1 - Symptomatic but completely ambulatory  Vitals:   10/24/23 1100  BP: 124/74  Pulse: 79  Resp: 18  Temp: 98.7 F (37.1 C)  SpO2: 100%   Filed Weights   10/24/23 1100  Weight: 190 lb 14.4 oz (86.6 kg)      LABORATORY DATA:  I have reviewed the data as listed    Latest Ref Rng & Units 09/12/2023   10:58 AM 08/01/2023    9:12 AM 06/20/2023    8:49 AM  CMP  Glucose 70 - 99 mg/dL 856  864  854   BUN 8 - 23 mg/dL 10  8  9    Creatinine 0.44 - 1.00 mg/dL 9.38  9.31  9.35   Sodium 135 - 145 mmol/L 139  138  137   Potassium 3.5 - 5.1 mmol/L 3.8  3.9  3.9   Chloride 98 - 111 mmol/L 107  106  105   CO2 22 - 32 mmol/L 26  26  25    Calcium  8.9 - 10.3 mg/dL 9.5  9.3  9.4   Total Protein 6.5 - 8.1 g/dL 7.2  7.0  7.0   Total Bilirubin 0.0 - 1.2 mg/dL 0.3  0.5  0.5   Alkaline Phos 38 - 126 U/L 121  117  113   AST 15 - 41  U/L 23  23  25    ALT 0 - 44 U/L 22  20  23      Lab Results  Component Value Date   WBC 8.0 10/24/2023   HGB 13.0 10/24/2023   HCT 38.2 10/24/2023   MCV 91.4 10/24/2023   PLT 151 10/24/2023   NEUTROABS 6.1 10/24/2023    ASSESSMENT & PLAN:  Malignant neoplasm of upper-outer quadrant of right breast in female, estrogen receptor positive (HCC) 08/04/20: Right mastectomy (Dr. 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 Neoadjuvant chemotherapy with [facility] Perjeta  6 cycles followed by Kadcyla  maintenance discontinued 11/11/2020 because of peripheral neuropathy 2. Followed by mastectomy with sentinel lymph node study 08/04/20 3. Followed by adjuvant radiation therapy 09/18/2020- 11/04/2020 4.  Decided not to do antiestrogen therapy because her ER was 0% on final path. 5.  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  Current treatment: Enhertu  ----------------------------------------------------------------------------------------------------------------- Right mastectomy area skin changes: Initially treated for shingles but since lesions did not improve skin biopsy was obtained on 07/14/2021: Infiltrating carcinoma consistent with breast origin CK7 and GA TA 3 positive, ER 0%, PR 0%, HER2 2+ by IHC, FISH negative ratio 1.56, copy #2.1   CT CAP 08/03/2021: Solid 0.8 cm right lower lobe lung nodule. Bone scan 08/03/2021: No definite evidence of metastatic disease  CT chest on July 21, 2022 demonstrating no evidence of metastatic disease progression . CT CAP 11/11/2022: Small groundglass nodules in the lungs unchanged hepatic steatosis.  No evidence of metastatic disease. CT CAP 05/02/2023: Occasional small pulmonary nodules unchanged most likely benign, no lymphadenopathy, hepatic steatosis, prominent left uterine and adnexal varices.   No metastatic disease   Current treatment: Enhertu  (adjusted to every 5 weeks from 11/11/2022), cycle 31 Enhertu  toxicities:  1. Fatigue that lasted for 3-5 days 2. Nausea: Much improved 3. Insomnia 4. Neuropathy: stable most prominent of the lateral aspects of her ring finger and index fingers 5. Hair thinning 6. Diabetes: On Ozempic , hemoglobin A1c 6.3   CT scans to be done in the end of November.  We will review the results when she comes back for her treatment in December.    No orders of the defined types were placed in this encounter.  The patient has a good understanding of the overall plan. she agrees with it. she will call with any problems that may develop before the next visit here.  I personally spent a total of 30 minutes in the care of the patient today including preparing to see the patient, getting/reviewing separately obtained history, performing a medically appropriate exam/evaluation, counseling and educating, placing orders, referring and communicating with other health care professionals, documenting clinical information in the EHR, independently interpreting results, communicating results, and coordinating care.   Viinay K Chyanna Flock, MD 10/24/23

## 2023-11-28 ENCOUNTER — Ambulatory Visit (HOSPITAL_COMMUNITY)
Admission: RE | Admit: 2023-11-28 | Discharge: 2023-11-28 | Disposition: A | Discharge status: Home / Self Care | Source: Ambulatory Visit | Attending: Hematology and Oncology | Admitting: Hematology and Oncology

## 2023-11-28 DIAGNOSIS — C50411 Malignant neoplasm of upper-outer quadrant of right female breast: Secondary | ICD-10-CM | POA: Diagnosis present

## 2023-11-28 DIAGNOSIS — Z170 Estrogen receptor positive status [ER+]: Secondary | ICD-10-CM | POA: Insufficient documentation

## 2023-11-28 MED ORDER — HEPARIN SOD (PORK) LOCK FLUSH 100 UNIT/ML IV SOLN
500.0000 [IU] | Freq: Once | INTRAVENOUS | Status: AC
  Administered 2023-11-28: 500 [IU] via INTRAVENOUS

## 2023-11-28 MED ORDER — IOHEXOL 300 MG/ML  SOLN
100.0000 mL | Freq: Once | INTRAMUSCULAR | Status: AC | PRN
  Administered 2023-11-28: 100 mL via INTRAVENOUS

## 2023-11-28 MED ORDER — HEPARIN SOD (PORK) LOCK FLUSH 100 UNIT/ML IV SOLN
INTRAVENOUS | Status: AC
  Filled 2023-11-28: qty 5

## 2023-11-28 MED ORDER — SODIUM CHLORIDE (PF) 0.9 % IJ SOLN
INTRAMUSCULAR | Status: AC
  Filled 2023-11-28: qty 50

## 2023-12-04 MED FILL — Fosaprepitant Dimeglumine For IV Infusion 150 MG (Base Eq): INTRAVENOUS | Qty: 5 | Status: AC

## 2023-12-04 NOTE — Assessment & Plan Note (Signed)
 08/04/20: Right mastectomy (Dr. 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 Neoadjuvant chemotherapy with [facility] Perjeta  6 cycles followed by Kadcyla  maintenance discontinued 11/11/2020 because of peripheral neuropathy 2. Followed by mastectomy with sentinel lymph node study 08/04/20 3. Followed by adjuvant radiation therapy 09/18/2020- 11/04/2020 4.  Decided not to do antiestrogen therapy because her ER was 0% on final path. 5.  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  Current treatment: Enhertu  ----------------------------------------------------------------------------------------------------------------- Right mastectomy area skin changes: Initially treated for shingles but since lesions did not improve skin biopsy was obtained on 07/14/2021: Infiltrating carcinoma consistent with breast origin CK7 and GA TA 3 positive, ER 0%, PR 0%, HER2 2+ by IHC, FISH negative ratio 1.56, copy #2.1   CT CAP 08/03/2021: Solid 0.8 cm right lower lobe lung nodule. Bone scan 08/03/2021: No definite evidence of metastatic disease  CT chest on July 21, 2022 demonstrating no evidence of metastatic disease progression . CT CAP 11/11/2022: Small groundglass nodules in the lungs unchanged hepatic steatosis.  No evidence of metastatic disease. CT CAP 05/02/2023: Occasional small pulmonary nodules unchanged most likely benign, no lymphadenopathy, hepatic steatosis, prominent left uterine and adnexal varices.  No metastatic disease   Current treatment: Enhertu  (adjusted to every 5 weeks from 11/11/2022), cycle 32 Enhertu  toxicities:  1. Fatigue that lasted for 3-5 days 2. Nausea: Much improved 3. Insomnia 4. Neuropathy: stable most prominent of the lateral aspects of her ring finger and index fingers 5. Hair thinning 6. Diabetes: On Ozempic , hemoglobin  A1c 6.3 CT CAP 11/28/2023: Scan has not been read.

## 2023-12-05 ENCOUNTER — Inpatient Hospital Stay: Admitting: Hematology and Oncology

## 2023-12-05 ENCOUNTER — Inpatient Hospital Stay: Attending: Hematology and Oncology

## 2023-12-05 ENCOUNTER — Inpatient Hospital Stay

## 2023-12-05 ENCOUNTER — Other Ambulatory Visit: Payer: Self-pay | Admitting: *Deleted

## 2023-12-05 VITALS — BP 140/88 | HR 87 | Temp 97.4°F | Resp 18 | Ht 66.0 in | Wt 191.0 lb

## 2023-12-05 VITALS — BP 138/68 | HR 72 | Temp 98.1°F | Resp 18

## 2023-12-05 DIAGNOSIS — Z170 Estrogen receptor positive status [ER+]: Secondary | ICD-10-CM

## 2023-12-05 DIAGNOSIS — I700 Atherosclerosis of aorta: Secondary | ICD-10-CM | POA: Insufficient documentation

## 2023-12-05 DIAGNOSIS — Z5112 Encounter for antineoplastic immunotherapy: Secondary | ICD-10-CM | POA: Diagnosis present

## 2023-12-05 DIAGNOSIS — R918 Other nonspecific abnormal finding of lung field: Secondary | ICD-10-CM | POA: Diagnosis not present

## 2023-12-05 DIAGNOSIS — C773 Secondary and unspecified malignant neoplasm of axilla and upper limb lymph nodes: Secondary | ICD-10-CM | POA: Diagnosis not present

## 2023-12-05 DIAGNOSIS — K760 Fatty (change of) liver, not elsewhere classified: Secondary | ICD-10-CM | POA: Insufficient documentation

## 2023-12-05 DIAGNOSIS — G620 Drug-induced polyneuropathy: Secondary | ICD-10-CM | POA: Insufficient documentation

## 2023-12-05 DIAGNOSIS — C50411 Malignant neoplasm of upper-outer quadrant of right female breast: Secondary | ICD-10-CM | POA: Diagnosis not present

## 2023-12-05 DIAGNOSIS — Z79899 Other long term (current) drug therapy: Secondary | ICD-10-CM

## 2023-12-05 DIAGNOSIS — E119 Type 2 diabetes mellitus without complications: Secondary | ICD-10-CM | POA: Insufficient documentation

## 2023-12-05 LAB — CBC WITH DIFFERENTIAL (CANCER CENTER ONLY)
Abs Immature Granulocytes: 0.01 K/uL (ref 0.00–0.07)
Basophils Absolute: 0.1 K/uL (ref 0.0–0.1)
Basophils Relative: 2 %
Eosinophils Absolute: 0.2 K/uL (ref 0.0–0.5)
Eosinophils Relative: 2 %
HCT: 39.7 % (ref 36.0–46.0)
Hemoglobin: 13.6 g/dL (ref 12.0–15.0)
Immature Granulocytes: 0 %
Lymphocytes Relative: 18 %
Lymphs Abs: 1.2 K/uL (ref 0.7–4.0)
MCH: 31.1 pg (ref 26.0–34.0)
MCHC: 34.3 g/dL (ref 30.0–36.0)
MCV: 90.8 fL (ref 80.0–100.0)
Monocytes Absolute: 0.5 K/uL (ref 0.1–1.0)
Monocytes Relative: 8 %
Neutro Abs: 4.7 K/uL (ref 1.7–7.7)
Neutrophils Relative %: 70 %
Platelet Count: 175 K/uL (ref 150–400)
RBC: 4.37 MIL/uL (ref 3.87–5.11)
RDW: 13.1 % (ref 11.5–15.5)
WBC Count: 6.8 K/uL (ref 4.0–10.5)
nRBC: 0 % (ref 0.0–0.2)

## 2023-12-05 LAB — CMP (CANCER CENTER ONLY)
ALT: 24 U/L (ref 0–44)
AST: 31 U/L (ref 15–41)
Albumin: 4.2 g/dL (ref 3.5–5.0)
Alkaline Phosphatase: 129 U/L — ABNORMAL HIGH (ref 38–126)
Anion gap: 9 (ref 5–15)
BUN: 8 mg/dL (ref 8–23)
CO2: 24 mmol/L (ref 22–32)
Calcium: 9.5 mg/dL (ref 8.9–10.3)
Chloride: 102 mmol/L (ref 98–111)
Creatinine: 0.65 mg/dL (ref 0.44–1.00)
GFR, Estimated: >60 mL/min
Glucose, Bld: 135 mg/dL — ABNORMAL HIGH (ref 70–99)
Potassium: 4 mmol/L (ref 3.5–5.1)
Sodium: 135 mmol/L (ref 135–145)
Total Bilirubin: 0.4 mg/dL (ref 0.0–1.2)
Total Protein: 7.1 g/dL (ref 6.5–8.1)

## 2023-12-05 MED ORDER — DIPHENHYDRAMINE HCL 25 MG PO CAPS
25.0000 mg | ORAL_CAPSULE | Freq: Once | ORAL | Status: AC
  Administered 2023-12-05: 25 mg via ORAL
  Filled 2023-12-05: qty 1

## 2023-12-05 MED ORDER — PALONOSETRON HCL INJECTION 0.25 MG/5ML
0.2500 mg | Freq: Once | INTRAVENOUS | Status: AC
  Administered 2023-12-05: 0.25 mg via INTRAVENOUS
  Filled 2023-12-05: qty 5

## 2023-12-05 MED ORDER — FAM-TRASTUZUMAB DERUXTECAN-NXKI CHEMO 100 MG IV SOLR
3.2000 mg/kg | Freq: Once | INTRAVENOUS | Status: AC
  Administered 2023-12-05: 300 mg via INTRAVENOUS
  Filled 2023-12-05: qty 15

## 2023-12-05 MED ORDER — SODIUM CHLORIDE 0.9 % IV SOLN
150.0000 mg | Freq: Once | INTRAVENOUS | Status: AC
  Administered 2023-12-05: 150 mg via INTRAVENOUS
  Filled 2023-12-05: qty 150

## 2023-12-05 MED ORDER — DEXAMETHASONE SODIUM PHOSPHATE 10 MG/ML IJ SOLN
10.0000 mg | Freq: Once | INTRAMUSCULAR | Status: AC
  Administered 2023-12-05: 10 mg via INTRAVENOUS

## 2023-12-05 MED ORDER — ACETAMINOPHEN 325 MG PO TABS
650.0000 mg | ORAL_TABLET | Freq: Once | ORAL | Status: AC
  Administered 2023-12-05: 650 mg via ORAL
  Filled 2023-12-05: qty 2

## 2023-12-05 MED ORDER — DEXTROSE 5 % IV SOLN: Freq: Once | INTRAVENOUS | Status: AC

## 2023-12-05 NOTE — Progress Notes (Signed)
 Patient Care Team: Knute Thersia Bitters, FNP as PCP - General (Family Medicine) Ebbie Cough, MD as Consulting Physician (General Surgery) Dewey Rush, MD as Consulting Physician (Radiation Oncology) Odean Potts, MD as Consulting Physician (Hematology and Oncology)  DIAGNOSIS:  Encounter Diagnosis  Name Primary?   Malignant neoplasm of upper-outer quadrant of right breast in female, estrogen receptor positive (HCC) Yes    SUMMARY OF ONCOLOGIC HISTORY: Oncology History  Malignant neoplasm of upper-outer quadrant of right breast in female, estrogen receptor positive (HCC)  03/10/2020 Initial Diagnosis   Palpable right breast mass x6 months.  Mammogram revealed left breast cysts (intraductal papilloma), 11 cm right breast mass and 2 masses 2.6 cm each in the right axilla: Biopsy grade 3 IDC ER 5% weak, PR 0%, Ki-67 25%, HER-2 3+ IHC positive   03/10/2020 Cancer Staging   Staging form: Breast, AJCC 8th Edition - Clinical stage from 03/10/2020: Stage IIIA (cT3, cN1, cM0, G3, ER+, PR-, HER2+) - Signed by Odean Potts, MD on 03/10/2020 Stage prefix: Initial diagnosis   03/19/2020 - 07/01/2020 Chemotherapy   [facility]    08/04/2020 Surgery   Right mastectomy (Dr. 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, high-grade DCIS with necrosis and calcifications, and 2 right axillary lymph nodes positive for metastatic carcinoma   08/04/2020 Cancer Staging   Staging form: Breast, AJCC 8th Edition - Pathologic stage from 08/04/2020: No Stage Recommended (ypTis (DCIS), pN2, cM0) - Signed by Crawford Morna Pickle, NP on 02/05/2021 Stage prefix: Post-therapy   08/21/2020 - 10/23/2020 Chemotherapy   Discontinued due to peripheral neuropathy Patient is on Treatment Plan : BREAST ADO-Trastuzumab Emtansine  (Kadcyla ) q21d      08/2020 -  Anti-estrogen oral therapy    Decided not to do antiestrogen therapy because her ER was 0% on final path.   10/06/2020  - 11/04/2020 Radiation Therapy   Adjuvant radiation    Relapse/Recurrence   Right mastectomy area skin changes: Initially treated for shingles but since lesions did not improve skin biopsy was obtained on 07/14/2021: Infiltrating carcinoma consistent with breast origin CK7 and GA TA 3 positive, ER 0%, PR 0%, HER2 2+ by IHC, FISH negative ratio 1.56, copy #2.1   07/26/2021 -  Chemotherapy   Patient is on Treatment Plan : BREAST METASTATIC Fam-Trastuzumab Deruxtecan-nxki  (Enhertu ) (5.4) q21d     01/14/2022 Imaging   CT Chest: Slight decrease in size of the right lower lobe pulmonary nodule. Stable tiny non solid nodule in the superior segment of left lower lobe. No new pulmonary nodules. Stable surgical changes from right mastectomy and axillary lymph node dissection. No new developing lymph node enlargement or other mass lesion.  Chest port. Fatty liver infiltration. Aortic Atherosclerosis (ICD10-I70.0).     CHIEF COMPLIANT: Follow-up on Enhertu   HISTORY OF PRESENT ILLNESS: History of Present Illness Sabrina Woods is a 68 year old female who presents for follow-up on recent scan results.  Recent imaging showed stable tiny lung nodules unchanged from prior studies and a new vague indeterminate 1 cm liver lesion. She has no new or worsening respiratory, hepatic, or systemic symptoms related to these findings.   ALLERGIES:  has no known allergies.  MEDICATIONS:  Current Outpatient Medications  Medication Sig Dispense Refill   ALPRAZolam  (XANAX ) 0.25 MG tablet Take 1 tablet (0.25 mg total) by mouth 2 (two) times daily as needed for anxiety. 60 tablet 3   amLODipine  (NORVASC ) 5 MG tablet Take 1 tablet (5 mg total) by mouth daily.  90 tablet 3   carvedilol  (COREG ) 3.125 MG tablet Take 1 tablet (3.125 mg total) by mouth 2 (two) times daily. 180 tablet 3   esomeprazole (NEXIUM) 20 MG capsule Take 20 mg by mouth daily.     gabapentin  (NEURONTIN ) 600 MG tablet Take 1 tablet (600 mg total)  by mouth 3 (three) times daily. 270 tablet 3   GNP MAGNESIUM GLYCINATE PO Take 200 mg by mouth.     lidocaine -prilocaine  (EMLA ) cream Apply topically once.     ondansetron  (ZOFRAN ) 8 MG tablet Take 1 tablet (8 mg total) by mouth every 8 (eight) hours as needed for nausea. Take the 3rd day after chemo 30 tablet 3   OVER THE COUNTER MEDICATION Stool softener     prochlorperazine  (COMPAZINE ) 10 MG tablet Take 1 tablet (10 mg total) by mouth every 6 (six) hours as needed for nausea or vomiting. 30 tablet 3   tirzepatide  (MOUNJARO ) 5 MG/0.5ML Pen Inject 5 mg into the skin once a week. 6 mL 2   No current facility-administered medications for this visit.    PHYSICAL EXAMINATION: ECOG PERFORMANCE STATUS: 1 - Symptomatic but completely ambulatory  Vitals:   12/05/23 0945  BP: (!) 140/88  Pulse: 87  Resp: 18  Temp: (!) 97.4 F (36.3 C)  SpO2: 100%   Filed Weights   12/05/23 0945  Weight: 191 lb (86.6 kg)    LABORATORY DATA:  I have reviewed the data as listed    Latest Ref Rng & Units 10/24/2023   11:19 AM 09/12/2023   10:58 AM 08/01/2023    9:12 AM  CMP  Glucose 70 - 99 mg/dL 864  856  864   BUN 8 - 23 mg/dL 9  10  8    Creatinine 0.44 - 1.00 mg/dL 9.38  9.38  9.31   Sodium 135 - 145 mmol/L 135  139  138   Potassium 3.5 - 5.1 mmol/L 3.8  3.8  3.9   Chloride 98 - 111 mmol/L 104  107  106   CO2 22 - 32 mmol/L 26  26  26    Calcium  8.9 - 10.3 mg/dL 9.5  9.5  9.3   Total Protein 6.5 - 8.1 g/dL 6.9  7.2  7.0   Total Bilirubin 0.0 - 1.2 mg/dL 0.4  0.3  0.5   Alkaline Phos 38 - 126 U/L 117  121  117   AST 15 - 41 U/L 21  23  23    ALT 0 - 44 U/L 19  22  20      Lab Results  Component Value Date   WBC 6.8 12/05/2023   HGB 13.6 12/05/2023   HCT 39.7 12/05/2023   MCV 90.8 12/05/2023   PLT 175 12/05/2023   NEUTROABS 4.7 12/05/2023    ASSESSMENT & PLAN:  Malignant neoplasm of upper-outer quadrant of right breast in female, estrogen receptor positive (HCC) 08/04/20: Right mastectomy  (Dr. 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 Neoadjuvant chemotherapy with [facility] Perjeta  6 cycles followed by Kadcyla  maintenance discontinued 11/11/2020 because of peripheral neuropathy 2. Followed by mastectomy with sentinel lymph node study 08/04/20 3. Followed by adjuvant radiation therapy 09/18/2020- 11/04/2020 4.  Decided not to do antiestrogen therapy because her ER was 0% on final path. 5.  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  Current treatment: Enhertu  ----------------------------------------------------------------------------------------------------------------- Right mastectomy area skin  changes: Initially treated for shingles but since lesions did not improve skin biopsy was obtained on 07/14/2021: Infiltrating carcinoma consistent with breast origin CK7 and GA TA 3 positive, ER 0%, PR 0%, HER2 2+ by IHC, FISH negative ratio 1.56, copy #2.1   CT CAP 08/03/2021: Solid 0.8 cm right lower lobe lung nodule. Bone scan 08/03/2021: No definite evidence of metastatic disease  CT chest on July 21, 2022 demonstrating no evidence of metastatic disease progression . CT CAP 11/11/2022: Small groundglass nodules in the lungs unchanged hepatic steatosis.  No evidence of metastatic disease. CT CAP 05/02/2023: Occasional small pulmonary nodules unchanged most likely benign, no lymphadenopathy, hepatic steatosis, prominent left uterine and adnexal varices.  No metastatic disease CT CAP 11/28/2023: Increased conspicuity of 10 mm hypodensity liver indeterminate, unchanged ground glass pulmonary nodules measuring 6 mm, mild splenomegaly   Current treatment: Enhertu  (adjusted to every 5 weeks from 11/11/2022), cycle 32 Enhertu  toxicities:  1. Fatigue that lasted for 3-5 days 2. Nausea: Much improved 3. Insomnia 4. Neuropathy:  stable most prominent of the lateral aspects of her ring finger and index fingers 5. Hair thinning 6. Diabetes: On Ozempic , hemoglobin A1c 6.3  ------------------------------------- Assessment and Plan Assessment & Plan Estrogen receptor positive malignant neoplasm of upper-outer quadrant of right breast No new findings or changes in breast cancer status. Recent imaging shows no significant changes. - Continue current management and monitoring schedule.  Indeterminate liver lesion 1 cm indeterminate liver lesion identified. Considered vague and not concerning. Differential includes fluid pocket or undrained vial. No significant change from previous imaging. - Continue routine imaging every six months.  Stable pulmonary nodules Pulmonary nodules unchanged. No new concerning features. - Continue routine monitoring with imaging every six months.      No orders of the defined types were placed in this encounter.  The patient has a good understanding of the overall plan. she agrees with it. she will call with any problems that may develop before the next visit here.  I personally spent a total of 30 minutes in the care of the patient today including preparing to see the patient, getting/reviewing separately obtained history, performing a medically appropriate exam/evaluation, counseling and educating, placing orders, referring and communicating with other health care professionals, documenting clinical information in the EHR, independently interpreting results, communicating results, and coordinating care.   Viinay K Allora Bains, MD 12/05/23

## 2023-12-07 ENCOUNTER — Other Ambulatory Visit: Payer: Self-pay

## 2023-12-19 ENCOUNTER — Other Ambulatory Visit: Payer: Self-pay

## 2024-01-09 ENCOUNTER — Other Ambulatory Visit (HOSPITAL_BASED_OUTPATIENT_CLINIC_OR_DEPARTMENT_OTHER): Payer: Self-pay

## 2024-01-11 ENCOUNTER — Encounter: Payer: Self-pay | Admitting: Hematology and Oncology

## 2024-01-11 ENCOUNTER — Other Ambulatory Visit (HOSPITAL_BASED_OUTPATIENT_CLINIC_OR_DEPARTMENT_OTHER): Payer: Self-pay

## 2024-01-12 ENCOUNTER — Other Ambulatory Visit (HOSPITAL_BASED_OUTPATIENT_CLINIC_OR_DEPARTMENT_OTHER): Payer: Self-pay

## 2024-01-15 ENCOUNTER — Ambulatory Visit: Attending: Adult Health

## 2024-01-15 VITALS — Wt 191.0 lb

## 2024-01-15 DIAGNOSIS — Z483 Aftercare following surgery for neoplasm: Secondary | ICD-10-CM | POA: Insufficient documentation

## 2024-01-15 MED FILL — Fosaprepitant Dimeglumine For IV Infusion 150 MG (Base Eq): INTRAVENOUS | Qty: 5 | Status: AC

## 2024-01-15 NOTE — Therapy (Signed)
 " OUTPATIENT PHYSICAL THERAPY SOZO SCREENING NOTE   Patient Name: Sabrina Woods MRN: [id number] DOB:1955-12-15, 69 y.o., female Today's Date: 01/15/2024  PCP: Knute Thersia Bitters, FNP REFERRING PROVIDER: Crawford Morna Pickle, NP   PT End of Session - 01/15/24 1610     Visit Number 13   # unchanged due to screen only   PT Start Time 1609    PT Stop Time 1613    PT Time Calculation (min) 4 min    Activity Tolerance Patient tolerated treatment well    Behavior During Therapy [facility] for tasks assessed/performed          Past Medical History:  Diagnosis Date   Acid reflux    Anxiety    Breast cancer (HCC)    Breast cancer (HCC) 2022   Depression    HISTORY OF   Fibroid    PONV (postoperative nausea and vomiting)    Smoker    Past Surgical History:  Procedure Laterality Date   CESAREAN SECTION     X 2   MASTECTOMY     MASTECTOMY WITH AXILLARY LYMPH NODE DISSECTION Right 08/04/2020   Procedure: RIGHT MASTECTOMY WITH RIGHT AXILLARY LYMPH NODE DISSECTION;  Surgeon: Ebbie Cough, MD;  Location: [facility];  Service: General;  Laterality: Right;   PORTACATH PLACEMENT Left 03/17/2020   Procedure: INSERTION PORT-A-CATH;  Surgeon: Ebbie Cough, MD;  Location: [facility];  Service: General;  Laterality: Left;   PORTACATH PLACEMENT Left 08/05/2021   Procedure: PORT PLACEMENT WITH ULTRASOUND GUIDANCE;  Surgeon: Ebbie Cough, MD;  Location: [facility];  Service: General;  Laterality: Left;  LMA   Patient Active Problem List   Diagnosis Date Noted   Aortic atherosclerosis 06/20/2023   Encounter to establish care with new doctor 07/14/2022   Screening for colon cancer 07/14/2022   Encounter for smoking cessation counseling 07/14/2022   Essential (primary) hypertension 01/19/2022   Type 2 diabetes mellitus without complication, without long-term current use of insulin (HCC) 01/19/2022   Fatigue 04/08/2021   S/P mastectomy, right  08/04/2020   Chemotherapy-induced peripheral neuropathy 05/20/2020   Malignant neoplasm of upper-outer quadrant of right breast in female, estrogen receptor positive (HCC) 03/10/2020   Fibroid    Smoker    Depression     REFERRING DIAG: right breast cancer at risk for lymphedema  THERAPY DIAG:  Aftercare following surgery for neoplasm  PERTINENT HISTORY: R breast cancer s/p R mastectomy on 08/04/20 with 2 R positive axillary nodes, high grade DCIS. Has completed chemo as of Oct 2022 and radiation 11/04/20  PRECAUTIONS: right UE Lymphedema risk, None  SUBJECTIVE: Pt returns for her 6 month L-Dex screen.   PAIN:  Are you having pain? No  SOZO SCREENING: Patient was assessed today using the SOZO machine to determine the lymphedema index score. This was compared to her baseline score. It was determined that she is within the recommended range when compared to her baseline and no further action is needed at this time. She will continue SOZO screenings. These are done every 3 months for 2 years post operatively followed by every 6 months for 2 years, and then annually.   L-DEX FLOWSHEETS - 01/15/24 1600       L-DEX LYMPHEDEMA SCREENING   Measurement Type Unilateral    L-DEX MEASUREMENT EXTREMITY Upper Extremity    POSITION  Standing    DOMINANT SIDE Right    At Risk Side Right    BASELINE SCORE (UNILATERAL) -3.5  L-DEX SCORE (UNILATERAL) 0.3    VALUE CHANGE (UNILAT) 3.8         P: Cont every 6 months L-Dex screens until 11/2024, then transition to annual.    Aden Berwyn Caldron, PTA 01/15/2024, 4:12 PM    "

## 2024-01-16 ENCOUNTER — Inpatient Hospital Stay: Attending: Hematology and Oncology

## 2024-01-16 ENCOUNTER — Inpatient Hospital Stay (HOSPITAL_BASED_OUTPATIENT_CLINIC_OR_DEPARTMENT_OTHER): Admitting: Hematology and Oncology

## 2024-01-16 ENCOUNTER — Inpatient Hospital Stay

## 2024-01-16 ENCOUNTER — Other Ambulatory Visit (HOSPITAL_COMMUNITY): Payer: Self-pay

## 2024-01-16 ENCOUNTER — Other Ambulatory Visit: Payer: Self-pay

## 2024-01-16 VITALS — BP 141/62 | HR 78 | Temp 97.6°F | Resp 16 | Wt 189.5 lb

## 2024-01-16 DIAGNOSIS — Z5112 Encounter for antineoplastic immunotherapy: Secondary | ICD-10-CM | POA: Insufficient documentation

## 2024-01-16 DIAGNOSIS — Z170 Estrogen receptor positive status [ER+]: Secondary | ICD-10-CM | POA: Insufficient documentation

## 2024-01-16 DIAGNOSIS — C50411 Malignant neoplasm of upper-outer quadrant of right female breast: Secondary | ICD-10-CM | POA: Diagnosis not present

## 2024-01-16 DIAGNOSIS — C773 Secondary and unspecified malignant neoplasm of axilla and upper limb lymph nodes: Secondary | ICD-10-CM | POA: Diagnosis not present

## 2024-01-16 DIAGNOSIS — G629 Polyneuropathy, unspecified: Secondary | ICD-10-CM | POA: Diagnosis not present

## 2024-01-16 LAB — CMP (CANCER CENTER ONLY)
ALT: 24 U/L (ref 0–44)
AST: 30 U/L (ref 15–41)
Albumin: 4.2 g/dL (ref 3.5–5.0)
Alkaline Phosphatase: 133 U/L — ABNORMAL HIGH (ref 38–126)
Anion gap: 12 (ref 5–15)
BUN: 10 mg/dL (ref 8–23)
CO2: 24 mmol/L (ref 22–32)
Calcium: 9.8 mg/dL (ref 8.9–10.3)
Chloride: 99 mmol/L (ref 98–111)
Creatinine: 0.71 mg/dL (ref 0.44–1.00)
GFR, Estimated: >60 mL/min
Glucose, Bld: 135 mg/dL — ABNORMAL HIGH (ref 70–99)
Potassium: 4 mmol/L (ref 3.5–5.1)
Sodium: 135 mmol/L (ref 135–145)
Total Bilirubin: 0.3 mg/dL (ref 0.0–1.2)
Total Protein: 7.3 g/dL (ref 6.5–8.1)

## 2024-01-16 LAB — CBC WITH DIFFERENTIAL (CANCER CENTER ONLY)
Abs Immature Granulocytes: 0.01 K/uL (ref 0.00–0.07)
Basophils Absolute: 0.1 K/uL (ref 0.0–0.1)
Basophils Relative: 1 %
Eosinophils Absolute: 0.2 K/uL (ref 0.0–0.5)
Eosinophils Relative: 2 %
HCT: 39.7 % (ref 36.0–46.0)
Hemoglobin: 13.7 g/dL (ref 12.0–15.0)
Immature Granulocytes: 0 %
Lymphocytes Relative: 18 %
Lymphs Abs: 1.4 K/uL (ref 0.7–4.0)
MCH: 31.1 pg (ref 26.0–34.0)
MCHC: 34.5 g/dL (ref 30.0–36.0)
MCV: 90.2 fL (ref 80.0–100.0)
Monocytes Absolute: 0.7 K/uL (ref 0.1–1.0)
Monocytes Relative: 9 %
Neutro Abs: 5.3 K/uL (ref 1.7–7.7)
Neutrophils Relative %: 70 %
Platelet Count: 195 K/uL (ref 150–400)
RBC: 4.4 MIL/uL (ref 3.87–5.11)
RDW: 13.2 % (ref 11.5–15.5)
WBC Count: 7.7 K/uL (ref 4.0–10.5)
nRBC: 0 % (ref 0.0–0.2)

## 2024-01-16 MED ORDER — SODIUM CHLORIDE 0.9 % IV SOLN
150.0000 mg | Freq: Once | INTRAVENOUS | Status: AC
  Administered 2024-01-16: 150 mg via INTRAVENOUS
  Filled 2024-01-16: qty 150
  Filled 2024-01-16: qty 5

## 2024-01-16 MED ORDER — DEXTROSE 5 % IV SOLN: Freq: Once | INTRAVENOUS | Status: AC

## 2024-01-16 MED ORDER — DEXAMETHASONE SOD PHOSPHATE PF 10 MG/ML IJ SOLN
INTRAMUSCULAR | Status: AC
  Filled 2024-01-16: qty 1

## 2024-01-16 MED ORDER — PALONOSETRON HCL INJECTION 0.25 MG/5ML
0.2500 mg | Freq: Once | INTRAVENOUS | Status: AC
  Administered 2024-01-16: 0.25 mg via INTRAVENOUS
  Filled 2024-01-16: qty 5

## 2024-01-16 MED ORDER — DEXAMETHASONE SODIUM PHOSPHATE 10 MG/ML IJ SOLN
10.0000 mg | Freq: Once | INTRAMUSCULAR | Status: AC
  Administered 2024-01-16: 10 mg via INTRAVENOUS

## 2024-01-16 MED ORDER — ACETAMINOPHEN 325 MG PO TABS
650.0000 mg | ORAL_TABLET | Freq: Once | ORAL | Status: AC
  Administered 2024-01-16: 650 mg via ORAL
  Filled 2024-01-16: qty 2

## 2024-01-16 MED ORDER — DIPHENHYDRAMINE HCL 25 MG PO CAPS
25.0000 mg | ORAL_CAPSULE | Freq: Once | ORAL | Status: AC
  Administered 2024-01-16: 25 mg via ORAL
  Filled 2024-01-16: qty 1

## 2024-01-16 MED ORDER — FAM-TRASTUZUMAB DERUXTECAN-NXKI CHEMO 100 MG IV SOLR
3.2000 mg/kg | Freq: Once | INTRAVENOUS | Status: AC
  Administered 2024-01-16: 300 mg via INTRAVENOUS
  Filled 2024-01-16: qty 15

## 2024-01-16 NOTE — Progress Notes (Signed)
 No auth needed for Enhertu , Aloxi , and Emend per Tara G.  Nasri Boakye Presnell, PharmD Oncology Infusion Pharmacist 01/16/2024 10:21 AM

## 2024-01-16 NOTE — Progress Notes (Signed)
 "  Patient Care Team: Caudle, Thersia Bitters, FNP as PCP - General (Family Medicine) Ebbie Cough, MD as Consulting Physician (General Surgery) Dewey Rush, MD as Consulting Physician (Radiation Oncology) Odean Potts, MD as Consulting Physician (Hematology and Oncology)  DIAGNOSIS:  Encounter Diagnosis  Name Primary?   Malignant neoplasm of upper-outer quadrant of right breast in female, estrogen receptor positive (HCC) Yes    SUMMARY OF ONCOLOGIC HISTORY: Oncology History  Malignant neoplasm of upper-outer quadrant of right breast in female, estrogen receptor positive (HCC)  03/10/2020 Initial Diagnosis   Palpable right breast mass x6 months.  Mammogram revealed left breast cysts (intraductal papilloma), 11 cm right breast mass and 2 masses 2.6 cm each in the right axilla: Biopsy grade 3 IDC ER 5% weak, PR 0%, Ki-67 25%, HER-2 3+ IHC positive   03/10/2020 Cancer Staging   Staging form: Breast, AJCC 8th Edition - Clinical stage from 03/10/2020: Stage IIIA (cT3, cN1, cM0, G3, ER+, PR-, HER2+) - Signed by Odean Potts, MD on 03/10/2020 Stage prefix: Initial diagnosis   03/19/2020 - 07/01/2020 Chemotherapy   TCH Perjeta    08/04/2020 Surgery   Right mastectomy (Dr. 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, high-grade DCIS with necrosis and calcifications, and 2 right axillary lymph nodes positive for metastatic carcinoma   08/04/2020 Cancer Staging   Staging form: Breast, AJCC 8th Edition - Pathologic stage from 08/04/2020: No Stage Recommended (ypTis (DCIS), pN2, cM0) - Signed by Crawford Morna Pickle, NP on 02/05/2021 Stage prefix: Post-therapy   08/21/2020 - 10/23/2020 Chemotherapy   Discontinued due to peripheral neuropathy Patient is on Treatment Plan : BREAST ADO-Trastuzumab Emtansine  (Kadcyla ) q21d      08/2020 -  Anti-estrogen oral therapy    Decided not to do antiestrogen therapy because her ER was 0% on final path.   10/06/2020  - 11/04/2020 Radiation Therapy   Adjuvant radiation    Relapse/Recurrence   Right mastectomy area skin changes: Initially treated for shingles but since lesions did not improve skin biopsy was obtained on 07/14/2021: Infiltrating carcinoma consistent with breast origin CK7 and GA TA 3 positive, ER 0%, PR 0%, HER2 2+ by IHC, FISH negative ratio 1.56, copy #2.1   07/26/2021 -  Chemotherapy   Patient is on Treatment Plan : BREAST METASTATIC Fam-Trastuzumab Deruxtecan-nxki  (Enhertu ) (5.4) q21d     01/14/2022 Imaging   CT Chest: Slight decrease in size of the right lower lobe pulmonary nodule. Stable tiny non solid nodule in the superior segment of left lower lobe. No new pulmonary nodules. Stable surgical changes from right mastectomy and axillary lymph node dissection. No new developing lymph node enlargement or other mass lesion.  Chest port. Fatty liver infiltration. Aortic Atherosclerosis (ICD10-I70.0).     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  Recent imaging shows stable cutaneous metastases, unchanged pulmonary nodules, and an indeterminate liver lesion without correlating new or worsening symptoms.  She tolerates Enhertu  with 2-3 days of post-infusion fatigue, intermittent decreased appetite, and nausea, managed with oral hydration. She denies other acute symptoms or treatment-related complications.  She remains highly active, walking 2-2.5 hours daily and feels physically and mentally prepared for an upcoming hiking trip. Recent labs are stable and within normal limits. She has no new concerns about diabetes,  neuropathy, or new toxicities.       ALLERGIES:  has no known allergies.  MEDICATIONS:  Current  Outpatient Medications  Medication Sig Dispense Refill   ALPRAZolam  (XANAX ) 0.25 MG tablet Take 1 tablet (0.25 mg total) by mouth 2 (two) times daily as needed for anxiety. 60 tablet 3   amLODipine  (NORVASC ) 5 MG tablet Take 1 tablet (5 mg total) by mouth daily. 90 tablet 3   carvedilol  (COREG ) 3.125 MG tablet Take 1 tablet (3.125 mg total) by mouth 2 (two) times daily. 180 tablet 3   esomeprazole (NEXIUM) 20 MG capsule Take 20 mg by mouth daily.     gabapentin  (NEURONTIN ) 600 MG tablet Take 1 tablet (600 mg total) by mouth 3 (three) times daily. 270 tablet 3   GNP MAGNESIUM GLYCINATE PO Take 200 mg by mouth.     lidocaine -prilocaine  (EMLA ) cream Apply topically once.     ondansetron  (ZOFRAN ) 8 MG tablet Take 1 tablet (8 mg total) by mouth every 8 (eight) hours as needed for nausea. Take the 3rd day after chemo 30 tablet 3   OVER THE COUNTER MEDICATION Stool softener     prochlorperazine  (COMPAZINE ) 10 MG tablet Take 1 tablet (10 mg total) by mouth every 6 (six) hours as needed for nausea or vomiting. 30 tablet 3   tirzepatide  (MOUNJARO ) 5 MG/0.5ML Pen Inject 5 mg into the skin once a week. 6 mL 2   No current facility-administered medications for this visit.    PHYSICAL EXAMINATION: ECOG PERFORMANCE STATUS: 1 - Symptomatic but completely ambulatory  Vitals:   01/16/24 0944  BP: (!) 141/62  Pulse: 78  Resp: 16  Temp: 97.6 F (36.4 C)  SpO2: 100%   Filed Weights   01/16/24 0944  Weight: 189 lb 8 oz (86 kg)      LABORATORY DATA:  I have reviewed the data as listed    Latest Ref Rng & Units 12/05/2023    9:25 AM 10/24/2023   11:19 AM 09/12/2023   10:58 AM  CMP  Glucose 70 - 99 mg/dL 864  864  856   BUN 8 - 23 mg/dL 8  9  10    Creatinine 0.44 - 1.00 mg/dL 9.34  9.38  9.38   Sodium 135 - 145 mmol/L 135  135  139   Potassium 3.5 - 5.1 mmol/L 4.0  3.8  3.8   Chloride 98 - 111 mmol/L 102  104  107   CO2 22 - 32 mmol/L 24  26  26    Calcium  8.9 - 10.3 mg/dL 9.5  9.5  9.5   Total  Protein 6.5 - 8.1 g/dL 7.1  6.9  7.2   Total Bilirubin 0.0 - 1.2 mg/dL 0.4  0.4  0.3   Alkaline Phos 38 - 126 U/L 129  117  121   AST 15 - 41 U/L 31  21  23    ALT 0 - 44 U/L 24  19  22      Lab Results  Component Value Date   WBC 7.7 01/16/2024   HGB 13.7 01/16/2024   HCT 39.7 01/16/2024   MCV 90.2 01/16/2024   PLT 195 01/16/2024   NEUTROABS 5.3 01/16/2024    ASSESSMENT & PLAN:  Malignant neoplasm of upper-outer quadrant of right breast in female, estrogen receptor positive (HCC) 08/04/20: Right mastectomy (Dr. 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, high-grade DCIS with necrosis and calcifications, and 2 right axillary lymph nodes positive for metastatic  carcinoma   Treatment Plan: 1. Neoadjuvant chemotherapy with TCH Perjeta  6 cycles followed by Kadcyla  maintenance discontinued 11/11/2020 because of peripheral neuropathy 2. Followed by mastectomy with sentinel lymph node study 08/04/20 3. Followed by adjuvant radiation therapy 09/18/2020- 11/04/2020 4.  Decided not to do antiestrogen therapy because her ER was 0% on final path. 5.  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  Current treatment: Enhertu  ----------------------------------------------------------------------------------------------------------------- Right mastectomy area skin changes: Initially treated for shingles but since lesions did not improve skin biopsy was obtained on 07/14/2021: Infiltrating carcinoma consistent with breast origin CK7 and GA TA 3 positive, ER 0%, PR 0%, HER2 2+ by IHC, FISH negative ratio 1.56, copy #2.1   CT CAP 08/03/2021: Solid 0.8 cm right lower lobe lung nodule. Bone scan 08/03/2021: No definite evidence of metastatic disease  CT chest on July 21, 2022 demonstrating no evidence of metastatic disease progression . CT CAP 11/11/2022: Small groundglass nodules in the lungs unchanged hepatic steatosis.  No evidence of metastatic disease. CT CAP 05/02/2023:  Occasional small pulmonary nodules unchanged most likely benign, no lymphadenopathy, hepatic steatosis, prominent left uterine and adnexal varices.  No metastatic disease CT CAP 11/28/2023: Increased conspicuity of 10 mm hypodensity liver indeterminate, unchanged ground glass pulmonary nodules measuring 6 mm, mild splenomegaly   Current treatment: Enhertu  (adjusted to every 5 weeks from 11/11/2022), cycle 33 Enhertu  toxicities:  1. Fatigue that lasted for 3-5 days 2. Nausea: Much improved 3. Insomnia 4. Neuropathy: stable most prominent of the lateral aspects of her ring finger and index fingers 5. Hair thinning 6. Diabetes: On Ozempic , hemoglobin A1c 6.3     No orders of the defined types were placed in this encounter.  The patient has a good understanding of the overall plan. she agrees with it. she will call with any problems that may develop before the next visit here.  I personally spent a total of 30 minutes in the care of the patient today including preparing to see the patient, getting/reviewing separately obtained history, performing a medically appropriate exam/evaluation, counseling and educating, placing orders, referring and communicating with other health care professionals, documenting clinical information in the EHR, independently interpreting results, communicating results, and coordinating care.   Viinay K Forrest Jaroszewski, MD 01/16/2024    "

## 2024-01-16 NOTE — Progress Notes (Signed)
 Decadron  overidden from pyxis due to malfunction. Confirmed with Andrea Plunk

## 2024-01-16 NOTE — Patient Instructions (Signed)
 [facility] - A DEPT OF [facility]  Discharge Instructions: Thank you for choosing [facility] to provide your oncology and hematology care.   If you have a lab appointment with the Cancer Center, please go directly to the Cancer Center and check in at the registration area.   Wear comfortable clothing and clothing appropriate for easy access to any Portacath or PICC line.   We strive to give you quality time with your provider. You may need to reschedule your appointment if you arrive late (15 or more minutes).  Arriving late affects you and other patients whose appointments are after yours.  Also, if you miss three or more appointments without notifying the office, you may be dismissed from the clinic at the provider's discretion.      For prescription refill requests, have your pharmacy contact our office and allow 72 hours for refills to be completed.    Today you received the following chemotherapy and/or immunotherapy agents: Fam-trastuzumab deruxtecan-nxki  (Enhertu )    To help prevent nausea and vomiting after your treatment, we encourage you to take your nausea medication as directed.  BELOW ARE SYMPTOMS THAT SHOULD BE REPORTED IMMEDIATELY: *FEVER GREATER THAN 100.4 F (38 C) OR HIGHER *CHILLS OR SWEATING *NAUSEA AND VOMITING THAT IS NOT CONTROLLED WITH YOUR NAUSEA MEDICATION *UNUSUAL SHORTNESS OF BREATH *UNUSUAL BRUISING OR BLEEDING *URINARY PROBLEMS (pain or burning when urinating, or frequent urination) *BOWEL PROBLEMS (unusual diarrhea, constipation, pain near the anus) TENDERNESS IN MOUTH AND THROAT WITH OR WITHOUT PRESENCE OF ULCERS (sore throat, sores in mouth, or a toothache) UNUSUAL RASH, SWELLING OR PAIN  UNUSUAL VAGINAL DISCHARGE OR ITCHING   Items with * indicate a potential emergency and should be followed up as soon as possible or go to the Emergency Department if any problems should occur.  Please show the  CHEMOTHERAPY ALERT CARD or IMMUNOTHERAPY ALERT CARD at check-in to the Emergency Department and triage nurse.  Should you have questions after your visit or need to cancel or reschedule your appointment, please contact [facility] - A DEPT OF [facility]  Dept: [phone]  and follow the prompts.  Office hours are 8:00 a.m. to 4:30 p.m. Monday - Friday. Please note that voicemails left after 4:00 p.m. may not be returned until the following business day.  We are closed weekends and major holidays. You have access to a nurse at all times for urgent questions. Please call the main number to the clinic Dept: [phone] and follow the prompts.   For any non-urgent questions, you may also contact your provider using MyChart. We now offer e-Visits for anyone 43 and older to request care online for non-urgent symptoms. For details visit [email or web address].   Also download the MyChart app! Go to the app store, search MyChart, open the app, select [facility], and log in with your MyChart username and password.

## 2024-01-16 NOTE — Assessment & Plan Note (Signed)
 08/04/20: Right mastectomy (Dr. 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 Neoadjuvant chemotherapy with TCH Perjeta  6 cycles followed by Kadcyla  maintenance discontinued 11/11/2020 because of peripheral neuropathy 2. Followed by mastectomy with sentinel lymph node study 08/04/20 3. Followed by adjuvant radiation therapy 09/18/2020- 11/04/2020 4.  Decided not to do antiestrogen therapy because her ER was 0% on final path. 5.  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  Current treatment: Enhertu  ----------------------------------------------------------------------------------------------------------------- Right mastectomy area skin changes: Initially treated for shingles but since lesions did not improve skin biopsy was obtained on 07/14/2021: Infiltrating carcinoma consistent with breast origin CK7 and GA TA 3 positive, ER 0%, PR 0%, HER2 2+ by IHC, FISH negative ratio 1.56, copy #2.1   CT CAP 08/03/2021: Solid 0.8 cm right lower lobe lung nodule. Bone scan 08/03/2021: No definite evidence of metastatic disease  CT chest on July 21, 2022 demonstrating no evidence of metastatic disease progression . CT CAP 11/11/2022: Small groundglass nodules in the lungs unchanged hepatic steatosis.  No evidence of metastatic disease. CT CAP 05/02/2023: Occasional small pulmonary nodules unchanged most likely benign, no lymphadenopathy, hepatic steatosis, prominent left uterine and adnexal varices.  No metastatic disease CT CAP 11/28/2023: Increased conspicuity of 10 mm hypodensity liver indeterminate, unchanged ground glass pulmonary nodules measuring 6 mm, mild splenomegaly   Current treatment: Enhertu  (adjusted to every 5 weeks from 11/11/2022), cycle 32 Enhertu  toxicities:  1. Fatigue that lasted for 3-5 days 2. Nausea: Much improved 3.  Insomnia 4. Neuropathy: stable most prominent of the lateral aspects of her ring finger and index fingers 5. Hair thinning 6. Diabetes: On Ozempic , hemoglobin A1c 6.3

## 2024-01-17 ENCOUNTER — Telehealth: Payer: Self-pay | Admitting: *Deleted

## 2024-01-17 ENCOUNTER — Telehealth (HOSPITAL_BASED_OUTPATIENT_CLINIC_OR_DEPARTMENT_OTHER): Payer: Self-pay | Admitting: Pharmacy Technician

## 2024-01-17 ENCOUNTER — Other Ambulatory Visit (HOSPITAL_COMMUNITY): Payer: Self-pay

## 2024-01-17 NOTE — Telephone Encounter (Signed)
 Routing to prior auth pool for review.

## 2024-01-17 NOTE — Telephone Encounter (Signed)
 Pharmacy Patient Advocate Encounter   Received notification from Pt Calls Messages that prior authorization for Mounjaro  5mg /0.32ml auto-injectors is required/requested.   Insurance verification completed.   The patient is insured through [employer].   Per test claim: PA required; PA submitted to above mentioned insurance via Phone Key/confirmation #/EOC [id number] Status is pending  Patient called and open up her own case on 01/15/2023. We tried to call and complete it today however, the plan has already denied it. Appeal opened via phone today. I was advised that it could take up to 7 business days for the appeal to be completed.   Phone number for insurance is [phone] to check status.

## 2024-01-17 NOTE — Telephone Encounter (Signed)
 Her insurance has denied her mounjaro . Please call back to explain what her next steps should be.

## 2024-01-17 NOTE — Telephone Encounter (Signed)
 Copied from CRM [id number]. Topic: Clinical - Medication Prior Auth >> Jan 17, 2024  1:09 PM Delon DASEN wrote: Reason for CRM: tirzepatide  (MOUNJARO ) 5 MG/0.5ML Pen- PA- [name] with [employer] calling with clinical questions

## 2024-01-18 NOTE — Telephone Encounter (Signed)
 Copied from CRM [id number]. Topic: Clinical - Medication Prior Auth >> Jan 17, 2024  1:09 PM Delon T wrote: Reason for CRM: tirzepatide  (MOUNJARO ) 5 MG/0.5ML Pen- PA- [name] with [employer] calling with clinical questions >> Jan 18, 2024  1:41 PM [name] wrote: Patient states the provider completed a form for her Mounjaro , but her insurance denied the request.. [facility] ([facility]) reported that an appeal has been opened by phone, and it may take up to 7 business days for a determination. Patient is currently out of Mounjaro  and is asking whether she can receive a sample or what other options may be available. Patient can be reached at [phone]

## 2024-01-19 ENCOUNTER — Other Ambulatory Visit (HOSPITAL_BASED_OUTPATIENT_CLINIC_OR_DEPARTMENT_OTHER): Payer: Self-pay

## 2024-01-23 ENCOUNTER — Other Ambulatory Visit (HOSPITAL_COMMUNITY): Payer: Self-pay

## 2024-01-23 NOTE — Telephone Encounter (Signed)
 Pharmacy Patient Advocate Encounter  Received notification from [employer] that Prior Authorization for Mounjaro  5mg  has been APPROVED from 12/16/23 to 01/17/25 Already filled 01/11/24   PA #/Case ID/Reference #: [id number]

## 2024-01-26 ENCOUNTER — Ambulatory Visit (HOSPITAL_COMMUNITY)
Admission: RE | Admit: 2024-01-26 | Discharge: 2024-01-26 | Disposition: A | Source: Ambulatory Visit | Attending: Hematology and Oncology

## 2024-01-26 DIAGNOSIS — I358 Other nonrheumatic aortic valve disorders: Secondary | ICD-10-CM | POA: Insufficient documentation

## 2024-01-26 DIAGNOSIS — Z79899 Other long term (current) drug therapy: Secondary | ICD-10-CM | POA: Diagnosis not present

## 2024-01-26 DIAGNOSIS — Z5181 Encounter for therapeutic drug level monitoring: Secondary | ICD-10-CM | POA: Diagnosis present

## 2024-01-28 LAB — ECHOCARDIOGRAM COMPLETE
Area-P 1/2: 5.5 cm2
Calc EF: 76.4 %
S' Lateral: 2.9 cm
Single Plane A2C EF: 82.2 %
Single Plane A4C EF: 69.1 %

## 2024-02-27 ENCOUNTER — Inpatient Hospital Stay

## 2024-02-27 ENCOUNTER — Inpatient Hospital Stay: Admitting: Hematology and Oncology

## 2024-04-09 ENCOUNTER — Inpatient Hospital Stay: Attending: Hematology and Oncology

## 2024-04-09 ENCOUNTER — Inpatient Hospital Stay

## 2024-04-09 ENCOUNTER — Inpatient Hospital Stay: Admitting: Hematology and Oncology

## 2024-07-15 ENCOUNTER — Ambulatory Visit: Attending: Adult Health
